# Patient Record
Sex: Male | Born: 1945 | Race: White | Hispanic: No | Marital: Married | Smoking: Never smoker
Health system: Southern US, Community
[De-identification: ages and names within clinical notes are randomized; demographics above are authoritative.]

## PROBLEM LIST (undated history)

## (undated) DIAGNOSIS — C61 Malignant neoplasm of prostate: Secondary | ICD-10-CM

## (undated) DIAGNOSIS — I10 Essential (primary) hypertension: Secondary | ICD-10-CM

## (undated) DIAGNOSIS — C7951 Secondary malignant neoplasm of bone: Principal | ICD-10-CM

## (undated) DIAGNOSIS — E119 Type 2 diabetes mellitus without complications: Secondary | ICD-10-CM

## (undated) DIAGNOSIS — G4730 Sleep apnea, unspecified: Secondary | ICD-10-CM

## (undated) DIAGNOSIS — E079 Disorder of thyroid, unspecified: Secondary | ICD-10-CM

## (undated) HISTORY — PX: CATARACT EXTRACTION: SUR2

## (undated) HISTORY — DX: Malignant neoplasm of prostate: C61

## (undated) HISTORY — DX: Essential (primary) hypertension: I10

## (undated) HISTORY — PX: REPLACEMENT TOTAL KNEE: SUR1224

## (undated) HISTORY — DX: Secondary malignant neoplasm of bone: C79.51

## (undated) HISTORY — DX: Type 2 diabetes mellitus without complications: E11.9

## (undated) HISTORY — DX: Sleep apnea, unspecified: G47.30

## (undated) HISTORY — DX: Disorder of thyroid, unspecified: E07.9

---

## 2010-09-27 ENCOUNTER — Encounter (HOSPITAL_COMMUNITY): Payer: BC Managed Care – PPO

## 2010-09-27 ENCOUNTER — Other Ambulatory Visit: Payer: Self-pay | Admitting: Specialist

## 2010-09-27 ENCOUNTER — Other Ambulatory Visit (HOSPITAL_COMMUNITY): Payer: Self-pay | Admitting: Specialist

## 2010-09-27 ENCOUNTER — Ambulatory Visit (HOSPITAL_COMMUNITY)
Admission: RE | Admit: 2010-09-27 | Discharge: 2010-09-27 | Disposition: A | Discharge status: Home / Self Care | Payer: BC Managed Care – PPO | Source: Ambulatory Visit | Attending: Specialist | Admitting: Specialist

## 2010-09-27 DIAGNOSIS — Z01811 Encounter for preprocedural respiratory examination: Secondary | ICD-10-CM

## 2010-09-27 DIAGNOSIS — Z01818 Encounter for other preprocedural examination: Secondary | ICD-10-CM | POA: Insufficient documentation

## 2010-09-27 DIAGNOSIS — I517 Cardiomegaly: Secondary | ICD-10-CM | POA: Insufficient documentation

## 2010-09-27 DIAGNOSIS — Z01810 Encounter for preprocedural cardiovascular examination: Secondary | ICD-10-CM | POA: Insufficient documentation

## 2010-09-27 DIAGNOSIS — Z01812 Encounter for preprocedural laboratory examination: Secondary | ICD-10-CM | POA: Insufficient documentation

## 2010-09-27 DIAGNOSIS — I498 Other specified cardiac arrhythmias: Secondary | ICD-10-CM | POA: Insufficient documentation

## 2010-09-27 DIAGNOSIS — I10 Essential (primary) hypertension: Secondary | ICD-10-CM | POA: Insufficient documentation

## 2010-09-27 LAB — COMPREHENSIVE METABOLIC PANEL
ALT: 19 U/L (ref 0–53)
Albumin: 4 g/dL (ref 3.5–5.2)
Alkaline Phosphatase: 89 U/L (ref 39–117)
BUN: 17 mg/dL (ref 6–23)
Potassium: 3.5 mEq/L (ref 3.5–5.1)
Sodium: 138 mEq/L (ref 135–145)
Total Protein: 7.2 g/dL (ref 6.0–8.3)

## 2010-09-27 LAB — DIFFERENTIAL
Basophils Relative: 1 % (ref 0–1)
Lymphocytes Relative: 29 % (ref 12–46)
Monocytes Absolute: 0.4 10*3/uL (ref 0.1–1.0)
Monocytes Relative: 6 % (ref 3–12)
Neutro Abs: 4 10*3/uL (ref 1.7–7.7)

## 2010-09-27 LAB — URINALYSIS, ROUTINE W REFLEX MICROSCOPIC
Bilirubin Urine: NEGATIVE
Hgb urine dipstick: NEGATIVE
Specific Gravity, Urine: 1.016 (ref 1.005–1.030)
pH: 7.5 (ref 5.0–8.0)

## 2010-09-27 LAB — CBC
HCT: 44.5 % (ref 39.0–52.0)
Hemoglobin: 14.8 g/dL (ref 13.0–17.0)
MCHC: 33.3 g/dL (ref 30.0–36.0)

## 2010-09-27 LAB — SURGICAL PCR SCREEN: MRSA, PCR: NEGATIVE

## 2010-09-27 LAB — PROTIME-INR: INR: 1.04 (ref 0.00–1.49)

## 2010-10-10 ENCOUNTER — Inpatient Hospital Stay (HOSPITAL_COMMUNITY)
Admission: RE | Admit: 2010-10-10 | Discharge: 2010-10-13 | DRG: 209 | Disposition: A | Discharge status: Home Health | Payer: BC Managed Care – PPO | Source: Ambulatory Visit | Attending: Specialist | Admitting: Specialist

## 2010-10-10 ENCOUNTER — Inpatient Hospital Stay (HOSPITAL_COMMUNITY): Payer: BC Managed Care – PPO

## 2010-10-10 DIAGNOSIS — I10 Essential (primary) hypertension: Secondary | ICD-10-CM | POA: Diagnosis present

## 2010-10-10 DIAGNOSIS — G4733 Obstructive sleep apnea (adult) (pediatric): Secondary | ICD-10-CM | POA: Diagnosis present

## 2010-10-10 DIAGNOSIS — Z01812 Encounter for preprocedural laboratory examination: Secondary | ICD-10-CM

## 2010-10-10 DIAGNOSIS — M1710 Unilateral primary osteoarthritis, unspecified knee: Principal | ICD-10-CM | POA: Diagnosis present

## 2010-10-10 LAB — TYPE AND SCREEN
ABO/RH(D): A POS
Antibody Screen: NEGATIVE

## 2010-10-11 LAB — BASIC METABOLIC PANEL
Calcium: 9 mg/dL (ref 8.4–10.5)
GFR calc non Af Amer: >60 mL/min (ref >60)
Glucose, Bld: 125 mg/dL — ABNORMAL HIGH (ref 70–99)
Sodium: 134 mEq/L — ABNORMAL LOW (ref 135–145)

## 2010-10-11 LAB — CBC
Hemoglobin: 14.5 g/dL (ref 13.0–17.0)
MCH: 31.1 pg (ref 26.0–34.0)
MCHC: 34.9 g/dL (ref 30.0–36.0)
RDW: 13 % (ref 11.5–15.5)

## 2010-10-12 LAB — CBC
MCH: 30.8 pg (ref 26.0–34.0)
MCHC: 34.2 g/dL (ref 30.0–36.0)
Platelets: 168 10*3/uL (ref 150–400)
RBC: 4.39 MIL/uL (ref 4.22–5.81)

## 2010-10-12 LAB — BASIC METABOLIC PANEL
Calcium: 9.3 mg/dL (ref 8.4–10.5)
GFR calc non Af Amer: >60 mL/min (ref >60)
Sodium: 135 mEq/L (ref 135–145)

## 2010-10-12 LAB — GLUCOSE, CAPILLARY
Glucose-Capillary: 133 mg/dL — ABNORMAL HIGH (ref 70–99)
Glucose-Capillary: 159 mg/dL — ABNORMAL HIGH (ref 70–99)

## 2010-10-13 LAB — BASIC METABOLIC PANEL
Calcium: 9.7 mg/dL (ref 8.4–10.5)
GFR calc non Af Amer: >60 mL/min (ref >60)
Sodium: 137 mEq/L (ref 135–145)

## 2010-10-13 LAB — GLUCOSE, CAPILLARY: Glucose-Capillary: 130 mg/dL — ABNORMAL HIGH (ref 70–99)

## 2010-10-13 LAB — CBC
MCH: 31.5 pg (ref 26.0–34.0)
MCHC: 35.4 g/dL (ref 30.0–36.0)
Platelets: 177 10*3/uL (ref 150–400)
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
## 2010-10-15 NOTE — Op Note (Signed)
NAME:  Micheal Davis, Micheal Davis NO.:  [id number]  MEDICAL RECORD NO.:  [id number]  LOCATION:  1614                         FACILITY:  [facility]  PHYSICIAN:  Jene Every, M.D.    DATE OF BIRTH:  January 13, 1946  DATE OF PROCEDURE: DATE OF DISCHARGE:                              OPERATIVE REPORT   PREOPERATIVE DIAGNOSIS:  Degenerative joint disease of the left knee.  POSTOPERATIVE DIAGNOSIS:  Degenerative joint disease of the left knee.  PROCEDURE PERFORMED:  Left total knee arthroplasty.  ANESTHESIA:  General.  ASSISTANT:  Roma Schanz, PA  BRIEF HISTORY AND INDICATION:  This is a 65 year old with end-stage osteoarthritis of the knee, indicated for replacement of degenerated joint.  Risks and benefits were discussed, including bleeding, infection, damage to neurovascular structures, no change in symptoms, worsening symptoms, need for repeat debridement, DVT, PE, anesthetic complications, etc.  COMPONENTS UTILIZED:  DePuy rotating platform, 4 femur, 5 tibia, 10 mm insert, 38 patella.  TECHNIQUE:  With the patient in supine position, after induction of adequate general anesthesia, 2 g Kefzol, left lower extremity was prepped and draped and exsanguinated in usual sterile fashion.  Thigh tourniquet inflated to 300 mmHg.  Midline incision was made over the patella.  Median parapatellar arthrotomy was performed.  Full-thickness flaps were developed, patella everted, knee flexed, tricompartmental osteoarthrosis was noted.  Copious portions of clear synovial fluid were evacuated.  Osteophytes removed with rongeur.  Medial lateral menisci was removed.  We elevated soft tissues medially preserving the MCL.  I removed the remnant of the ACL.  Cauterized the geniculate, step drill utilized, anterior of the femoral canal was irrigated.  We chose a 12 mm dorsiflexion contracture, 5 degrees left, inserted at 10, and made a distal femoral cut without difficulty.  Soft tissues  protected throughout the case.  Attention turned towards the tibia, subluxed, McHale was utilized.  External alignment guide utilized bisecting the ankle parallel to the tibial shaft, 10 mm off the high side which was too off the low side which was medial.  This was then pinned, oscillating saw performed the tibial cut.  Soft tissues protected posteriorly at all times.  Next, we trialed a flexion and extension gaps and they were equivalent.  Attention was turned towards the bleeding, the tibia was subluxed.  McHale retractors placed, a 5 was better coverage than the 4 on the medial side of the tibial tubercle.  That was optimized the coverage, 10 central drill and punch guide utilized, this was then preserved.  Attention was turned towards the femur, completed the box cut with a box jig bisecting the canal.  __________ which was then thinned, box cut performed.  A trial femur was then impacted, the 10 mm insert full extension, full flexion, good stability with varus- valgus stressing at 0 to 30 degrees, patella was then prepared, everted, measured, 27 to 10 off the patella measured 38, left at 17 use of patellar clamp.  Oscillating saw performed with this, then we medialized with 3 peg holes, drilled them, placed a trial patella, and trialed the patella with good patellofemoral tracking, good stability.  Next, all of instrumentation was removed.  We checked posteriorly, removed any osteophytes.  A loose body was achieved as well, cauterized the geniculate.  Popliteus was intact, as was the capsule.  Copiously irrigated with pulsatile lavage.  Knee was flexed, patella everted using McHale.  Dried the surface of the tibia, mixed cement at the back table in appropriate fashion, injected under pressure in the tibial canal digitally pressurized the proximal tibia, and then impacted the 5 tibia. Redundant cement was removed.  We then dried and then cemented the femur, impacted the femoral  component.  Redundant cement removed.  We inserted 10 mm insert to reduce the knee and held this in extension with an axial load applied throughout the curing of the cement.  Redundant cement removed.  Cemented the patella as well with using a clamp of the patella.  Redundant cement removed.  After full curing of the cement, it was trialed, flexion extension, full extension, full flexion, good stability, varus-valgus stressing at 0 and 30 degrees.  Negative anterior drawer at the patellofemoral tracking with the 10 trial. Inspected the joint, removed all redundant cement, copiously irrigated. Placed 10 mm insert, reduced it again, good stability, full range of motion 0-140 with varus-valgus stressing at 0 and 30 degrees. Patellofemoral tracking was satisfactory.  Next, injected Marcaine with epinephrine into periosteum.  We placed a Hemovac, brought out through the lateral stab wound of the skin.  Repaired the patellar arthrotomy of the knee with slight flexion 30 degrees with #1 Vicryl interrupted figure-of-8 sutures, subcu with 0 and 2-0 Vicryl __________ , skin was reapproximated with staples.  Wound was dressed sterilely, tourniquet was deflated with adequate vascularization of lower extremities appreciated.  The patient tolerated the procedure well.  No complications such as increasing stridor.  Tourniquet time was around hour and 45 minutes.  We used a bone wax for cancellous surfaces as well.     Jene Every, M.D.     Cordelia Pen  D:  10/10/2010  T:  10/10/2010  Job:  [id number]  Electronically Signed by Jene Every M.D. on 10/15/2010 01:24:34 PM

## 2010-11-01 NOTE — Discharge Summary (Signed)
NAME:  Micheal Davis, Micheal Davis NO.:  [id number]  MEDICAL RECORD NO.:  [id number]  LOCATION:  1614                         FACILITY:  [facility]  PHYSICIAN:  Jene Every, M.D.    DATE OF BIRTH:  01-18-1946  DATE OF ADMISSION:  10/10/2010 DATE OF DISCHARGE:  10/13/2010                              DISCHARGE SUMMARY   ADMISSION DIAGNOSES: 1. Degenerative joint disease, left knee. 2. Hypertension. 3. Sleep apnea. 4. History of kidney stones. 5. History of questionable benign prostatic hypertrophy.  DISCHARGE DIAGNOSES: 1. Degenerative joint disease, left knee. 2. Hypertension. 3. Sleep apnea. 4. History of kidney stones. 5. History of questionable benign prostatic hypertrophy. 6. Status post left total knee arthroplasty.  HISTORY:  Micheal Davis is a pleasant 65 year old gentleman with a known history of end-stage osteoarthritis of knees who has been treated conservatively with injections, viscosupplementation, cortisone, as well as antiinflammatories.  He has noted significant progression of his symptoms with difficulty weightbearing.  X-rays do show end-stage changes of the medial compartment.  Exam does show loss of range of motion as well as some instability on varus stressing.  At this point, it was felt the patient would benefit from a total knee arthroplasty. Risks and benefits of the surgery were discussed with the patient.  He does elect to proceed.  PROCEDURE:  The patient was taken to the OR and underwent left total knee arthroplasty.  SURGEON:  Jene Every, M.D.  ASSISTANT:  Roma Schanz, P.A-C.  ANESTHESIA:  General.  COMPLICATIONS:  None.  CONSULTS:  PT, OT, and Care Management.  LABORATORY DATA ON ADMISSION:  White cell count 10.1, hemoglobin 14.5, and hematocrit 41.6.  This was monitored throughout the hospital stay, slightly elevated WBC at 10.7, hemoglobin remained stable at 14.1, and hematocrit at 39.8.  Routine chemistries showed  sodium 134, potassium 4.4, normal BUN and creatinine, slightly elevated glucose at 125. Sodium remained stable throughout the hospital course.  Slight decrease in potassium 3.3 at the time of discharge.  BUN 13, creatinine 0.84, slightly elevated glucose at 128.  GFR is greater than 60, blood type is A+.  No preoperative chest x-ray or scans in the chart.  HOSPITAL COURSE:  The patient was admitted, taken to the OR, and underwent the above-stated procedure without difficulty.  He was then transferred to the PACU and admitted to the orthopedic floor  for continued postoperative care.  One Hemovac drain was placed intraoperatively.  He was placed on PCA for analgesic relief.  On postoperative day #1, the patient was doing fairly well.  Vital signs were stable.  He was afebrile.  Hemovac drain was discontinued.  His glucose was slightly elevated.  Dr. Shelle Iron recommended a nutrition consult, CPM was started as well as Xarelto for DVT prophylaxis.  We continued with elevation as well as TED hose and PAS.  Postoperative day #2, the patient continued do fairly well, noted pain in the knee, as expected, was controlled on p.o. analgesics.  He was voiding, passing flatus without difficulty.  Vital signs were stable and he was afebrile. Dressing was changed.  There was some mild edema into the lower extremity, otherwise, motor and neurovascular function are intact. Calves  soft and nontender.  PT/OT was continued.  Discharge planning was initiated.  Postop day #3,patient was doing well at this time, the  patient was stable to be discharged home.  DISPOSITION:  The patient discharged home with all home health needs met.  ACTIVITY:  He is to elevate his leg  at least 6 times a day for 20 minutes at a time.  Ambulate as tolerated.  Use knee immobilizer until he can straight leg raise x10.  MEDICATIONS:  As per med rec sheet.  He will need  to be on Xarelto for a total of 21 days.  DIET:  As  tolerated.  CONDITION ON DISCHARGE:  Stable.  FINAL DIAGNOSIS:  Doing well status post left total knee arthroplasty.     Roma Schanz, P.A.   ______________________________ Jene Every, M.D.    CS/MEDQ  D:  10/30/2010  T:  10/31/2010  Job:  [id number]  Electronically Signed by Roma Schanz P.A. on 11/01/2010 12:13:32 PM Electronically Signed by Jene Every M.D. on 11/01/2010 02:42:01 PM

## 2011-03-09 DIAGNOSIS — H66009 Acute suppurative otitis media without spontaneous rupture of ear drum, unspecified ear: Secondary | ICD-10-CM | POA: Diagnosis not present

## 2011-03-10 DIAGNOSIS — M1710 Unilateral primary osteoarthritis, unspecified knee: Secondary | ICD-10-CM | POA: Diagnosis not present

## 2011-03-16 DIAGNOSIS — H66009 Acute suppurative otitis media without spontaneous rupture of ear drum, unspecified ear: Secondary | ICD-10-CM | POA: Diagnosis not present

## 2011-03-16 DIAGNOSIS — R42 Dizziness and giddiness: Secondary | ICD-10-CM | POA: Diagnosis not present

## 2011-03-16 DIAGNOSIS — H6500 Acute serous otitis media, unspecified ear: Secondary | ICD-10-CM | POA: Diagnosis not present

## 2011-06-06 DIAGNOSIS — M20009 Unspecified deformity of unspecified finger(s): Secondary | ICD-10-CM | POA: Diagnosis not present

## 2011-06-06 DIAGNOSIS — M1990 Unspecified osteoarthritis, unspecified site: Secondary | ICD-10-CM | POA: Diagnosis not present

## 2011-08-04 DIAGNOSIS — E7800 Pure hypercholesterolemia, unspecified: Secondary | ICD-10-CM | POA: Diagnosis not present

## 2011-08-11 DIAGNOSIS — G4733 Obstructive sleep apnea (adult) (pediatric): Secondary | ICD-10-CM | POA: Diagnosis not present

## 2011-08-11 DIAGNOSIS — N400 Enlarged prostate without lower urinary tract symptoms: Secondary | ICD-10-CM | POA: Diagnosis not present

## 2011-08-11 DIAGNOSIS — M1990 Unspecified osteoarthritis, unspecified site: Secondary | ICD-10-CM | POA: Diagnosis not present

## 2011-08-11 DIAGNOSIS — R972 Elevated prostate specific antigen [PSA]: Secondary | ICD-10-CM | POA: Diagnosis not present

## 2011-08-11 DIAGNOSIS — R7301 Impaired fasting glucose: Secondary | ICD-10-CM | POA: Diagnosis not present

## 2011-08-11 DIAGNOSIS — E039 Hypothyroidism, unspecified: Secondary | ICD-10-CM | POA: Diagnosis not present

## 2011-08-11 DIAGNOSIS — E782 Mixed hyperlipidemia: Secondary | ICD-10-CM | POA: Diagnosis not present

## 2011-08-11 DIAGNOSIS — I10 Essential (primary) hypertension: Secondary | ICD-10-CM | POA: Diagnosis not present

## 2011-09-18 DIAGNOSIS — M1710 Unilateral primary osteoarthritis, unspecified knee: Secondary | ICD-10-CM | POA: Diagnosis not present

## 2011-10-12 DIAGNOSIS — R070 Pain in throat: Secondary | ICD-10-CM | POA: Diagnosis not present

## 2011-10-12 DIAGNOSIS — J019 Acute sinusitis, unspecified: Secondary | ICD-10-CM | POA: Diagnosis not present

## 2011-10-12 DIAGNOSIS — L02818 Cutaneous abscess of other sites: Secondary | ICD-10-CM | POA: Diagnosis not present

## 2012-01-21 DIAGNOSIS — E782 Mixed hyperlipidemia: Secondary | ICD-10-CM | POA: Diagnosis not present

## 2012-01-21 DIAGNOSIS — G252 Other specified forms of tremor: Secondary | ICD-10-CM | POA: Diagnosis not present

## 2012-01-21 DIAGNOSIS — R7301 Impaired fasting glucose: Secondary | ICD-10-CM | POA: Diagnosis not present

## 2012-01-21 DIAGNOSIS — H8110 Benign paroxysmal vertigo, unspecified ear: Secondary | ICD-10-CM | POA: Diagnosis not present

## 2012-01-21 DIAGNOSIS — N400 Enlarged prostate without lower urinary tract symptoms: Secondary | ICD-10-CM | POA: Diagnosis not present

## 2012-01-21 DIAGNOSIS — G250 Essential tremor: Secondary | ICD-10-CM | POA: Diagnosis not present

## 2012-01-21 DIAGNOSIS — E039 Hypothyroidism, unspecified: Secondary | ICD-10-CM | POA: Diagnosis not present

## 2012-01-21 DIAGNOSIS — C44319 Basal cell carcinoma of skin of other parts of face: Secondary | ICD-10-CM | POA: Diagnosis not present

## 2012-01-21 DIAGNOSIS — I10 Essential (primary) hypertension: Secondary | ICD-10-CM | POA: Diagnosis not present

## 2012-02-02 DIAGNOSIS — E782 Mixed hyperlipidemia: Secondary | ICD-10-CM | POA: Diagnosis not present

## 2012-02-09 DIAGNOSIS — E782 Mixed hyperlipidemia: Secondary | ICD-10-CM | POA: Diagnosis not present

## 2012-02-09 DIAGNOSIS — Z0000 Encounter for general adult medical examination without abnormal findings: Secondary | ICD-10-CM | POA: Diagnosis not present

## 2012-02-09 DIAGNOSIS — I10 Essential (primary) hypertension: Secondary | ICD-10-CM | POA: Diagnosis not present

## 2012-02-09 DIAGNOSIS — N400 Enlarged prostate without lower urinary tract symptoms: Secondary | ICD-10-CM | POA: Diagnosis not present

## 2012-02-09 DIAGNOSIS — M1990 Unspecified osteoarthritis, unspecified site: Secondary | ICD-10-CM | POA: Diagnosis not present

## 2012-02-09 DIAGNOSIS — E039 Hypothyroidism, unspecified: Secondary | ICD-10-CM | POA: Diagnosis not present

## 2012-02-09 DIAGNOSIS — Z23 Encounter for immunization: Secondary | ICD-10-CM | POA: Diagnosis not present

## 2012-02-09 DIAGNOSIS — G4733 Obstructive sleep apnea (adult) (pediatric): Secondary | ICD-10-CM | POA: Diagnosis not present

## 2012-03-01 DIAGNOSIS — L570 Actinic keratosis: Secondary | ICD-10-CM | POA: Diagnosis not present

## 2012-03-01 DIAGNOSIS — D485 Neoplasm of uncertain behavior of skin: Secondary | ICD-10-CM | POA: Diagnosis not present

## 2012-03-01 DIAGNOSIS — L820 Inflamed seborrheic keratosis: Secondary | ICD-10-CM | POA: Diagnosis not present

## 2012-03-01 DIAGNOSIS — L821 Other seborrheic keratosis: Secondary | ICD-10-CM | POA: Diagnosis not present

## 2012-06-23 DIAGNOSIS — J309 Allergic rhinitis, unspecified: Secondary | ICD-10-CM | POA: Diagnosis not present

## 2012-06-23 DIAGNOSIS — I10 Essential (primary) hypertension: Secondary | ICD-10-CM | POA: Diagnosis not present

## 2012-06-23 DIAGNOSIS — R7301 Impaired fasting glucose: Secondary | ICD-10-CM | POA: Diagnosis not present

## 2012-06-23 DIAGNOSIS — L2089 Other atopic dermatitis: Secondary | ICD-10-CM | POA: Diagnosis not present

## 2012-07-31 DIAGNOSIS — J029 Acute pharyngitis, unspecified: Secondary | ICD-10-CM | POA: Diagnosis not present

## 2012-08-23 DIAGNOSIS — E782 Mixed hyperlipidemia: Secondary | ICD-10-CM | POA: Diagnosis not present

## 2012-08-23 DIAGNOSIS — R7301 Impaired fasting glucose: Secondary | ICD-10-CM | POA: Diagnosis not present

## 2012-08-23 DIAGNOSIS — R39198 Other difficulties with micturition: Secondary | ICD-10-CM | POA: Diagnosis not present

## 2012-08-23 DIAGNOSIS — R3915 Urgency of urination: Secondary | ICD-10-CM | POA: Diagnosis not present

## 2012-08-23 DIAGNOSIS — E039 Hypothyroidism, unspecified: Secondary | ICD-10-CM | POA: Diagnosis not present

## 2012-08-23 DIAGNOSIS — R350 Frequency of micturition: Secondary | ICD-10-CM | POA: Diagnosis not present

## 2012-08-23 DIAGNOSIS — J309 Allergic rhinitis, unspecified: Secondary | ICD-10-CM | POA: Diagnosis not present

## 2012-08-23 DIAGNOSIS — N400 Enlarged prostate without lower urinary tract symptoms: Secondary | ICD-10-CM | POA: Diagnosis not present

## 2012-08-23 DIAGNOSIS — I10 Essential (primary) hypertension: Secondary | ICD-10-CM | POA: Diagnosis not present

## 2012-08-23 DIAGNOSIS — R972 Elevated prostate specific antigen [PSA]: Secondary | ICD-10-CM | POA: Diagnosis not present

## 2012-08-23 DIAGNOSIS — G4733 Obstructive sleep apnea (adult) (pediatric): Secondary | ICD-10-CM | POA: Diagnosis not present

## 2012-08-26 DIAGNOSIS — R1084 Generalized abdominal pain: Secondary | ICD-10-CM | POA: Diagnosis not present

## 2012-08-26 DIAGNOSIS — K5900 Constipation, unspecified: Secondary | ICD-10-CM | POA: Diagnosis not present

## 2012-08-26 DIAGNOSIS — M545 Low back pain: Secondary | ICD-10-CM | POA: Diagnosis not present

## 2012-08-30 DIAGNOSIS — M1710 Unilateral primary osteoarthritis, unspecified knee: Secondary | ICD-10-CM | POA: Diagnosis not present

## 2012-09-21 DIAGNOSIS — M545 Low back pain: Secondary | ICD-10-CM | POA: Diagnosis not present

## 2012-09-21 DIAGNOSIS — R1084 Generalized abdominal pain: Secondary | ICD-10-CM | POA: Diagnosis not present

## 2012-09-21 DIAGNOSIS — R748 Abnormal levels of other serum enzymes: Secondary | ICD-10-CM | POA: Diagnosis not present

## 2012-09-21 DIAGNOSIS — R634 Abnormal weight loss: Secondary | ICD-10-CM | POA: Diagnosis not present

## 2012-09-23 DIAGNOSIS — K7689 Other specified diseases of liver: Secondary | ICD-10-CM | POA: Diagnosis not present

## 2012-09-23 DIAGNOSIS — R109 Unspecified abdominal pain: Secondary | ICD-10-CM | POA: Diagnosis not present

## 2012-09-23 DIAGNOSIS — R1084 Generalized abdominal pain: Secondary | ICD-10-CM | POA: Diagnosis not present

## 2012-09-23 DIAGNOSIS — K8020 Calculus of gallbladder without cholecystitis without obstruction: Secondary | ICD-10-CM | POA: Diagnosis not present

## 2012-09-30 DIAGNOSIS — R634 Abnormal weight loss: Secondary | ICD-10-CM | POA: Diagnosis not present

## 2012-09-30 DIAGNOSIS — R238 Other skin changes: Secondary | ICD-10-CM | POA: Diagnosis not present

## 2012-09-30 DIAGNOSIS — R1084 Generalized abdominal pain: Secondary | ICD-10-CM | POA: Diagnosis not present

## 2012-09-30 DIAGNOSIS — M545 Low back pain: Secondary | ICD-10-CM | POA: Diagnosis not present

## 2012-09-30 DIAGNOSIS — R350 Frequency of micturition: Secondary | ICD-10-CM | POA: Diagnosis not present

## 2012-09-30 DIAGNOSIS — R3915 Urgency of urination: Secondary | ICD-10-CM | POA: Diagnosis not present

## 2012-09-30 DIAGNOSIS — R748 Abnormal levels of other serum enzymes: Secondary | ICD-10-CM | POA: Diagnosis not present

## 2012-09-30 DIAGNOSIS — C61 Malignant neoplasm of prostate: Secondary | ICD-10-CM | POA: Diagnosis not present

## 2012-09-30 DIAGNOSIS — R972 Elevated prostate specific antigen [PSA]: Secondary | ICD-10-CM | POA: Diagnosis not present

## 2012-09-30 DIAGNOSIS — IMO0001 Reserved for inherently not codable concepts without codable children: Secondary | ICD-10-CM | POA: Diagnosis not present

## 2012-09-30 DIAGNOSIS — R39198 Other difficulties with micturition: Secondary | ICD-10-CM | POA: Diagnosis not present

## 2012-10-04 DIAGNOSIS — K7689 Other specified diseases of liver: Secondary | ICD-10-CM | POA: Diagnosis not present

## 2012-10-04 DIAGNOSIS — K8020 Calculus of gallbladder without cholecystitis without obstruction: Secondary | ICD-10-CM | POA: Diagnosis not present

## 2012-10-04 DIAGNOSIS — R972 Elevated prostate specific antigen [PSA]: Secondary | ICD-10-CM | POA: Diagnosis not present

## 2012-10-04 DIAGNOSIS — M899 Disorder of bone, unspecified: Secondary | ICD-10-CM | POA: Diagnosis not present

## 2012-10-04 DIAGNOSIS — M949 Disorder of cartilage, unspecified: Secondary | ICD-10-CM | POA: Diagnosis not present

## 2012-10-06 DIAGNOSIS — R634 Abnormal weight loss: Secondary | ICD-10-CM | POA: Diagnosis not present

## 2012-10-06 DIAGNOSIS — R748 Abnormal levels of other serum enzymes: Secondary | ICD-10-CM | POA: Diagnosis not present

## 2012-10-06 DIAGNOSIS — R1084 Generalized abdominal pain: Secondary | ICD-10-CM | POA: Diagnosis not present

## 2012-10-06 DIAGNOSIS — M545 Low back pain: Secondary | ICD-10-CM | POA: Diagnosis not present

## 2012-10-08 DIAGNOSIS — C61 Malignant neoplasm of prostate: Secondary | ICD-10-CM | POA: Diagnosis not present

## 2012-10-15 DIAGNOSIS — R948 Abnormal results of function studies of other organs and systems: Secondary | ICD-10-CM | POA: Diagnosis not present

## 2012-10-15 DIAGNOSIS — C61 Malignant neoplasm of prostate: Secondary | ICD-10-CM | POA: Diagnosis not present

## 2012-10-15 DIAGNOSIS — C7952 Secondary malignant neoplasm of bone marrow: Secondary | ICD-10-CM | POA: Diagnosis not present

## 2012-10-15 DIAGNOSIS — C7951 Secondary malignant neoplasm of bone: Secondary | ICD-10-CM | POA: Diagnosis not present

## 2012-10-18 DIAGNOSIS — C61 Malignant neoplasm of prostate: Secondary | ICD-10-CM | POA: Diagnosis not present

## 2012-10-18 DIAGNOSIS — C7951 Secondary malignant neoplasm of bone: Secondary | ICD-10-CM | POA: Diagnosis not present

## 2012-10-20 DIAGNOSIS — C61 Malignant neoplasm of prostate: Secondary | ICD-10-CM | POA: Diagnosis not present

## 2012-10-20 DIAGNOSIS — C7951 Secondary malignant neoplasm of bone: Secondary | ICD-10-CM | POA: Diagnosis not present

## 2012-11-02 DIAGNOSIS — G4730 Sleep apnea, unspecified: Secondary | ICD-10-CM | POA: Diagnosis not present

## 2012-11-02 DIAGNOSIS — E785 Hyperlipidemia, unspecified: Secondary | ICD-10-CM | POA: Diagnosis not present

## 2012-11-02 DIAGNOSIS — Z96659 Presence of unspecified artificial knee joint: Secondary | ICD-10-CM | POA: Diagnosis not present

## 2012-11-02 DIAGNOSIS — Z7982 Long term (current) use of aspirin: Secondary | ICD-10-CM | POA: Diagnosis not present

## 2012-11-02 DIAGNOSIS — I10 Essential (primary) hypertension: Secondary | ICD-10-CM | POA: Diagnosis not present

## 2012-11-02 DIAGNOSIS — R932 Abnormal findings on diagnostic imaging of liver and biliary tract: Secondary | ICD-10-CM | POA: Diagnosis not present

## 2012-11-02 DIAGNOSIS — Z87442 Personal history of urinary calculi: Secondary | ICD-10-CM | POA: Diagnosis not present

## 2012-11-02 DIAGNOSIS — C7951 Secondary malignant neoplasm of bone: Secondary | ICD-10-CM | POA: Diagnosis not present

## 2012-11-02 DIAGNOSIS — C61 Malignant neoplasm of prostate: Secondary | ICD-10-CM | POA: Diagnosis not present

## 2012-11-02 DIAGNOSIS — Z79899 Other long term (current) drug therapy: Secondary | ICD-10-CM | POA: Diagnosis not present

## 2012-12-03 DIAGNOSIS — C7951 Secondary malignant neoplasm of bone: Secondary | ICD-10-CM

## 2012-12-03 DIAGNOSIS — F411 Generalized anxiety disorder: Secondary | ICD-10-CM

## 2012-12-03 DIAGNOSIS — D649 Anemia, unspecified: Secondary | ICD-10-CM | POA: Diagnosis not present

## 2012-12-03 DIAGNOSIS — C7952 Secondary malignant neoplasm of bone marrow: Secondary | ICD-10-CM

## 2012-12-03 DIAGNOSIS — C61 Malignant neoplasm of prostate: Secondary | ICD-10-CM

## 2012-12-14 DIAGNOSIS — C7952 Secondary malignant neoplasm of bone marrow: Secondary | ICD-10-CM

## 2012-12-14 DIAGNOSIS — D649 Anemia, unspecified: Secondary | ICD-10-CM | POA: Diagnosis not present

## 2012-12-14 DIAGNOSIS — F411 Generalized anxiety disorder: Secondary | ICD-10-CM | POA: Diagnosis not present

## 2012-12-14 DIAGNOSIS — C61 Malignant neoplasm of prostate: Secondary | ICD-10-CM

## 2012-12-14 DIAGNOSIS — C7951 Secondary malignant neoplasm of bone: Secondary | ICD-10-CM

## 2012-12-14 DIAGNOSIS — Z23 Encounter for immunization: Secondary | ICD-10-CM

## 2012-12-17 DIAGNOSIS — R05 Cough: Secondary | ICD-10-CM | POA: Diagnosis not present

## 2012-12-17 DIAGNOSIS — J019 Acute sinusitis, unspecified: Secondary | ICD-10-CM | POA: Diagnosis not present

## 2013-01-18 DIAGNOSIS — D649 Anemia, unspecified: Secondary | ICD-10-CM | POA: Diagnosis not present

## 2013-01-18 DIAGNOSIS — C7952 Secondary malignant neoplasm of bone marrow: Secondary | ICD-10-CM

## 2013-01-18 DIAGNOSIS — C61 Malignant neoplasm of prostate: Secondary | ICD-10-CM

## 2013-01-18 DIAGNOSIS — M19049 Primary osteoarthritis, unspecified hand: Secondary | ICD-10-CM | POA: Diagnosis not present

## 2013-01-18 DIAGNOSIS — C7951 Secondary malignant neoplasm of bone: Secondary | ICD-10-CM

## 2013-01-24 DIAGNOSIS — C7951 Secondary malignant neoplasm of bone: Secondary | ICD-10-CM

## 2013-01-24 DIAGNOSIS — C7952 Secondary malignant neoplasm of bone marrow: Secondary | ICD-10-CM

## 2013-01-24 DIAGNOSIS — Z5111 Encounter for antineoplastic chemotherapy: Secondary | ICD-10-CM

## 2013-01-24 DIAGNOSIS — C61 Malignant neoplasm of prostate: Secondary | ICD-10-CM

## 2013-02-14 DIAGNOSIS — E039 Hypothyroidism, unspecified: Secondary | ICD-10-CM | POA: Diagnosis not present

## 2013-02-14 DIAGNOSIS — I10 Essential (primary) hypertension: Secondary | ICD-10-CM | POA: Diagnosis not present

## 2013-02-14 DIAGNOSIS — E782 Mixed hyperlipidemia: Secondary | ICD-10-CM | POA: Diagnosis not present

## 2013-02-14 DIAGNOSIS — R7301 Impaired fasting glucose: Secondary | ICD-10-CM | POA: Diagnosis not present

## 2013-02-21 DIAGNOSIS — E782 Mixed hyperlipidemia: Secondary | ICD-10-CM | POA: Diagnosis not present

## 2013-02-21 DIAGNOSIS — R748 Abnormal levels of other serum enzymes: Secondary | ICD-10-CM | POA: Diagnosis not present

## 2013-02-21 DIAGNOSIS — E039 Hypothyroidism, unspecified: Secondary | ICD-10-CM | POA: Diagnosis not present

## 2013-02-21 DIAGNOSIS — G4733 Obstructive sleep apnea (adult) (pediatric): Secondary | ICD-10-CM | POA: Diagnosis not present

## 2013-02-21 DIAGNOSIS — C61 Malignant neoplasm of prostate: Secondary | ICD-10-CM | POA: Diagnosis not present

## 2013-02-21 DIAGNOSIS — I10 Essential (primary) hypertension: Secondary | ICD-10-CM | POA: Diagnosis not present

## 2013-02-21 DIAGNOSIS — M1990 Unspecified osteoarthritis, unspecified site: Secondary | ICD-10-CM | POA: Diagnosis not present

## 2013-02-21 DIAGNOSIS — R7301 Impaired fasting glucose: Secondary | ICD-10-CM | POA: Diagnosis not present

## 2013-02-21 DIAGNOSIS — D630 Anemia in neoplastic disease: Secondary | ICD-10-CM | POA: Diagnosis not present

## 2013-03-05 DIAGNOSIS — J111 Influenza due to unidentified influenza virus with other respiratory manifestations: Secondary | ICD-10-CM | POA: Diagnosis not present

## 2013-03-21 DIAGNOSIS — C801 Malignant (primary) neoplasm, unspecified: Secondary | ICD-10-CM | POA: Diagnosis not present

## 2013-03-21 DIAGNOSIS — C61 Malignant neoplasm of prostate: Secondary | ICD-10-CM | POA: Diagnosis not present

## 2013-03-21 DIAGNOSIS — D630 Anemia in neoplastic disease: Secondary | ICD-10-CM | POA: Diagnosis not present

## 2013-04-18 DIAGNOSIS — C800 Disseminated malignant neoplasm, unspecified: Secondary | ICD-10-CM | POA: Diagnosis not present

## 2013-04-18 DIAGNOSIS — D630 Anemia in neoplastic disease: Secondary | ICD-10-CM | POA: Diagnosis not present

## 2013-04-18 DIAGNOSIS — C61 Malignant neoplasm of prostate: Secondary | ICD-10-CM | POA: Diagnosis not present

## 2013-04-18 DIAGNOSIS — C801 Malignant (primary) neoplasm, unspecified: Secondary | ICD-10-CM | POA: Diagnosis not present

## 2013-05-16 DIAGNOSIS — C7951 Secondary malignant neoplasm of bone: Secondary | ICD-10-CM | POA: Diagnosis not present

## 2013-05-16 DIAGNOSIS — E876 Hypokalemia: Secondary | ICD-10-CM | POA: Diagnosis not present

## 2013-05-16 DIAGNOSIS — C801 Malignant (primary) neoplasm, unspecified: Secondary | ICD-10-CM | POA: Diagnosis not present

## 2013-05-16 DIAGNOSIS — C61 Malignant neoplasm of prostate: Secondary | ICD-10-CM | POA: Diagnosis not present

## 2013-05-16 DIAGNOSIS — D630 Anemia in neoplastic disease: Secondary | ICD-10-CM | POA: Diagnosis not present

## 2013-05-23 DIAGNOSIS — I10 Essential (primary) hypertension: Secondary | ICD-10-CM | POA: Diagnosis not present

## 2013-05-23 DIAGNOSIS — R7301 Impaired fasting glucose: Secondary | ICD-10-CM | POA: Diagnosis not present

## 2013-05-23 DIAGNOSIS — E782 Mixed hyperlipidemia: Secondary | ICD-10-CM | POA: Diagnosis not present

## 2013-05-30 DIAGNOSIS — C61 Malignant neoplasm of prostate: Secondary | ICD-10-CM | POA: Diagnosis not present

## 2013-05-30 DIAGNOSIS — G4733 Obstructive sleep apnea (adult) (pediatric): Secondary | ICD-10-CM | POA: Diagnosis not present

## 2013-05-30 DIAGNOSIS — I10 Essential (primary) hypertension: Secondary | ICD-10-CM | POA: Diagnosis not present

## 2013-05-30 DIAGNOSIS — E782 Mixed hyperlipidemia: Secondary | ICD-10-CM | POA: Diagnosis not present

## 2013-05-30 DIAGNOSIS — M1990 Unspecified osteoarthritis, unspecified site: Secondary | ICD-10-CM | POA: Diagnosis not present

## 2013-05-30 DIAGNOSIS — R7301 Impaired fasting glucose: Secondary | ICD-10-CM | POA: Diagnosis not present

## 2013-05-30 DIAGNOSIS — E039 Hypothyroidism, unspecified: Secondary | ICD-10-CM | POA: Diagnosis not present

## 2013-05-30 DIAGNOSIS — R748 Abnormal levels of other serum enzymes: Secondary | ICD-10-CM | POA: Diagnosis not present

## 2013-06-20 DIAGNOSIS — C7951 Secondary malignant neoplasm of bone: Secondary | ICD-10-CM | POA: Diagnosis not present

## 2013-06-20 DIAGNOSIS — C61 Malignant neoplasm of prostate: Secondary | ICD-10-CM | POA: Diagnosis not present

## 2013-06-20 DIAGNOSIS — D630 Anemia in neoplastic disease: Secondary | ICD-10-CM | POA: Diagnosis not present

## 2013-06-20 DIAGNOSIS — C7952 Secondary malignant neoplasm of bone marrow: Secondary | ICD-10-CM | POA: Diagnosis not present

## 2013-07-18 DIAGNOSIS — D630 Anemia in neoplastic disease: Secondary | ICD-10-CM | POA: Diagnosis not present

## 2013-07-18 DIAGNOSIS — C7951 Secondary malignant neoplasm of bone: Secondary | ICD-10-CM | POA: Diagnosis not present

## 2013-07-18 DIAGNOSIS — C801 Malignant (primary) neoplasm, unspecified: Secondary | ICD-10-CM | POA: Diagnosis not present

## 2013-07-18 DIAGNOSIS — C7952 Secondary malignant neoplasm of bone marrow: Secondary | ICD-10-CM | POA: Diagnosis not present

## 2013-07-18 DIAGNOSIS — C61 Malignant neoplasm of prostate: Secondary | ICD-10-CM | POA: Diagnosis not present

## 2013-08-03 DIAGNOSIS — K047 Periapical abscess without sinus: Secondary | ICD-10-CM | POA: Diagnosis not present

## 2013-08-25 DIAGNOSIS — D630 Anemia in neoplastic disease: Secondary | ICD-10-CM | POA: Diagnosis not present

## 2013-08-25 DIAGNOSIS — C7951 Secondary malignant neoplasm of bone: Secondary | ICD-10-CM | POA: Diagnosis not present

## 2013-08-25 DIAGNOSIS — C61 Malignant neoplasm of prostate: Secondary | ICD-10-CM | POA: Diagnosis not present

## 2013-08-25 DIAGNOSIS — C801 Malignant (primary) neoplasm, unspecified: Secondary | ICD-10-CM | POA: Diagnosis not present

## 2013-09-19 DIAGNOSIS — M6740 Ganglion, unspecified site: Secondary | ICD-10-CM | POA: Diagnosis not present

## 2013-09-19 DIAGNOSIS — C7951 Secondary malignant neoplasm of bone: Secondary | ICD-10-CM | POA: Diagnosis not present

## 2013-09-19 DIAGNOSIS — C801 Malignant (primary) neoplasm, unspecified: Secondary | ICD-10-CM | POA: Diagnosis not present

## 2013-09-19 DIAGNOSIS — M545 Low back pain, unspecified: Secondary | ICD-10-CM | POA: Diagnosis not present

## 2013-09-19 DIAGNOSIS — C61 Malignant neoplasm of prostate: Secondary | ICD-10-CM | POA: Diagnosis not present

## 2013-10-17 DIAGNOSIS — D630 Anemia in neoplastic disease: Secondary | ICD-10-CM | POA: Diagnosis not present

## 2013-10-17 DIAGNOSIS — C801 Malignant (primary) neoplasm, unspecified: Secondary | ICD-10-CM | POA: Diagnosis not present

## 2013-10-17 DIAGNOSIS — C61 Malignant neoplasm of prostate: Secondary | ICD-10-CM | POA: Diagnosis not present

## 2013-10-17 DIAGNOSIS — C7951 Secondary malignant neoplasm of bone: Secondary | ICD-10-CM | POA: Diagnosis not present

## 2013-10-17 DIAGNOSIS — C7952 Secondary malignant neoplasm of bone marrow: Secondary | ICD-10-CM | POA: Diagnosis not present

## 2013-11-14 DIAGNOSIS — C7952 Secondary malignant neoplasm of bone marrow: Secondary | ICD-10-CM | POA: Diagnosis not present

## 2013-11-14 DIAGNOSIS — C801 Malignant (primary) neoplasm, unspecified: Secondary | ICD-10-CM | POA: Diagnosis not present

## 2013-11-14 DIAGNOSIS — C7951 Secondary malignant neoplasm of bone: Secondary | ICD-10-CM | POA: Diagnosis not present

## 2013-11-14 DIAGNOSIS — D630 Anemia in neoplastic disease: Secondary | ICD-10-CM | POA: Diagnosis not present

## 2013-11-14 DIAGNOSIS — C61 Malignant neoplasm of prostate: Secondary | ICD-10-CM | POA: Diagnosis not present

## 2013-11-15 DIAGNOSIS — E782 Mixed hyperlipidemia: Secondary | ICD-10-CM | POA: Diagnosis not present

## 2013-11-15 DIAGNOSIS — E039 Hypothyroidism, unspecified: Secondary | ICD-10-CM | POA: Diagnosis not present

## 2013-11-15 DIAGNOSIS — I10 Essential (primary) hypertension: Secondary | ICD-10-CM | POA: Diagnosis not present

## 2013-11-15 DIAGNOSIS — C61 Malignant neoplasm of prostate: Secondary | ICD-10-CM | POA: Diagnosis not present

## 2013-11-26 DIAGNOSIS — J019 Acute sinusitis, unspecified: Secondary | ICD-10-CM | POA: Diagnosis not present

## 2013-12-05 DIAGNOSIS — C61 Malignant neoplasm of prostate: Secondary | ICD-10-CM | POA: Diagnosis not present

## 2013-12-05 DIAGNOSIS — E782 Mixed hyperlipidemia: Secondary | ICD-10-CM | POA: Diagnosis not present

## 2013-12-05 DIAGNOSIS — Z6837 Body mass index (BMI) 37.0-37.9, adult: Secondary | ICD-10-CM | POA: Diagnosis not present

## 2013-12-05 DIAGNOSIS — Z23 Encounter for immunization: Secondary | ICD-10-CM | POA: Diagnosis not present

## 2013-12-05 DIAGNOSIS — C44310 Basal cell carcinoma of skin of unspecified parts of face: Secondary | ICD-10-CM | POA: Diagnosis not present

## 2013-12-05 DIAGNOSIS — E039 Hypothyroidism, unspecified: Secondary | ICD-10-CM | POA: Diagnosis not present

## 2013-12-05 DIAGNOSIS — G4733 Obstructive sleep apnea (adult) (pediatric): Secondary | ICD-10-CM | POA: Diagnosis not present

## 2013-12-05 DIAGNOSIS — I10 Essential (primary) hypertension: Secondary | ICD-10-CM | POA: Diagnosis not present

## 2013-12-19 DIAGNOSIS — C7951 Secondary malignant neoplasm of bone: Secondary | ICD-10-CM | POA: Diagnosis not present

## 2013-12-19 DIAGNOSIS — C61 Malignant neoplasm of prostate: Secondary | ICD-10-CM | POA: Diagnosis not present

## 2013-12-19 DIAGNOSIS — D630 Anemia in neoplastic disease: Secondary | ICD-10-CM | POA: Diagnosis not present

## 2013-12-29 DIAGNOSIS — H2513 Age-related nuclear cataract, bilateral: Secondary | ICD-10-CM | POA: Diagnosis not present

## 2013-12-29 DIAGNOSIS — H40033 Anatomical narrow angle, bilateral: Secondary | ICD-10-CM | POA: Diagnosis not present

## 2014-01-16 DIAGNOSIS — C61 Malignant neoplasm of prostate: Secondary | ICD-10-CM | POA: Diagnosis not present

## 2014-01-16 DIAGNOSIS — M1711 Unilateral primary osteoarthritis, right knee: Secondary | ICD-10-CM | POA: Insufficient documentation

## 2014-01-16 DIAGNOSIS — C7951 Secondary malignant neoplasm of bone: Secondary | ICD-10-CM | POA: Diagnosis not present

## 2014-01-16 DIAGNOSIS — D630 Anemia in neoplastic disease: Secondary | ICD-10-CM | POA: Diagnosis not present

## 2014-01-18 DIAGNOSIS — M1991 Primary osteoarthritis, unspecified site: Secondary | ICD-10-CM | POA: Diagnosis not present

## 2014-01-18 DIAGNOSIS — M25461 Effusion, right knee: Secondary | ICD-10-CM | POA: Diagnosis not present

## 2014-02-13 DIAGNOSIS — C61 Malignant neoplasm of prostate: Secondary | ICD-10-CM | POA: Diagnosis not present

## 2014-02-13 DIAGNOSIS — C7951 Secondary malignant neoplasm of bone: Secondary | ICD-10-CM | POA: Diagnosis not present

## 2014-03-13 DIAGNOSIS — C61 Malignant neoplasm of prostate: Secondary | ICD-10-CM | POA: Diagnosis not present

## 2014-03-13 DIAGNOSIS — C7951 Secondary malignant neoplasm of bone: Secondary | ICD-10-CM | POA: Diagnosis not present

## 2014-03-13 DIAGNOSIS — K088 Other specified disorders of teeth and supporting structures: Secondary | ICD-10-CM | POA: Diagnosis not present

## 2014-03-13 DIAGNOSIS — M179 Osteoarthritis of knee, unspecified: Secondary | ICD-10-CM | POA: Diagnosis not present

## 2014-03-27 DIAGNOSIS — D225 Melanocytic nevi of trunk: Secondary | ICD-10-CM | POA: Diagnosis not present

## 2014-03-27 DIAGNOSIS — L820 Inflamed seborrheic keratosis: Secondary | ICD-10-CM | POA: Diagnosis not present

## 2014-03-27 DIAGNOSIS — L570 Actinic keratosis: Secondary | ICD-10-CM | POA: Diagnosis not present

## 2014-03-27 DIAGNOSIS — L821 Other seborrheic keratosis: Secondary | ICD-10-CM | POA: Diagnosis not present

## 2014-03-27 DIAGNOSIS — D485 Neoplasm of uncertain behavior of skin: Secondary | ICD-10-CM | POA: Diagnosis not present

## 2014-04-02 DIAGNOSIS — R6884 Jaw pain: Secondary | ICD-10-CM | POA: Diagnosis not present

## 2014-04-02 DIAGNOSIS — L03818 Cellulitis of other sites: Secondary | ICD-10-CM | POA: Diagnosis not present

## 2014-04-10 DIAGNOSIS — C61 Malignant neoplasm of prostate: Secondary | ICD-10-CM | POA: Diagnosis not present

## 2014-05-08 DIAGNOSIS — C61 Malignant neoplasm of prostate: Secondary | ICD-10-CM | POA: Diagnosis not present

## 2014-06-05 DIAGNOSIS — R7301 Impaired fasting glucose: Secondary | ICD-10-CM | POA: Diagnosis not present

## 2014-06-05 DIAGNOSIS — I10 Essential (primary) hypertension: Secondary | ICD-10-CM | POA: Diagnosis not present

## 2014-06-05 DIAGNOSIS — E039 Hypothyroidism, unspecified: Secondary | ICD-10-CM | POA: Diagnosis not present

## 2014-06-05 DIAGNOSIS — E782 Mixed hyperlipidemia: Secondary | ICD-10-CM | POA: Diagnosis not present

## 2014-06-12 DIAGNOSIS — M1711 Unilateral primary osteoarthritis, right knee: Secondary | ICD-10-CM | POA: Diagnosis not present

## 2014-06-12 DIAGNOSIS — K088 Other specified disorders of teeth and supporting structures: Secondary | ICD-10-CM | POA: Diagnosis not present

## 2014-06-12 DIAGNOSIS — C61 Malignant neoplasm of prostate: Secondary | ICD-10-CM | POA: Diagnosis not present

## 2014-07-10 DIAGNOSIS — C61 Malignant neoplasm of prostate: Secondary | ICD-10-CM | POA: Diagnosis not present

## 2014-08-10 DIAGNOSIS — C61 Malignant neoplasm of prostate: Secondary | ICD-10-CM | POA: Diagnosis not present

## 2014-08-11 DIAGNOSIS — C61 Malignant neoplasm of prostate: Secondary | ICD-10-CM | POA: Diagnosis not present

## 2014-09-11 DIAGNOSIS — C61 Malignant neoplasm of prostate: Secondary | ICD-10-CM | POA: Diagnosis not present

## 2014-09-22 DIAGNOSIS — M8580 Other specified disorders of bone density and structure, unspecified site: Secondary | ICD-10-CM | POA: Insufficient documentation

## 2014-09-25 DIAGNOSIS — M8580 Other specified disorders of bone density and structure, unspecified site: Secondary | ICD-10-CM | POA: Diagnosis not present

## 2014-09-25 DIAGNOSIS — Z8546 Personal history of malignant neoplasm of prostate: Secondary | ICD-10-CM | POA: Diagnosis not present

## 2014-09-25 DIAGNOSIS — M1711 Unilateral primary osteoarthritis, right knee: Secondary | ICD-10-CM | POA: Diagnosis not present

## 2014-09-25 DIAGNOSIS — T386X6A Underdosing of antigonadotrophins, antiestrogens, antiandrogens, not elsewhere classified, initial encounter: Secondary | ICD-10-CM | POA: Diagnosis not present

## 2014-09-25 DIAGNOSIS — C7951 Secondary malignant neoplasm of bone: Secondary | ICD-10-CM | POA: Diagnosis not present

## 2014-09-26 DIAGNOSIS — C7951 Secondary malignant neoplasm of bone: Secondary | ICD-10-CM | POA: Diagnosis not present

## 2014-09-26 DIAGNOSIS — C61 Malignant neoplasm of prostate: Secondary | ICD-10-CM | POA: Diagnosis not present

## 2014-09-26 DIAGNOSIS — K088 Other specified disorders of teeth and supporting structures: Secondary | ICD-10-CM | POA: Diagnosis not present

## 2014-09-26 DIAGNOSIS — R937 Abnormal findings on diagnostic imaging of other parts of musculoskeletal system: Secondary | ICD-10-CM | POA: Diagnosis not present

## 2014-09-26 DIAGNOSIS — M8580 Other specified disorders of bone density and structure, unspecified site: Secondary | ICD-10-CM | POA: Diagnosis not present

## 2014-10-09 DIAGNOSIS — C61 Malignant neoplasm of prostate: Secondary | ICD-10-CM | POA: Diagnosis not present

## 2014-10-20 DIAGNOSIS — C61 Malignant neoplasm of prostate: Secondary | ICD-10-CM | POA: Diagnosis not present

## 2014-10-23 DIAGNOSIS — C61 Malignant neoplasm of prostate: Secondary | ICD-10-CM | POA: Diagnosis not present

## 2014-10-23 DIAGNOSIS — K089 Disorder of teeth and supporting structures, unspecified: Secondary | ICD-10-CM | POA: Diagnosis not present

## 2014-11-07 DIAGNOSIS — C61 Malignant neoplasm of prostate: Secondary | ICD-10-CM | POA: Diagnosis not present

## 2014-11-27 DIAGNOSIS — C61 Malignant neoplasm of prostate: Secondary | ICD-10-CM | POA: Diagnosis not present

## 2014-11-27 DIAGNOSIS — I10 Essential (primary) hypertension: Secondary | ICD-10-CM | POA: Diagnosis not present

## 2014-11-27 DIAGNOSIS — M87180 Osteonecrosis due to drugs, jaw: Secondary | ICD-10-CM | POA: Diagnosis not present

## 2014-11-27 DIAGNOSIS — Z79899 Other long term (current) drug therapy: Secondary | ICD-10-CM | POA: Diagnosis not present

## 2014-12-04 DIAGNOSIS — Z79899 Other long term (current) drug therapy: Secondary | ICD-10-CM | POA: Diagnosis not present

## 2014-12-04 DIAGNOSIS — I10 Essential (primary) hypertension: Secondary | ICD-10-CM | POA: Diagnosis not present

## 2014-12-04 DIAGNOSIS — C61 Malignant neoplasm of prostate: Secondary | ICD-10-CM | POA: Diagnosis not present

## 2014-12-04 DIAGNOSIS — M87180 Osteonecrosis due to drugs, jaw: Secondary | ICD-10-CM | POA: Diagnosis not present

## 2014-12-18 DIAGNOSIS — M8580 Other specified disorders of bone density and structure, unspecified site: Secondary | ICD-10-CM | POA: Diagnosis not present

## 2014-12-18 DIAGNOSIS — M87180 Osteonecrosis due to drugs, jaw: Secondary | ICD-10-CM | POA: Insufficient documentation

## 2014-12-18 DIAGNOSIS — C61 Malignant neoplasm of prostate: Secondary | ICD-10-CM | POA: Diagnosis not present

## 2014-12-18 DIAGNOSIS — K0889 Other specified disorders of teeth and supporting structures: Secondary | ICD-10-CM | POA: Diagnosis not present

## 2015-01-01 DIAGNOSIS — C61 Malignant neoplasm of prostate: Secondary | ICD-10-CM | POA: Diagnosis not present

## 2015-01-01 DIAGNOSIS — M8580 Other specified disorders of bone density and structure, unspecified site: Secondary | ICD-10-CM | POA: Diagnosis not present

## 2015-01-30 DIAGNOSIS — C61 Malignant neoplasm of prostate: Secondary | ICD-10-CM | POA: Diagnosis not present

## 2015-01-30 DIAGNOSIS — R948 Abnormal results of function studies of other organs and systems: Secondary | ICD-10-CM | POA: Diagnosis not present

## 2015-02-27 DIAGNOSIS — C61 Malignant neoplasm of prostate: Secondary | ICD-10-CM | POA: Diagnosis not present

## 2015-03-07 DIAGNOSIS — N3941 Urge incontinence: Secondary | ICD-10-CM | POA: Diagnosis not present

## 2015-03-07 DIAGNOSIS — C61 Malignant neoplasm of prostate: Secondary | ICD-10-CM | POA: Diagnosis not present

## 2015-03-07 DIAGNOSIS — M87180 Osteonecrosis due to drugs, jaw: Secondary | ICD-10-CM | POA: Diagnosis not present

## 2015-03-07 DIAGNOSIS — K089 Disorder of teeth and supporting structures, unspecified: Secondary | ICD-10-CM | POA: Diagnosis not present

## 2015-03-16 DIAGNOSIS — E039 Hypothyroidism, unspecified: Secondary | ICD-10-CM | POA: Diagnosis not present

## 2015-03-16 DIAGNOSIS — Z6837 Body mass index (BMI) 37.0-37.9, adult: Secondary | ICD-10-CM | POA: Diagnosis not present

## 2015-03-16 DIAGNOSIS — K060 Gingival recession: Secondary | ICD-10-CM | POA: Diagnosis not present

## 2015-03-26 DIAGNOSIS — C61 Malignant neoplasm of prostate: Secondary | ICD-10-CM | POA: Diagnosis not present

## 2015-03-28 DIAGNOSIS — Z79891 Long term (current) use of opiate analgesic: Secondary | ICD-10-CM | POA: Diagnosis not present

## 2015-03-28 DIAGNOSIS — Z79899 Other long term (current) drug therapy: Secondary | ICD-10-CM | POA: Diagnosis not present

## 2015-03-28 DIAGNOSIS — T50905A Adverse effect of unspecified drugs, medicaments and biological substances, initial encounter: Secondary | ICD-10-CM | POA: Diagnosis not present

## 2015-03-28 DIAGNOSIS — I10 Essential (primary) hypertension: Secondary | ICD-10-CM | POA: Diagnosis not present

## 2015-03-28 DIAGNOSIS — M87180 Osteonecrosis due to drugs, jaw: Secondary | ICD-10-CM | POA: Diagnosis not present

## 2015-03-28 DIAGNOSIS — Z7982 Long term (current) use of aspirin: Secondary | ICD-10-CM | POA: Diagnosis not present

## 2015-04-16 DIAGNOSIS — E119 Type 2 diabetes mellitus without complications: Secondary | ICD-10-CM | POA: Diagnosis not present

## 2015-04-16 DIAGNOSIS — Z6823 Body mass index (BMI) 23.0-23.9, adult: Secondary | ICD-10-CM | POA: Diagnosis not present

## 2015-04-16 DIAGNOSIS — I10 Essential (primary) hypertension: Secondary | ICD-10-CM | POA: Diagnosis not present

## 2015-04-16 DIAGNOSIS — J019 Acute sinusitis, unspecified: Secondary | ICD-10-CM | POA: Diagnosis not present

## 2015-04-16 DIAGNOSIS — E039 Hypothyroidism, unspecified: Secondary | ICD-10-CM | POA: Diagnosis not present

## 2015-04-16 DIAGNOSIS — C61 Malignant neoplasm of prostate: Secondary | ICD-10-CM | POA: Diagnosis not present

## 2015-04-23 DIAGNOSIS — C61 Malignant neoplasm of prostate: Secondary | ICD-10-CM | POA: Diagnosis not present

## 2015-05-07 DIAGNOSIS — M87180 Osteonecrosis due to drugs, jaw: Secondary | ICD-10-CM | POA: Diagnosis not present

## 2015-05-07 DIAGNOSIS — C61 Malignant neoplasm of prostate: Secondary | ICD-10-CM | POA: Diagnosis not present

## 2015-05-07 DIAGNOSIS — M8580 Other specified disorders of bone density and structure, unspecified site: Secondary | ICD-10-CM | POA: Diagnosis not present

## 2015-05-21 DIAGNOSIS — Z5112 Encounter for antineoplastic immunotherapy: Secondary | ICD-10-CM | POA: Diagnosis not present

## 2015-05-21 DIAGNOSIS — C61 Malignant neoplasm of prostate: Secondary | ICD-10-CM | POA: Diagnosis not present

## 2015-05-28 DIAGNOSIS — E119 Type 2 diabetes mellitus without complications: Secondary | ICD-10-CM | POA: Diagnosis not present

## 2015-05-28 DIAGNOSIS — H2513 Age-related nuclear cataract, bilateral: Secondary | ICD-10-CM | POA: Diagnosis not present

## 2015-06-18 DIAGNOSIS — C61 Malignant neoplasm of prostate: Secondary | ICD-10-CM | POA: Diagnosis not present

## 2015-06-28 DIAGNOSIS — M1711 Unilateral primary osteoarthritis, right knee: Secondary | ICD-10-CM | POA: Diagnosis not present

## 2015-06-28 DIAGNOSIS — E119 Type 2 diabetes mellitus without complications: Secondary | ICD-10-CM | POA: Diagnosis not present

## 2015-06-28 DIAGNOSIS — M1712 Unilateral primary osteoarthritis, left knee: Secondary | ICD-10-CM | POA: Diagnosis not present

## 2015-06-28 DIAGNOSIS — Z96652 Presence of left artificial knee joint: Secondary | ICD-10-CM | POA: Diagnosis not present

## 2015-06-28 DIAGNOSIS — C61 Malignant neoplasm of prostate: Secondary | ICD-10-CM | POA: Diagnosis not present

## 2015-06-28 DIAGNOSIS — I10 Essential (primary) hypertension: Secondary | ICD-10-CM | POA: Diagnosis not present

## 2015-07-02 DIAGNOSIS — C61 Malignant neoplasm of prostate: Secondary | ICD-10-CM | POA: Diagnosis not present

## 2015-07-02 DIAGNOSIS — M87180 Osteonecrosis due to drugs, jaw: Secondary | ICD-10-CM | POA: Diagnosis not present

## 2015-07-06 DIAGNOSIS — C799 Secondary malignant neoplasm of unspecified site: Secondary | ICD-10-CM | POA: Diagnosis not present

## 2015-07-06 DIAGNOSIS — C61 Malignant neoplasm of prostate: Secondary | ICD-10-CM | POA: Diagnosis not present

## 2015-07-10 DIAGNOSIS — C61 Malignant neoplasm of prostate: Secondary | ICD-10-CM | POA: Diagnosis not present

## 2015-07-11 DIAGNOSIS — C61 Malignant neoplasm of prostate: Secondary | ICD-10-CM | POA: Diagnosis not present

## 2015-07-11 DIAGNOSIS — Z510 Encounter for antineoplastic radiation therapy: Secondary | ICD-10-CM | POA: Diagnosis not present

## 2015-07-11 DIAGNOSIS — C7951 Secondary malignant neoplasm of bone: Secondary | ICD-10-CM | POA: Diagnosis not present

## 2015-07-16 DIAGNOSIS — Z510 Encounter for antineoplastic radiation therapy: Secondary | ICD-10-CM | POA: Diagnosis not present

## 2015-07-16 DIAGNOSIS — C61 Malignant neoplasm of prostate: Secondary | ICD-10-CM | POA: Diagnosis not present

## 2015-07-16 DIAGNOSIS — C7951 Secondary malignant neoplasm of bone: Secondary | ICD-10-CM | POA: Diagnosis not present

## 2015-07-17 DIAGNOSIS — C7951 Secondary malignant neoplasm of bone: Secondary | ICD-10-CM | POA: Diagnosis not present

## 2015-07-17 DIAGNOSIS — Z510 Encounter for antineoplastic radiation therapy: Secondary | ICD-10-CM | POA: Diagnosis not present

## 2015-07-17 DIAGNOSIS — C61 Malignant neoplasm of prostate: Secondary | ICD-10-CM | POA: Diagnosis not present

## 2015-07-18 DIAGNOSIS — C7951 Secondary malignant neoplasm of bone: Secondary | ICD-10-CM | POA: Diagnosis not present

## 2015-07-18 DIAGNOSIS — Z510 Encounter for antineoplastic radiation therapy: Secondary | ICD-10-CM | POA: Diagnosis not present

## 2015-07-18 DIAGNOSIS — C61 Malignant neoplasm of prostate: Secondary | ICD-10-CM | POA: Diagnosis not present

## 2015-07-19 DIAGNOSIS — Z510 Encounter for antineoplastic radiation therapy: Secondary | ICD-10-CM | POA: Diagnosis not present

## 2015-07-19 DIAGNOSIS — C7951 Secondary malignant neoplasm of bone: Secondary | ICD-10-CM | POA: Diagnosis not present

## 2015-07-19 DIAGNOSIS — H538 Other visual disturbances: Secondary | ICD-10-CM | POA: Diagnosis not present

## 2015-07-19 DIAGNOSIS — H2511 Age-related nuclear cataract, right eye: Secondary | ICD-10-CM | POA: Diagnosis not present

## 2015-07-19 DIAGNOSIS — C61 Malignant neoplasm of prostate: Secondary | ICD-10-CM | POA: Diagnosis not present

## 2015-07-20 DIAGNOSIS — Z510 Encounter for antineoplastic radiation therapy: Secondary | ICD-10-CM | POA: Diagnosis not present

## 2015-07-20 DIAGNOSIS — C61 Malignant neoplasm of prostate: Secondary | ICD-10-CM | POA: Diagnosis not present

## 2015-07-20 DIAGNOSIS — C7951 Secondary malignant neoplasm of bone: Secondary | ICD-10-CM | POA: Diagnosis not present

## 2015-07-23 DIAGNOSIS — Z79899 Other long term (current) drug therapy: Secondary | ICD-10-CM | POA: Diagnosis not present

## 2015-07-23 DIAGNOSIS — M1990 Unspecified osteoarthritis, unspecified site: Secondary | ICD-10-CM | POA: Diagnosis not present

## 2015-07-23 DIAGNOSIS — H269 Unspecified cataract: Secondary | ICD-10-CM | POA: Diagnosis not present

## 2015-07-23 DIAGNOSIS — H52209 Unspecified astigmatism, unspecified eye: Secondary | ICD-10-CM | POA: Diagnosis not present

## 2015-07-23 DIAGNOSIS — H5210 Myopia, unspecified eye: Secondary | ICD-10-CM | POA: Diagnosis not present

## 2015-07-23 DIAGNOSIS — I10 Essential (primary) hypertension: Secondary | ICD-10-CM | POA: Diagnosis not present

## 2015-07-23 DIAGNOSIS — Z7984 Long term (current) use of oral hypoglycemic drugs: Secondary | ICD-10-CM | POA: Diagnosis not present

## 2015-07-23 DIAGNOSIS — H538 Other visual disturbances: Secondary | ICD-10-CM | POA: Diagnosis not present

## 2015-07-23 DIAGNOSIS — Z7982 Long term (current) use of aspirin: Secondary | ICD-10-CM | POA: Diagnosis not present

## 2015-07-23 DIAGNOSIS — H524 Presbyopia: Secondary | ICD-10-CM | POA: Diagnosis not present

## 2015-07-23 DIAGNOSIS — Z8546 Personal history of malignant neoplasm of prostate: Secondary | ICD-10-CM | POA: Diagnosis not present

## 2015-07-23 DIAGNOSIS — H2511 Age-related nuclear cataract, right eye: Secondary | ICD-10-CM | POA: Diagnosis not present

## 2015-07-23 DIAGNOSIS — E039 Hypothyroidism, unspecified: Secondary | ICD-10-CM | POA: Diagnosis not present

## 2015-07-23 DIAGNOSIS — G4730 Sleep apnea, unspecified: Secondary | ICD-10-CM | POA: Diagnosis not present

## 2015-07-23 DIAGNOSIS — E119 Type 2 diabetes mellitus without complications: Secondary | ICD-10-CM | POA: Diagnosis not present

## 2015-07-23 DIAGNOSIS — M069 Rheumatoid arthritis, unspecified: Secondary | ICD-10-CM | POA: Diagnosis not present

## 2015-07-23 DIAGNOSIS — E1136 Type 2 diabetes mellitus with diabetic cataract: Secondary | ICD-10-CM | POA: Diagnosis not present

## 2015-07-23 DIAGNOSIS — H2513 Age-related nuclear cataract, bilateral: Secondary | ICD-10-CM | POA: Diagnosis not present

## 2015-07-23 DIAGNOSIS — E7800 Pure hypercholesterolemia, unspecified: Secondary | ICD-10-CM | POA: Diagnosis not present

## 2015-07-24 DIAGNOSIS — C7951 Secondary malignant neoplasm of bone: Secondary | ICD-10-CM | POA: Diagnosis not present

## 2015-07-24 DIAGNOSIS — Z510 Encounter for antineoplastic radiation therapy: Secondary | ICD-10-CM | POA: Diagnosis not present

## 2015-07-24 DIAGNOSIS — C61 Malignant neoplasm of prostate: Secondary | ICD-10-CM | POA: Diagnosis not present

## 2015-07-25 DIAGNOSIS — Z510 Encounter for antineoplastic radiation therapy: Secondary | ICD-10-CM | POA: Diagnosis not present

## 2015-07-25 DIAGNOSIS — C61 Malignant neoplasm of prostate: Secondary | ICD-10-CM | POA: Diagnosis not present

## 2015-07-25 DIAGNOSIS — C7951 Secondary malignant neoplasm of bone: Secondary | ICD-10-CM | POA: Diagnosis not present

## 2015-07-26 DIAGNOSIS — C61 Malignant neoplasm of prostate: Secondary | ICD-10-CM | POA: Diagnosis not present

## 2015-07-26 DIAGNOSIS — Z510 Encounter for antineoplastic radiation therapy: Secondary | ICD-10-CM | POA: Diagnosis not present

## 2015-07-26 DIAGNOSIS — C7951 Secondary malignant neoplasm of bone: Secondary | ICD-10-CM | POA: Diagnosis not present

## 2015-07-27 DIAGNOSIS — Z510 Encounter for antineoplastic radiation therapy: Secondary | ICD-10-CM | POA: Diagnosis not present

## 2015-07-27 DIAGNOSIS — C7951 Secondary malignant neoplasm of bone: Secondary | ICD-10-CM | POA: Diagnosis not present

## 2015-07-27 DIAGNOSIS — C61 Malignant neoplasm of prostate: Secondary | ICD-10-CM | POA: Diagnosis not present

## 2015-07-31 DIAGNOSIS — Z510 Encounter for antineoplastic radiation therapy: Secondary | ICD-10-CM | POA: Diagnosis not present

## 2015-07-31 DIAGNOSIS — C7951 Secondary malignant neoplasm of bone: Secondary | ICD-10-CM | POA: Diagnosis not present

## 2015-07-31 DIAGNOSIS — C61 Malignant neoplasm of prostate: Secondary | ICD-10-CM | POA: Diagnosis not present

## 2015-08-13 DIAGNOSIS — C61 Malignant neoplasm of prostate: Secondary | ICD-10-CM | POA: Diagnosis not present

## 2015-08-13 DIAGNOSIS — Z5111 Encounter for antineoplastic chemotherapy: Secondary | ICD-10-CM | POA: Diagnosis not present

## 2015-08-21 DIAGNOSIS — R0602 Shortness of breath: Secondary | ICD-10-CM | POA: Diagnosis not present

## 2015-08-21 DIAGNOSIS — R110 Nausea: Secondary | ICD-10-CM | POA: Insufficient documentation

## 2015-08-21 DIAGNOSIS — C61 Malignant neoplasm of prostate: Secondary | ICD-10-CM | POA: Diagnosis not present

## 2015-08-21 DIAGNOSIS — R61 Generalized hyperhidrosis: Secondary | ICD-10-CM | POA: Diagnosis not present

## 2015-08-24 DIAGNOSIS — R110 Nausea: Secondary | ICD-10-CM | POA: Diagnosis not present

## 2015-08-24 DIAGNOSIS — R61 Generalized hyperhidrosis: Secondary | ICD-10-CM | POA: Diagnosis not present

## 2015-08-24 DIAGNOSIS — C61 Malignant neoplasm of prostate: Secondary | ICD-10-CM | POA: Diagnosis not present

## 2015-08-24 DIAGNOSIS — C7951 Secondary malignant neoplasm of bone: Secondary | ICD-10-CM | POA: Diagnosis not present

## 2015-08-24 DIAGNOSIS — R0602 Shortness of breath: Secondary | ICD-10-CM | POA: Diagnosis not present

## 2015-08-27 DIAGNOSIS — M87180 Osteonecrosis due to drugs, jaw: Secondary | ICD-10-CM | POA: Diagnosis not present

## 2015-08-27 DIAGNOSIS — C61 Malignant neoplasm of prostate: Secondary | ICD-10-CM | POA: Diagnosis not present

## 2015-08-29 DIAGNOSIS — C61 Malignant neoplasm of prostate: Secondary | ICD-10-CM | POA: Diagnosis not present

## 2015-08-29 DIAGNOSIS — C7951 Secondary malignant neoplasm of bone: Secondary | ICD-10-CM | POA: Diagnosis not present

## 2015-08-29 DIAGNOSIS — Z923 Personal history of irradiation: Secondary | ICD-10-CM | POA: Diagnosis not present

## 2015-09-05 ENCOUNTER — Other Ambulatory Visit (HOSPITAL_COMMUNITY): Payer: Self-pay | Admitting: Oncology

## 2015-09-05 ENCOUNTER — Encounter (HOSPITAL_COMMUNITY): Payer: Self-pay | Admitting: Oncology

## 2015-09-05 DIAGNOSIS — C7951 Secondary malignant neoplasm of bone: Principal | ICD-10-CM

## 2015-09-05 DIAGNOSIS — C61 Malignant neoplasm of prostate: Secondary | ICD-10-CM

## 2015-09-05 HISTORY — DX: Secondary malignant neoplasm of bone: C79.51

## 2015-09-05 HISTORY — DX: Malignant neoplasm of prostate: C61

## 2015-09-10 ENCOUNTER — Encounter (HOSPITAL_COMMUNITY): Payer: Medicare Other | Attending: Hematology & Oncology

## 2015-09-10 ENCOUNTER — Encounter (HOSPITAL_COMMUNITY): Payer: Medicare Other

## 2015-09-10 VITALS — BP 151/85 | HR 59 | Temp 98.5°F | Resp 18

## 2015-09-10 DIAGNOSIS — C7951 Secondary malignant neoplasm of bone: Secondary | ICD-10-CM | POA: Diagnosis not present

## 2015-09-10 DIAGNOSIS — C61 Malignant neoplasm of prostate: Secondary | ICD-10-CM | POA: Diagnosis not present

## 2015-09-10 DIAGNOSIS — Z5111 Encounter for antineoplastic chemotherapy: Secondary | ICD-10-CM | POA: Diagnosis present

## 2015-09-10 LAB — CBC WITH DIFFERENTIAL/PLATELET
BASOS ABS: 0 10*3/uL (ref 0.0–0.1)
BASOS PCT: 0 %
Eosinophils Absolute: 0.2 10*3/uL (ref 0.0–0.7)
Eosinophils Relative: 3 %
HEMATOCRIT: 34.5 % — AB (ref 39.0–52.0)
HEMOGLOBIN: 12 g/dL — AB (ref 13.0–17.0)
Lymphocytes Relative: 16 %
Lymphs Abs: 1 10*3/uL (ref 0.7–4.0)
MCH: 31 pg (ref 26.0–34.0)
MCHC: 34.8 g/dL (ref 30.0–36.0)
MCV: 89.1 fL (ref 78.0–100.0)
Monocytes Absolute: 0.4 10*3/uL (ref 0.1–1.0)
Monocytes Relative: 7 %
NEUTROS ABS: 4.6 10*3/uL (ref 1.7–7.7)
NEUTROS PCT: 74 %
Platelets: 157 10*3/uL (ref 150–400)
RBC: 3.87 MIL/uL — ABNORMAL LOW (ref 4.22–5.81)
RDW: 13 % (ref 11.5–15.5)
WBC: 6.2 10*3/uL (ref 4.0–10.5)

## 2015-09-10 LAB — COMPREHENSIVE METABOLIC PANEL
ALBUMIN: 4.1 g/dL (ref 3.5–5.0)
ALK PHOS: 146 U/L — AB (ref 38–126)
ALT: 22 U/L (ref 17–63)
AST: 22 U/L (ref 15–41)
Anion gap: 8 (ref 5–15)
BILIRUBIN TOTAL: 0.7 mg/dL (ref 0.3–1.2)
BUN: 26 mg/dL — AB (ref 6–20)
CHLORIDE: 103 mmol/L (ref 101–111)
CO2: 26 mmol/L (ref 22–32)
Calcium: 9.4 mg/dL (ref 8.9–10.3)
Creatinine, Ser: 1.07 mg/dL (ref 0.61–1.24)
GFR calc Af Amer: >60 mL/min (ref >60)
GFR calc non Af Amer: >60 mL/min (ref >60)
Glucose, Bld: 174 mg/dL — ABNORMAL HIGH (ref 65–99)
Potassium: 3.8 mmol/L (ref 3.5–5.1)
Sodium: 137 mmol/L (ref 135–145)
TOTAL PROTEIN: 6.5 g/dL (ref 6.5–8.1)

## 2015-09-10 LAB — PSA: PSA: 22.91 ng/mL — AB (ref 0.00–4.00)

## 2015-09-10 MED ORDER — LEUPROLIDE ACETATE 7.5 MG IM KIT
7.5000 mg | PACK | INTRAMUSCULAR | Status: DC
  Administered 2015-09-10: 7.5 mg via INTRAMUSCULAR
  Filled 2015-09-10: qty 7.5

## 2015-09-10 NOTE — Progress Notes (Signed)
Micheal Davis presents today for injection per MD orders. LupronDepot 7.5 mg administered IM in left upper outer Gluteal. Administration without incident. Patient tolerated well.

## 2015-09-10 NOTE — Patient Instructions (Signed)
[place] at [facility] Discharge Instructions  RECOMMENDATIONS MADE BY THE CONSULTANT AND ANY TEST RESULTS WILL BE SENT TO YOUR REFERRING PHYSICIAN.  LupronDepot 7.5 mg injection given today as ordered. Return as scheduled.  Thank you for choosing [place] at [facility] to provide your oncology and hematology care.  To afford each patient quality time with our provider, please arrive at least 15 minutes before your scheduled appointment time.   Beginning January 23rd 2017 lab work for the [facility] will be done in the  Main lab at [facility] on 1st floor. If you have a lab appointment with the [facility] please come in thru the  Main Entrance and check in at the main information desk  You need to re-schedule your appointment should you arrive 10 or more minutes late.  We strive to give you quality time with our providers, and arriving late affects you and other patients whose appointments are after yours.  Also, if you no show three or more times for appointments you may be dismissed from the clinic at the providers discretion.     Again, thank you for choosing [facility].  Our hope is that these requests will decrease the amount of time that you wait before being seen by our physicians.       _____________________________________________________________  Should you have questions after your visit to [facility], please contact our office at [phone] between the hours of 8:30 a.m. and 4:30 p.m.  Voicemails left after 4:30 p.m. will not be returned until the following business day.  For prescription refill requests, have your pharmacy contact our office.         Resources For Cancer Patients and their Caregivers ? American Cancer Society: Can assist with transportation, wigs, general needs, runs Look Good Feel Better.        249-888-4774 ? Cancer Care: Provides financial assistance, online  support groups, medication/co-pay assistance.  1-800-813-HOPE 610-080-5107) ? [facility] Assists [place] cancer patients and their families through emotional , educational and financial support.  310-275-1953 ? [place] DSS Where to apply for food stamps, Medicaid and utility assistance. 815 125 8592 ? RCATS: Transportation to medical appointments. 201-481-9113 ? Social Security Administration: May apply for disability if have a Stage IV cancer. 506-765-6435 731-595-8354 ? [employer], Disability and Transit Services: Assists with nutrition, care and transit needs. [place] Support Programs: @10RELATIVEDAYS @ > Cancer Support Group  2nd Tuesday of the month 1pm-2pm, Journey Room  > Creative Journey  3rd Tuesday of the month 1130am-1pm, Journey Room  > Look Good Feel Better  1st Wednesday of the month 10am-12 noon, Journey Room (Call [place] to register [phone])

## 2015-09-17 ENCOUNTER — Encounter (HOSPITAL_BASED_OUTPATIENT_CLINIC_OR_DEPARTMENT_OTHER): Payer: Medicare Other | Admitting: Oncology

## 2015-09-17 ENCOUNTER — Encounter (HOSPITAL_COMMUNITY): Payer: Self-pay | Admitting: Oncology

## 2015-09-17 VITALS — BP 152/85 | HR 61 | Temp 97.9°F | Resp 16 | Ht 69.0 in | Wt 260.7 lb

## 2015-09-17 DIAGNOSIS — C61 Malignant neoplasm of prostate: Secondary | ICD-10-CM

## 2015-09-17 DIAGNOSIS — C7951 Secondary malignant neoplasm of bone: Secondary | ICD-10-CM

## 2015-09-17 MED ORDER — OXYCODONE-ACETAMINOPHEN 10-325 MG PO TABS: 1.0000 | ORAL_TABLET | Freq: Four times a day (QID) | ORAL | Status: DC | PRN

## 2015-09-17 NOTE — Patient Instructions (Signed)
[place] at [facility] Discharge Instructions  RECOMMENDATIONS MADE BY THE CONSULTANT AND ANY TEST RESULTS WILL BE SENT TO YOUR REFERRING PHYSICIAN.  You received Depo Lupron injection today Depo Lupron injection monthly Return in 2 months for follow up with Dr. Whitney Muse  Please sign a release form so we can view x-rays and CT scans from [place] for Oxycodone written Please contact Center with related concerns.   Thank you for choosing [place] at [facility] to provide your oncology and hematology care.  To afford each patient quality time with our provider, please arrive at least 15 minutes before your scheduled appointment time.   Beginning January 23rd 2017 lab work for the [facility] will be done in the  Main lab at [facility] on 1st floor. If you have a lab appointment with the [facility] please come in thru the  Main Entrance and check in at the main information desk  You need to re-schedule your appointment should you arrive 10 or more minutes late.  We strive to give you quality time with our providers, and arriving late affects you and other patients whose appointments are after yours.  Also, if you no show three or more times for appointments you may be dismissed from the clinic at the providers discretion.     Again, thank you for choosing [facility].  Our hope is that these requests will decrease the amount of time that you wait before being seen by our physicians.       _____________________________________________________________  Should you have questions after your visit to [facility], please contact our office at [phone] between the hours of 8:30 a.m. and 4:30 p.m.  Voicemails left after 4:30 p.m. will not be returned until the following business day.  For prescription refill requests, have your pharmacy contact our office.         Resources For Cancer Patients and  their Caregivers ? American Cancer Society: Can assist with transportation, wigs, general needs, runs Look Good Feel Better.        781-755-6600 ? Cancer Care: Provides financial assistance, online support groups, medication/co-pay assistance.  1-800-813-HOPE 617-206-2353) ? [facility] Assists [place] cancer patients and their families through emotional , educational and financial support.  763-418-8818 ? [place] DSS Where to apply for food stamps, Medicaid and utility assistance. (782)438-7095 ? RCATS: Transportation to medical appointments. 240-110-2105 ? Social Security Administration: May apply for disability if have a Stage IV cancer. 941-786-0179 724 708 7348 ? [employer], Disability and Transit Services: Assists with nutrition, care and transit needs. [place] Support Programs: @10RELATIVEDAYS @ > Cancer Support Group  2nd Tuesday of the month 1pm-2pm, Journey Room  > Creative Journey  3rd Tuesday of the month 1130am-1pm, Journey Room  > Look Good Feel Better  1st Wednesday of the month 10am-12 noon, Journey Room (Call [place] to register [phone])
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
## 2015-09-17 NOTE — Assessment & Plan Note (Signed)
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  S/P the following treatments with continued Depo-Lupron monthly throughout: Casodex, Zytiga + Prednisone, and Xtandi.  Additionally, he recently complete palliative XRT to left femur.  Oncology history is developed.  Chart is reviewed in detail.  Labs on 7/10: CBC diff, CMET, PSA.  I personally reviewed and went over laboratory results with the patient.  The results are noted within this dictation.  I personally reviewed and went over radiographic studies with the patient.  The results are noted within this dictation.  Most recent bone scans are noted.  Patient reports more recent CT imaging.  I do not have access to these records.  Depo-Lupron supportive therapy plan is built.  Depo-Lupron today per treatment schedule.  Past upon imaging studies, the patient is educated on a number of treatment options available to him.  Based upon past imaging and patient relaying information from msot recent CT images that we will ascertain records for, he may be an excellent candidate for Xofigo for bone pain control and in addition to improvement in overall survival.  He is also educate that Taxotere and Carbaxetaxel are additional treatment options.  He is open to all options.  We will ask him to sign a release of information to ascertain imaging records and XRT records.  I will discuss Xofigo treatment option with Dr. Tammi Klippel, radiation oncology.  He will return in 4 weeks for Depo-Lupron.  He will return in 2 months for follow-up

## 2015-10-08 ENCOUNTER — Encounter (HOSPITAL_COMMUNITY): Payer: Medicare Other | Attending: Hematology & Oncology

## 2015-10-08 ENCOUNTER — Encounter (HOSPITAL_COMMUNITY): Payer: Medicare Other

## 2015-10-08 VITALS — BP 153/81 | HR 61 | Temp 97.8°F | Resp 18 | Wt 264.8 lb

## 2015-10-08 DIAGNOSIS — C7951 Secondary malignant neoplasm of bone: Secondary | ICD-10-CM | POA: Insufficient documentation

## 2015-10-08 DIAGNOSIS — C61 Malignant neoplasm of prostate: Secondary | ICD-10-CM

## 2015-10-08 DIAGNOSIS — Z5111 Encounter for antineoplastic chemotherapy: Secondary | ICD-10-CM

## 2015-10-08 LAB — CBC WITH DIFFERENTIAL/PLATELET
BASOS PCT: 0 %
Basophils Absolute: 0 10*3/uL (ref 0.0–0.1)
EOS ABS: 0.1 10*3/uL (ref 0.0–0.7)
Eosinophils Relative: 2 %
HEMATOCRIT: 33.3 % — AB (ref 39.0–52.0)
HEMOGLOBIN: 11.8 g/dL — AB (ref 13.0–17.0)
LYMPHS PCT: 17 %
Lymphs Abs: 1 10*3/uL (ref 0.7–4.0)
MCH: 32 pg (ref 26.0–34.0)
MCHC: 35.4 g/dL (ref 30.0–36.0)
MCV: 90.2 fL (ref 78.0–100.0)
Monocytes Absolute: 0.4 10*3/uL (ref 0.1–1.0)
Monocytes Relative: 8 %
NEUTROS PCT: 73 %
Neutro Abs: 4.2 10*3/uL (ref 1.7–7.7)
Platelets: 182 10*3/uL (ref 150–400)
RBC: 3.69 MIL/uL — AB (ref 4.22–5.81)
RDW: 13.7 % (ref 11.5–15.5)
WBC: 5.8 10*3/uL (ref 4.0–10.5)

## 2015-10-08 LAB — COMPREHENSIVE METABOLIC PANEL
ALBUMIN: 4 g/dL (ref 3.5–5.0)
ALK PHOS: 138 U/L — AB (ref 38–126)
ALT: 27 U/L (ref 17–63)
ANION GAP: 7 (ref 5–15)
AST: 25 U/L (ref 15–41)
BUN: 23 mg/dL — AB (ref 6–20)
CALCIUM: 9.2 mg/dL (ref 8.9–10.3)
CO2: 27 mmol/L (ref 22–32)
CREATININE: 1.07 mg/dL (ref 0.61–1.24)
Chloride: 103 mmol/L (ref 101–111)
GFR calc Af Amer: >60 mL/min (ref >60)
GFR calc non Af Amer: >60 mL/min (ref >60)
GLUCOSE: 119 mg/dL — AB (ref 65–99)
Potassium: 4 mmol/L (ref 3.5–5.1)
SODIUM: 137 mmol/L (ref 135–145)
Total Bilirubin: 0.6 mg/dL (ref 0.3–1.2)
Total Protein: 6.5 g/dL (ref 6.5–8.1)

## 2015-10-08 LAB — PSA: PSA: 20.48 ng/mL — ABNORMAL HIGH (ref 0.00–4.00)

## 2015-10-08 MED ORDER — LEUPROLIDE ACETATE 7.5 MG IM KIT
7.5000 mg | PACK | INTRAMUSCULAR | Status: DC
  Administered 2015-10-08: 7.5 mg via INTRAMUSCULAR
  Filled 2015-10-08: qty 7.5

## 2015-10-08 NOTE — Progress Notes (Signed)
Micheal Davis presents today for injection per MD orders. lupron 7.5mg   administered IM in right buttocks Administration without incident. Patient tolerated well.

## 2015-10-08 NOTE — Patient Instructions (Signed)
[place] at [facility] Discharge Instructions  RECOMMENDATIONS MADE BY THE CONSULTANT AND ANY TEST RESULTS WILL BE SENT TO YOUR REFERRING PHYSICIAN.  Lupron given today per orders Follow up as ordered   Thank you for choosing [place] at [facility] to provide your oncology and hematology care.  To afford each patient quality time with our provider, please arrive at least 15 minutes before your scheduled appointment time.   Beginning January 23rd 2017 lab work for the [facility] will be done in the  Main lab at [facility] on 1st floor. If you have a lab appointment with the [facility] please come in thru the  Main Entrance and check in at the main information desk  You need to re-schedule your appointment should you arrive 10 or more minutes late.  We strive to give you quality time with our providers, and arriving late affects you and other patients whose appointments are after yours.  Also, if you no show three or more times for appointments you may be dismissed from the clinic at the providers discretion.     Again, thank you for choosing [facility].  Our hope is that these requests will decrease the amount of time that you wait before being seen by our physicians.       _____________________________________________________________  Should you have questions after your visit to [facility], please contact our office at [phone] between the hours of 8:30 a.m. and 4:30 p.m.  Voicemails left after 4:30 p.m. will not be returned until the following business day.  For prescription refill requests, have your pharmacy contact our office.         Resources For Cancer Patients and their Caregivers ? American Cancer Society: Can assist with transportation, wigs, general needs, runs Look Good Feel Better.        (431) 450-6954 ? Cancer Care: Provides financial assistance, online support groups,  medication/co-pay assistance.  1-800-813-HOPE 5012145493) ? [place] Assists [place] cancer patients and their families through emotional , educational and financial support.  867-377-5897 ? [place] DSS Where to apply for food stamps, Medicaid and utility assistance. 385-570-8881 ? RCATS: Transportation to medical appointments. 562-704-2615 ? Social Security Administration: May apply for disability if have a Stage IV cancer. 323-818-2909 7064182172 ? [employer], Disability and Transit Services: Assists with nutrition, care and transit needs. [place] Support Programs: @10RELATIVEDAYS @ > Cancer Support Group  2nd Tuesday of the month 1pm-2pm, Journey Room  > Creative Journey  3rd Tuesday of the month 1130am-1pm, Journey Room  > Look Good Feel Better  1st Wednesday of the month 10am-12 noon, Journey Room (Call [place] to register [phone])

## 2015-10-14 ENCOUNTER — Encounter (HOSPITAL_COMMUNITY): Payer: Self-pay | Admitting: Oncology

## 2015-10-22 DIAGNOSIS — M87180 Osteonecrosis due to drugs, jaw: Secondary | ICD-10-CM | POA: Diagnosis not present

## 2015-10-22 DIAGNOSIS — C61 Malignant neoplasm of prostate: Secondary | ICD-10-CM | POA: Diagnosis not present

## 2015-10-26 ENCOUNTER — Other Ambulatory Visit: Payer: Self-pay

## 2015-11-06 ENCOUNTER — Encounter (HOSPITAL_COMMUNITY): Payer: Medicare Other | Attending: Hematology & Oncology

## 2015-11-06 ENCOUNTER — Encounter (HOSPITAL_COMMUNITY): Payer: Self-pay

## 2015-11-06 VITALS — BP 152/68 | HR 63 | Temp 98.3°F | Resp 20

## 2015-11-06 DIAGNOSIS — C61 Malignant neoplasm of prostate: Secondary | ICD-10-CM | POA: Insufficient documentation

## 2015-11-06 DIAGNOSIS — C7951 Secondary malignant neoplasm of bone: Secondary | ICD-10-CM | POA: Insufficient documentation

## 2015-11-06 DIAGNOSIS — Z5111 Encounter for antineoplastic chemotherapy: Secondary | ICD-10-CM | POA: Diagnosis present

## 2015-11-06 LAB — COMPREHENSIVE METABOLIC PANEL
ALBUMIN: 4.2 g/dL (ref 3.5–5.0)
ALK PHOS: 172 U/L — AB (ref 38–126)
ALT: 23 U/L (ref 17–63)
ANION GAP: 10 (ref 5–15)
AST: 23 U/L (ref 15–41)
BILIRUBIN TOTAL: 0.6 mg/dL (ref 0.3–1.2)
BUN: 22 mg/dL — AB (ref 6–20)
CALCIUM: 9.8 mg/dL (ref 8.9–10.3)
CO2: 27 mmol/L (ref 22–32)
Chloride: 101 mmol/L (ref 101–111)
Creatinine, Ser: 1 mg/dL (ref 0.61–1.24)
GFR calc Af Amer: >60 mL/min (ref >60)
GFR calc non Af Amer: >60 mL/min (ref >60)
GLUCOSE: 167 mg/dL — AB (ref 65–99)
POTASSIUM: 3.8 mmol/L (ref 3.5–5.1)
SODIUM: 138 mmol/L (ref 135–145)
Total Protein: 6.5 g/dL (ref 6.5–8.1)

## 2015-11-06 LAB — CBC WITH DIFFERENTIAL/PLATELET
BASOS ABS: 0 10*3/uL (ref 0.0–0.1)
BASOS PCT: 1 %
EOS ABS: 0.1 10*3/uL (ref 0.0–0.7)
Eosinophils Relative: 2 %
HEMATOCRIT: 34.3 % — AB (ref 39.0–52.0)
HEMOGLOBIN: 11.8 g/dL — AB (ref 13.0–17.0)
Lymphocytes Relative: 17 %
Lymphs Abs: 1.1 10*3/uL (ref 0.7–4.0)
MCH: 31.6 pg (ref 26.0–34.0)
MCHC: 34.4 g/dL (ref 30.0–36.0)
MCV: 91.7 fL (ref 78.0–100.0)
MONOS PCT: 4 %
Monocytes Absolute: 0.3 10*3/uL (ref 0.1–1.0)
NEUTROS ABS: 4.8 10*3/uL (ref 1.7–7.7)
NEUTROS PCT: 76 %
Platelets: 190 10*3/uL (ref 150–400)
RBC: 3.74 MIL/uL — ABNORMAL LOW (ref 4.22–5.81)
RDW: 13.4 % (ref 11.5–15.5)
WBC: 6.3 10*3/uL (ref 4.0–10.5)

## 2015-11-06 LAB — PSA: PSA: 30.5 ng/mL — ABNORMAL HIGH (ref 0.00–4.00)

## 2015-11-06 MED ORDER — LEUPROLIDE ACETATE 7.5 MG IM KIT
7.5000 mg | PACK | INTRAMUSCULAR | Status: DC
  Administered 2015-11-06: 7.5 mg via INTRAMUSCULAR
  Filled 2015-11-06: qty 7.5

## 2015-11-06 MED ORDER — OXYCODONE-ACETAMINOPHEN 10-325 MG PO TABS: 1.0000 | ORAL_TABLET | Freq: Four times a day (QID) | ORAL | 0 refills | Status: DC | PRN

## 2015-11-06 MED ORDER — POTASSIUM CHLORIDE CRYS ER 20 MEQ PO TBCR: 20.0000 meq | EXTENDED_RELEASE_TABLET | Freq: Every day | ORAL | 1 refills | Status: DC

## 2015-11-06 NOTE — Progress Notes (Signed)
Pt given Lupron injection in left upper outer buttock. Pt tolerated well. Pt stable and discharged home ambulatory.

## 2015-11-06 NOTE — Patient Instructions (Signed)
[place] at [facility] Discharge Instructions  RECOMMENDATIONS MADE BY THE CONSULTANT AND ANY TEST RESULTS WILL BE SENT TO YOUR REFERRING PHYSICIAN.  You were given a lupron injection todady. Return as scheduled.   Thank you for choosing [place] at [facility] to provide your oncology and hematology care.  To afford each patient quality time with our provider, please arrive at least 15 minutes before your scheduled appointment time.   Beginning January 23rd 2017 lab work for the [facility] will be done in the  Main lab at [facility] on 1st floor. If you have a lab appointment with the [facility] please come in thru the  Main Entrance and check in at the main information desk  You need to re-schedule your appointment should you arrive 10 or more minutes late.  We strive to give you quality time with our providers, and arriving late affects you and other patients whose appointments are after yours.  Also, if you no show three or more times for appointments you may be dismissed from the clinic at the providers discretion.     Again, thank you for choosing [facility].  Our hope is that these requests will decrease the amount of time that you wait before being seen by our physicians.       _____________________________________________________________  Should you have questions after your visit to [facility], please contact our office at [phone] between the hours of 8:30 a.m. and 4:30 p.m.  Voicemails left after 4:30 p.m. will not be returned until the following business day.  For prescription refill requests, have your pharmacy contact our office.         Resources For Cancer Patients and their Caregivers ? American Cancer Society: Can assist with transportation, wigs, general needs, runs Look Good Feel Better.        815 775 0459 ? Cancer Care: Provides financial assistance, online support  groups, medication/co-pay assistance.  1-800-813-HOPE (770)090-9593) ? [place] Assists [place] cancer patients and their families through emotional , educational and financial support.  (610)404-2205 ? [place] DSS Where to apply for food stamps, Medicaid and utility assistance. (787) 364-1318 ? RCATS: Transportation to medical appointments. (272)360-0411 ? Social Security Administration: May apply for disability if have a Stage IV cancer. 725-372-2896 (709) 187-5871 ? [employer], Disability and Transit Services: Assists with nutrition, care and transit needs. [place] Support Programs: @10RELATIVEDAYS @ > Cancer Support Group  2nd Tuesday of the month 1pm-2pm, Journey Room  > Creative Journey  3rd Tuesday of the month 1130am-1pm, Journey Room  > Look Good Feel Better  1st Wednesday of the month 10am-12 noon, Journey Room (Call [place] to register [phone])
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
## 2015-11-19 ENCOUNTER — Encounter (HOSPITAL_BASED_OUTPATIENT_CLINIC_OR_DEPARTMENT_OTHER): Payer: Medicare Other | Admitting: Hematology & Oncology

## 2015-11-19 ENCOUNTER — Encounter (HOSPITAL_COMMUNITY): Payer: Self-pay | Admitting: Hematology & Oncology

## 2015-11-19 ENCOUNTER — Encounter (HOSPITAL_COMMUNITY): Payer: Self-pay | Admitting: Emergency Medicine

## 2015-11-19 VITALS — BP 165/69 | HR 55 | Temp 98.9°F | Wt 265.9 lb

## 2015-11-19 DIAGNOSIS — Z23 Encounter for immunization: Secondary | ICD-10-CM | POA: Diagnosis present

## 2015-11-19 DIAGNOSIS — R9721 Rising PSA following treatment for malignant neoplasm of prostate: Secondary | ICD-10-CM

## 2015-11-19 DIAGNOSIS — C61 Malignant neoplasm of prostate: Secondary | ICD-10-CM

## 2015-11-19 DIAGNOSIS — C7951 Secondary malignant neoplasm of bone: Secondary | ICD-10-CM

## 2015-11-19 DIAGNOSIS — G893 Neoplasm related pain (acute) (chronic): Secondary | ICD-10-CM | POA: Diagnosis not present

## 2015-11-19 DIAGNOSIS — Z0000 Encounter for general adult medical examination without abnormal findings: Secondary | ICD-10-CM

## 2015-11-19 MED ORDER — ONDANSETRON HCL 8 MG PO TABS: 8.0000 mg | ORAL_TABLET | Freq: Two times a day (BID) | ORAL | 1 refills | Status: DC | PRN

## 2015-11-19 MED ORDER — INFLUENZA VAC SPLIT QUAD 0.5 ML IM SUSY
0.5000 mL | PREFILLED_SYRINGE | Freq: Once | INTRAMUSCULAR | Status: AC
  Administered 2015-11-19: 0.5 mL via INTRAMUSCULAR
  Filled 2015-11-19: qty 0.5

## 2015-11-19 MED ORDER — DEXAMETHASONE 4 MG PO TABS: 8.0000 mg | ORAL_TABLET | Freq: Two times a day (BID) | ORAL | 1 refills | Status: DC

## 2015-11-19 MED ORDER — PROCHLORPERAZINE MALEATE 10 MG PO TABS: 10.0000 mg | ORAL_TABLET | Freq: Four times a day (QID) | ORAL | 1 refills | Status: DC | PRN

## 2015-11-19 NOTE — Progress Notes (Unsigned)
Pulled together teaching, pre-meds sent over, labs entered, and chemo/doctor/PICC line appt made.

## 2015-11-19 NOTE — Progress Notes (Signed)
Pt given flu vaccine in right deltoid without any complications. Pt verbalized understanding of vaccination handout.

## 2015-11-19 NOTE — Patient Instructions (Addendum)
[facility]   CHEMOTHERAPY INSTRUCTIONS  Premeds: Dexamethasone - steroid - given to reduce the risk of you having an allergic type reaction to the chemotherapy. Dex can cause you to feel energized, nervous/anxious/jittery, make you have trouble sleeping, and/or make you feel hot/flushed in the face/neck and/or look pink/red in the face/neck. These side effects will pass as the Dex wears off. (takes 20 minutes to infuse)  Taxotere - bone marrow suppression (lowers white blood cells (fight infection), lowers red blood cells (make up your blood), lowers platelets (help blood to clot). This chemo can cause fluid retention. You will be responsible for taking a steroid called Dexamethasone at home the day before, day of and day after Taxotere. This steroid will keep you from having fluid retention. Take it whether you think you need it or not. Can cause hair loss, skin/nail changes (darkening of the nail beds, pain where the nail bed meets the skin, loosening of the nail beds, dry skin, palms of hands and soles of feet may darken or get sensitive, nausea/vomiting, paresthesia (numbness or tingling) in extremities - we need to know if this develops, mucositis (inflammation of any mucosal membrane - the mouth, throat), mouth sores, neurotoxicity (loss of memory, headaches, trouble sleeping, etc.), can also cause excessive tear production. Please let us know if any side effect develops. (takes 1 hour to infuse)  Neulasta - this medication is not chemo but being given because you have had chemo. It is usually given 24-27 hours after the completion of chemotherapy. This medication works by boosting your bone marrow's supply of white blood cells. White blood cells are what protect our bodies against infection. The medication is given in the form of a subcutaneous injection. It is given in the fatty tissue of your abdomen. It is a short needle. The major side effect of this medication is bone or muscle  pain. The drug of choice to relieve or lessen the pain is Aleve or Ibuprofen. If a physician has ever told you not to take Aleve or Ibuprofen - then don't take it. You should then take Tylenol/acetaminophen. Take either medication as the bottle directs you to.  The level of pain you experience as a result of this injection can range from none, to mild or moderate, or severe. Please let us know if you develop moderate or severe bone pain.      EDUCATIONAL MATERIALS GIVEN AND REVIEWED:  Information on taxotere and neulasta given, nutrition book, and chemotherapy and you book given   SELF CARE ACTIVITIES WHILE ON CHEMOTHERAPY:  Increase your fluid intake 48 hours prior to treatment and drink at least 2 quarts (64 oz of water/decaff beverages) per day after treatment. No alcohol intake. No aspirin or other medications unless approved by your oncologist. Eat foods that are light and easy to digest. Eat foods at cold or room temperature (as long as you aren't on the drug Oxaliplatin). No fried, fatty, or spicy foods immediately before or after treatment. Have teeth cleaned professionally before starting treatment. Keep dentures and partial plates clean. Use soft toothbrush and do not use mouthwashes that contain alcohol. Biotene is a good mouthwash that is available at most pharmacies or may be ordered by calling [phone]. Use warm salt water gargles (1 teaspoon salt per 1 quart warm water) before and after meals and at bedtime. Or you may rinse with 2 tablespoons of three-percent hydrogen peroxide mixed in eight ounces of water. Always use sunscreen that has not expired  and with SPF (Sun Protection Factor) of 50 or higher. Wear hats to protect your head from the sun. Remember to use sunscreen on your hands, ears, face, & feet. Use your nausea medication as directed to prevent nausea. Use your stool softener or laxative as directed to prevent constipation. Use your anti-diarrheal medication as directed to  stop diarrhea.   Please wash your hands for at least 30 seconds using warm soapy water. Handwashing is the #1 way to prevent the spread of germs. Stay away from sick people or people who are getting over a cold. If you develop respiratory systems such as green/yellow mucus production or productive cough or persistent cough let us know and we will see if you need an antibiotic. It is a good idea to keep a pair of gloves on when going into grocery stores/[employer] to decrease your risk of coming into contact with germs on the carts, etc. Carry alcohol hand gel with you at all times and use it frequently if out in public. All foods need to be cooked thoroughly. No raw foods. No medium or undercooked meats, eggs. If your food is cooked medium well, it does not need to be hot pink or saturated with bloody liquid at all. Vegetables and fruits need to be washed/rinsed under the faucet with a dish detergent before being consumed. You can eat raw fruits and vegetables unless we tell you otherwise but it would be best if you cooked them or bought frozen. Do not eat off of salad bars or hot bars unless you really trust the cleanliness of the restaurant. If you need dental work, please let Dr. Whitney Muse know before you go for your appointment so that we can coordinate the best possible time for you in regards to your chemo regimen. You need to also let your dentist know that you are actively taking chemo. We may need to do labs prior to your dental appointment. We also want your bowels moving at least every other day. If this is not happening, we need to know so that we can get you on a bowel regimen to help you go. If you are going to have sex, a condom must be used to protect the person that isn't taking chemotherapy. Chemo can decrease your libido (sex drive).    MEDICATIONS:  Dexamethasone 4mg  tablet.  Take 2 tablets (8 mg total) by mouth 2 (two) times daily. Start the day before Taxotere. Then daily after chemo for 2  days. - Oral                                                                                                                                                              Zofran/Ondansetron 8mg  tablet. Take 1 tablet every 8 hours as needed for nausea/vomiting. (#1 nausea  med to take, this can constipate)  Compazine/Prochlorperazine 10mg  tablet. Take 1 tablet every 6 hours as needed for nausea/vomiting. (#2 nausea med to take, this can make you sleepy)   Over-the-Counter Meds:  Miralax 1 capful in 8 oz of fluid daily. May increase to two times a day if needed. This is a stool softener. If this doesn't work proceed you can add:  Senokot S  - start with 1 tablet two times a day and increase to 4 tablets two times a day if needed. (total of 8 tablets in a 24 hour period). This is a stimulant laxative.   Call us if this does not help your bowels move.   Imodium 2mg  capsule. Take 2 capsules after the 1st loose stool and then 1 capsule every 2 hours until you go a total of 12 hours without having a loose stool. Call the [facility] if loose stools continue. If diarrhea occurs @ bedtime, take 2 capsules @ bedtime. Then take 2 capsules every 4 hours until morning. Call [facility].      SYMPTOMS TO REPORT AS SOON AS POSSIBLE AFTER TREATMENT:  FEVER GREATER THAN 100.5 F  CHILLS WITH OR WITHOUT FEVER  NAUSEA AND VOMITING THAT IS NOT CONTROLLED WITH YOUR NAUSEA MEDICATION  UNUSUAL SHORTNESS OF BREATH  UNUSUAL BRUISING OR BLEEDING  TENDERNESS IN MOUTH AND THROAT WITH OR WITHOUT PRESENCE OF ULCERS  URINARY PROBLEMS  BOWEL PROBLEMS  UNUSUAL RASH    Wear comfortable clothing and clothing appropriate for easy access to any Portacath or PICC line. Let us know if there is anything that we can do to make your therapy better!      I have been informed and understand all of the instructions given to me and have received a copy. I have been instructed to call the clinic ([phone])  or my family  physician as soon as possible for continued medical care, if indicated. I do not have any more questions at this time but understand that I may call the [facility] at ([phone]) during office hours should I have questions or need assistance in obtaining follow-up care.       Docetaxel injection What is this medicine? DOCETAXEL (doe se TAX el) is a chemotherapy drug. It targets fast dividing cells, like cancer cells, and causes these cells to die. This medicine is used to treat many types of cancers like breast cancer, certain stomach cancers, head and neck cancer, lung cancer, and prostate cancer. This medicine may be used for other purposes; ask your health care provider or pharmacist if you have questions. What should I tell my health care provider before I take this medicine? They need to know if you have any of these conditions: -infection (especially a virus infection such as chickenpox, cold sores, or herpes) -liver disease -low blood counts, like low white cell, platelet, or red cell counts -an unusual or allergic reaction to docetaxel, polysorbate 80, other chemotherapy agents, other medicines, foods, dyes, or preservatives -pregnant or trying to get pregnant -breast-feeding How should I use this medicine? This drug is given as an infusion into a vein. It is administered in a hospital or clinic by a specially trained health care professional. Talk to your pediatrician regarding the use of this medicine in children. Special care may be needed. Overdosage: If you think you have taken too much of this medicine contact a poison control center or emergency room at once. NOTE: This medicine is only for you. Do not share this  medicine with others. What if I miss a dose? It is important not to miss your dose. Call your doctor or health care professional if you are unable to keep an appointment. What may interact with this medicine? -cyclosporine -erythromycin -ketoconazole -medicines to  increase blood counts like filgrastim, pegfilgrastim, sargramostim -vaccines Talk to your doctor or health care professional before taking any of these medicines: -acetaminophen -aspirin -ibuprofen -ketoprofen -naproxen This list may not describe all possible interactions. Give your health care provider a list of all the medicines, herbs, non-prescription drugs, or dietary supplements you use. Also tell them if you smoke, drink alcohol, or use illegal drugs. Some items may interact with your medicine. What should I watch for while using this medicine? Your condition will be monitored carefully while you are receiving this medicine. You will need important blood work done while you are taking this medicine. This drug may make you feel generally unwell. This is not uncommon, as chemotherapy can affect healthy cells as well as cancer cells. Report any side effects. Continue your course of treatment even though you feel ill unless your doctor tells you to stop. In some cases, you may be given additional medicines to help with side effects. Follow all directions for their use. Call your doctor or health care professional for advice if you get a fever, chills or sore throat, or other symptoms of a cold or flu. Do not treat yourself. This drug decreases your body's ability to fight infections. Try to avoid being around people who are sick. This medicine may increase your risk to bruise or bleed. Call your doctor or health care professional if you notice any unusual bleeding. This medicine may contain alcohol in the product. You may get drowsy or dizzy. Do not drive, use machinery, or do anything that needs mental alertness until you know how this medicine affects you. Do not stand or sit up quickly, especially if you are an older patient. This reduces the risk of dizzy or fainting spells. Avoid alcoholic drinks. Do not become pregnant while taking this medicine. Women should inform their doctor if they wish  to become pregnant or think they might be pregnant. There is a potential for serious side effects to an unborn child. Talk to your health care professional or pharmacist for more information. Do not breast-feed an infant while taking this medicine. What side effects may I notice from receiving this medicine? Side effects that you should report to your doctor or health care professional as soon as possible: -allergic reactions like skin rash, itching or hives, swelling of the face, lips, or tongue -low blood counts - This drug may decrease the number of white blood cells, red blood cells and platelets. You may be at increased risk for infections and bleeding. -signs of infection - fever or chills, cough, sore throat, pain or difficulty passing urine -signs of decreased platelets or bleeding - bruising, pinpoint red spots on the skin, black, tarry stools, nosebleeds -signs of decreased red blood cells - unusually weak or tired, fainting spells, lightheadedness -breathing problems -fast or irregular heartbeat -low blood pressure -mouth sores -nausea and vomiting -pain, swelling, redness or irritation at the injection site -pain, tingling, numbness in the hands or feet -swelling of the ankle, feet, hands -weight gain Side effects that usually do not require medical attention (report to your prescriber or health care professional if they continue or are bothersome): -bone pain -complete hair loss including hair on your head, underarms, pubic hair, eyebrows, and eyelashes -  diarrhea -excessive tearing -changes in the color of fingernails -loosening of the fingernails -nausea -muscle pain -red flush to skin -sweating -weak or tired This list may not describe all possible side effects. Call your doctor for medical advice about side effects. You may report side effects to FDA at 1-800-FDA-1088. Where should I keep my medicine? This drug is given in a hospital or clinic and will not be stored at  home. NOTE: This sheet is a summary. It may not cover all possible information. If you have questions about this medicine, talk to your doctor, pharmacist, or health care provider.    2016, Elsevier/Gold Standard. (2014-03-06 16:04:57)      Pegfilgrastim injection What is this medicine? PEGFILGRASTIM (PEG fil gra stim) is a long-acting granulocyte colony-stimulating factor that stimulates the growth of neutrophils, a type of white blood cell important in the body's fight against infection. It is used to reduce the incidence of fever and infection in patients with certain types of cancer who are receiving chemotherapy that affects the bone marrow, and to increase survival after being exposed to high doses of radiation. This medicine may be used for other purposes; ask your health care provider or pharmacist if you have questions. What should I tell my health care provider before I take this medicine? They need to know if you have any of these conditions: -kidney disease -latex allergy -ongoing radiation therapy -sickle cell disease -skin reactions to acrylic adhesives (On-Body Injector only) -an unusual or allergic reaction to pegfilgrastim, filgrastim, other medicines, foods, dyes, or preservatives -pregnant or trying to get pregnant -breast-feeding How should I use this medicine? This medicine is for injection under the skin. If you get this medicine at home, you will be taught how to prepare and give the pre-filled syringe or how to use the On-body Injector. Refer to the patient Instructions for Use for detailed instructions. Use exactly as directed. Take your medicine at regular intervals. Do not take your medicine more often than directed. It is important that you put your used needles and syringes in a special sharps container. Do not put them in a trash can. If you do not have a sharps container, call your pharmacist or healthcare provider to get one. Talk to your pediatrician  regarding the use of this medicine in children. While this drug may be prescribed for selected conditions, precautions do apply. Overdosage: If you think you have taken too much of this medicine contact a poison control center or emergency room at once. NOTE: This medicine is only for you. Do not share this medicine with others. What if I miss a dose? It is important not to miss your dose. Call your doctor or health care professional if you miss your dose. If you miss a dose due to an On-body Injector failure or leakage, a new dose should be administered as soon as possible using a single prefilled syringe for manual use. What may interact with this medicine? Interactions have not been studied. Give your health care provider a list of all the medicines, herbs, non-prescription drugs, or dietary supplements you use. Also tell them if you smoke, drink alcohol, or use illegal drugs. Some items may interact with your medicine. This list may not describe all possible interactions. Give your health care provider a list of all the medicines, herbs, non-prescription drugs, or dietary supplements you use. Also tell them if you smoke, drink alcohol, or use illegal drugs. Some items may interact with your medicine. What should I watch  for while using this medicine? You may need blood work done while you are taking this medicine. If you are going to need a MRI, CT scan, or other procedure, tell your doctor that you are using this medicine (On-Body Injector only). What side effects may I notice from receiving this medicine? Side effects that you should report to your doctor or health care professional as soon as possible: -allergic reactions like skin rash, itching or hives, swelling of the face, lips, or tongue -dizziness -fever -pain, redness, or irritation at site where injected -pinpoint red spots on the skin -red or dark-brown urine -shortness of breath or breathing problems -stomach or side pain, or  pain at the shoulder -swelling -tiredness -trouble passing urine or change in the amount of urine Side effects that usually do not require medical attention (report to your doctor or health care professional if they continue or are bothersome): -bone pain -muscle pain This list may not describe all possible side effects. Call your doctor for medical advice about side effects. You may report side effects to FDA at 1-800-FDA-1088. Where should I keep my medicine? Keep out of the reach of children. Store pre-filled syringes in a refrigerator between 2 and 8 degrees C (36 and 46 degrees F). Do not freeze. Keep in carton to protect from light. Throw away this medicine if it is left out of the refrigerator for more than 48 hours. Throw away any unused medicine after the expiration date. NOTE: This sheet is a summary. It may not cover all possible information. If you have questions about this medicine, talk to your doctor, pharmacist, or health care provider.    2016, Elsevier/Gold Standard. (2014-03-09 14:30:14)

## 2015-11-19 NOTE — Progress Notes (Signed)
Met with pt face to face.  Introduced myself and explain a little bit about my role as the patient navigator.  Pt given a card with all my information on it.  Told pt to call if they had any questions or concerns.  Pt verbalized understanding.

## 2015-11-19 NOTE — Patient Instructions (Signed)
[facility] at [facility] Discharge Instructions  RECOMMENDATIONS MADE BY THE CONSULTANT AND ANY TEST RESULTS WILL BE SENT TO YOUR REFERRING PHYSICIAN.  You saw Dr. Whitney Muse today. Follow up after Cycle 1 Day 1 with lab work. Start taxotere.  Thank you for choosing [place] at [facility] to provide your oncology and hematology care.  To afford each patient quality time with our provider, please arrive at least 15 minutes before your scheduled appointment time.   Beginning January 23rd 2017 lab work for the [facility] will be done in the  Main lab at [facility] on 1st floor. If you have a lab appointment with the [facility] please come in thru the  Main Entrance and check in at the main information desk  You need to re-schedule your appointment should you arrive 10 or more minutes late.  We strive to give you quality time with our providers, and arriving late affects you and other patients whose appointments are after yours.  Also, if you no show three or more times for appointments you may be dismissed from the clinic at the providers discretion.     Again, thank you for choosing [facility].  Our hope is that these requests will decrease the amount of time that you wait before being seen by our physicians.       _____________________________________________________________  Should you have questions after your visit to [facility], please contact our office at [phone] between the hours of 8:30 a.m. and 4:30 p.m.  Voicemails left after 4:30 p.m. will not be returned until the following business day.  For prescription refill requests, have your pharmacy contact our office.         Resources For Cancer Patients and their Caregivers ? American Cancer Society: Can assist with transportation, wigs, general needs, runs Look Good Feel Better.        (301)556-2204 ? Cancer Care: Provides financial  assistance, online support groups, medication/co-pay assistance.  1-800-813-HOPE (769)429-1150) ? [facility] Assists [place] cancer patients and their families through emotional , educational and financial support.  551-739-6420 ? [place] DSS Where to apply for food stamps, Medicaid and utility assistance. 615-750-3239 ? [facility]: Transportation to medical appointments. 380-769-6238 ? Social Security Administration: May apply for disability if have a Stage IV cancer. (364)056-1503 [phone]) 557-1535 ? [employer], Disability and Transit Services: Assists with nutrition, care and transit needs. [facility] Support Programs: @10RELATIVEDAYS @ > Cancer Support Group  2nd Tuesday of the month 1pm-2pm, Journey Room  > Creative Journey  3rd Tuesday of the month 1130am-1pm, Journey Room  > Look Good Feel Better  1st Wednesday of the month 10am-12 noon, Journey Room (Call [facility] to register [phone])

## 2015-11-19 NOTE — Progress Notes (Signed)
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 Yes PSA: X Gleason Primary: X Gleason Secondary: X Gleason Score: X AJCC M Stage: X AJCC N Stage: X Would you be surprised if this patient died  in the next year? I would be surprised if this patient died in the next year Line of therapy: Third Line and Beyond  Intent of Therapy: Non-Curative / Palliative Intent, Discussed with Patient

## 2015-11-20 ENCOUNTER — Other Ambulatory Visit (HOSPITAL_COMMUNITY): Payer: Self-pay | Admitting: Oncology

## 2015-11-21 ENCOUNTER — Encounter (HOSPITAL_COMMUNITY): Payer: Medicare Other

## 2015-11-21 ENCOUNTER — Ambulatory Visit (HOSPITAL_COMMUNITY)
Admission: RE | Admit: 2015-11-21 | Discharge: 2015-11-21 | Disposition: A | Discharge status: Home / Self Care | Payer: Medicare Other | Source: Ambulatory Visit | Attending: Hematology & Oncology | Admitting: Hematology & Oncology

## 2015-11-21 DIAGNOSIS — C7951 Secondary malignant neoplasm of bone: Secondary | ICD-10-CM | POA: Insufficient documentation

## 2015-11-21 DIAGNOSIS — C61 Malignant neoplasm of prostate: Secondary | ICD-10-CM | POA: Diagnosis not present

## 2015-11-21 NOTE — Progress Notes (Signed)
Peripherally Inserted Central Catheter/Midline Placement  The IV Nurse has discussed with the patient and/or persons authorized to consent for the patient, the purpose of this procedure and the potential benefits and risks involved with this procedure.  The benefits include less needle sticks, lab draws from the catheter, and the patient may be discharged home with the catheter. Risks include, but not limited to, infection, bleeding, blood clot (thrombus formation), and puncture of an artery; nerve damage and irregular heartbeat and possibility to perform a PICC exchange if needed/ordered by physician.  Alternatives to this procedure were also discussed.  Bard Power PICC patient education guide, fact sheet on infection prevention and patient information card has been provided to patient /or left at bedside.    PICC/Midline Placement Documentation  PICC Single Lumen 11/21/15 Right Brachial 42 cm 0 cm (Active)  Indication for Insertion or Continuance of Line Prolonged intravenous therapies 11/21/2015  2:23 PM  Exposed Catheter (cm) 0 cm 11/21/2015  2:23 PM  Site Assessment Clean;Dry;Intact 11/21/2015  2:23 PM  Line Status Flushed;Saline locked;Blood return noted 11/21/2015  2:23 PM  Dressing Type Transparent;Securing device 11/21/2015  2:23 PM  Dressing Status Clean;Dry;Intact;Antimicrobial disc in place 11/21/2015  2:23 PM  Line Care Connections checked and tightened 11/21/2015  2:23 PM  Dressing Intervention New dressing 11/21/2015  2:23 PM  Dressing Change Due 11/28/15 11/21/2015  2:23 PM       Hillery Jacks 11/21/2015, 2:24 PM

## 2015-11-21 NOTE — Discharge Instructions (Signed)
PICC Insertion, Care After °Refer to this sheet in the next few weeks. These instructions provide you with information on caring for yourself after your procedure. Your health care provider may also give you more specific instructions. Your treatment has been planned according to current medical practices, but problems sometimes occur. Call your health care provider if you have any problems or questions after your procedure. °WHAT TO EXPECT AFTER THE PROCEDURE °After your procedure, it is typical to have the following: °· Mild discomfort at the insertion site. This should not last more than a day. °HOME CARE INSTRUCTIONS °· Rest at home for the remainder of the day after the procedure. °· You may bend your arm and move it freely. If your PICC is near or at the bend of your elbow, avoid activity with repeated motion at the elbow. °· Avoid lifting heavy objects as instructed by your health care provider. °· Avoid using a crutch with the arm on the same side as your PICC. You may need to use a walker. °Bandage Care °· Keep your PICC bandage (dressing) clean and dry to prevent infection. °· Ask your health care provider when you may shower. To keep the dressing dry, cover the PICC with plastic wrap and tape before showering. If the dressing does become wet, replace it right after the shower. °· Do not soak in the bath, swim, or use hot tubs when you have a PICC. °· Change the PICC dressing as instructed by your health care provider. °· Change your PICC dressing if it becomes loose or wet. °General PICC Care °· Check the PICC insertion site daily for leakage, redness, swelling, or pain. °· Flush the PICC as directed by your health care provider. Let your health care provider know right away if the PICC is difficult to flush or does not flush. Do not use force to flush the PICC. °· Do not use a syringe that is less than 10 mL to flush the PICC. °· Never pull or tug on the PICC. °· Avoid blood pressure checks on the arm  with the PICC. °· Keep your PICC identification card with you at all times. °· Do not take the PICC out yourself. Only a trained health care professional should remove the PICC. °SEEK MEDICAL CARE IF: °· You have pain in your arm, ear, face, or teeth. °· You have fever or chills. °· You have drainage from the PICC insertion site. °· You have redness or palpate a "cord" around the PICC insertion site. °· You cannot flush the catheter. °SEEK IMMEDIATE MEDICAL CARE IF: °· You have swelling in the arm in which the PICC is inserted. °  °This information is not intended to replace advice given to you by your health care provider. Make sure you discuss any questions you have with your health care provider. °  °Document Released: 12/08/2012 Document Revised: 02/22/2013 Document Reviewed: 12/08/2012 °Elsevier Interactive Patient Education ©2016 Elsevier Inc. ° °

## 2015-11-22 ENCOUNTER — Telehealth (HOSPITAL_COMMUNITY): Payer: Self-pay | Admitting: *Deleted

## 2015-11-22 ENCOUNTER — Telehealth (HOSPITAL_COMMUNITY): Payer: Self-pay | Admitting: Emergency Medicine

## 2015-11-22 NOTE — Telephone Encounter (Signed)
Pt wanted to know if he is suppose to have a blue cap on the end of his PICC line.  I told him that he did not have to.  He thought that he had lost it.  He needed clarification on his steroid dose.

## 2015-11-23 ENCOUNTER — Encounter (HOSPITAL_BASED_OUTPATIENT_CLINIC_OR_DEPARTMENT_OTHER): Payer: Medicare Other

## 2015-11-23 VITALS — BP 175/80 | HR 64 | Temp 98.2°F | Resp 18 | Wt 267.2 lb

## 2015-11-23 DIAGNOSIS — C7951 Secondary malignant neoplasm of bone: Secondary | ICD-10-CM

## 2015-11-23 DIAGNOSIS — Z5111 Encounter for antineoplastic chemotherapy: Secondary | ICD-10-CM | POA: Diagnosis not present

## 2015-11-23 DIAGNOSIS — C61 Malignant neoplasm of prostate: Secondary | ICD-10-CM | POA: Diagnosis not present

## 2015-11-23 LAB — CBC WITH DIFFERENTIAL/PLATELET
Basophils Absolute: 0 10*3/uL (ref 0.0–0.1)
Basophils Relative: 0 %
EOS ABS: 0 10*3/uL (ref 0.0–0.7)
EOS PCT: 0 %
HCT: 34.2 % — ABNORMAL LOW (ref 39.0–52.0)
Hemoglobin: 11.6 g/dL — ABNORMAL LOW (ref 13.0–17.0)
LYMPHS ABS: 0.7 10*3/uL (ref 0.7–4.0)
LYMPHS PCT: 8 %
MCH: 30.6 pg (ref 26.0–34.0)
MCHC: 33.9 g/dL (ref 30.0–36.0)
MCV: 90.2 fL (ref 78.0–100.0)
MONO ABS: 0.2 10*3/uL (ref 0.1–1.0)
Monocytes Relative: 3 %
Neutro Abs: 7.6 10*3/uL (ref 1.7–7.7)
Neutrophils Relative %: 89 %
PLATELETS: 212 10*3/uL (ref 150–400)
RBC: 3.79 MIL/uL — AB (ref 4.22–5.81)
RDW: 13.2 % (ref 11.5–15.5)
WBC: 8.5 10*3/uL (ref 4.0–10.5)

## 2015-11-23 LAB — COMPREHENSIVE METABOLIC PANEL
ALT: 26 U/L (ref 17–63)
ANION GAP: 14 (ref 5–15)
AST: 32 U/L (ref 15–41)
Albumin: 4.3 g/dL (ref 3.5–5.0)
Alkaline Phosphatase: 203 U/L — ABNORMAL HIGH (ref 38–126)
BUN: 30 mg/dL — ABNORMAL HIGH (ref 6–20)
CHLORIDE: 101 mmol/L (ref 101–111)
CO2: 22 mmol/L (ref 22–32)
CREATININE: 1.19 mg/dL (ref 0.61–1.24)
Calcium: 10.1 mg/dL (ref 8.9–10.3)
GFR, EST NON AFRICAN AMERICAN: 60 mL/min — AB (ref >60)
Glucose, Bld: 222 mg/dL — ABNORMAL HIGH (ref 65–99)
POTASSIUM: 3.7 mmol/L (ref 3.5–5.1)
SODIUM: 137 mmol/L (ref 135–145)
Total Bilirubin: 0.9 mg/dL (ref 0.3–1.2)
Total Protein: 6.8 g/dL (ref 6.5–8.1)

## 2015-11-23 LAB — PSA: PSA: 49.48 ng/mL — AB (ref 0.00–4.00)

## 2015-11-23 MED ORDER — PEGFILGRASTIM 6 MG/0.6ML ~~LOC~~ PSKT
6.0000 mg | PREFILLED_SYRINGE | Freq: Once | SUBCUTANEOUS | Status: AC
  Administered 2015-11-23: 6 mg via SUBCUTANEOUS
  Filled 2015-11-23: qty 0.6

## 2015-11-23 MED ORDER — SODIUM CHLORIDE 0.9 % IV SOLN
10.0000 mg | Freq: Once | INTRAVENOUS | Status: AC
  Administered 2015-11-23: 10 mg via INTRAVENOUS
  Filled 2015-11-23: qty 1

## 2015-11-23 MED ORDER — HEPARIN SOD (PORK) LOCK FLUSH 100 UNIT/ML IV SOLN
500.0000 [IU] | Freq: Once | INTRAVENOUS | Status: AC | PRN
  Administered 2015-11-23: 500 [IU]
  Filled 2015-11-23: qty 5

## 2015-11-23 MED ORDER — SODIUM CHLORIDE 0.9% FLUSH
10.0000 mL | INTRAVENOUS | Status: DC | PRN
  Administered 2015-11-23: 10 mL
  Filled 2015-11-23: qty 10

## 2015-11-23 MED ORDER — SODIUM CHLORIDE 0.9 % IV SOLN
Freq: Once | INTRAVENOUS | Status: AC
  Administered 2015-11-23: 10:00:00 via INTRAVENOUS

## 2015-11-23 MED ORDER — DOCETAXEL CHEMO INJECTION 160 MG/16ML
75.0000 mg/m2 | Freq: Once | INTRAVENOUS | Status: AC
  Administered 2015-11-23: 180 mg via INTRAVENOUS
  Filled 2015-11-23: qty 18

## 2015-11-23 NOTE — Progress Notes (Signed)
Micheal Davis tolerated first time chemo tx well without complaints or incident today Lab results shown to Dr. Whitney Muse prior to administering chemo.Marland KitchenNeulasta on-pro applied to right arm and pt given instructions on when to remove tomorrow. Pt and wife both verbalized understanding.PICC line WNL and flushed per protocol. Pt discharged self ambulatory in satisfactory condition with wife

## 2015-11-23 NOTE — Patient Instructions (Signed)
[facility] Discharge Instructions for Patients Receiving Chemotherapy   Beginning January 23rd 2017 lab work for the [facility] will be done in the  Main lab at [facility] on 1st floor. If you have a lab appointment with the [facility] please come in thru the  Main Entrance and check in at the main information desk   Today you received the following chemotherapy agents Taxotere and Neulasta on-pro. Remove in 28 hours from time it was applied at clinic. Follow-up as scheduled. Call clinic for any questions or concerns  To help prevent nausea and vomiting after your treatment, we encourage you to take your nausea medication   If you develop nausea and vomiting, or diarrhea that is not controlled by your medication, call the clinic.  The clinic phone number is [phone]. Office hours are Monday-Friday 8:30am-5:00pm.  BELOW ARE SYMPTOMS THAT SHOULD BE REPORTED IMMEDIATELY:  *FEVER GREATER THAN 101.0 F  *CHILLS WITH OR WITHOUT FEVER  NAUSEA AND VOMITING THAT IS NOT CONTROLLED WITH YOUR NAUSEA MEDICATION  *UNUSUAL SHORTNESS OF BREATH  *UNUSUAL BRUISING OR BLEEDING  TENDERNESS IN MOUTH AND THROAT WITH OR WITHOUT PRESENCE OF ULCERS  *URINARY PROBLEMS  *BOWEL PROBLEMS  UNUSUAL RASH Items with * indicate a potential emergency and should be followed up as soon as possible. If you have an emergency after office hours please contact your primary care physician or go to the nearest emergency department.  Please call the clinic during office hours if you have any questions or concerns.   You may also contact the Patient Navigator at [phone] should you have any questions or need assistance in obtaining follow up care.      Resources For Cancer Patients and their Caregivers ? American Cancer Society: Can assist with transportation, wigs, general needs, runs Look Good Feel Better.        (450)448-9671 ? Cancer Care: Provides financial assistance,  online support groups, medication/co-pay assistance.  1-800-813-HOPE 934 449 6115) ? [place] Assists [place] cancer patients and their families through emotional , educational and financial support.  (873) 170-5713 ? [place] DSS Where to apply for food stamps, Medicaid and utility assistance. 248-317-1438 ? RCATS: Transportation to medical appointments. 979 411 4801 ? Social Security Administration: May apply for disability if have a Stage IV cancer. [phone] 310-085-7231 ? [employer], Disability and Transit Services: Assists with nutrition, care and transit needs. 561-251-4287

## 2015-11-26 ENCOUNTER — Telehealth (HOSPITAL_COMMUNITY): Payer: Self-pay | Admitting: *Deleted

## 2015-11-26 NOTE — Telephone Encounter (Signed)
Spoke with patient. Denies any complaints post chemo, only reports feeling "tired'.

## 2015-11-27 ENCOUNTER — Encounter (HOSPITAL_COMMUNITY): Payer: Self-pay

## 2015-11-27 ENCOUNTER — Encounter (HOSPITAL_BASED_OUTPATIENT_CLINIC_OR_DEPARTMENT_OTHER): Payer: Medicare Other

## 2015-11-27 DIAGNOSIS — C61 Malignant neoplasm of prostate: Secondary | ICD-10-CM

## 2015-11-27 DIAGNOSIS — Z452 Encounter for adjustment and management of vascular access device: Secondary | ICD-10-CM | POA: Diagnosis not present

## 2015-11-27 DIAGNOSIS — C7951 Secondary malignant neoplasm of bone: Secondary | ICD-10-CM | POA: Diagnosis not present

## 2015-11-27 MED ORDER — HEPARIN SOD (PORK) LOCK FLUSH 100 UNIT/ML IV SOLN
250.0000 [IU] | Freq: Once | INTRAVENOUS | Status: AC
  Administered 2015-11-27: 250 [IU] via INTRAVENOUS

## 2015-11-27 MED ORDER — SODIUM CHLORIDE 0.9% FLUSH
10.0000 mL | INTRAVENOUS | Status: DC | PRN
  Administered 2015-11-27: 10 mL via INTRAVENOUS
  Filled 2015-11-27: qty 10

## 2015-11-27 NOTE — Progress Notes (Signed)
Micheal Davis presented for PICC flush and dressing change.  See IV assessment in docflowsheets for PICC details.  PICC located right arm.  Good blood return present. PICC flushed with 90ml NS and 250 U Heparin, see MAR for further details.  Micheal Davis tolerated procedure well and without incident.

## 2015-11-27 NOTE — Patient Instructions (Signed)
[place] at [facility] Discharge Instructions  RECOMMENDATIONS MADE BY THE CONSULTANT AND ANY TEST RESULTS WILL BE SENT TO YOUR REFERRING PHYSICIAN.  PICC flush and dressing change today. Return as scheduled for office visit this Friday. PICC flush this Friday. Call the clinic should you have any questions or concerns prior to your next appointment.   Thank you for choosing [place] at [facility] to provide your oncology and hematology care.  To afford each patient quality time with our provider, please arrive at least 15 minutes before your scheduled appointment time.   Beginning January 23rd 2017 lab work for the [facility] will be done in the  Main lab at [facility] on 1st floor. If you have a lab appointment with the [facility] please come in thru the  Main Entrance and check in at the main information desk  You need to re-schedule your appointment should you arrive 10 or more minutes late.  We strive to give you quality time with our providers, and arriving late affects you and other patients whose appointments are after yours.  Also, if you no show three or more times for appointments you may be dismissed from the clinic at the providers discretion.     Again, thank you for choosing [facility].  Our hope is that these requests will decrease the amount of time that you wait before being seen by our physicians.       _____________________________________________________________  Should you have questions after your visit to [facility], please contact our office at [phone] between the hours of 8:30 a.m. and 4:30 p.m.  Voicemails left after 4:30 p.m. will not be returned until the following business day.  For prescription refill requests, have your pharmacy contact our office.         Resources For Cancer Patients and their Caregivers ? American Cancer Society: Can assist with transportation,  wigs, general needs, runs Look Good Feel Better.        406-023-5482 ? Cancer Care: Provides financial assistance, online support groups, medication/co-pay assistance.  1-800-813-HOPE (434) 105-4738) ? [place] Assists [place] cancer patients and their families through emotional , educational and financial support.  970 019 3335 ? [place] DSS Where to apply for food stamps, Medicaid and utility assistance. 870-472-2261 ? RCATS: Transportation to medical appointments. 351-140-0002 ? Social Security Administration: May apply for disability if have a Stage IV cancer. (636) 678-1100 (817)641-6899 ? [employer], Disability and Transit Services: Assists with nutrition, care and transit needs. [place] Support Programs: @10RELATIVEDAYS @ > Cancer Support Group  2nd Tuesday of the month 1pm-2pm, Journey Room  > Creative Journey  3rd Tuesday of the month 1130am-1pm, Journey Room  > Look Good Feel Better  1st Wednesday of the month 10am-12 noon, Journey Room (Call [place] to register [phone])

## 2015-11-30 ENCOUNTER — Encounter (HOSPITAL_COMMUNITY): Payer: Self-pay | Admitting: Hematology & Oncology

## 2015-11-30 ENCOUNTER — Encounter (HOSPITAL_BASED_OUTPATIENT_CLINIC_OR_DEPARTMENT_OTHER): Payer: Medicare Other

## 2015-11-30 ENCOUNTER — Encounter (HOSPITAL_BASED_OUTPATIENT_CLINIC_OR_DEPARTMENT_OTHER): Payer: Medicare Other | Admitting: Hematology & Oncology

## 2015-11-30 VITALS — BP 156/87 | HR 72 | Temp 97.7°F | Resp 16 | Wt 257.3 lb

## 2015-11-30 DIAGNOSIS — C7951 Secondary malignant neoplasm of bone: Secondary | ICD-10-CM | POA: Diagnosis not present

## 2015-11-30 DIAGNOSIS — T451X5A Adverse effect of antineoplastic and immunosuppressive drugs, initial encounter: Secondary | ICD-10-CM

## 2015-11-30 DIAGNOSIS — G893 Neoplasm related pain (acute) (chronic): Secondary | ICD-10-CM | POA: Diagnosis not present

## 2015-11-30 DIAGNOSIS — C61 Malignant neoplasm of prostate: Secondary | ICD-10-CM

## 2015-11-30 DIAGNOSIS — K1379 Other lesions of oral mucosa: Secondary | ICD-10-CM

## 2015-11-30 DIAGNOSIS — R197 Diarrhea, unspecified: Secondary | ICD-10-CM

## 2015-11-30 DIAGNOSIS — K521 Toxic gastroenteritis and colitis: Secondary | ICD-10-CM

## 2015-11-30 LAB — CBC WITH DIFFERENTIAL/PLATELET
BASOS ABS: 0 10*3/uL (ref 0.0–0.1)
Basophils Relative: 0 %
EOS PCT: 1 %
Eosinophils Absolute: 0.2 10*3/uL (ref 0.0–0.7)
HEMATOCRIT: 34.7 % — AB (ref 39.0–52.0)
HEMOGLOBIN: 11.9 g/dL — AB (ref 13.0–17.0)
LYMPHS ABS: 1 10*3/uL (ref 0.7–4.0)
LYMPHS PCT: 6 %
MCH: 31.4 pg (ref 26.0–34.0)
MCHC: 34.3 g/dL (ref 30.0–36.0)
MCV: 91.6 fL (ref 78.0–100.0)
MONOS PCT: 7 %
Monocytes Absolute: 1.2 10*3/uL — ABNORMAL HIGH (ref 0.1–1.0)
NEUTROS ABS: 15 10*3/uL — AB (ref 1.7–7.7)
Neutrophils Relative %: 86 %
Platelets: 153 10*3/uL (ref 150–400)
RBC: 3.79 MIL/uL — AB (ref 4.22–5.81)
RDW: 13.1 % (ref 11.5–15.5)
WBC MORPHOLOGY: INCREASED
WBC: 17.4 10*3/uL — ABNORMAL HIGH (ref 4.0–10.5)

## 2015-11-30 LAB — COMPREHENSIVE METABOLIC PANEL
ALK PHOS: 163 U/L — AB (ref 38–126)
ALT: 25 U/L (ref 17–63)
AST: 25 U/L (ref 15–41)
Albumin: 3.8 g/dL (ref 3.5–5.0)
Anion gap: 10 (ref 5–15)
BILIRUBIN TOTAL: 0.7 mg/dL (ref 0.3–1.2)
BUN: 22 mg/dL — AB (ref 6–20)
CALCIUM: 9.5 mg/dL (ref 8.9–10.3)
CO2: 25 mmol/L (ref 22–32)
CREATININE: 1.03 mg/dL (ref 0.61–1.24)
Chloride: 102 mmol/L (ref 101–111)
Glucose, Bld: 189 mg/dL — ABNORMAL HIGH (ref 65–99)
Potassium: 3.6 mmol/L (ref 3.5–5.1)
Sodium: 137 mmol/L (ref 135–145)
TOTAL PROTEIN: 6.4 g/dL — AB (ref 6.5–8.1)

## 2015-11-30 MED ORDER — MAGIC MOUTHWASH W/LIDOCAINE: ORAL | 1 refills | Status: DC

## 2015-11-30 MED ORDER — HEPARIN SOD (PORK) LOCK FLUSH 100 UNIT/ML IV SOLN
300.0000 [IU] | Freq: Once | INTRAVENOUS | Status: AC
  Administered 2015-11-30: 300 [IU] via INTRAVENOUS

## 2015-11-30 MED ORDER — HEPARIN SOD (PORK) LOCK FLUSH 100 UNIT/ML IV SOLN
INTRAVENOUS | Status: AC
  Filled 2015-11-30: qty 5

## 2015-11-30 MED ORDER — LOPERAMIDE HCL 2 MG PO CAPS
4.0000 mg | ORAL_CAPSULE | ORAL | Status: DC | PRN
  Administered 2015-11-30: 4 mg via ORAL
  Filled 2015-11-30: qty 2

## 2015-11-30 MED ORDER — SODIUM CHLORIDE 0.9 % IV SOLN
INTRAVENOUS | Status: DC
  Administered 2015-11-30: 10:00:00 via INTRAVENOUS

## 2015-11-30 NOTE — Patient Instructions (Signed)
[place] at [facility] Discharge Instructions  RECOMMENDATIONS MADE BY THE CONSULTANT AND ANY TEST RESULTS WILL BE SENT TO YOUR REFERRING PHYSICIAN.  IV fluids and labs today.    Thank you for choosing [place] at [facility] to provide your oncology and hematology care.  To afford each patient quality time with our provider, please arrive at least 15 minutes before your scheduled appointment time.   Beginning January 23rd 2017 lab work for the [facility] will be done in the  Main lab at [facility] on 1st floor. If you have a lab appointment with the [facility] please come in thru the  Main Entrance and check in at the main information desk  You need to re-schedule your appointment should you arrive 10 or more minutes late.  We strive to give you quality time with our providers, and arriving late affects you and other patients whose appointments are after yours.  Also, if you no show three or more times for appointments you may be dismissed from the clinic at the providers discretion.     Again, thank you for choosing [facility].  Our hope is that these requests will decrease the amount of time that you wait before being seen by our physicians.       _____________________________________________________________  Should you have questions after your visit to [facility], please contact our office at [phone] between the hours of 8:30 a.m. and 4:30 p.m.  Voicemails left after 4:30 p.m. will not be returned until the following business day.  For prescription refill requests, have your pharmacy contact our office.         Resources For Cancer Patients and their Caregivers ? American Cancer Society: Can assist with transportation, wigs, general needs, runs Look Good Feel Better.        706-430-6321 ? Cancer Care: Provides financial assistance, online support groups, medication/co-pay assistance.   1-800-813-HOPE 2017252808) ? [place] Assists [place] cancer patients and their families through emotional , educational and financial support.  201-824-4105 ? [place] DSS Where to apply for food stamps, Medicaid and utility assistance. (470)443-5295 ? RCATS: Transportation to medical appointments. 234 095 6860 ? Social Security Administration: May apply for disability if have a Stage IV cancer. 787-835-7426 618 811 4172 ? [employer], Disability and Transit Services: Assists with nutrition, care and transit needs. [place] Support Programs: @10RELATIVEDAYS @ > Cancer Support Group  2nd Tuesday of the month 1pm-2pm, Journey Room  > Creative Journey  3rd Tuesday of the month 1130am-1pm, Journey Room  > Look Good Feel Better  1st Wednesday of the month 10am-12 noon, Journey Room (Call [place] to register [phone])
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
## 2015-11-30 NOTE — Patient Instructions (Addendum)
[place] at [facility] Discharge Instructions  RECOMMENDATIONS MADE BY THE CONSULTANT AND ANY TEST RESULTS WILL BE SENT TO YOUR REFERRING PHYSICIAN.  You saw Dr. Whitney Muse today. Follow up prior to next chemo cycle with labs. Call if throat pain or diarrhea worsens.  Thank you for choosing [place] at [facility] to provide your oncology and hematology care.  To afford each patient quality time with our provider, please arrive at least 15 minutes before your scheduled appointment time.   Beginning January 23rd 2017 lab work for the [facility] will be done in the  Main lab at [facility] on 1st floor. If you have a lab appointment with the [facility] please come in thru the  Main Entrance and check in at the main information desk  You need to re-schedule your appointment should you arrive 10 or more minutes late.  We strive to give you quality time with our providers, and arriving late affects you and other patients whose appointments are after yours.  Also, if you no show three or more times for appointments you may be dismissed from the clinic at the providers discretion.     Again, thank you for choosing [facility].  Our hope is that these requests will decrease the amount of time that you wait before being seen by our physicians.       _____________________________________________________________  Should you have questions after your visit to [facility], please contact our office at [phone] between the hours of 8:30 a.m. and 4:30 p.m.  Voicemails left after 4:30 p.m. will not be returned until the following business day.  For prescription refill requests, have your pharmacy contact our office.         Resources For Cancer Patients and their Caregivers ? American Cancer Society: Can assist with transportation, wigs, general needs, runs Look Good Feel Better.        224 338 9939 ? Cancer  Care: Provides financial assistance, online support groups, medication/co-pay assistance.  1-800-813-HOPE 408-770-8731) ? [place] Assists [place] cancer patients and their families through emotional , educational and financial support.  440-549-1957 ? [place] DSS Where to apply for food stamps, Medicaid and utility assistance. (210)449-4182 ? RCATS: Transportation to medical appointments. (564)349-2149 ? Social Security Administration: May apply for disability if have a Stage IV cancer. (940) 358-5311 (854)723-1235 ? [employer], Disability and Transit Services: Assists with nutrition, care and transit needs. [place] Support Programs: @10RELATIVEDAYS @ > Cancer Support Group  2nd Tuesday of the month 1pm-2pm, Journey Room  > Creative Journey  3rd Tuesday of the month 1130am-1pm, Journey Room  > Look Good Feel Better  1st Wednesday of the month 10am-12 noon, Journey Room (Call [place] to register [phone])   Diarrhea Diarrhea is frequent loose and watery bowel movements. It can cause you to feel weak and dehydrated. Dehydration can cause you to become tired and thirsty, have a dry mouth, and have decreased urination that often is dark yellow. Diarrhea is a sign of another problem, most often an infection that will not last long. In most cases, diarrhea typically lasts 2-3 days. However, it can last longer if it is a sign of something more serious. It is important to treat your diarrhea as directed by your caregiver to lessen or prevent future episodes of diarrhea. CAUSES  Some common causes include:  Gastrointestinal infections caused by viruses, bacteria,  or parasites.  Food poisoning or food allergies.  Certain medicines, such as antibiotics, chemotherapy, and laxatives.  Artificial sweeteners and fructose.  Digestive disorders. HOME CARE INSTRUCTIONS  Ensure adequate fluid intake (hydration): Have 1  cup (8 oz) of fluid for each diarrhea episode. Avoid fluids that contain simple sugars or sports drinks, fruit juices, whole milk products, and sodas. Your urine should be clear or pale yellow if you are drinking enough fluids. Hydrate with an oral rehydration solution that you can purchase at pharmacies, retail stores, and online. You can prepare an oral rehydration solution at home by mixing the following ingredients together:   - tsp table salt.   tsp baking soda.   tsp salt substitute containing potassium chloride.  1  tablespoons sugar.  1 L (34 oz) of water.  Certain foods and beverages may increase the speed at which food moves through the gastrointestinal (GI) tract. These foods and beverages should be avoided and include:  Caffeinated and alcoholic beverages.  High-fiber foods, such as raw fruits and vegetables, nuts, seeds, and whole grain breads and cereals.  Foods and beverages sweetened with sugar alcohols, such as xylitol, sorbitol, and mannitol.  Some foods may be well tolerated and may help thicken stool including:  Starchy foods, such as rice, toast, pasta, low-sugar cereal, oatmeal, grits, baked potatoes, crackers, and bagels.  Bananas.  Applesauce.  Add probiotic-rich foods to help increase healthy bacteria in the GI tract, such as yogurt and fermented milk products.  Wash your hands well after each diarrhea episode.  Only take over-the-counter or prescription medicines as directed by your caregiver.  Take a warm bath to relieve any burning or pain from frequent diarrhea episodes. SEEK IMMEDIATE MEDICAL CARE IF:   You are unable to keep fluids down.  You have persistent vomiting.  You have blood in your stool, or your stools are black and tarry.  You do not urinate in 6-8 hours, or there is only a small amount of very dark urine.  You have abdominal pain that increases or localizes.  You have weakness, dizziness, confusion, or  light-headedness.  You have a severe headache.  Your diarrhea gets worse or does not get better.  You have a fever or persistent symptoms for more than 2-3 days.  You have a fever and your symptoms suddenly get worse. MAKE SURE YOU:   Understand these instructions.  Will watch your condition.  Will get help right away if you are not doing well or get worse.   This information is not intended to replace advice given to you by your health care provider. Make sure you discuss any questions you have with your health care provider.   Document Released: 02/07/2002 Document Revised: 03/10/2014 Document Reviewed: 10/26/2011 Elsevier Interactive Patient Education [employer].

## 2015-11-30 NOTE — Progress Notes (Signed)
Patient tolerated IVF well.  VSS.  Patient educated twice on Imodium usage and states that he knows he has some at home as well as the sheet on how and when to use it.  He states that he will use it at the first sign of diarrhea going forward and has had no bowel movements since his dose of Imodium here at the clinic.  Magic Mouthwash called in to [place] and the patient aware.  They were instructed on how to use it and verbalize understanding.  Patient ambulatory and stable at discharge from clinic.

## 2015-12-01 ENCOUNTER — Encounter (HOSPITAL_COMMUNITY): Payer: Self-pay | Admitting: Hematology & Oncology

## 2015-12-04 ENCOUNTER — Encounter (HOSPITAL_COMMUNITY): Payer: Self-pay

## 2015-12-04 ENCOUNTER — Encounter (HOSPITAL_COMMUNITY): Payer: Medicare Other | Attending: Hematology & Oncology

## 2015-12-04 VITALS — BP 162/83 | HR 66 | Temp 98.7°F | Resp 18

## 2015-12-04 DIAGNOSIS — C61 Malignant neoplasm of prostate: Secondary | ICD-10-CM

## 2015-12-04 DIAGNOSIS — C7951 Secondary malignant neoplasm of bone: Secondary | ICD-10-CM | POA: Diagnosis not present

## 2015-12-04 DIAGNOSIS — Z452 Encounter for adjustment and management of vascular access device: Secondary | ICD-10-CM

## 2015-12-04 MED ORDER — SODIUM CHLORIDE 0.9% FLUSH
10.0000 mL | INTRAVENOUS | Status: DC | PRN
  Administered 2015-12-04: 10 mL via INTRAVENOUS
  Filled 2015-12-04: qty 10

## 2015-12-04 MED ORDER — HEPARIN SOD (PORK) LOCK FLUSH 100 UNIT/ML IV SOLN
500.0000 [IU] | Freq: Once | INTRAVENOUS | Status: AC
  Administered 2015-12-04: 500 [IU] via INTRAVENOUS
  Filled 2015-12-04: qty 5

## 2015-12-04 NOTE — Patient Instructions (Signed)
[place] at [facility] Discharge Instructions  RECOMMENDATIONS MADE BY THE CONSULTANT AND ANY TEST RESULTS WILL BE SENT TO YOUR REFERRING PHYSICIAN. PICC line flushed and dressing changed today. Follow up as scheduled  Thank you for choosing [place] at [facility] to provide your oncology and hematology care.  To afford each patient quality time with our provider, please arrive at least 15 minutes before your scheduled appointment time.   Beginning January 23rd 2017 lab work for the [facility] will be done in the  Main lab at [facility] on 1st floor. If you have a lab appointment with the [facility] please come in thru the  Main Entrance and check in at the main information desk  You need to re-schedule your appointment should you arrive 10 or more minutes late.  We strive to give you quality time with our providers, and arriving late affects you and other patients whose appointments are after yours.  Also, if you no show three or more times for appointments you may be dismissed from the clinic at the providers discretion.     Again, thank you for choosing [facility].  Our hope is that these requests will decrease the amount of time that you wait before being seen by our physicians.       _____________________________________________________________  Should you have questions after your visit to [facility], please contact our office at [phone] between the hours of 8:30 a.m. and 4:30 p.m.  Voicemails left after 4:30 p.m. will not be returned until the following business day.  For prescription refill requests, have your pharmacy contact our office.         Resources For Cancer Patients and their Caregivers ? American Cancer Society: Can assist with transportation, wigs, general needs, runs Look Good Feel Better.        ([phone] ? Cancer Care: Provides financial assistance, online support  groups, medication/co-pay assistance.  1-800-813-HOPE 3186859436) ? [place] Assists [place] cancer patients and their families through emotional , educational and financial support.  8586534409 ? [place] DSS Where to apply for food stamps, Medicaid and utility assistance. 817-714-3360 ? RCATS: Transportation to medical appointments. 3177904614 ? Social Security Administration: May apply for disability if have a Stage IV cancer. 6512259313 (707)591-1353 ? [employer], Disability and Transit Services: Assists with nutrition, care and transit needs. [place] Support Programs: @10RELATIVEDAYS @ > Cancer Support Group  2nd Tuesday of the month 1pm-2pm, Journey Room  > Creative Journey  3rd Tuesday of the month 1130am-1pm, Journey Room  > Look Good Feel Better  1st Wednesday of the month 10am-12 noon, Journey Room (Call [place] to register [phone])

## 2015-12-04 NOTE — Progress Notes (Signed)
Micheal Davis presented for PICC line dressing change.  See IV assessment in docflowsheets for PICC details.   PICC located right upper arm.  Good blood return present. PICC flushed with 61ml NS and 250U Heparin, see MAR for further details.  Micheal Davis tolerated procedure well and without incident.

## 2015-12-07 ENCOUNTER — Encounter (HOSPITAL_BASED_OUTPATIENT_CLINIC_OR_DEPARTMENT_OTHER): Payer: Medicare Other

## 2015-12-07 ENCOUNTER — Encounter (HOSPITAL_COMMUNITY): Payer: Self-pay

## 2015-12-07 DIAGNOSIS — Z452 Encounter for adjustment and management of vascular access device: Secondary | ICD-10-CM

## 2015-12-07 DIAGNOSIS — C7951 Secondary malignant neoplasm of bone: Secondary | ICD-10-CM | POA: Diagnosis not present

## 2015-12-07 DIAGNOSIS — C61 Malignant neoplasm of prostate: Secondary | ICD-10-CM | POA: Diagnosis not present

## 2015-12-07 MED ORDER — SODIUM CHLORIDE 0.9% FLUSH
10.0000 mL | INTRAVENOUS | Status: DC | PRN
  Administered 2015-12-07: 10 mL via INTRAVENOUS
  Filled 2015-12-07: qty 10

## 2015-12-07 MED ORDER — HEPARIN SOD (PORK) LOCK FLUSH 100 UNIT/ML IV SOLN
250.0000 [IU] | Freq: Once | INTRAVENOUS | Status: AC
  Administered 2015-12-07: 250 [IU] via INTRAVENOUS
  Filled 2015-12-07: qty 5

## 2015-12-07 NOTE — Patient Instructions (Signed)
[place] at [facility] Discharge Instructions  RECOMMENDATIONS MADE BY THE CONSULTANT AND ANY TEST RESULTS WILL BE SENT TO YOUR REFERRING PHYSICIAN.  PICC flush today. Return as scheduled for chemotherapy. Return as scheduled for office visit. Return as scheduled for PICC flushes.   Thank you for choosing [place] at [facility] to provide your oncology and hematology care.  To afford each patient quality time with our provider, please arrive at least 15 minutes before your scheduled appointment time.   Beginning January 23rd 2017 lab work for the [facility] will be done in the  Main lab at [facility] on 1st floor. If you have a lab appointment with the [facility] please come in thru the  Main Entrance and check in at the main information desk  You need to re-schedule your appointment should you arrive 10 or more minutes late.  We strive to give you quality time with our providers, and arriving late affects you and other patients whose appointments are after yours.  Also, if you no show three or more times for appointments you may be dismissed from the clinic at the providers discretion.     Again, thank you for choosing [facility].  Our hope is that these requests will decrease the amount of time that you wait before being seen by our physicians.       _____________________________________________________________  Should you have questions after your visit to [facility], please contact our office at [phone] between the hours of 8:30 a.m. and 4:30 p.m.  Voicemails left after 4:30 p.m. will not be returned until the following business day.  For prescription refill requests, have your pharmacy contact our office.         Resources For Cancer Patients and their Caregivers ? American Cancer Society: Can assist with transportation, wigs, general needs, runs Look Good Feel Better.         415-829-7216 ? Cancer Care: Provides financial assistance, online support groups, medication/co-pay assistance.  1-800-813-HOPE (513)747-7413) ? [facility] Assists [place] cancer patients and their families through emotional , educational and financial support.  619 833 3979 ? [place] DSS Where to apply for food stamps, Medicaid and utility assistance. (979)853-6960 ? RCATS: Transportation to medical appointments. (838) 854-5077 ? Social Security Administration: May apply for disability if have a Stage IV cancer. 219-360-1478 469-779-9385 ? [employer], Disability and Transit Services: Assists with nutrition, care and transit needs. [place] Support Programs: @10RELATIVEDAYS @ > Cancer Support Group  2nd Tuesday of the month 1pm-2pm, Journey Room  > Creative Journey  3rd Tuesday of the month 1130am-1pm, Journey Room  > Look Good Feel Better  1st Wednesday of the month 10am-12 noon, Journey Room (Call [place] to register [phone])

## 2015-12-07 NOTE — Progress Notes (Signed)
Micheal Davis presented for PICC flush.  See IV assessment in docflowsheets for PICC details. PICC located right arm.  Good blood return present. PICC flushed with 31ml NS and 250U Heparin, see MAR for further details.  Micheal Davis tolerated procedure well and without incident.

## 2015-12-11 ENCOUNTER — Encounter (HOSPITAL_BASED_OUTPATIENT_CLINIC_OR_DEPARTMENT_OTHER): Payer: Medicare Other

## 2015-12-11 ENCOUNTER — Encounter (HOSPITAL_COMMUNITY): Payer: Self-pay

## 2015-12-11 DIAGNOSIS — C61 Malignant neoplasm of prostate: Secondary | ICD-10-CM

## 2015-12-11 DIAGNOSIS — C7951 Secondary malignant neoplasm of bone: Secondary | ICD-10-CM | POA: Diagnosis not present

## 2015-12-11 DIAGNOSIS — Z452 Encounter for adjustment and management of vascular access device: Secondary | ICD-10-CM

## 2015-12-11 MED ORDER — HEPARIN SOD (PORK) LOCK FLUSH 100 UNIT/ML IV SOLN: 500.0000 [IU] | Freq: Once | INTRAVENOUS | Status: DC

## 2015-12-11 MED ORDER — HEPARIN SOD (PORK) LOCK FLUSH 100 UNIT/ML IV SOLN
250.0000 [IU] | Freq: Once | INTRAVENOUS | Status: AC
  Administered 2015-12-11: 250 [IU] via INTRAVENOUS

## 2015-12-11 MED ORDER — SODIUM CHLORIDE 0.9% FLUSH
10.0000 mL | INTRAVENOUS | Status: DC | PRN
  Administered 2015-12-11: 10 mL via INTRAVENOUS
  Filled 2015-12-11: qty 10

## 2015-12-11 NOTE — Patient Instructions (Signed)
[facility] at [facility] Discharge Instructions  RECOMMENDATIONS MADE BY THE CONSULTANT AND ANY TEST RESULTS WILL BE SENT TO YOUR REFERRING PHYSICIAN.  PICC flush and dressing change today. Return as scheduled for chemotherapy.   Thank you for choosing [facility] at [facility] to provide your oncology and hematology care.  To afford each patient quality time with our provider, please arrive at least 15 minutes before your scheduled appointment time.   Beginning January 23rd 2017 lab work for the Cancer Center will be done in the  Main lab at [place] on 1st floor. If you have a lab appointment with the Cancer Center please come in thru the  Main Entrance and check in at the main information desk  You need to re-schedule your appointment should you arrive 10 or more minutes late.  We strive to give you quality time with our providers, and arriving late affects you and other patients whose appointments are after yours.  Also, if you no show three or more times for appointments you may be dismissed from the clinic at the providers discretion.     Again, thank you for choosing [facility].  Our hope is that these requests will decrease the amount of time that you wait before being seen by our physicians.       _____________________________________________________________  Should you have questions after your visit to  Cancer Center, please contact our office at [phone] between the hours of 8:30 a.m. and 4:30 p.m.  Voicemails left after 4:30 p.m. will not be returned until the following business day.  For prescription refill requests, have your pharmacy contact our office.         Resources For Cancer Patients and their Caregivers ? American Cancer Society: Can assist with transportation, wigs, general needs, runs Look Good Feel Better.        1-888-227-6333 ? Cancer Care: Provides financial assistance, online  support groups, medication/co-pay assistance.  1-800-813-HOPE (4673) ? [facility] Assists [place] cancer patients and their families through emotional , educational and financial support.  336-427-4357 ? [place] DSS Where to apply for food stamps, Medicaid and utility assistance. 336-342-1394 ? RCATS: Transportation to medical appointments. 336-347-2287 ? Social Security Administration: May apply for disability if have a Stage IV cancer. 336-342-7796 1-800-772-1213 ? [place] Aging, Disability and Transit Services: Assists with nutrition, care and transit needs. 336-349-2343  Cancer Center Support Programs: @10RELATIVEDAYS@ > Cancer Support Group  2nd Tuesday of the month 1pm-2pm, Journey Room  > Creative Journey  3rd Tuesday of the month 1130am-1pm, Journey Room  > Look Good Feel Better  1st Wednesday of the month 10am-12 noon, Journey Room (Call American Cancer Society to register [phone])   

## 2015-12-11 NOTE — Progress Notes (Signed)
Micheal Davis presented for PICC dressing change and flush.  See IV assessment in docflowsheets for PICC details. PICC located right arm.  Good blood return present. PICC flushed with 55ml NS and 250U Heparin, see MAR for further details.  Micheal Davis tolerated procedure well and without incident.

## 2015-12-13 NOTE — Progress Notes (Addendum)
Micheal Labrum, MD [place] [id number]  Prostate cancer metastatic to bone [facility]) - Plan: oxyCODONE-acetaminophen (PERCOCET) 10-325 MG tablet  CURRENT THERAPY: Docetaxel beginning on 11/23/2015  INTERVAL HISTORY: Micheal Davis 70 y.o. 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  Currently on Depo-Lupron monthly and systemic chemotherapy with Docetaxel beginning on 11/23/2015.  Previous treatments include: Casodex, Zytiga + Prednisone, and Xtandi.  Additionally, he recently complete palliative XRT to left femur.    Prostate cancer metastatic to bone ([place])   10/01/2012 Initial Diagnosis    Prostate biopsied with highest Gleason score of 9 seen and the lowest score was 7.      10/04/2012 - 05/16/2013 Chemotherapy    Depo-Lupron and Casodex initiated      05/16/2013 -  Chemotherapy    Depo-Lupron monthly continued      05/16/2013 Progression    Progression by PSA elevation      05/16/2013 - 10/22/2014 Chemotherapy    Abiraterone and prednisone initiated in conjunction with ongoing Depo-Lupron.  Denosumab also ongoing.      10/23/2014 Progression    PSA increasing from 0.2- 1.6 in less than 6 months.        10/23/2014 - 01/30/2015 Chemotherapy    Enzalutamide and Prednisone (5 mg in AM and 2.5 mg in PM)      01/30/2015 Imaging    Bone scan- New focus of intense activity in right proximal humerus.  Interim increase in activity over left hip.      01/30/2015 Progression    Bone scan reveals new disease in right humerus consistent with progression of disease      01/31/2015 Imaging    Right humerus xray- blastic foci in proximal right humeral metaphysis and over right mid-humeral diaphysis.  No evidence of fracture      07/06/2015 Progression    Progression in multiple bones especially L hip and femurs      07/06/2015 Imaging    Bone scan-  progressive multifocal osseous metastases in the right proximal femora and distal femoral shafts.  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 Tammi Klippel      11/23/2015 -  Chemotherapy    The patient had pegfilgrastim (NEULASTA ONPRO KIT) injection 6 mg, 6 mg, Subcutaneous, Once, 1 of 4 cycles  DOCEtaxel (TAXOTERE) 180 mg in dextrose 5 % 250 mL chemo infusion, 75 mg/m2 = 180 mg, Intravenous,  Once, 1 of 4 cycles  for chemotherapy treatment.        11/30/2015 Adverse Reaction    Diarrhea (secondary to chemotherapy) and dehydration requiring IV fluids      12/14/2015 Treatment Plan Change    Docetaxel dose reduced by 15%      His diarrhea has resolved. His chemotherapy today has been dose reduced as a result of this complication. He reports that he is moving his bowels twice per day in the consistency is softly formed.  He otherwise denies any complaints. He does note some fatigue. He continues to work on a as needed basis.   Review of Systems  Constitutional: Positive for malaise/fatigue. Negative for chills, fever and weight loss.  HENT: Negative.   Eyes: Negative.   Respiratory: Negative.  Negative for cough, sputum  production and shortness of breath.   Cardiovascular: Negative.  Negative for chest pain and leg swelling.  Gastrointestinal: Negative.  Negative for diarrhea, nausea and vomiting.  Genitourinary: Negative.   Musculoskeletal: Negative.   Skin: Negative.   Neurological: Negative.  Negative for weakness.  Endo/Heme/Allergies: Negative.   Psychiatric/Behavioral: Negative.     Past Medical History:  Diagnosis Date  . Diabetes mellitus without complication ([place])   . Hypertension   . Prostate cancer ([place]) 09/05/2015  . Prostate cancer metastatic to bone ([place]) 09/05/2015  . Sleep apnea   . Thyroid disease     History reviewed. No pertinent surgical  history.  History reviewed. No pertinent family history.  Social History   Social History  . Marital status: Single    Spouse name: N/A  . Number of children: N/A  . Years of education: N/A   Social History Main Topics  . Smoking status: Never Smoker  . Smokeless tobacco: Never Used  . Alcohol use Yes     Comment: 1 beer each month  . Drug use: No  . Sexual activity: No     Comment: married   Other Topics Concern  . None   Social History Narrative  . None     PHYSICAL EXAMINATION  ECOG PERFORMANCE STATUS: 1 - Symptomatic but completely ambulatory  There were no vitals filed for this visit.  GENERAL:alert, no distress, well nourished, well developed, comfortable, cooperative, obese, smiling and accompanied by wife. SKIN: skin color, texture, turgor are normal, no rashes or significant lesions HEAD: Normocephalic, No masses, lesions, tenderness or abnormalities EYES: normal, EOMI, Conjunctiva are pink and non-injected EARS: External ears normal OROPHARYNX:lips, buccal mucosa, and tongue normal and mucous membranes are moist  NECK: supple, no adenopathy, trachea midline LYMPH:  no palpable lymphadenopathy BREAST:not examined LUNGS: clear to auscultation and percussion HEART: regular rate & rhythm, no murmurs, no gallops, S1 normal and S2 normal ABDOMEN:abdomen soft, obese and normal bowel sounds BACK: No CVA tenderness EXTREMITIES:less then 2 second capillary refill, no joint deformities, effusion, or inflammation, no skin discoloration, no cyanosis  NEURO: alert & oriented x 3 with fluent speech, no focal motor/sensory deficits, gait normal   LABORATORY DATA: CBC    Component Value Date/Time   WBC 9.3 12/14/2015 0919   RBC 3.53 (L) 12/14/2015 0919   HGB 11.1 (L) 12/14/2015 0919   HCT 32.2 (L) 12/14/2015 0919   PLT 220 12/14/2015 0919   MCV 91.2 12/14/2015 0919   MCH 31.4 12/14/2015 0919   MCHC 34.5 12/14/2015 0919   RDW 13.9 12/14/2015 0919   LYMPHSABS  0.7 12/14/2015 0919   MONOABS 0.3 12/14/2015 0919   EOSABS 0.0 12/14/2015 0919   BASOSABS 0.0 12/14/2015 0919      Chemistry      Component Value Date/Time   NA 137 12/14/2015 0919   K 3.9 12/14/2015 0919   CL 103 12/14/2015 0919   CO2 24 12/14/2015 0919   BUN 17 12/14/2015 0919   CREATININE 0.97 12/14/2015 0919      Component Value Date/Time   CALCIUM 9.7 12/14/2015 0919   ALKPHOS 161 (H) 12/14/2015 0919   AST 25 12/14/2015 0919   ALT 20 12/14/2015 0919   BILITOT 0.6 12/14/2015 0919     Lab Results  Component Value Date   PSA 49.48 (H) 11/23/2015   PSA 30.50 (H) 11/06/2015   PSA 20.48 (H) 10/08/2015     PENDING LABS:   RADIOGRAPHIC STUDIES:  No results found.   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  Currently on Depo-Lupron monthly and systemic chemotherapy with Docetaxel beginning on 11/23/2015.  Previous treatments include: Casodex, Zytiga + Prednisone, and Xtandi.  Additionally, he recently complete palliative XRT to left femur.  Oncology history is updated.  Pre-treatment labs: CBC diff, CMET, PSA.  I personally reviewed and went over laboratory results with the patient.  The results are noted within this dictation.  Depo-Lupron is due today.  Docetaxel will be dose-reduced due to chemotherapy-induced diarrhea with previous cycle of chemotherapy.  He is advised to start anti-diarrheal protocol immediately if necessary and call the clinic if no resolution in 24 hours.  He understands.  I have refilled his pain medication.  He uses infrequently.  [place] Controlled Substance Reporting System is reviewed.  He continues on Prednisone at 5 mg BID.  I suspect this is a medication that was started with Zytiga treatment and never discontinued.  It was last prescribed by Dr. Tressie Stalker.  At that  dose, it is physiologic dosing.  He will continue with this medication at this time and when he needs a refill, we will need to consider the role of ongoing Prednisone.  Return in 3 weeks for follow-up and cycle #3 of treatment.  Addendum: Patient reported to nursing that he was interested in port placement following completion of scheduled appointment today.  Will refer to Dr. Rhoderick Moody for port placement.  This will need to be placed close to his scheduled chemotherapy appointment to avoid time of Nad[place].   ORDERS PLACED FOR THIS ENCOUNTER: No orders of the defined types were placed in this encounter.   MEDICATIONS PRESCRIBED THIS ENCOUNTER: Meds ordered this encounter  Medications  . oxyCODONE-acetaminophen (PERCOCET) 10-325 MG tablet    Sig: Take 1 tablet by mouth every 6 (six) hours as needed for pain.    Dispense:  90 tablet    Refill:  0    Order Specific Question:   Supervising Provider    Answer:   Patrici Ranks [id number]    THERAPY PLAN:  Continue palliative chemotherapy as outlined above.  All questions were answered. The patient knows to call the clinic with any problems, questions or concerns. We can certainly see the patient much sooner if necessary.  Patient and plan discussed with Dr. Ancil Linsey and she is in agreement with the aforementioned.   This note is electronically signed by: Doy Mince 12/14/2015 10:31 AM

## 2015-12-13 NOTE — Assessment & Plan Note (Addendum)
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  Currently on Depo-Lupron monthly and systemic chemotherapy with Docetaxel beginning on 11/23/2015.  Previous treatments include: Casodex, Zytiga + Prednisone, and Xtandi.  Additionally, he recently complete palliative XRT to left femur.  Oncology history is updated.  Pre-treatment labs: CBC diff, CMET, PSA.  I personally reviewed and went over laboratory results with the patient.  The results are noted within this dictation.  Depo-Lupron is due today.  Docetaxel will be dose-reduced due to chemotherapy-induced diarrhea with previous cycle of chemotherapy.  He is advised to start anti-diarrheal protocol immediately if necessary and call the clinic if no resolution in 24 hours.  He understands.  I have refilled his pain medication.  He uses infrequently.  [place] Controlled Substance Reporting System is reviewed.  He continues on Prednisone at 5 mg BID.  I suspect this is a medication that was started with Zytiga treatment and never discontinued.  It was last prescribed by Dr. Tressie Stalker.  At that dose, it is physiologic dosing.  He will continue with this medication at this time and when he needs a refill, we will need to consider the role of ongoing Prednisone.  Return in 3 weeks for follow-up and cycle #3 of treatment.  Addendum: Patient reported to nursing that he was interested in port placement following completion of scheduled appointment today.  Will refer to Dr. Rhoderick Moody for port placement.  This will need to be placed close to his scheduled chemotherapy appointment to avoid time of Nadir.

## 2015-12-14 ENCOUNTER — Encounter (HOSPITAL_BASED_OUTPATIENT_CLINIC_OR_DEPARTMENT_OTHER): Payer: Medicare Other

## 2015-12-14 ENCOUNTER — Encounter (HOSPITAL_BASED_OUTPATIENT_CLINIC_OR_DEPARTMENT_OTHER): Payer: Medicare Other | Admitting: Oncology

## 2015-12-14 ENCOUNTER — Encounter (HOSPITAL_COMMUNITY): Payer: Self-pay | Admitting: Oncology

## 2015-12-14 ENCOUNTER — Encounter (HOSPITAL_COMMUNITY): Payer: Self-pay | Admitting: Lab

## 2015-12-14 VITALS — BP 160/88 | HR 55 | Temp 98.3°F | Resp 16

## 2015-12-14 DIAGNOSIS — Z5111 Encounter for antineoplastic chemotherapy: Secondary | ICD-10-CM | POA: Diagnosis not present

## 2015-12-14 DIAGNOSIS — C7951 Secondary malignant neoplasm of bone: Secondary | ICD-10-CM | POA: Diagnosis not present

## 2015-12-14 DIAGNOSIS — C61 Malignant neoplasm of prostate: Secondary | ICD-10-CM

## 2015-12-14 LAB — CBC WITH DIFFERENTIAL/PLATELET
BASOS ABS: 0 10*3/uL (ref 0.0–0.1)
Basophils Relative: 0 %
EOS ABS: 0 10*3/uL (ref 0.0–0.7)
EOS PCT: 0 %
HCT: 32.2 % — ABNORMAL LOW (ref 39.0–52.0)
Hemoglobin: 11.1 g/dL — ABNORMAL LOW (ref 13.0–17.0)
LYMPHS ABS: 0.7 10*3/uL (ref 0.7–4.0)
Lymphocytes Relative: 7 %
MCH: 31.4 pg (ref 26.0–34.0)
MCHC: 34.5 g/dL (ref 30.0–36.0)
MCV: 91.2 fL (ref 78.0–100.0)
MONO ABS: 0.3 10*3/uL (ref 0.1–1.0)
Monocytes Relative: 3 %
Neutro Abs: 8.4 10*3/uL — ABNORMAL HIGH (ref 1.7–7.7)
Neutrophils Relative %: 90 %
PLATELETS: 220 10*3/uL (ref 150–400)
RBC: 3.53 MIL/uL — AB (ref 4.22–5.81)
RDW: 13.9 % (ref 11.5–15.5)
WBC: 9.3 10*3/uL (ref 4.0–10.5)

## 2015-12-14 LAB — COMPREHENSIVE METABOLIC PANEL
ALT: 20 U/L (ref 17–63)
AST: 25 U/L (ref 15–41)
Albumin: 4 g/dL (ref 3.5–5.0)
Alkaline Phosphatase: 161 U/L — ABNORMAL HIGH (ref 38–126)
Anion gap: 10 (ref 5–15)
BUN: 17 mg/dL (ref 6–20)
CHLORIDE: 103 mmol/L (ref 101–111)
CO2: 24 mmol/L (ref 22–32)
CREATININE: 0.97 mg/dL (ref 0.61–1.24)
Calcium: 9.7 mg/dL (ref 8.9–10.3)
GFR calc non Af Amer: >60 mL/min (ref >60)
Glucose, Bld: 200 mg/dL — ABNORMAL HIGH (ref 65–99)
POTASSIUM: 3.9 mmol/L (ref 3.5–5.1)
SODIUM: 137 mmol/L (ref 135–145)
Total Bilirubin: 0.6 mg/dL (ref 0.3–1.2)
Total Protein: 6.5 g/dL (ref 6.5–8.1)

## 2015-12-14 LAB — PSA: PSA: 69.82 ng/mL — AB (ref 0.00–4.00)

## 2015-12-14 MED ORDER — OXYCODONE-ACETAMINOPHEN 10-325 MG PO TABS: 1.0000 | ORAL_TABLET | Freq: Four times a day (QID) | ORAL | 0 refills | Status: DC | PRN

## 2015-12-14 MED ORDER — PEGFILGRASTIM 6 MG/0.6ML ~~LOC~~ PSKT
6.0000 mg | PREFILLED_SYRINGE | Freq: Once | SUBCUTANEOUS | Status: AC
  Administered 2015-12-14: 6 mg via SUBCUTANEOUS
  Filled 2015-12-14: qty 0.6

## 2015-12-14 MED ORDER — LEUPROLIDE ACETATE 7.5 MG IM KIT
7.5000 mg | PACK | INTRAMUSCULAR | Status: DC
  Administered 2015-12-14: 7.5 mg via INTRAMUSCULAR
  Filled 2015-12-14: qty 7.5

## 2015-12-14 MED ORDER — SODIUM CHLORIDE 0.9% FLUSH
10.0000 mL | INTRAVENOUS | Status: DC | PRN
  Administered 2015-12-14: 10 mL
  Filled 2015-12-14: qty 10

## 2015-12-14 MED ORDER — HEPARIN SOD (PORK) LOCK FLUSH 100 UNIT/ML IV SOLN
250.0000 [IU] | Freq: Once | INTRAVENOUS | Status: AC | PRN
  Administered 2015-12-14: 250 [IU]
  Filled 2015-12-14: qty 5

## 2015-12-14 MED ORDER — SODIUM CHLORIDE 0.9 % IV SOLN
Freq: Once | INTRAVENOUS | Status: AC
  Administered 2015-12-14: 10:00:00 via INTRAVENOUS

## 2015-12-14 MED ORDER — DOCETAXEL CHEMO INJECTION 160 MG/16ML
64.0000 mg/m2 | Freq: Once | INTRAVENOUS | Status: AC
  Administered 2015-12-14: 150 mg via INTRAVENOUS
  Filled 2015-12-14: qty 15

## 2015-12-14 MED ORDER — SODIUM CHLORIDE 0.9 % IV SOLN
10.0000 mg | Freq: Once | INTRAVENOUS | Status: AC
  Administered 2015-12-14: 10 mg via INTRAVENOUS
  Filled 2015-12-14: qty 1

## 2015-12-14 NOTE — Patient Instructions (Signed)
[facility] Discharge Instructions for Patients Receiving Chemotherapy   Beginning January 23rd 2017 lab work for the Cancer Center will be done in the  Main lab at [facility] on 1st floor. If you have a lab appointment with the Cancer Center please come in thru the  Main Entrance and check in at the main information desk   Today you received the following chemotherapy agents Taxotere  To help prevent nausea and vomiting after your treatment, we encourage you to take your nausea medication    If you develop nausea and vomiting, or diarrhea that is not controlled by your medication, call the clinic.  The clinic phone number is [phone]. Office hours are Monday-Friday 8:30am-5:00pm.  BELOW ARE SYMPTOMS THAT SHOULD BE REPORTED IMMEDIATELY:  *FEVER GREATER THAN 101.0 F  *CHILLS WITH OR WITHOUT FEVER  NAUSEA AND VOMITING THAT IS NOT CONTROLLED WITH YOUR NAUSEA MEDICATION  *UNUSUAL SHORTNESS OF BREATH  *UNUSUAL BRUISING OR BLEEDING  TENDERNESS IN MOUTH AND THROAT WITH OR WITHOUT PRESENCE OF ULCERS  *URINARY PROBLEMS  *BOWEL PROBLEMS  UNUSUAL RASH Items with * indicate a potential emergency and should be followed up as soon as possible. If you have an emergency after office hours please contact your primary care physician or go to the nearest emergency department.  Please call the clinic during office hours if you have any questions or concerns.   You may also contact the Patient Navigator at [phone] should you have any questions or need assistance in obtaining follow up care.      Resources For Cancer Patients and their Caregivers ? American Cancer Society: Can assist with transportation, wigs, general needs, runs Look Good Feel Better.        1-888-227-6333 ? Cancer Care: Provides financial assistance, online support groups, medication/co-pay assistance.  1-800-813-HOPE (4673) ? [facility] Assists [place]  cancer patients and their families through emotional , educational and financial support.  336-427-4357 ? [place] DSS Where to apply for food stamps, Medicaid and utility assistance. 336-342-1394 ? RCATS: Transportation to medical appointments. 336-347-2287 ? Social Security Administration: May apply for disability if have a Stage IV cancer. 336-342-7796 1-800-772-1213 ? [place] Aging, Disability and Transit Services: Assists with nutrition, care and transit needs. [phone]          

## 2015-12-14 NOTE — Progress Notes (Unsigned)
Referral to Dr Arnoldo Morale for port.  Appt 10/24 @1000 . Records faxed on 10/13

## 2015-12-14 NOTE — Progress Notes (Signed)
Micheal Davis presents today for injection per MD orders. Lupron 7.5mg  administered IM in left upper buttocks Administration without incident. Patient tolerated well.  Chemotherapy given to patient per orders. Patient tolerated well. Vitals stable and discharged from clinic with wife, ambulatory.  Follow up as scheduled

## 2015-12-14 NOTE — Patient Instructions (Addendum)
[place] at [facility] Discharge Instructions  RECOMMENDATIONS MADE BY THE CONSULTANT AND ANY TEST RESULTS WILL BE SENT TO YOUR REFERRING PHYSICIAN.  You saw Kirby Crigler, PA-C, today. Pain medication refilled. Treatment today.  follow up in 3 weeks with MD, same day as treatment.  Thank you for choosing [place] at [facility] to provide your oncology and hematology care.  To afford each patient quality time with our provider, please arrive at least 15 minutes before your scheduled appointment time.   Beginning January 23rd 2017 lab work for the [facility] will be done in the  Main lab at [facility] on 1st floor. If you have a lab appointment with the [facility] please come in thru the  Main Entrance and check in at the main information desk  You need to re-schedule your appointment should you arrive 10 or more minutes late.  We strive to give you quality time with our providers, and arriving late affects you and other patients whose appointments are after yours.  Also, if you no show three or more times for appointments you may be dismissed from the clinic at the providers discretion.     Again, thank you for choosing [facility].  Our hope is that these requests will decrease the amount of time that you wait before being seen by our physicians.       _____________________________________________________________  Should you have questions after your visit to [facility], please contact our office at [phone] between the hours of 8:30 a.m. and 4:30 p.m.  Voicemails left after 4:30 p.m. will not be returned until the following business day.  For prescription refill requests, have your pharmacy contact our office.         Resources For Cancer Patients and their Caregivers ? American Cancer Society: Can assist with transportation, wigs, general needs, runs Look Good Feel Better.         ([phone] ? Cancer Care: Provides financial assistance, online support groups, medication/co-pay assistance.  1-800-813-HOPE (303) 762-9179) ? [facility] Assists [place] cancer patients and their families through emotional , educational and financial support.  267 445 7575 ? [place] DSS Where to apply for food stamps, Medicaid and utility assistance. 540-330-7810 ? RCATS: Transportation to medical appointments. 520-533-9017 ? Social Security Administration: May apply for disability if have a Stage IV cancer. 870-087-1117 202-724-1412 ? [employer], Disability and Transit Services: Assists with nutrition, care and transit needs. [place] Support Programs: @10RELATIVEDAYS @ > Cancer Support Group  2nd Tuesday of the month 1pm-2pm, Journey Room  > Creative Journey  3rd Tuesday of the month 1130am-1pm, Journey Room  > Look Good Feel Better  1st Wednesday of the month 10am-12 noon, Journey Room (Call [place] to register [phone])

## 2015-12-18 ENCOUNTER — Encounter (HOSPITAL_BASED_OUTPATIENT_CLINIC_OR_DEPARTMENT_OTHER): Payer: Medicare Other

## 2015-12-18 VITALS — BP 145/67 | HR 71 | Temp 97.9°F | Resp 18

## 2015-12-18 DIAGNOSIS — Z452 Encounter for adjustment and management of vascular access device: Secondary | ICD-10-CM | POA: Diagnosis not present

## 2015-12-18 DIAGNOSIS — C61 Malignant neoplasm of prostate: Secondary | ICD-10-CM | POA: Diagnosis not present

## 2015-12-18 DIAGNOSIS — C7951 Secondary malignant neoplasm of bone: Secondary | ICD-10-CM | POA: Diagnosis not present

## 2015-12-18 MED ORDER — HEPARIN SOD (PORK) LOCK FLUSH 100 UNIT/ML IV SOLN
INTRAVENOUS | Status: AC
  Filled 2015-12-18: qty 5

## 2015-12-18 MED ORDER — SODIUM CHLORIDE 0.9% FLUSH
3.0000 mL | INTRAVENOUS | Status: DC | PRN
  Administered 2015-12-18: 3 mL via INTRAVENOUS
  Filled 2015-12-18: qty 10

## 2015-12-18 MED ORDER — HEPARIN SOD (PORK) LOCK FLUSH 100 UNIT/ML IV SOLN
500.0000 [IU] | Freq: Once | INTRAVENOUS | Status: AC
  Administered 2015-12-18: 300 [IU] via INTRAVENOUS

## 2015-12-18 NOTE — Patient Instructions (Signed)
[place] at [facility] Discharge Instructions  RECOMMENDATIONS MADE BY THE CONSULTANT AND ANY TEST RESULTS WILL BE SENT TO YOUR REFERRING PHYSICIAN.  PICC line flushed with dressing and saline lock changed per protocol. Follow-up as scheduled. Call clinic for any questions or concerns  Thank you for choosing [place] at [facility] to provide your oncology and hematology care.  To afford each patient quality time with our provider, please arrive at least 15 minutes before your scheduled appointment time.   Beginning January 23rd 2017 lab work for the [facility] will be done in the  Main lab at [facility] on 1st floor. If you have a lab appointment with the [facility] please come in thru the  Main Entrance and check in at the main information desk  You need to re-schedule your appointment should you arrive 10 or more minutes late.  We strive to give you quality time with our providers, and arriving late affects you and other patients whose appointments are after yours.  Also, if you no show three or more times for appointments you may be dismissed from the clinic at the providers discretion.     Again, thank you for choosing [facility].  Our hope is that these requests will decrease the amount of time that you wait before being seen by our physicians.       _____________________________________________________________  Should you have questions after your visit to [facility], please contact our office at [phone] between the hours of 8:30 a.m. and 4:30 p.m.  Voicemails left after 4:30 p.m. will not be returned until the following business day.  For prescription refill requests, have your pharmacy contact our office.         Resources For Cancer Patients and their Caregivers ? American Cancer Society: Can assist with transportation, wigs, general needs, runs Look Good Feel Better.         (424) 125-5438 ? Cancer Care: Provides financial assistance, online support groups, medication/co-pay assistance.  1-800-813-HOPE 272-825-4383) ? [place] Assists [place] cancer patients and their families through emotional , educational and financial support.  802-299-0652 ? [place] DSS Where to apply for food stamps, Medicaid and utility assistance. 949-770-6965 ? RCATS: Transportation to medical appointments. 229-582-1629 ? Social Security Administration: May apply for disability if have a Stage IV cancer. 906-134-3750 (304) 479-6037 ? [employer], Disability and Transit Services: Assists with nutrition, care and transit needs. [place] Support Programs: @10RELATIVEDAYS @ > Cancer Support Group  2nd Tuesday of the month 1pm-2pm, Journey Room  > Creative Journey  3rd Tuesday of the month 1130am-1pm, Journey Room  > Look Good Feel Better  1st Wednesday of the month 10am-12 noon, Journey Room (Call [place] to register [phone])

## 2015-12-18 NOTE — Progress Notes (Signed)
Micheal Davis tolerated PICC line flush and dressing change well without complaints or incident. PICC line flushed with 3 ml NS and 3 ml Heparin per protocol with blood return noted. Dressing and saline lock changed per protocol as well. Pt discharged self ambulatory in satisfactory condition

## 2015-12-21 ENCOUNTER — Encounter (HOSPITAL_COMMUNITY): Payer: Medicare Other | Attending: Hematology & Oncology

## 2015-12-21 VITALS — BP 148/70 | HR 68 | Temp 98.2°F | Resp 20

## 2015-12-21 DIAGNOSIS — C61 Malignant neoplasm of prostate: Secondary | ICD-10-CM

## 2015-12-21 DIAGNOSIS — Z452 Encounter for adjustment and management of vascular access device: Secondary | ICD-10-CM | POA: Diagnosis not present

## 2015-12-21 DIAGNOSIS — C7951 Secondary malignant neoplasm of bone: Secondary | ICD-10-CM

## 2015-12-21 MED ORDER — HEPARIN SOD (PORK) LOCK FLUSH 100 UNIT/ML IV SOLN
INTRAVENOUS | Status: AC
  Filled 2015-12-21: qty 5

## 2015-12-21 MED ORDER — SODIUM CHLORIDE 0.9% FLUSH
3.0000 mL | INTRAVENOUS | Status: DC | PRN
  Administered 2015-12-21: 3 mL via INTRAVENOUS
  Filled 2015-12-21: qty 10

## 2015-12-21 MED ORDER — HEPARIN SOD (PORK) LOCK FLUSH 100 UNIT/ML IV SOLN
500.0000 [IU] | Freq: Once | INTRAVENOUS | Status: AC
  Administered 2015-12-21: 300 [IU] via INTRAVENOUS

## 2015-12-21 NOTE — Patient Instructions (Signed)
[place] at [facility] Discharge Instructions  RECOMMENDATIONS MADE BY THE CONSULTANT AND ANY TEST RESULTS WILL BE SENT TO YOUR REFERRING PHYSICIAN.  PICC line flushed per protocol today. Follow-up as scheduled. Call clinic for any questions or concerns  Thank you for choosing [place] at [facility] to provide your oncology and hematology care.  To afford each patient quality time with our provider, please arrive at least 15 minutes before your scheduled appointment time.   Beginning January 23rd 2017 lab work for the [facility] will be done in the  Main lab at [facility] on 1st floor. If you have a lab appointment with the [facility] please come in thru the  Main Entrance and check in at the main information desk  You need to re-schedule your appointment should you arrive 10 or more minutes late.  We strive to give you quality time with our providers, and arriving late affects you and other patients whose appointments are after yours.  Also, if you no show three or more times for appointments you may be dismissed from the clinic at the providers discretion.     Again, thank you for choosing [facility].  Our hope is that these requests will decrease the amount of time that you wait before being seen by our physicians.       _____________________________________________________________  Should you have questions after your visit to [facility], please contact our office at [phone] between the hours of 8:30 a.m. and 4:30 p.m.  Voicemails left after 4:30 p.m. will not be returned until the following business day.  For prescription refill requests, have your pharmacy contact our office.         Resources For Cancer Patients and their Caregivers ? American Cancer Society: Can assist with transportation, wigs, general needs, runs Look Good Feel Better.        602-846-1646 ? Cancer Care: Provides  financial assistance, online support groups, medication/co-pay assistance.  1-800-813-HOPE 667 562 8912) ? [place] Assists [place] cancer patients and their families through emotional , educational and financial support.  949-241-3642 ? [place] DSS Where to apply for food stamps, Medicaid and utility assistance. (772) 739-5558 ? RCATS: Transportation to medical appointments. 916-189-9790 ? Social Security Administration: May apply for disability if have a Stage IV cancer. (347)315-6090 939-318-6619 ? [employer], Disability and Transit Services: Assists with nutrition, care and transit needs. [place] Support Programs: @10RELATIVEDAYS @ > Cancer Support Group  2nd Tuesday of the month 1pm-2pm, Journey Room  > Creative Journey  3rd Tuesday of the month 1130am-1pm, Journey Room  > Look Good Feel Better  1st Wednesday of the month 10am-12 noon, Journey Room (Call [place] to register [phone])

## 2015-12-21 NOTE — Progress Notes (Signed)
Micheal Davis tolerated PICC line flush well without complaints or incident. PICC line site WNL and flushed with 3 ml NS and 3 ml Heparin easily with good blood return per protocol. Pt complained of fatigue. VSS Pt discharged self ambulatory in satisfactory condition with wife

## 2015-12-25 ENCOUNTER — Encounter (HOSPITAL_BASED_OUTPATIENT_CLINIC_OR_DEPARTMENT_OTHER): Payer: Medicare Other

## 2015-12-25 ENCOUNTER — Encounter (HOSPITAL_COMMUNITY): Payer: Self-pay

## 2015-12-25 DIAGNOSIS — C61 Malignant neoplasm of prostate: Secondary | ICD-10-CM

## 2015-12-25 DIAGNOSIS — Z452 Encounter for adjustment and management of vascular access device: Secondary | ICD-10-CM | POA: Diagnosis not present

## 2015-12-25 DIAGNOSIS — C7951 Secondary malignant neoplasm of bone: Secondary | ICD-10-CM

## 2015-12-25 MED ORDER — HEPARIN SOD (PORK) LOCK FLUSH 100 UNIT/ML IV SOLN
INTRAVENOUS | Status: AC
  Filled 2015-12-25: qty 5

## 2015-12-25 MED ORDER — HEPARIN SOD (PORK) LOCK FLUSH 100 UNIT/ML IV SOLN
250.0000 [IU] | Freq: Once | INTRAVENOUS | Status: AC
  Administered 2015-12-25: 250 [IU] via INTRAVENOUS

## 2015-12-25 MED ORDER — SODIUM CHLORIDE 0.9% FLUSH
10.0000 mL | INTRAVENOUS | Status: DC | PRN
  Administered 2015-12-25: 10 mL via INTRAVENOUS
  Filled 2015-12-25: qty 10

## 2015-12-25 NOTE — Patient Instructions (Signed)
[place] at [facility] Discharge Instructions  RECOMMENDATIONS MADE BY THE CONSULTANT AND ANY TEST RESULTS WILL BE SENT TO YOUR REFERRING PHYSICIAN.  You had your PICC flushed and dressing changed today.  Return as scheduled.   Thank you for choosing [place] at [facility] to provide your oncology and hematology care.  To afford each patient quality time with our provider, please arrive at least 15 minutes before your scheduled appointment time.   Beginning January 23rd 2017 lab work for the [facility] will be done in the  Main lab at [facility] on 1st floor. If you have a lab appointment with the [facility] please come in thru the  Main Entrance and check in at the main information desk  You need to re-schedule your appointment should you arrive 10 or more minutes late.  We strive to give you quality time with our providers, and arriving late affects you and other patients whose appointments are after yours.  Also, if you no show three or more times for appointments you may be dismissed from the clinic at the providers discretion.     Again, thank you for choosing [facility].  Our hope is that these requests will decrease the amount of time that you wait before being seen by our physicians.       _____________________________________________________________  Should you have questions after your visit to [facility], please contact our office at [phone] between the hours of 8:30 a.m. and 4:30 p.m.  Voicemails left after 4:30 p.m. will not be returned until the following business day.  For prescription refill requests, have your pharmacy contact our office.         Resources For Cancer Patients and their Caregivers ? American Cancer Society: Can assist with transportation, wigs, general needs, runs Look Good Feel Better.        9011165564 ? Cancer Care: Provides financial assistance, online  support groups, medication/co-pay assistance.  1-800-813-HOPE 785-227-1637) ? [place] Assists [place] cancer patients and their families through emotional , educational and financial support.  (615)709-9985 ? [place] DSS Where to apply for food stamps, Medicaid and utility assistance. (806) 812-4598 ? RCATS: Transportation to medical appointments. 445-044-6372 ? Social Security Administration: May apply for disability if have a Stage IV cancer. 340-726-0227 856-748-4350 ? [employer], Disability and Transit Services: Assists with nutrition, care and transit needs. [place] Support Programs: @10RELATIVEDAYS @ > Cancer Support Group  2nd Tuesday of the month 1pm-2pm, Journey Room  > Creative Journey  3rd Tuesday of the month 1130am-1pm, Journey Room  > Look Good Feel Better  1st Wednesday of the month 10am-12 noon, Journey Room (Call [place] to register [phone])

## 2015-12-25 NOTE — Progress Notes (Signed)
Micheal Davis presented for PICC line flush. PICC line located in right arm  . Good blood return present. PICC line flushed with 57ml NS and 300U/50ml Heparin. Procedure without incident.  Patient tolerated procedure well. Dressing changed. Pt stable and discharged home ambulatory with wife.

## 2015-12-25 NOTE — H&P (Signed)
NTS SOAP Note  Vital Signs:  Vitals as of: [id number]: Systolic A999333: Diastolic 94: Heart Rate 69: Temp 97.62F (Temporal): Height 9ft 10in: Weight 259Lbs 0 Ounces: BMI 37.16   BMI : 37.16 kg/m2  Subjective: This 70 year old male presents for of need for portacath.  Referred by Oncology. 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  Review of Symptoms:  Constitutional:negative Head:negative Eyes:negative sinus problems Cardiovascular:negative Respiratory:negative Gastrointestinnegative Genitourinary:frequency joint and back pain Skin:negative Hematolgic/Lymphatic:negative Allergic/Immunologic:negative   Past Medical History:Reviewed  Past Medical History  Surgical History: knee replacement, prostate biopsy Medical Problems: prostate cancer with mets to bone, DM, HTN, sleep apnea, thyroid disease Allergies: nkda Medications: baby asa, flexeril, decadron, cardizem, percocet, glucophage, synthroid, zofran, tenormin, trental, ultram, dyazide   Social History:Reviewed  Social History  Preferred Language: English Race:  White Ethnicity: Not Hispanic / Latino Age: 44 year Marital Status:  M Alcohol: no   Smoking Status: Never smoker reviewed on 12/25/2015 Functional Status reviewed on 12/25/2015 ------------------------------------------------ Bathing: Normal Cooking: Normal Dressing: Normal Driving: Normal Eating: Normal Managing Meds: Normal Oral Care: Normal Shopping: Normal Toileting: Normal Transferring: Normal Walking: Normal Cognitive Status reviewed on 12/25/2015 ------------------------------------------------ Attention: Normal Decision Making: Normal Language: Normal Memory: Normal Motor: Normal Perception: Normal Problem Solving: Normal Visual and Spatial: Normal   Family History:Reviewed  Family Health History Family History is Unknown    Objective Information: General:Well appearing, well nourished in no  distress. Head:Atraumatic; no masses; no abnormalities Neck:Supple without lymphadenopathy.  Heart:RRR, no murmur or gallop.  Normal S1, S2.  No S3, S4.  Lungs:CTA bilaterally, no wheezes, rhonchi, rales.  Breathing unlabored. Dr. Donald Pore notes reviewed. Assessment:Metastatic prostate cancer to bone  Diagnoses: 70  C61 Metastasis from malignant tumor of prostate (Malignant neoplasm of prostate)  Procedures: [id number] - OFFICE OUTPATIENT NEW 20 MINUTES    Plan:  Scheduled for portacath insertion on 12/31/15.   Patient Education:Alternative treatments to surgery were discussed with patient (and family).Risks and benefits  of procedure including bleeding, infection, and pneumothorax were fully explained to the patient (and family) who gave informed consent. Patient/family questions were addressed.  Follow-up:Pending Surgery

## 2015-12-26 NOTE — Patient Instructions (Signed)
Micheal Davis  12/26/2015     @PREFPERIOPPHARMACY @   Your procedure is scheduled on  12/31/2015  Report to [facility] at  720  A.M.  Call this number if you have problems the morning of surgery:  [phone]   Remember:  Do not eat food or drink liquids after midnight.  Take these medicines the morning of surgery with A SIP OF WATER  Atenolol, diltiazem, synthroid, zofran or compazine, oxycodone.   Do not wear jewelry, make-up or nail polish.  Do not wear lotions, powders, or perfumes, or deoderant.  Do not shave 48 hours prior to surgery.  Men may shave face and neck.  Do not bring valuables to the hospital.  [facility] is not responsible for any belongings or valuables.  Contacts, dentures or bridgework may not be worn into surgery.  Leave your suitcase in the car.  After surgery it may be brought to your room.  For patients admitted to the hospital, discharge time will be determined by your treatment team.  Patients discharged the day of surgery will not be allowed to drive home.   Name and phone number of your driver:   family Special instructions:  none  Please read over the following fact sheets that you were given. Anesthesia Post-op Instructions and Care and Recovery After Surgery       Implanted [facility] Guide An implanted port is a type of central line that is placed under the skin. Central lines are used to provide IV access when treatment or nutrition needs to be given through a person's veins. Implanted ports are used for long-term IV access. An implanted port may be placed because:   You need IV medicine that would be irritating to the small veins in your hands or arms.   You need long-term IV medicines, such as antibiotics.   You need IV nutrition for a long period.   You need frequent blood draws for lab tests.   You need dialysis.  Implanted ports are usually placed in the chest area, but they can also be placed in the  upper arm, the abdomen, or the leg. An implanted port has two main parts:   Reservoir. The reservoir is round and will appear as a small, raised area under your skin. The reservoir is the part where a needle is inserted to give medicines or draw blood.   Catheter. The catheter is a thin, flexible tube that extends from the reservoir. The catheter is placed into a large vein. Medicine that is inserted into the reservoir goes into the catheter and then into the vein.  HOW WILL I CARE FOR MY INCISION SITE? Do not get the incision site wet. Bathe or shower as directed by your health care provider.  HOW IS MY PORT ACCESSED? Special steps must be taken to access the port:   Before the port is accessed, a numbing cream can be placed on the skin. This helps numb the skin over the port site.   Your health care provider uses a sterile technique to access the port.  Your health care provider must put on a mask and sterile gloves.  The skin over your port is cleaned carefully with an antiseptic and allowed to dry.  The port is gently pinched between sterile gloves, and a needle is inserted into the port.  Only "non-coring" port needles should be used to access the port. Once the port is  accessed, a blood return should be checked. This helps ensure that the port is in the vein and is not clogged.   If your port needs to remain accessed for a constant infusion, a clear (transparent) bandage will be placed over the needle site. The bandage and needle will need to be changed every week, or as directed by your health care provider.   Keep the bandage covering the needle clean and dry. Do not get it wet. Follow your health care provider's instructions on how to take a shower or bath while the port is accessed.   If your port does not need to stay accessed, no bandage is needed over the port.  WHAT IS FLUSHING? Flushing helps keep the port from getting clogged. Follow your health care provider's  instructions on how and when to flush the port. Ports are usually flushed with saline solution or a medicine called heparin. The need for flushing will depend on how the port is used.   If the port is used for intermittent medicines or blood draws, the port will need to be flushed:   After medicines have been given.   After blood has been drawn.   As part of routine maintenance.   If a constant infusion is running, the port may not need to be flushed.  HOW LONG WILL MY PORT STAY IMPLANTED? The port can stay in for as long as your health care provider thinks it is needed. When it is time for the port to come out, surgery will be done to remove it. The procedure is similar to the one performed when the port was put in.  WHEN SHOULD I SEEK IMMEDIATE MEDICAL CARE? When you have an implanted port, you should seek immediate medical care if:   You notice a bad smell coming from the incision site.   You have swelling, redness, or drainage at the incision site.   You have more swelling or pain at the port site or the surrounding area.   You have a fever that is not controlled with medicine.   This information is not intended to replace advice given to you by your health care provider. Make sure you discuss any questions you have with your health care provider.   Document Released: 02/17/2005 Document Revised: 12/08/2012 Document Reviewed: 10/25/2012 Elsevier Interactive Patient Education 2016 [place] Insertion An implanted port is a central line that has a round shape and is placed under the skin. It is used as a long-term IV access for:   Medicines, such as chemotherapy.   Fluids.   Liquid nutrition, such as total parenteral nutrition (TPN).   Blood samples.  LET [facility] CARE PROVIDER KNOW ABOUT:  Allergies to food or medicine.   Medicines taken, including vitamins, herbs, eye drops, creams, and over-the-counter medicines.   Any allergies  to heparin.  Use of steroids (by mouth or creams).   Previous problems with anesthetics or numbing medicines.   History of bleeding problems or blood clots.   Previous surgery.   Other health problems, including diabetes and kidney problems.   Possibility of pregnancy, if this applies. RISKS AND COMPLICATIONS Generally, this is a safe procedure. However, as with any procedure, problems can occur. Possible problems include:  Damage to the blood vessel, bruising, or bleeding at the puncture site.   Infection.  Blood clot in the vessel that the port is in.  Breakdown of the skin over your port.  Very rarely a person may  develop a condition called a pneumothorax, a collection of air in the chest that may cause one of the lungs to collapse. The placement of these catheters with the appropriate imaging guidance significantly decreases the risk of a pneumothorax.  BEFORE THE PROCEDURE   Your health care provider may want you to have blood tests. These tests can help tell how well your kidneys and liver are working. They can also show how well your blood clots.   If you take blood thinners (anticoagulant medicines), ask your health care provider when you should stop taking them.   Make arrangements for someone to drive you home. This is necessary if you have been sedated for your procedure.  PROCEDURE  Port insertion usually takes about 30-45 minutes.   An IV needle will be inserted in your arm. Medicine for pain and medicine to help relax you (sedative) will flow directly into your body through this needle.   You will lie on an exam table, and you will be connected to monitors to keep track of your heart rate, blood pressure, and breathing throughout the procedure.  An oxygen monitoring device may be attached to your finger. Oxygen will be given.   Everything will be kept as germ free (sterile) as possible during the procedure. The skin near the point of the incision  will be cleansed with antiseptic, and the area will be draped with sterile towels. The skin and deeper tissues over the port area will be made numb with a local anesthetic.  Two small cuts (incisions) will be made in the skin to insert the port. One will be made in the neck to obtain access to the vein where the catheter will lie.   Because the port reservoir will be placed under the skin, a small skin incision will be made in the upper chest, and a small pocket for the port will be made under the skin. The catheter that will be connected to the port tunnels to a large central vein in the chest. A small, raised area will remain on your body at the site of the reservoir when the procedure is complete.  The port placement will be done under imaging guidance to ensure the proper placement.  The reservoir has a silicone covering that can be punctured with a special needle.   The port will be flushed with normal saline, and blood will be drawn to make sure it is working properly.  There will be nothing remaining outside the skin when the procedure is finished.   Incisions will be held together by stitches, surgical glue, or a special tape. AFTER THE PROCEDURE  You will stay in a recovery area until the anesthesia has worn off. Your blood pressure and pulse will be checked.  A final chest X-ray will be taken to check the placement of the port and to ensure that there is no injury to your lung.   This information is not intended to replace advice given to you by your health care provider. Make sure you discuss any questions you have with your health care provider.   Document Released: 12/08/2012 Document Revised: 03/10/2014 Document Reviewed: 12/08/2012 Elsevier Interactive Patient Education 2016 [place] Insertion, Care After Refer to this sheet in the next few weeks. These instructions provide you with information on caring for yourself after your procedure. Your health  care provider may also give you more specific instructions. Your treatment has been planned according to current medical practices, but problems sometimes occur.  Call your health care provider if you have any problems or questions after your procedure. WHAT TO EXPECT AFTER THE PROCEDURE After your procedure, it is typical to have the following:   Discomfort at the port insertion site. Ice packs to the area will help.  Bruising on the skin over the port. This will subside in 3-4 days. HOME CARE INSTRUCTIONS  After your port is placed, you will get a manufacturer's information card. The card has information about your port. Keep this card with you at all times.   Know what kind of port you have. There are many types of ports available.   Wear a medical alert bracelet in case of an emergency. This can help alert health care workers that you have a port.   The port can stay in for as long as your health care provider believes it is necessary.   A home health care nurse may give medicines and take care of the port.   You or a family member can get special training and directions for giving medicine and taking care of the port at home.  SEEK MEDICAL CARE IF:   Your port does not flush or you are unable to get a blood return.   You have a fever or chills. SEEK IMMEDIATE MEDICAL CARE IF:  You have new fluid or pus coming from your incision.   You notice a bad smell coming from your incision site.   You have swelling, pain, or more redness at the incision or port site.   You have chest pain or shortness of breath.   This information is not intended to replace advice given to you by your health care provider. Make sure you discuss any questions you have with your health care provider.   Document Released: 12/08/2012 Document Revised: 02/22/2013 Document Reviewed: 12/08/2012 Elsevier Interactive Patient Education 2016 [place] Monitored  anesthesia care is an anesthesia service for a medical procedure. Anesthesia is the loss of the ability to feel pain. It is produced by medicines called anesthetics. It may affect a small area of your body (local anesthesia), a large area of your body (regional anesthesia), or your entire body (general anesthesia). The need for monitored anesthesia care depends your procedure, your condition, and the potential need for regional or general anesthesia. It is often provided during procedures where:   General anesthesia may be needed if there are complications. This is because you need special care when you are under general anesthesia.   You will be under local or regional anesthesia. This is so that you are able to have higher levels of anesthesia if needed.   You will receive calming medicines (sedatives). This is especially the case if sedatives are given to put you in a semi-conscious state of relaxation (deep sedation). This is because the amount of sedative needed to produce this state can be hard to predict. Too much of a sedative can produce general anesthesia. Monitored anesthesia care is performed by one or more health care providers who have special training in all types of anesthesia. You will need to meet with these health care providers before your procedure. During this meeting, they will ask you about your medical history. They will also give you instructions to follow. (For example, you will need to stop eating and drinking before your procedure. You may also need to stop or change medicines you are taking.) During your procedure, your health care providers will stay with you. They will:  Watch your condition. This includes watching your blood pressure, breathing, and level of pain.   Diagnose and treat problems that occur.   Give medicines if they are needed. These may include calming medicines (sedatives) and anesthetics.   Make sure you are comfortable.  Having monitored  anesthesia care does not necessarily mean that you will be under anesthesia. It does mean that your health care providers will be able to manage anesthesia if you need it or if it occurs. It also means that you will be able to have a different type of anesthesia than you are having if you need it. When your procedure is complete, your health care providers will continue to watch your condition. They will make sure any medicines wear off before you are allowed to go home.    This information is not intended to replace advice given to you by your health care provider. Make sure you discuss any questions you have with your health care provider.   Document Released: 11/13/2004 Document Revised: 03/10/2014 Document Reviewed: 03/31/2012 Elsevier Interactive Patient Education 2016 Elsevier Inc[employer] PATIENT INSTRUCTIONS POST-ANESTHESIA  IMMEDIATELY FOLLOWING SURGERY:  Do not drive or operate machinery for the first twenty four hours after surgery.  Do not make any important decisions for twenty four hours after surgery or while taking narcotic pain medications or sedatives.  If you develop intractable nausea and vomiting or a severe headache please notify your doctor immediately.  FOLLOW-UP:  Please make an appointment with your surgeon as instructed. You do not need to follow up with anesthesia unless specifically instructed to do so.  WOUND CARE INSTRUCTIONS (if applicable):  Keep a dry clean dressing on the anesthesia/puncture wound site if there is drainage.  Once the wound has quit draining you may leave it open to air.  Generally you should leave the bandage intact for twenty four hours unless there is drainage.  If the epidural site drains for more than 36-48 hours please call the anesthesia department.  QUESTIONS?:  Please feel free to call your physician or the hospital operator if you have any questions, and they will be happy to assist you.

## 2015-12-27 ENCOUNTER — Encounter (HOSPITAL_COMMUNITY)
Admission: RE | Admit: 2015-12-27 | Discharge: 2015-12-27 | Disposition: A | Discharge status: Home / Self Care | Payer: Medicare Other | Source: Ambulatory Visit | Attending: General Surgery | Admitting: General Surgery

## 2015-12-27 ENCOUNTER — Encounter (HOSPITAL_COMMUNITY): Payer: Self-pay

## 2015-12-27 DIAGNOSIS — Z01810 Encounter for preprocedural cardiovascular examination: Secondary | ICD-10-CM | POA: Insufficient documentation

## 2015-12-27 DIAGNOSIS — E119 Type 2 diabetes mellitus without complications: Secondary | ICD-10-CM | POA: Diagnosis not present

## 2015-12-27 DIAGNOSIS — C61 Malignant neoplasm of prostate: Secondary | ICD-10-CM | POA: Insufficient documentation

## 2015-12-27 DIAGNOSIS — C7951 Secondary malignant neoplasm of bone: Secondary | ICD-10-CM | POA: Diagnosis not present

## 2015-12-27 DIAGNOSIS — I10 Essential (primary) hypertension: Secondary | ICD-10-CM | POA: Diagnosis not present

## 2015-12-27 DIAGNOSIS — E079 Disorder of thyroid, unspecified: Secondary | ICD-10-CM | POA: Insufficient documentation

## 2015-12-27 DIAGNOSIS — G4730 Sleep apnea, unspecified: Secondary | ICD-10-CM | POA: Diagnosis not present

## 2015-12-28 ENCOUNTER — Encounter (HOSPITAL_BASED_OUTPATIENT_CLINIC_OR_DEPARTMENT_OTHER): Payer: Medicare Other

## 2015-12-28 VITALS — BP 138/76 | HR 66 | Temp 98.3°F | Resp 20

## 2015-12-28 DIAGNOSIS — Z452 Encounter for adjustment and management of vascular access device: Secondary | ICD-10-CM

## 2015-12-28 DIAGNOSIS — C7951 Secondary malignant neoplasm of bone: Secondary | ICD-10-CM

## 2015-12-28 DIAGNOSIS — C61 Malignant neoplasm of prostate: Secondary | ICD-10-CM

## 2015-12-28 MED ORDER — SODIUM CHLORIDE 0.9% FLUSH
10.0000 mL | Freq: Once | INTRAVENOUS | Status: AC
  Administered 2015-12-28: 10 mL via INTRAVENOUS

## 2015-12-28 MED ORDER — HEPARIN SOD (PORK) LOCK FLUSH 100 UNIT/ML IV SOLN
250.0000 [IU] | Freq: Once | INTRAVENOUS | Status: AC
  Administered 2015-12-28: 250 [IU] via INTRAVENOUS
  Filled 2015-12-28: qty 5

## 2015-12-28 NOTE — Progress Notes (Signed)
Micheal Davis presented for PICC line flush. Proper placement of PICC confirmed by CXR. PICC line located right upper arm. . Good blood return present. PICC line flushed with 56ml NS and 300U/24ml Heparin. Procedure without incident. Patient tolerated procedure well.

## 2015-12-31 ENCOUNTER — Ambulatory Visit (HOSPITAL_COMMUNITY): Payer: Medicare Other | Admitting: Anesthesiology

## 2015-12-31 ENCOUNTER — Ambulatory Visit (HOSPITAL_COMMUNITY): Payer: Medicare Other

## 2015-12-31 ENCOUNTER — Ambulatory Visit (HOSPITAL_COMMUNITY)
Admission: RE | Admit: 2015-12-31 | Discharge: 2015-12-31 | Disposition: A | Discharge status: Home / Self Care | Payer: Medicare Other | Source: Ambulatory Visit | Attending: General Surgery | Admitting: General Surgery

## 2015-12-31 ENCOUNTER — Encounter (HOSPITAL_COMMUNITY): Payer: Self-pay | Admitting: *Deleted

## 2015-12-31 ENCOUNTER — Encounter (HOSPITAL_COMMUNITY)
Admission: RE | Disposition: A | Discharge status: Home / Self Care | Payer: Self-pay | Source: Ambulatory Visit | Attending: General Surgery

## 2015-12-31 DIAGNOSIS — Z95828 Presence of other vascular implants and grafts: Secondary | ICD-10-CM

## 2015-12-31 DIAGNOSIS — E079 Disorder of thyroid, unspecified: Secondary | ICD-10-CM | POA: Diagnosis not present

## 2015-12-31 DIAGNOSIS — Z96659 Presence of unspecified artificial knee joint: Secondary | ICD-10-CM | POA: Insufficient documentation

## 2015-12-31 DIAGNOSIS — I10 Essential (primary) hypertension: Secondary | ICD-10-CM | POA: Diagnosis not present

## 2015-12-31 DIAGNOSIS — Z452 Encounter for adjustment and management of vascular access device: Secondary | ICD-10-CM | POA: Diagnosis not present

## 2015-12-31 DIAGNOSIS — C61 Malignant neoplasm of prostate: Secondary | ICD-10-CM | POA: Diagnosis not present

## 2015-12-31 DIAGNOSIS — Z7984 Long term (current) use of oral hypoglycemic drugs: Secondary | ICD-10-CM | POA: Insufficient documentation

## 2015-12-31 DIAGNOSIS — Z79899 Other long term (current) drug therapy: Secondary | ICD-10-CM | POA: Diagnosis not present

## 2015-12-31 DIAGNOSIS — C7951 Secondary malignant neoplasm of bone: Secondary | ICD-10-CM | POA: Diagnosis not present

## 2015-12-31 DIAGNOSIS — Z7982 Long term (current) use of aspirin: Secondary | ICD-10-CM | POA: Diagnosis not present

## 2015-12-31 DIAGNOSIS — E119 Type 2 diabetes mellitus without complications: Secondary | ICD-10-CM | POA: Insufficient documentation

## 2015-12-31 HISTORY — PX: PORTACATH PLACEMENT: SHX2246

## 2015-12-31 LAB — GLUCOSE, CAPILLARY: GLUCOSE-CAPILLARY: 120 mg/dL — AB (ref 65–99)

## 2015-12-31 SURGERY — INSERTION, TUNNELED CENTRAL VENOUS DEVICE, WITH PORT
Anesthesia: Monitor Anesthesia Care | Site: Chest | Laterality: Left

## 2015-12-31 MED ORDER — CHLORHEXIDINE GLUCONATE CLOTH 2 % EX PADS: 6.0000 | MEDICATED_PAD | Freq: Once | CUTANEOUS | Status: DC

## 2015-12-31 MED ORDER — LIDOCAINE HCL (CARDIAC) 10 MG/ML IV SOLN
INTRAVENOUS | Status: DC | PRN
  Administered 2015-12-31: 50 mg via INTRAVENOUS

## 2015-12-31 MED ORDER — PROPOFOL 500 MG/50ML IV EMUL
INTRAVENOUS | Status: DC | PRN
  Administered 2015-12-31: 50 ug/kg/min via INTRAVENOUS

## 2015-12-31 MED ORDER — HYDROMORPHONE HCL 1 MG/ML IJ SOLN: 0.2500 mg | INTRAMUSCULAR | Status: DC | PRN

## 2015-12-31 MED ORDER — PROPOFOL 10 MG/ML IV BOLUS
INTRAVENOUS | Status: AC
  Filled 2015-12-31: qty 20

## 2015-12-31 MED ORDER — SODIUM CHLORIDE 0.9 % IV SOLN
INTRAVENOUS | Status: DC | PRN
  Administered 2015-12-31: 5 mL

## 2015-12-31 MED ORDER — KETOROLAC TROMETHAMINE 30 MG/ML IJ SOLN
30.0000 mg | Freq: Once | INTRAMUSCULAR | Status: AC
  Administered 2015-12-31: 30 mg via INTRAVENOUS

## 2015-12-31 MED ORDER — HEPARIN SOD (PORK) LOCK FLUSH 100 UNIT/ML IV SOLN
INTRAVENOUS | Status: DC | PRN
  Administered 2015-12-31: 500 [IU]

## 2015-12-31 MED ORDER — KETOROLAC TROMETHAMINE 30 MG/ML IJ SOLN
INTRAMUSCULAR | Status: AC
Start: 2015-12-31 — End: 2015-12-31
  Filled 2015-12-31: qty 1

## 2015-12-31 MED ORDER — HEPARIN SOD (PORK) LOCK FLUSH 100 UNIT/ML IV SOLN
INTRAVENOUS | Status: AC
  Filled 2015-12-31: qty 5

## 2015-12-31 MED ORDER — FENTANYL CITRATE (PF) 100 MCG/2ML IJ SOLN
25.0000 ug | INTRAMUSCULAR | Status: DC | PRN
Start: 2015-12-31 — End: 2015-12-31
  Administered 2015-12-31: 25 ug via INTRAVENOUS

## 2015-12-31 MED ORDER — LIDOCAINE HCL (PF) 1 % IJ SOLN
INTRAMUSCULAR | Status: DC | PRN
  Administered 2015-12-31: 10 mL

## 2015-12-31 MED ORDER — MIDAZOLAM HCL 2 MG/2ML IJ SOLN
1.0000 mg | INTRAMUSCULAR | Status: DC | PRN
  Administered 2015-12-31: 2 mg via INTRAVENOUS

## 2015-12-31 MED ORDER — MIDAZOLAM HCL 2 MG/2ML IJ SOLN
INTRAMUSCULAR | Status: AC
  Filled 2015-12-31: qty 2

## 2015-12-31 MED ORDER — FENTANYL CITRATE (PF) 100 MCG/2ML IJ SOLN
INTRAMUSCULAR | Status: AC
  Filled 2015-12-31: qty 2

## 2015-12-31 MED ORDER — LIDOCAINE HCL (PF) 1 % IJ SOLN
INTRAMUSCULAR | Status: AC
  Filled 2015-12-31: qty 30

## 2015-12-31 MED ORDER — CEFAZOLIN SODIUM-DEXTROSE 2-4 GM/100ML-% IV SOLN
2.0000 g | INTRAVENOUS | Status: AC
  Administered 2015-12-31: 2 g via INTRAVENOUS
  Filled 2015-12-31: qty 100

## 2015-12-31 MED ORDER — LACTATED RINGERS IV SOLN
INTRAVENOUS | Status: DC
  Administered 2015-12-31: 08:00:00 via INTRAVENOUS

## 2015-12-31 SURGICAL SUPPLY — 31 items
BAG DECANTER FOR FLEXI CONT (MISCELLANEOUS) ×3 IMPLANT
BAG HAMPER (MISCELLANEOUS) ×3 IMPLANT
CHLORAPREP W/TINT 10.5 ML (MISCELLANEOUS) ×3 IMPLANT
CLOTH BEACON ORANGE TIMEOUT ST (SAFETY) ×3 IMPLANT
COVER LIGHT HANDLE STERIS (MISCELLANEOUS) ×6 IMPLANT
DECANTER SPIKE VIAL GLASS SM (MISCELLANEOUS) ×3 IMPLANT
DERMABOND ADVANCED (GAUZE/BANDAGES/DRESSINGS) ×2
DERMABOND ADVANCED .7 DNX12 (GAUZE/BANDAGES/DRESSINGS) ×1 IMPLANT
DRAPE C-ARM FOLDED MOBILE STRL (DRAPES) ×3 IMPLANT
ELECT REM PT RETURN 9FT ADLT (ELECTROSURGICAL) ×3
ELECTRODE REM PT RTRN 9FT ADLT (ELECTROSURGICAL) ×1 IMPLANT
GLOVE BIOGEL PI IND STRL 7.0 (GLOVE) ×2 IMPLANT
GLOVE BIOGEL PI INDICATOR 7.0 (GLOVE) ×4
GLOVE ECLIPSE 6.5 STRL STRAW (GLOVE) ×3 IMPLANT
GLOVE EXAM NITRILE MD LF STRL (GLOVE) ×3 IMPLANT
GLOVE SURG SS PI 7.5 STRL IVOR (GLOVE) ×3 IMPLANT
GOWN STRL REUS W/TWL LRG LVL3 (GOWN DISPOSABLE) ×6 IMPLANT
IV NS 500ML (IV SOLUTION) ×2
IV NS 500ML BAXH (IV SOLUTION) ×1 IMPLANT
KIT PORT POWER 8FR ISP MRI (Port) ×3 IMPLANT
KIT ROOM TURNOVER APOR (KITS) ×3 IMPLANT
MANIFOLD NEPTUNE II (INSTRUMENTS) ×3 IMPLANT
NEEDLE HYPO 25X1 1.5 SAFETY (NEEDLE) ×3 IMPLANT
PACK MINOR (CUSTOM PROCEDURE TRAY) ×3 IMPLANT
PAD ARMBOARD 7.5X6 YLW CONV (MISCELLANEOUS) ×3 IMPLANT
SET BASIN LINEN APH (SET/KITS/TRAYS/PACK) ×3 IMPLANT
SUT VIC AB 3-0 SH 27 (SUTURE) ×2
SUT VIC AB 3-0 SH 27X BRD (SUTURE) ×1 IMPLANT
SUT VIC AB 4-0 PS2 27 (SUTURE) ×3 IMPLANT
SYR 20CC LL (SYRINGE) ×3 IMPLANT
SYR CONTROL 10ML LL (SYRINGE) ×3 IMPLANT

## 2015-12-31 NOTE — Anesthesia Preprocedure Evaluation (Addendum)
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    Abdominal   Peds  Hematology   Anesthesia Other Findings   Reproductive/Obstetrics                            Anesthesia Physical Anesthesia Plan  ASA: III  Anesthesia Plan: MAC   Post-op Pain Management:    Induction: Intravenous  Airway Management Planned: Simple Face Mask  Additional Equipment:   Intra-op Plan:   Post-operative Plan:   Informed Consent: I have reviewed the patients History and Physical, chart, labs and discussed the procedure including the risks, benefits and alternatives for the proposed anesthesia with the patient or authorized representative who has indicated his/her understanding and acceptance.     Plan Discussed with:   Anesthesia Plan Comments:         Anesthesia Quick Evaluation

## 2015-12-31 NOTE — Discharge Instructions (Signed)
Implanted Port Insertion, Care After °Refer to this sheet in the next few weeks. These instructions provide you with information on caring for yourself after your procedure. Your health care provider may also give you more specific instructions. Your treatment has been planned according to current medical practices, but problems sometimes occur. Call your health care provider if you have any problems or questions after your procedure. °WHAT TO EXPECT AFTER THE PROCEDURE °After your procedure, it is typical to have the following:  °· Discomfort at the port insertion site. Ice packs to the area will help. °· Bruising on the skin over the port. This will subside in 3-4 days. °HOME CARE INSTRUCTIONS °· After your port is placed, you will get a manufacturer's information card. The card has information about your port. Keep this card with you at all times.   °· Know what kind of port you have. There are many types of ports available.   °· Wear a medical alert bracelet in case of an emergency. This can help alert health care workers that you have a port.   °· The port can stay in for as long as your health care provider believes it is necessary.   °· A home health care nurse may give medicines and take care of the port.   °· You or a family member can get special training and directions for giving medicine and taking care of the port at home.   °SEEK MEDICAL CARE IF:  °· Your port does not flush or you are unable to get a blood return.   °· You have a fever or chills. °SEEK IMMEDIATE MEDICAL CARE IF: °· You have new fluid or pus coming from your incision.   °· You notice a bad smell coming from your incision site.   °· You have swelling, pain, or more redness at the incision or port site.   °· You have chest pain or shortness of breath. °  °This information is not intended to replace advice given to you by your health care provider. Make sure you discuss any questions you have with your health care provider. °  °Document  Released: 12/08/2012 Document Revised: 02/22/2013 Document Reviewed: 12/08/2012 °Elsevier Interactive Patient Education ©2016 Elsevier Inc[employer] °Implanted Port Home Guide °An implanted port is a type of central line that is placed under the skin. Central lines are used to provide IV access when treatment or nutrition needs to be given through a person's veins. Implanted ports are used for long-term IV access. An implanted port may be placed because:  °· You need IV medicine that would be irritating to the small veins in your hands or arms.   °· You need long-term IV medicines, such as antibiotics.   °· You need IV nutrition for a long period.   °· You need frequent blood draws for lab tests.   °· You need dialysis.   °Implanted ports are usually placed in the chest area, but they can also be placed in the upper arm, the abdomen, or the leg. An implanted port has two main parts:  °· Reservoir. The reservoir is round and will appear as a small, raised area under your skin. The reservoir is the part where a needle is inserted to give medicines or draw blood.   °· Catheter. The catheter is a thin, flexible tube that extends from the reservoir. The catheter is placed into a large vein. Medicine that is inserted into the reservoir goes into the catheter and then into the vein.   °HOW WILL I CARE FOR MY INCISION SITE? °Do not get the   incision site wet. Bathe or shower as directed by your health care provider.  °HOW IS MY PORT ACCESSED? °Special steps must be taken to access the port:  °· Before the port is accessed, a numbing cream can be placed on the skin. This helps numb the skin over the port site.   °· Your health care provider uses a sterile technique to access the port. °· Your health care provider must put on a mask and sterile gloves. °· The skin over your port is cleaned carefully with an antiseptic and allowed to dry. °· The port is gently pinched between sterile gloves, and a needle is inserted into the  port. °· Only "non-coring" port needles should be used to access the port. Once the port is accessed, a blood return should be checked. This helps ensure that the port is in the vein and is not clogged.   °· If your port needs to remain accessed for a constant infusion, a clear (transparent) bandage will be placed over the needle site. The bandage and needle will need to be changed every week, or as directed by your health care provider.   °· Keep the bandage covering the needle clean and dry. Do not get it wet. Follow your health care provider's instructions on how to take a shower or bath while the port is accessed.   °· If your port does not need to stay accessed, no bandage is needed over the port.   °WHAT IS FLUSHING? °Flushing helps keep the port from getting clogged. Follow your health care provider's instructions on how and when to flush the port. Ports are usually flushed with saline solution or a medicine called heparin. The need for flushing will depend on how the port is used.  °· If the port is used for intermittent medicines or blood draws, the port will need to be flushed:   °· After medicines have been given.   °· After blood has been drawn.   °· As part of routine maintenance.   °· If a constant infusion is running, the port may not need to be flushed.   °HOW LONG WILL MY PORT STAY IMPLANTED? °The port can stay in for as long as your health care provider thinks it is needed. When it is time for the port to come out, surgery will be done to remove it. The procedure is similar to the one performed when the port was put in.  °WHEN SHOULD I SEEK IMMEDIATE MEDICAL CARE? °When you have an implanted port, you should seek immediate medical care if:  °· You notice a bad smell coming from the incision site.   °· You have swelling, redness, or drainage at the incision site.   °· You have more swelling or pain at the port site or the surrounding area.   °· You have a fever that is not controlled with  medicine. °  °This information is not intended to replace advice given to you by your health care provider. Make sure you discuss any questions you have with your health care provider. °  °Document Released: 02/17/2005 Document Revised: 12/08/2012 Document Reviewed: 10/25/2012 °Elsevier Interactive Patient Education ©2016 Elsevier Inc. ° °

## 2015-12-31 NOTE — Transfer of Care (Signed)
Immediate Anesthesia Transfer of Care Note  Patient: Micheal Davis  Procedure(s) Performed: Procedure(s): INSERTION PORT-A-CATH LEFT SUBCLAVIAN (Left)  Patient Location: PACU  Anesthesia Type:MAC  Level of Consciousness: awake and patient cooperative  Airway & Oxygen Therapy: Patient Spontanous Breathing and Patient connected to nasal cannula oxygen  Post-op Assessment: Report given to RN and Post -op Vital signs reviewed and stable  Post vital signs: Reviewed and stable  Last Vitals:  Vitals:   12/31/15 0746  Temp: 36.8 C    Last Pain:  Vitals:   12/31/15 0746  TempSrc: Oral      Patients Stated Pain Goal: 4 ([id number])  Complications: No apparent anesthesia complications

## 2015-12-31 NOTE — Anesthesia Procedure Notes (Addendum)
Procedure Name: MAC Date/Time: 12/31/2015 8:43 AM Performed by: Vista Deck Pre-anesthesia Checklist: Patient identified, Emergency Drugs available, Suction available, Timeout performed and Patient being monitored Patient Re-evaluated:Patient Re-evaluated prior to inductionOxygen Delivery Method: Non-rebreather mask

## 2015-12-31 NOTE — Anesthesia Postprocedure Evaluation (Signed)
Anesthesia Post Note  Patient: Micheal Davis  Procedure(s) Performed: Procedure(s) (LRB): INSERTION PORT-A-CATH LEFT SUBCLAVIAN (Left)  Patient location during evaluation: PACU Anesthesia Type: MAC Level of consciousness: awake and alert Pain management: satisfactory to patient Vital Signs Assessment: post-procedure vital signs reviewed and stable Respiratory status: spontaneous breathing Cardiovascular status: stable Anesthetic complications: no    Last Vitals:  Vitals:   12/31/15 1000 12/31/15 1024  BP: (!) 141/83 (!) 142/72  Pulse: (!) 58 (!) 57  Resp: 18 16  Temp:  36.4 C    Last Pain:  Vitals:   12/31/15 1024  TempSrc: Oral                 Chetan Mehring

## 2015-12-31 NOTE — Op Note (Signed)
Patient:  Micheal Davis  DOB:  19-Feb-1946  MRN:  [id number]   Preop Diagnosis:  Metastatic prostate cancer to bone  Postop Diagnosis:  Same  Procedure:  Port-A-Cath insertion  Surgeon:  Aviva Signs, M.D.  Anes:  Mac  Indications:  Patient is a 70 year old white male who presents for a Port-A-Cath insertion due to the need for chemotherapy. The risks and benefits of the procedure including bleeding, infection, and pneumothorax were fully explained to the patient, who gave informed consent.  Procedure note:  The patient was placed in the Trendelenburg position after the left upper chest was prepped and draped using the usual sterile technique with DuraPrep. Surgical site confirmation was performed. 1% Xylocaine was used for local anesthesia.  An incision was made below left clavicle. A subcutaneous pocket was then formed. A needle is advanced into the left subclavian vein using the Seldinger technique without difficulty. A guidewire was then advanced into the right atrium under fluoroscopic guidance. An introducer and peel-away sheath were placed over the guidewire. The catheter was then inserted through the peel-away sheath and the peel-away sheath was removed. The catheter was then attached to the port and the port placed in subcutaneous pocket. Adequate positioning was confirmed by fluoroscopy. Good backflow blood was noted in the port. A power port was inserted. The port was flushed with heparin flush. The subcutaneous layer was reapproximated using 3-0 Vicryl interrupted suture. The skin was closed using a 4-0 Vicryl subcuticular suture. Dermabond was then applied.  All tape and needle counts were correct at the end of the procedure. Patient was transferred to PACU in stable condition. A chest x-ray will be performed at that time.  Complications:  None  EBL:  Minimal  Specimen:  None

## 2015-12-31 NOTE — Interval H&P Note (Signed)
History and Physical Interval Note:  12/31/2015 8:08 AM  Micheal Davis  has presented today for surgery, with the diagnosis of prostate cancer  The various methods of treatment have been discussed with the patient and family. After consideration of risks, benefits and other options for treatment, the patient has consented to  Procedure(s): INSERTION PORT-A-CATH (Right) as a surgical intervention .  The patient's history has been reviewed, patient examined, no change in status, stable for surgery.  I have reviewed the patient's chart and labs.  Questions were answered to the patient's satisfaction.     Aviva Signs A

## 2016-01-01 ENCOUNTER — Encounter (HOSPITAL_COMMUNITY): Payer: Medicare Other

## 2016-01-02 ENCOUNTER — Encounter (HOSPITAL_COMMUNITY): Payer: Self-pay | Admitting: General Surgery

## 2016-01-04 ENCOUNTER — Encounter (HOSPITAL_COMMUNITY): Payer: Self-pay | Admitting: Hematology & Oncology

## 2016-01-04 ENCOUNTER — Encounter (HOSPITAL_COMMUNITY): Payer: Medicare Other | Attending: Hematology & Oncology

## 2016-01-04 ENCOUNTER — Encounter (HOSPITAL_BASED_OUTPATIENT_CLINIC_OR_DEPARTMENT_OTHER): Payer: Medicare Other | Admitting: Hematology & Oncology

## 2016-01-04 VITALS — BP 157/80 | HR 55 | Temp 97.7°F | Resp 16 | Wt 261.4 lb

## 2016-01-04 DIAGNOSIS — C61 Malignant neoplasm of prostate: Secondary | ICD-10-CM

## 2016-01-04 DIAGNOSIS — G893 Neoplasm related pain (acute) (chronic): Secondary | ICD-10-CM

## 2016-01-04 DIAGNOSIS — C7951 Secondary malignant neoplasm of bone: Secondary | ICD-10-CM | POA: Insufficient documentation

## 2016-01-04 DIAGNOSIS — R197 Diarrhea, unspecified: Secondary | ICD-10-CM

## 2016-01-04 DIAGNOSIS — D649 Anemia, unspecified: Secondary | ICD-10-CM

## 2016-01-04 DIAGNOSIS — Z5111 Encounter for antineoplastic chemotherapy: Secondary | ICD-10-CM

## 2016-01-04 LAB — COMPREHENSIVE METABOLIC PANEL
ALBUMIN: 3.6 g/dL (ref 3.5–5.0)
ALT: 19 U/L (ref 17–63)
ANION GAP: 10 (ref 5–15)
AST: 31 U/L (ref 15–41)
Alkaline Phosphatase: 182 U/L — ABNORMAL HIGH (ref 38–126)
BUN: 21 mg/dL — ABNORMAL HIGH (ref 6–20)
CHLORIDE: 104 mmol/L (ref 101–111)
CO2: 25 mmol/L (ref 22–32)
CREATININE: 0.99 mg/dL (ref 0.61–1.24)
Calcium: 9.4 mg/dL (ref 8.9–10.3)
GFR calc non Af Amer: >60 mL/min (ref >60)
GLUCOSE: 215 mg/dL — AB (ref 65–99)
Potassium: 4.1 mmol/L (ref 3.5–5.1)
SODIUM: 139 mmol/L (ref 135–145)
Total Bilirubin: 0.5 mg/dL (ref 0.3–1.2)
Total Protein: 6.4 g/dL — ABNORMAL LOW (ref 6.5–8.1)

## 2016-01-04 LAB — CBC WITH DIFFERENTIAL/PLATELET
BASOS PCT: 0 %
Basophils Absolute: 0 10*3/uL (ref 0.0–0.1)
EOS ABS: 0 10*3/uL (ref 0.0–0.7)
EOS PCT: 0 %
HCT: 29.1 % — ABNORMAL LOW (ref 39.0–52.0)
HEMOGLOBIN: 9.7 g/dL — AB (ref 13.0–17.0)
LYMPHS ABS: 0.6 10*3/uL — AB (ref 0.7–4.0)
Lymphocytes Relative: 8 %
MCH: 31.1 pg (ref 26.0–34.0)
MCHC: 33.3 g/dL (ref 30.0–36.0)
MCV: 93.3 fL (ref 78.0–100.0)
Monocytes Absolute: 0.2 10*3/uL (ref 0.1–1.0)
Monocytes Relative: 2 %
NEUTROS PCT: 90 %
Neutro Abs: 6.9 10*3/uL (ref 1.7–7.7)
PLATELETS: 212 10*3/uL (ref 150–400)
RBC: 3.12 MIL/uL — AB (ref 4.22–5.81)
RDW: 14.6 % (ref 11.5–15.5)
WBC: 7.7 10*3/uL (ref 4.0–10.5)

## 2016-01-04 LAB — VITAMIN B12: VITAMIN B 12: 2917 pg/mL — AB (ref 180–914)

## 2016-01-04 LAB — IRON AND TIBC
IRON: 72 ug/dL (ref 45–182)
Saturation Ratios: 26 % (ref 17.9–39.5)
TIBC: 276 ug/dL (ref 250–450)
UIBC: 204 ug/dL

## 2016-01-04 LAB — FERRITIN: Ferritin: 768 ng/mL — ABNORMAL HIGH (ref 24–336)

## 2016-01-04 LAB — FOLATE: Folate: 32.6 ng/mL (ref >5.9)

## 2016-01-04 LAB — PSA: PSA: 84.08 ng/mL — AB (ref 0.00–4.00)

## 2016-01-04 MED ORDER — SODIUM CHLORIDE 0.9% FLUSH
10.0000 mL | INTRAVENOUS | Status: DC | PRN
  Administered 2016-01-04: 10 mL
  Filled 2016-01-04: qty 10

## 2016-01-04 MED ORDER — SODIUM CHLORIDE 0.9 % IV SOLN: 10.0000 mg | Freq: Once | INTRAVENOUS | Status: DC

## 2016-01-04 MED ORDER — SODIUM CHLORIDE 0.9 % IV SOLN
Freq: Once | INTRAVENOUS | Status: AC
  Administered 2016-01-04: 10:00:00 via INTRAVENOUS

## 2016-01-04 MED ORDER — PEGFILGRASTIM 6 MG/0.6ML ~~LOC~~ PSKT
6.0000 mg | PREFILLED_SYRINGE | Freq: Once | SUBCUTANEOUS | Status: AC
  Administered 2016-01-04: 6 mg via SUBCUTANEOUS
  Filled 2016-01-04: qty 0.6

## 2016-01-04 MED ORDER — HEPARIN SOD (PORK) LOCK FLUSH 100 UNIT/ML IV SOLN
500.0000 [IU] | Freq: Once | INTRAVENOUS | Status: AC | PRN
  Administered 2016-01-04: 500 [IU]
  Filled 2016-01-04: qty 5

## 2016-01-04 MED ORDER — DEXAMETHASONE SODIUM PHOSPHATE 10 MG/ML IJ SOLN
10.0000 mg | Freq: Once | INTRAMUSCULAR | Status: AC
  Administered 2016-01-04: 10 mg via INTRAVENOUS

## 2016-01-04 MED ORDER — DEXAMETHASONE SODIUM PHOSPHATE 10 MG/ML IJ SOLN
INTRAMUSCULAR | Status: AC
  Filled 2016-01-04: qty 1

## 2016-01-04 MED ORDER — DOCETAXEL CHEMO INJECTION 160 MG/16ML
64.0000 mg/m2 | Freq: Once | INTRAVENOUS | Status: AC
  Administered 2016-01-04: 150 mg via INTRAVENOUS
  Filled 2016-01-04: qty 8

## 2016-01-04 NOTE — Patient Instructions (Signed)
[facility] Discharge Instructions for Patients Receiving Chemotherapy   Beginning January 23rd 2017 lab work for the Cancer Center will be done in the  Main lab at [place] on 1st floor. If you have a lab appointment with the Cancer Center please come in thru the  Main Entrance and check in at the main information desk   Today you received the following chemotherapy agents Taxotere  To help prevent nausea and vomiting after your treatment, we encourage you to take your nausea medication    If you develop nausea and vomiting, or diarrhea that is not controlled by your medication, call the clinic.  The clinic phone number is [phone]. Office hours are Monday-Friday 8:30am-5:00pm.  BELOW ARE SYMPTOMS THAT SHOULD BE REPORTED IMMEDIATELY:  *FEVER GREATER THAN 101.0 F  *CHILLS WITH OR WITHOUT FEVER  NAUSEA AND VOMITING THAT IS NOT CONTROLLED WITH YOUR NAUSEA MEDICATION  *UNUSUAL SHORTNESS OF BREATH  *UNUSUAL BRUISING OR BLEEDING  TENDERNESS IN MOUTH AND THROAT WITH OR WITHOUT PRESENCE OF ULCERS  *URINARY PROBLEMS  *BOWEL PROBLEMS  UNUSUAL RASH Items with * indicate a potential emergency and should be followed up as soon as possible. If you have an emergency after office hours please contact your primary care physician or go to the nearest emergency department.  Please call the clinic during office hours if you have any questions or concerns.   You may also contact the Patient Navigator at [phone] should you have any questions or need assistance in obtaining follow up care.      Resources For Cancer Patients and their Caregivers ? American Cancer Society: Can assist with transportation, wigs, general needs, runs Look Good Feel Better.        1-888-227-6333 ? Cancer Care: Provides financial assistance, online support groups, medication/co-pay assistance.  1-800-813-HOPE (4673) ? [facility] Assists [place]  cancer patients and their families through emotional , educational and financial support.  336-427-4357 ? [place] DSS Where to apply for food stamps, Medicaid and utility assistance. 336-342-1394 ? RCATS: Transportation to medical appointments. 336-347-2287 ? Social Security Administration: May apply for disability if have a Stage IV cancer. 336-342-7796 1-800-772-1213 ? [place] Aging, Disability and Transit Services: Assists with nutrition, care and transit needs. 336-349-2343          

## 2016-01-04 NOTE — Progress Notes (Signed)
Chemotherapy given today per orders. Patient tolerated well, no problems. Vitals stable, Follow up as scheduled.Patient discharged ambulatory with wife.

## 2016-01-04 NOTE — Patient Instructions (Signed)
[place] at [facility] Discharge Instructions  RECOMMENDATIONS MADE BY THE CONSULTANT AND ANY TEST RESULTS WILL BE SENT TO YOUR REFERRING PHYSICIAN.  [place] at [facility] Discharge Instructions  RECOMMENDATIONS MADE BY THE CONSULTANT AND ANY TEST RESULTS WILL BE SENT TO YOUR REFERRING PHYSICIAN.  You saw Dr.Penland today. Labs today. Labs work only in 1 and 1/2 weeks. Follow up with MD with labs and chemo in 3 weeks.  See Amy at checkout for appointments.  Thank you for choosing [place] at [facility] to provide your oncology and hematology care.  To afford each patient quality time with our provider, please arrive at least 15 minutes before your scheduled appointment time.   Beginning January 23rd 2017 lab work for the [facility] will be done in the  Main lab at [facility] on 1st floor. If you have a lab appointment with the [facility] please come in thru the  Main Entrance and check in at the main information desk  You need to re-schedule your appointment should you arrive 10 or more minutes late.  We strive to give you quality time with our providers, and arriving late affects you and other patients whose appointments are after yours.  Also, if you no show three or more times for appointments you may be dismissed from the clinic at the providers discretion.     Again, thank you for choosing [facility].  Our hope is that these requests will decrease the amount of time that you wait before being seen by our physicians.       _____________________________________________________________  Should you have questions after your visit to [facility], please contact our office at [phone] between the hours of 8:30 a.m. and 4:30 p.m.  Voicemails left after 4:30 p.m. will not be returned until the following business day.  For prescription refill requests, have your pharmacy  contact our office.         Resources For Cancer Patients and their Caregivers ? American Cancer Society: Can assist with transportation, wigs, general needs, runs Look Good Feel Better.        2522914471 ? Cancer Care: Provides financial assistance, online support groups, medication/co-pay assistance.  1-800-813-HOPE 815-292-8488) ? [facility] Assists [place] cancer patients and their families through emotional , educational and financial support.  224-632-1780 ? [place] DSS Where to apply for food stamps, Medicaid and utility assistance. (412)630-7869 ? RCATS: Transportation to medical appointments. 959-034-9606 ? Social Security Administration: May apply for disability if have a Stage IV cancer. 364 079 1531 732 095 5293 ? [employer], Disability and Transit Services: Assists with nutrition, care and transit needs. [place] Support Programs: @10RELATIVEDAYS @ > Cancer Support Group  2nd Tuesday of the month 1pm-2pm, Journey Room  > Creative Journey  3rd Tuesday of the month 1130am-1pm, Journey Room  > Look Good Feel Better  1st Wednesday of the month 10am-12 noon, Journey Room (Call [place] to register [phone])     Thank you for choosing [place] at [facility] to provide your oncology and hematology care.  To afford each patient quality time with our provider, please arrive at least 15 minutes before your scheduled appointment time.   Beginning January 23rd 2017 lab work for the [facility] will be done in the  Main lab at [facility] on 1st floor. If you have a  lab appointment with the [facility] please come in thru the  Main Entrance and check in at the main information desk  You need to re-schedule your appointment should you arrive 10 or more minutes late.  We strive to give you quality time with our providers, and arriving late affects you and other  patients whose appointments are after yours.  Also, if you no show three or more times for appointments you may be dismissed from the clinic at the providers discretion.     Again, thank you for choosing [facility].  Our hope is that these requests will decrease the amount of time that you wait before being seen by our physicians.       _____________________________________________________________  Should you have questions after your visit to [facility], please contact our office at [phone] between the hours of 8:30 a.m. and 4:30 p.m.  Voicemails left after 4:30 p.m. will not be returned until the following business day.  For prescription refill requests, have your pharmacy contact our office.         Resources For Cancer Patients and their Caregivers ? American Cancer Society: Can assist with transportation, wigs, general needs, runs Look Good Feel Better.        (567)563-1831 ? Cancer Care: Provides financial assistance, online support groups, medication/co-pay assistance.  1-800-813-HOPE 2282031284) ? [facility] Assists [place] cancer patients and their families through emotional , educational and financial support.  (469) 576-5142 ? [place] DSS Where to apply for food stamps, Medicaid and utility assistance. (830) 593-9103 ? RCATS: Transportation to medical appointments. 340 006 6340 ? Social Security Administration: May apply for disability if have a Stage IV cancer. (908) 308-1984 930-726-1590 ? [employer], Disability and Transit Services: Assists with nutrition, care and transit needs. [place] Support Programs: @10RELATIVEDAYS @ > Cancer Support Group  2nd Tuesday of the month 1pm-2pm, Journey Room  > Creative Journey  3rd Tuesday of the month 1130am-1pm, Journey Room  > Look Good Feel Better  1st Wednesday of the month 10am-12 noon, Journey Room (Call [place] to  register [phone])
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
## 2016-01-10 ENCOUNTER — Encounter (HOSPITAL_COMMUNITY): Payer: Medicare Other

## 2016-01-10 ENCOUNTER — Other Ambulatory Visit (HOSPITAL_COMMUNITY): Payer: Self-pay | Admitting: Oncology

## 2016-01-10 ENCOUNTER — Encounter (HOSPITAL_COMMUNITY): Payer: Self-pay | Admitting: Hematology & Oncology

## 2016-01-10 ENCOUNTER — Encounter (HOSPITAL_BASED_OUTPATIENT_CLINIC_OR_DEPARTMENT_OTHER): Payer: Medicare Other | Admitting: Hematology & Oncology

## 2016-01-10 VITALS — BP 137/74 | HR 71 | Temp 97.9°F | Resp 16 | Wt 256.9 lb

## 2016-01-10 DIAGNOSIS — Z5111 Encounter for antineoplastic chemotherapy: Secondary | ICD-10-CM

## 2016-01-10 DIAGNOSIS — I10 Essential (primary) hypertension: Secondary | ICD-10-CM

## 2016-01-10 DIAGNOSIS — R197 Diarrhea, unspecified: Secondary | ICD-10-CM | POA: Diagnosis not present

## 2016-01-10 DIAGNOSIS — G893 Neoplasm related pain (acute) (chronic): Secondary | ICD-10-CM | POA: Diagnosis not present

## 2016-01-10 DIAGNOSIS — D6481 Anemia due to antineoplastic chemotherapy: Secondary | ICD-10-CM | POA: Diagnosis not present

## 2016-01-10 DIAGNOSIS — T451X5A Adverse effect of antineoplastic and immunosuppressive drugs, initial encounter: Secondary | ICD-10-CM

## 2016-01-10 DIAGNOSIS — E119 Type 2 diabetes mellitus without complications: Secondary | ICD-10-CM | POA: Diagnosis not present

## 2016-01-10 DIAGNOSIS — C61 Malignant neoplasm of prostate: Secondary | ICD-10-CM

## 2016-01-10 DIAGNOSIS — R9721 Rising PSA following treatment for malignant neoplasm of prostate: Secondary | ICD-10-CM

## 2016-01-10 DIAGNOSIS — K521 Toxic gastroenteritis and colitis: Secondary | ICD-10-CM

## 2016-01-10 DIAGNOSIS — C7951 Secondary malignant neoplasm of bone: Secondary | ICD-10-CM | POA: Diagnosis not present

## 2016-01-10 MED ORDER — LEUPROLIDE ACETATE 7.5 MG IM KIT
7.5000 mg | PACK | INTRAMUSCULAR | Status: DC
  Administered 2016-01-10: 7.5 mg via INTRAMUSCULAR

## 2016-01-10 MED ORDER — LEUPROLIDE ACETATE 7.5 MG IM KIT
7.5000 mg | PACK | INTRAMUSCULAR | Status: AC
Start: 2016-01-10 — End: ?
  Filled 2016-01-10: qty 7.5

## 2016-01-10 NOTE — Progress Notes (Signed)
Meet with pt to give him a little information about the new chemotherapy that he would be starting.  We are going to change him to [facility].  Explained it was very similar taxotere.  Every 3 weeks for 1 hour.  I told pt I would give him more extensive teaching on the first day of his chemotherapy.  He still has my contact information and I told him and his wife to call me if they had any problems.  They verbalized understanding.     Micheal Davis reason for visit today is for an injection and labs as scheduled per MD orders.  [name] also received lupron in right gluteal  per MD orders; see [facility] for administration details.  [name] tolerated all procedures well and without incident; questions were answered and patient was discharged.
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
## 2016-01-10 NOTE — Progress Notes (Signed)
Please see doctors encounter for more information 

## 2016-01-10 NOTE — Patient Instructions (Signed)
[facility] Discharge Instructions  RECOMMENDATIONS MADE BY THE CONSULTANT AND ANY TEST RESULTS WILL BE SENT TO YOUR REFERRING PHYSICIAN.  You saw Dr.Penland today. See Amy at checkout for appointments.  Thank you for choosing [facility] at [facility] to provide your oncology and hematology care.  To afford each patient quality time with our provider, please arrive at least 15 minutes before your scheduled appointment time.   Beginning January 23rd 2017 lab work for the Cancer Center will be done in the  Main lab at [place] on 1st floor. If you have a lab appointment with the Cancer Center please come in thru the  Main Entrance and check in at the main information desk  You need to re-schedule your appointment should you arrive 10 or more minutes late.  We strive to give you quality time with our providers, and arriving late affects you and other patients whose appointments are after yours.  Also, if you no show three or more times for appointments you may be dismissed from the clinic at the providers discretion.     Again, thank you for choosing [facility].  Our hope is that these requests will decrease the amount of time that you wait before being seen by our physicians.       _____________________________________________________________  Should you have questions after your visit to [facility], please contact our office at [phone] between the hours of 8:30 a.m. and 4:30 p.m.  Voicemails left after 4:30 p.m. will not be returned until the following business day.  For prescription refill requests, have your pharmacy contact our office.         Resources For Cancer Patients and their Caregivers ? American Cancer Society: Can assist with transportation, wigs, general needs, runs Look Good Feel Better.        1-888-227-6333 ? Cancer Care: Provides financial assistance, online support  groups, medication/co-pay assistance.  1-800-813-HOPE (4673) ? [facility] Assists [place] cancer patients and their families through emotional , educational and financial support.  336-427-4357 ? [place] DSS Where to apply for food stamps, Medicaid and utility assistance. 336-342-1394 ? [facility]: Transportation to medical appointments. 336-347-2287 ? Social Security Administration: May apply for disability if have a Stage IV cancer. 336-342-7796 1-800-772-1213 ? [place] Aging, Disability and Transit Services: Assists with nutrition, care and transit needs. 336-349-2343  Cancer Center Support Programs: @[email or web address]@ > Cancer Support Group  2nd Tuesday of the month 1pm-2pm, Journey Room  > Creative Journey  3rd Tuesday of the month 1130am-1pm, Journey Room  > Look Good Feel Better  1st Wednesday of the month 10am-12 noon, Journey Room (Call American Cancer Society to register 1-800-395-5775)    

## 2016-01-11 ENCOUNTER — Ambulatory Visit (HOSPITAL_COMMUNITY): Payer: Medicare Other

## 2016-01-14 ENCOUNTER — Other Ambulatory Visit (HOSPITAL_COMMUNITY): Payer: Self-pay | Admitting: Hematology & Oncology

## 2016-01-14 NOTE — Progress Notes (Signed)
DISCONTINUE ON PATHWAY REGIMEN - Prostate  POS37: Docetaxel 75 mg/m2 q21 Days + Prednisone 5 mg BID Until Progression or Toxicity   A cycle is every 21 days:     Docetaxel (Taxotere(R)) 75 mg/m2 in 250 mL NS IV over one hour Dose Mod: None     Prednisone 5 mg orally twice daily Dose Mod: None Additional Orders: Premedicate with dexamethasone 8 mg PO bid for three days beginning 1 day prior to therapy  **Always confirm dose/schedule in your pharmacy ordering system**    REASON: Disease Progression PRIOR TREATMENT: POS37: Docetaxel 75 mg/m2 q21 Days + Prednisone 5 mg BID Until Progression or Toxicity TREATMENT RESPONSE: Progressive Disease (PD)  START ON PATHWAY REGIMEN - Prostate  POS83: Cabazitaxel 20 mg/m2 q21 Days + Prednisone 10 mg Daily Until Progression   A cycle is every 21 days.:     Cabazitaxel (Jevtana(R)) 20 mg/m2 in 250 mL NS IV (non-PVC container) over one hour on day 1. Use in-line 0.22 micrometer filter. Final concentration 0.1 - 0.26 mg/mL. Dose Mod: None     Prednisone 10 mg orally daily throughout treatment with cabazitaxel Dose Mod: None Additional Orders: May consider administering prednisone as 5 mg PO BID.  **Always confirm dose/schedule in your pharmacy ordering system**    Patient Characteristics: Adenocarcinoma, Metastatic, Castration Resistant, Asymptomatic/Minimally Symptomatic, Third Line and Beyond AJCC T Stage: X AJCC Stage Grouping: IV Current radiographic evidence of distant metastasis? Yes PSA: X Gleason Primary: X Gleason Secondary: X Gleason Score: X AJCC M Stage: X AJCC N Stage: X Would you be surprised if this patient died  in the next year? I would be surprised if this patient died in the next year Line of therapy: Third Line and Beyond  Intent of Therapy: Non-Curative / Palliative Intent, Not Discussed with Patient

## 2016-01-15 ENCOUNTER — Encounter (HOSPITAL_COMMUNITY): Payer: Medicare Other

## 2016-01-15 ENCOUNTER — Other Ambulatory Visit (HOSPITAL_COMMUNITY): Payer: Self-pay

## 2016-01-15 DIAGNOSIS — C7951 Secondary malignant neoplasm of bone: Principal | ICD-10-CM

## 2016-01-15 DIAGNOSIS — C61 Malignant neoplasm of prostate: Secondary | ICD-10-CM | POA: Diagnosis not present

## 2016-01-15 LAB — CBC WITH DIFFERENTIAL/PLATELET
Basophils Absolute: 0 10*3/uL (ref 0.0–0.1)
Basophils Relative: 0 %
EOS ABS: 0 10*3/uL (ref 0.0–0.7)
EOS PCT: 0 %
HCT: 32.7 % — ABNORMAL LOW (ref 39.0–52.0)
Hemoglobin: 10.7 g/dL — ABNORMAL LOW (ref 13.0–17.0)
LYMPHS ABS: 1 10*3/uL (ref 0.7–4.0)
Lymphocytes Relative: 5 %
MCH: 30.6 pg (ref 26.0–34.0)
MCHC: 32.7 g/dL (ref 30.0–36.0)
MCV: 93.4 fL (ref 78.0–100.0)
MONO ABS: 0.6 10*3/uL (ref 0.1–1.0)
MONOS PCT: 3 %
Neutro Abs: 17.4 10*3/uL — ABNORMAL HIGH (ref 1.7–7.7)
Neutrophils Relative %: 91 %
PLATELETS: 147 10*3/uL — AB (ref 150–400)
RBC: 3.5 MIL/uL — AB (ref 4.22–5.81)
RDW: 14.9 % (ref 11.5–15.5)
WBC: 19.1 10*3/uL — AB (ref 4.0–10.5)

## 2016-01-15 MED ORDER — PREDNISONE 5 MG PO TABS: 5.0000 mg | ORAL_TABLET | Freq: Two times a day (BID) | ORAL | 0 refills | Status: DC

## 2016-01-15 NOTE — Telephone Encounter (Signed)
Patient came by cancer center for refill on prednisone. Verified with oncologist and prescription e-scribed to pharmacy.

## 2016-01-16 ENCOUNTER — Other Ambulatory Visit (HOSPITAL_COMMUNITY): Payer: Self-pay | Admitting: Oncology

## 2016-01-16 MED ORDER — ONDANSETRON HCL 8 MG PO TABS: 8.0000 mg | ORAL_TABLET | Freq: Two times a day (BID) | ORAL | 1 refills | Status: DC | PRN

## 2016-01-16 MED ORDER — LIDOCAINE-PRILOCAINE 2.5-2.5 % EX CREA: TOPICAL_CREAM | CUTANEOUS | 3 refills | Status: DC

## 2016-01-16 MED ORDER — PROCHLORPERAZINE MALEATE 10 MG PO TABS: 10.0000 mg | ORAL_TABLET | Freq: Four times a day (QID) | ORAL | 1 refills | Status: DC | PRN

## 2016-01-16 NOTE — Patient Instructions (Addendum)
[place]   CHEMOTHERAPY INSTRUCTIONS  You have been diagnosed with Stage 4 Prostate Cancer.  You were previously being treated with Taxotere.  Your PSA level has continued to rise so Dr Whitney Muse (and after consulting with Dr Alen Blew) is switching you to a chemotherapy called Jevtana.  Christinia Gully is given every 3 weeks.  3 weeks is 1 cycle.  This is given with palliative intent, which means you are treatable but not curable.   You will see the doctor regularly throughout treatment.  We monitor your lab work prior to every treatment.  The doctor monitors your response to treatment by the way you are feeling, your blood work, and scans periodically.  You will receive premeds prior to every chemotherapy.  These include: Premeds: Benadryl: helps prevent reaction to the chemotherapy.  Pepcid: antihistamine (takes 15 minutes to infuse)  Dexamethasone - steroid - given to reduce the risk of you having an allergic type reaction to the chemotherapy. Dex can cause you to feel energized, nervous/anxious/jittery, make you have trouble sleeping, and/or make you feel hot/flushed in the face/neck and/or look pink/red in the face/neck. These side effects will pass as the Dex wears off. (takes 30 minutes to infuse)  You will be given neulasta on pro the same day as chemotherapy every 3 weeks.  This will be placed either on your left or right arm prior to you leaving the clinic for the day. Neulasta - this medication is not chemo but being given because you have had chemo. It is usually given 24-27 hours after the completion of chemotherapy. This medication works by boosting your bone marrow's supply of white blood cells. White blood cells are what protect our bodies against infection. The medication is given in the form of a subcutaneous injection. It is given in the fatty tissue of your abdomen. It is a short needle. The major side effect of this medication is bone or muscle pain. The drug of choice to relieve  or lessen the pain is Aleve or Ibuprofen. If a physician has ever told you not to take Aleve or Ibuprofen - then don't take it. You should then take Tylenol/acetaminophen. Take either medication as the bottle directs you to.  The level of pain you experience as a result of this injection can range from none, to mild or moderate, or severe. Please let us know if you develop moderate or severe bone pain.   **DO NOT expose the Neulasta On-body injector to diagnostic imaging (CT scans, MRI, Ultrasound, X-ray), radiation treatment, or oxygen rich environments, such as hyperbaric chambers. **   POTENTIAL SIDE EFFECTS OF TREATMENT:  Cabazitaxel (Generic Name) Other Names: Jevtana  About This Drug Cabazitaxel is a drug used to treat cancer. This drug is given in the vein (IV).  Possible Side Effects (More Common) . Nausea and throwing up (vomiting). These symptoms may happen within a few hours. Medicines are available to stop or lessen these side effects. . Bone marrow depression. This is a decrease in the number of white blood cells, red blood cells, and platelets. Bone marrow depression usually happens 7 to 12 days after treatment and may raise your risk of infection, make you tired and weak (fatigue), and raise your risk of bleeding. . Effects on the nerves are called peripheral neuropathy. You may feel numbness, tingling, or pain in your hands and feet. It may be hard for you to button your clothes, open jars, or walk as usual. The effect on the nerves may  get worse with more doses of the drug. These effects get better in some people after the drug is stopped but it does not get better in all people. . Hair loss is often complete scalp hair loss and can involve loss of eyebrows, eyelashes, and pubic hair. You may notice this a few days or weeks after treatment has started. Often hair loss is temporary and will grow back once treatment is done. . Constipation (not able to move bowels) . Loose bowel  movements (diarrhea) that may last for several days . Blood in the urine  Possible Side Effects (Less Common) . Swelling, most often in your arms, hands, legs, or feet . Cough . Shortness of breath . Flu-like symptoms: fever, headache, muscle and joint aches, and fatigue (low energy, feeling weak) . Taste changes . Soreness of the mouth and throat. You may have red areas, white patches, or sores that hurt.  Allergic Reactions Serious allergic reactions including anaphylaxis are rare. While you are getting this drug in your vein (IV), tell your nurse right away if you have any of these symptoms of an allergic reaction: . Trouble catching your breath . Feeling like your tongue or throat are swelling . Feeling your heart beat quickly or in a not normal way (palpitations) . Feeling dizzy or lightheaded . Flushing, itching, rash, and/or hives  Treating Side Effects . Drink 6-8 cups of fluids each day unless your doctor has told you to limit your fluid intake due to some other health problem. A cup is 8 ounces of fluid. If you throw up or have loose bowel movements you should drink more fluids so that you do not become dehydrated (lack water in the body due to losing too much fluid). . Talk with your nurse about getting a wig before you lose your hair. Also, call the [facility] at 800-ACS-2345 to find out information about the "Look Good, Feel Better" program close to where you live. It is a free program where women getting chemotherapy can learn about wigs, turbans and scarves as well as makeup techniques and skin and nail care. . Do not put anything on a rash unless your doctor or nurse says you may. Keep the area around the rash clean and dry. Ask your doctor for medicine if your rash bothers you. . Mouth care is very important. Your mouth care should consist of routine, gentle cleaning of your teeth or dentures and rinsing your mouth with a mixture of 1/2 teaspoon of salt in 8  ounces of water or  teaspoon of baking soda in 8 ounces of water. This should be done at least after each meal and at bedtime. . If you have mouth sores, avoid mouthwash that contains alcohol. Avoid alcohol and smoking because they can irritate your mouth and throat. . Ask your doctor or nurse about medicine to help stop or lessen loose bowel movements (diarrhea), nausea, and vomiting. Take prescribed medicines to help relieve diarrhea, nausea, vomiting, and joint and muscle pain. . If you have numbness and tingling in your hands and feet, be careful when cooking, walking, and handling sharp objects and hot liquids. . Ask your doctor or nurse about medicines that are available to help stop or lessen constipation. . If you are not able to move your bowels, check with your doctor or nurse before you use enemas, laxatives, or suppositories  Food and Drug Interactions There are no known interactions of cabazitaxel with food. This drug may interact with other  medicines. Tell your doctor and pharmacist about all the medicines and dietary supplements (vitamins, minerals, herbs and others) that you are taking at this time. The safety and use of dietary supplements and alternative diets are often not known. Using these might affect your cancer or interfere with your treatment. Until more is known, you should not use dietary supplements or alternative diets without your cancer doctor's help.  When to Call the Doctor Call your doctor or nurse right away if you have any of these symptoms: . Fever of 100.5 F (38 C) or above . Chills . Bleeding or bruising that is not usual . Wheezing or trouble breathing . Redness or tenderness along a vein . Nausea that stops you from eating or drinking . Throwing up multiple times in one day . Loose bowel movements (diarrhea) more than three times a day, or diarrhea with weakness or feeling lightheaded . No bowel movement in 3 days or if you feel uncomfortable. .  Swelling of your arms, hands, legs or feet . Call your doctor or nurse as soon as possible if you have any of these symptoms: . Numbness, tingling, decreased feeling or weakness in fingers, toes, arms, or legs . Trouble walking or changes in the way you walk, feeling clumsy when buttoning clothes, opening jars, or other routine hand motions . Joint and muscle pain that is not relieved by prescribed medicines . Weight gain of more than 5 pounds in a week . Pain in your mouth or throat that makes it hard to eat or drink  Sexual Problems and Reproductive Concerns . Sexual problems and reproduction concerns may happen. In both men and women, this drug may affect your ability to have children. This cannot be determined before your treatment. Talk with your doctor or nurse if you plan to have children. Ask for information on sperm or egg banking. . In men, this drug may interfere with your ability to make sperm, but it should not change your ability to have sexual relations. . In women, menstrual bleeding may become irregular or stop while you are getting this drug. Do not assume that you cannot become pregnant if you do not have a menstrual period. Women may go through signs of menopause (change of life) like vaginal dryness or itching. Vaginal lubricants can be used to lessen vaginal dryness, itching, and pain during sexual relations. . Genetic counseling is available for you to talk about the effects of this drug therapy on future pregnancies. Also, a genetic counselor can look at the possible risk of problems in the unborn baby due to this medicine if an exposure happens during pregnancy. . Pregnancy warning: This drug may have harmful effects on the unborn child, so effective methods of birth control should be used during your cancer treatment. . Breast feeding warning: It is not known if this drug passes into breast milk. For this reason, women should talk to their doctor about the risks and  benefits of breast feeding during treatment with this drug because this drug may enter the breast milk and badly harm a breast feeding baby.   Prednisone (Generic Name) Other Names: Deltasone  About this drug Prednisone is a steroid that may be used to treat cancer. This drug is given by mouth.  Possible side effects (more common) . High number of white blood cells . Increased appetite and weight gain . More growth of facial hair . High blood sugars. Your blood glucose level will be checked as needed. . Electrolyte  changes. Your blood will be checked for electrolyte changes as needed. . More at risk for infections such as herpes zoster, fungus infections, and delayed wound healing. . High blood pressure. Your doctor will check your blood pressure as needed. . Swelling of your legs, ankles and/or feet . Mood changes, which may include depression or a feeling of extreme wellbeing . Muscle weakness . Blurred vision or other changes in eyesight . Feeling confused or seeing, hearing, or smelling things that are not there (hallucinations) . Feeling restless, nervous, or irritable . Trouble sleeping  Possible side effects (less common) . Skin reactions such as rash, acne, facial redness, reddish-purple lines on the skin, or shiny skin. . Bloody or black, tarry stools . Pain or burning in the stomach or abdomen . Mild nausea and throwing up (vomiting) . Headache . Seizures . Leg cramps  Allergic reactions Allergic reactions, including anaphylaxis are rare but may happen in some patients. Signs of allergic reactions to this drug may be swelling of the face, feeling like your tongue or throat are swelling, trouble breathing, rash, itching, fever, chills, feeling dizzy, and/or feeling that your heart is beating in a fast or not normal way. If you get symptoms of a reaction, do not take another dose of this drug. You should get urgent medical treatment.  Treating side effects . Drink  6-8 cups of fluids each day unless your doctor has told you to limit your fluid intake due to some other health problem. A cup is 8 ounces of fluid. If you throw up or have loose bowel movements, you should drink more fluids so that you do not become dehydrated (lack water in the body from losing too much fluid). . Ask your doctor or nurse about medicine to help you stop or lessen nausea and throwing up. . If you get a rash do not put anything on it unless your doctor or nurse says you may. Keep the area around the rash clean and dry. . Talk with your doctor or nurse if you feel you need help with your mood changes. . Limit your intake of alcohol and caffeine. . Do not take this drug close to bedtime; it may cause trouble sleeping. . Take this medicine with food to decrease the risk of upset stomach.  Important information . Prednisone should be taken with food. . If instructed by your doctor, check blood sugar levels during therapy and report them to the doctor if the levels are high.  Food and drug interactions There are no known interactions of prednisone with food. This drug may interact with other medicines. Tell your doctor and pharmacist about all the medicines and dietary supplements (vitamins, minerals, herbs and others) that you are taking at this time. The safety and use of dietary supplements and alternative diets are often not known. Using these might affect your cancer or interfere with your treatment. Until more is known, you should not use dietary supplements or alternative diets without your cancer doctor's help.  When to call the doctor Call your doctor or nurse right away if you have any of these symptoms: . Fever of 100.5 F (38 C) or higher . Chills . Easy bruising or bleeding . Wheezing or trouble breathing . Rash or itching . Feeling dizzy or lightheaded . Feeling that your heart is beating in a fast or not normal way (palpitations) . Loose bowel movements (diarrhea)  more than 4 times a day or diarrhea with weakness or feeling lightheaded . Blurred  vision or other changes in eyesight . Pain when passing urine; blood in urine . Pain in your lower back or side . Confusion or agitation . Nausea that stops you from eating or drinking . Throwing up more than 3 times a day . Chest pain or symptoms of a heart attack. Most heart attacks involve pain in the center of the chest that lasts more than a few minutes. The pain may go away and come back. It can feel like pressure, squeezing, fullness, or pain. Sometimes pain is felt in one or both arms, the back, neck, jaw, or stomach. If any of these symptoms last 2 minutes, call 911. Marland Kitchen Symptoms of a stroke such as sudden numbness or weakness of your face, arm, or leg, mostly on one side of your body; sudden confusion, trouble speaking or understanding; sudden trouble seeing in one or both eyes; sudden trouble walking, feeling dizzy, loss of balance or coordination; or sudden, bad headache with no known cause. If you have any of these symptoms for 2 minutes, call 911. . Signs of liver problems: dark urine, pale bowel movements, bad stomach pain, feeling very tired and weak, itching, or yellowing of the eyes or skin. Call your doctor or nurse as soon as possible if any of these symptoms happen: . Change in hearing, ringing in the ears . Decreased urine . Unusual thirst or passing urine often . Pain in your mouth or throat that makes it hard to eat or drink . Nausea that is not relieved by prescribed medicines . Rash that is not relieved by prescribed medicines . Heavy menstrual period that lasts longer than normal . Numbness, tingling, decreased feeling or weakness in fingers, toes, arms, or legs . Trouble walking or changes in the way you walk, feeling clumsy when buttoning clothes, opening jars, or other routine hand motions . Swelling of legs, ankles, or feet . Weight gain of 5 pounds in one week (fluid retention) .  Lasting loss of appetite or rapid weight loss of five pounds in a week . Fatigue that interferes with your daily activities . Headache that does not go away . Painful, red, or swollen areas on your hands or feet. . No bowel movement for 3 days or you feel uncomfortable . Extreme weakness that interferes with normal activities  Reproduction concerns . Pregnancy warning: It is not known if this drug may harm an unborn child. For this reason, be sure to talk with your doctor if you are pregnant or planning to become pregnant while getting this drug. . Breast feeding warning: Women should talk to their doctor about the risks and benefits of breast feeding during treatment with this drug because this drug may enter the breast milk and badly harma breast feeding baby.   EDUCATIONAL MATERIALS GIVEN AND REVIEWED: Information on jevtana and prednisone given.   SELF CARE ACTIVITIES WHILE ON CHEMOTHERAPY:  Hydration Increase your fluid intake 48 hours prior to treatment and drink at least 8 to 12 cups (64 ounces) of water/decaff beverages per day after treatment. You can still have your cup of coffee or soda but these beverages do not count as part of your 8 to 12 cups that you need to drink daily. No alcohol intake.  Medications Continue taking your normal prescription medication as prescribed.  If you start any new herbal or new supplements please let us know first to make sure it is safe.  Mouth Care Have teeth cleaned professionally before starting treatment. Keep dentures and  partial plates clean. Use soft toothbrush and do not use mouthwashes that contain alcohol. Biotene is a good mouthwash that is available at most pharmacies or may be ordered by calling [phone]. Use warm salt water gargles (1 teaspoon salt per 1 quart warm water) before and after meals and at bedtime. Or you may rinse with 2 tablespoons of three-percent hydrogen peroxide mixed in eight ounces of water. If you are still  having problems with your mouth or sores in your mouth please call the clinic. If you need dental work, please let Dr. Whitney Muse know before you go for your appointment so that we can coordinate the best possible time for you in regards to your chemo regimen. You need to also let your dentist know that you are actively taking chemo. We may need to do labs prior to your dental appointment.   Skin Care Always use sunscreen that has not expired and with SPF (Sun Protection Factor) of 50 or higher. Wear hats to protect your head from the sun. Remember to use sunscreen on your hands, ears, face, & feet.  Use good moisturizing lotions such as udder cream, eucerin, or even Vaseline. Some chemotherapies can cause dry skin, color changes in your skin and nails.    . Avoid long, hot showers or baths. . Use gentle, fragrance-free soaps and laundry detergent. . Use moisturizers, preferably creams or ointments rather than lotions because the thicker consistency is better at preventing skin dehydration. Apply the cream or ointment within 15 minutes of showering. Reapply moisturizer at night, and moisturize your hands every time after you wash them.  Hair Loss (if your doctor says your hair will fall out)  . If your doctor says that your hair is likely to fall out, decide before you begin chemo whether you want to wear a wig. You may want to shop before treatment to match your hair color. . Hats, turbans, and scarves can also camouflage hair loss, although some people prefer to leave their heads uncovered. If you go bare-headed outdoors, be sure to use sunscreen on your scalp. . Cut your hair short. It eases the inconvenience of shedding lots of hair, but it also can reduce the emotional impact of watching your hair fall out. . Don't perm or color your hair during chemotherapy. Those chemical treatments are already damaging to hair and can enhance hair loss. Once your chemo treatments are done and your hair has grown  back, it's OK to resume dyeing or perming hair. With chemotherapy, hair loss is almost always temporary. But when it grows back, it may be a different color or texture. In older adults who still had hair color before chemotherapy, the new growth may be completely gray.  Often, new hair is very fine and soft.  Infection Prevention Please wash your hands for at least 30 seconds using warm soapy water. Handwashing is the #1 way to prevent the spread of germs. Stay away from sick people or people who are getting over a cold. If you develop respiratory systems such as green/yellow mucus production or productive cough or persistent cough let us know and we will see if you need an antibiotic. It is a good idea to keep a pair of gloves on when going into grocery stores/[employer] to decrease your risk of coming into contact with germs on the carts, etc. Carry alcohol hand gel with you at all times and use it frequently if out in public. If your temperature reaches 100.5 or higher please  call the clinic and let us know.  If it is after hours or on the weekend please go to the ER if your temperature is over 100.5.  Please have your own personal thermometer at home to use.    Sex and bodily fluids If you are going to have sex, a condom must be used to protect the person that isn't taking chemotherapy. Chemo can decrease your libido (sex drive). For a few days after chemotherapy, chemotherapy can be excreted through your bodily fluids.  When using the toilet please close the lid and flush the toilet twice.  Do this for a few day after you have had chemotherapy.   Effects of chemotherapy on your sex life Some changes are simple and won't last long. They won't affect your sex life permanently. Sometimes you may feel: . too tired . not strong enough to be very active . sick or sore  . not in the mood . anxious or low Your anxiety might not seem related to sex. For example, you may be worried about the cancer and how  your treatment is going. Or you may be worried about money, or about how you family are coping with your illness. These things can cause stress, which can affect your interest in sex. It's important to talk to your partner about how you feel. Remember - the changes to your sex life don't usually last long. There's usually no medical reason to stop having sex during chemo. The drugs won't have any long term physical effects on your performance or enjoyment of sex. Cancer can't be passed on to your partner during sex  Contraception It's important to use reliable contraception during treatment. Avoid getting pregnant while you or your partner are having chemotherapy. This is because the drugs may harm the baby. Sometimes chemotherapy drugs can leave a man or woman infertile.  This means you would not be able to have children in the future. You might want to talk to someone about permanent infertility. It can be very difficult to learn that you may no longer be able to have children. Some people find counselling helpful. There might be ways to preserve your fertility, although this is easier for men than for women. You may want to speak to a fertility expert. You can talk about sperm banking or harvesting your eggs. You can also ask about other fertility options, such as donor eggs. If you have or have had breast cancer, your doctor might advise you not to take the contraceptive pill. This is because the hormones in it might affect the cancer.  It is not known for sure whether or not chemotherapy drugs can be passed on through semen or secretions from the vagina. Because of this some doctors advise people to use a barrier method if you have sex during treatment. This applies to vaginal, anal or oral sex. Generally, doctors advise a barrier method only for the time you are actually having the treatment and for about a week after your treatment. Advice like this can be worrying, but this does not mean that you  have to avoid being intimate with your partner. You can still have close contact with your partner and continue to enjoy sex.  Animals If you have cats or birds we just ask that you not change the litter or change the cage.  Please have someone else do this for you while you are on chemotherapy.   Food Safety During and After Cancer Treatment Food safety is important  for people both during and after cancer treatment. Cancer and cancer treatments, such as chemotherapy, radiation therapy, and stem cell/bone marrow transplantation, often weaken the immune system. This makes it harder for your body to protect itself from foodborne illness, also called food poisoning. Foodborne illness is caused by eating food that contains harmful bacteria, parasites, or viruses.  Foods to avoid Some foods have a higher risk of becoming tainted with bacteria. These include: Marland Kitchen Unwashed fresh fruit and vegetables, especially leafy vegetables that can hide dirt and other contaminants . Raw sprouts, such as alfalfa sprouts . Raw or undercooked beef, especially ground beef, or other raw or undercooked meat and poultry . Fatty, fried, or spicy foods immediately before or after treatment.  These can sit heavy on your stomach and make you feel nauseous. . Raw or undercooked shellfish, such as oysters. . Sushi and sashimi, which often contain raw fish.  . Unpasteurized beverages, such as unpasteurized fruit juices, raw milk, raw yogurt, or cider . Undercooked eggs, such as soft boiled, over easy, and poached; raw, unpasteurized eggs; or foods made with raw egg, such as homemade raw cookie dough and homemade mayonnaise Simple steps for food safety Shop smart. . Do not buy food stored or displayed in an unclean area. . Do not buy bruised or damaged fruits or vegetables. . Do not buy cans that have cracks, dents, or bulges. . Pick up foods that can spoil at the end of your shopping trip and store them in a cooler on the way  home. Prepare and clean up foods carefully. . Rinse all fresh fruits and vegetables under running water, and dry them with a clean towel or paper towel. . Clean the top of cans before opening them. . After preparing food, wash your hands for 20 seconds with hot water and soap. Pay special attention to areas between fingers and under nails. . Clean your utensils and dishes with hot water and soap. Marland Kitchen Disinfect your kitchen and cutting boards using 1 teaspoon of liquid, unscented bleach mixed into 1 quart of water.   Dispose of old food. . Eat canned and packaged food before its expiration date (the "use by" or "best before" date). . Consume refrigerated leftovers within 3 to 4 days. After that time, throw out the food. Even if the food does not smell or look spoiled, it still may be unsafe. Some bacteria, such as Listeria, can grow even on foods stored in the refrigerator if they are kept for too long. Take precautions when eating out. . At restaurants, avoid buffets and salad bars where food sits out for a long time and comes in contact with many people. Food can become contaminated when someone with a virus, often a norovirus, or another "bug" handles it. . Put any leftover food in a "to-go" container yourself, rather than having the server do it. And, refrigerate leftovers as soon as you get home. . Choose restaurants that are clean and that are willing to prepare your food as you order it cooked.   MEDICATIONS:  Prednisone 5 mg: Take 1 tablet (5 mg total) by mouth 2 (two) times daily with a meal.   Zofran/Ondansetron 8mg  tablet. Take 1 tablet every 8 hours as needed for nausea/vomiting. (#1 nausea med to take, this can constipate)  Compazine/Prochlorperazine 10mg  tablet. Take 1 tablet every 6 hours as needed for nausea/vomiting. (#2 nausea med to take, this can make you sleepy)   EMLA cream. Apply a quarter size amount to port  site 1 hour prior to chemo. Do not rub in. Cover with plastic  wrap.   Over-the-Counter Meds:  Miralax 1 capful in 8 oz of fluid daily. May increase to two times a day if needed. This is a stool softener. If this doesn't work proceed you can add:  Senokot S-start with 1 tablet two times a day and increase to 4 tablets two times a day if needed. (total of 8 tablets in a 24 hour period). This is a stimulant laxative.   Call us if this does not help your bowels move.   Imodium 2mg  capsule. Take 2 capsules after the 1st loose stool and then 1 capsule every 2 hours until you go a total of 12 hours without having a loose stool. Call the [facility] if loose stools continue. If diarrhea occurs @ bedtime, take 2 capsules @ bedtime. Then take 2 capsules every 4 hours until morning. Call [facility].       Constipation Sheet *Miralax in 8 oz of fluid daily.  May increase to two times a day if needed.  This is a stool softener.  If this not enough to keep your bowel regular:  You can add:  *Senokot S, start with one tablet twice a day and can increase to 4 tablets twice a day if needed.  This is a stimulant laxative.   Sometimes when you take pain medication you need BOTH a medicine to keep your stool soft and a medicine to help your bowel push it out!  Please call if the above does not work for you.   Do not go more than 2 days without a bowel movement.  It is very important that you do not become constipated.  It will make you feel sick to your stomach (nausea) and can cause abdominal pain and vomiting.      Diarrhea Sheet  If you are having loose stools/diarrhea, please purchase Imodium and begin taking as outlined:  At the first sign of poorly formed or loose stools you should begin taking Imodium(loperamide) 2 mg capsules.  Take two caplets (4mg ) followed by one caplet (2mg ) every 2 hours until you have had no diarrhea for 12 hours.  During the night take two caplets (4mg ) at bedtime and continue every 4 hours during the night until the  morning.  Stop taking Imodium only after there is no sign of diarrhea for 12 hours.    Always call the [facility] if you are having loose stools/diarrhea that you can't get under control.  Loose stools/disrrhea leads to dehydration (loss of water) in your body.  We have other options of trying to get the loose stools/diarrhea to stopped but you must let us know!      Nausea Sheet  Zofran/Ondansetron 8mg  tablet. Take 1 tablet every 8 hours as needed for nausea/vomiting. (#1 nausea med to take, this can constipate)  Compazine/Prochlorperazine 10mg  tablet. Take 1 tablet every 6 hours as needed for nausea/vomiting. (#2 nausea med to take, this can make you sleepy)  You can take these medications together or separately.  We would first like for you to try the Ondansetron by itself and then take the Prochloperizine if needed. But you are allowed to take both medications at the same time if your nausea is that severe.  If you are having persistent nausea (nausea that does not stop) please take these medications on a staggered schedule so that the nausea medication stays in your body.  Please call the Cancer  Center and let us know the amount of nausea that you are experiencing.  If you begin to vomit, you need to call the [facility] and if it is the weekend and you have vomited more than one time and cant get it to stop-go to the Emergency Room.  Persistent nausea/vomiting can lead to dehydration (loss of fluid in your body) and will make you feel terrible.   Ice chips, sips of clear liquids, foods that are @ room temperature, crackers, and toast tend to be better tolerated.      SYMPTOMS TO REPORT AS SOON AS POSSIBLE AFTER TREATMENT:  FEVER GREATER THAN 100.5 F  CHILLS WITH OR WITHOUT FEVER  NAUSEA AND VOMITING THAT IS NOT CONTROLLED WITH YOUR NAUSEA MEDICATION  UNUSUAL SHORTNESS OF BREATH  UNUSUAL BRUISING OR BLEEDING  TENDERNESS IN MOUTH AND THROAT WITH OR WITHOUT PRESENCE OF  ULCERS  URINARY PROBLEMS  BOWEL PROBLEMS  UNUSUAL RASH    Wear comfortable clothing and clothing appropriate for easy access to any Portacath or PICC line. Let us know if there is anything that we can do to make your therapy better!      What to do if you need assistance after hours or on the weekends: CALL [phone].  HOLD on the line, do not hang up.  You will hear multiple messages but at the end you will be connected with a nurse triage line.  They will contact Dr Whitney Muse if necessary.  Most of the time they will be able to assist you.    Do not call the hospital operator.  Dr Whitney Muse will not answer phone calls received by them.      I have been informed and understand all of the instructions given to me and have received a copy. I have been instructed to call the clinic [phone]  or my family physician as soon as possible for continued medical care, if indicated. I do not have any more questions at this time but understand that I may call the [facility] at [phone] during office hours should I have questions or need assistance in obtaining follow-up care.

## 2016-01-17 ENCOUNTER — Encounter (HOSPITAL_COMMUNITY): Payer: Self-pay | Admitting: Emergency Medicine

## 2016-01-17 NOTE — Progress Notes (Signed)
Chemotherapy teaching complete.  Premeds escribed.  Labs entered.  Chemotherapy/doctors appt made.

## 2016-01-28 ENCOUNTER — Encounter (HOSPITAL_COMMUNITY): Payer: Medicare Other | Attending: Oncology

## 2016-01-28 ENCOUNTER — Encounter (HOSPITAL_COMMUNITY): Payer: Medicare Other

## 2016-01-28 ENCOUNTER — Encounter (HOSPITAL_COMMUNITY): Payer: Self-pay | Admitting: Hematology & Oncology

## 2016-01-28 ENCOUNTER — Encounter (HOSPITAL_COMMUNITY): Payer: Self-pay | Admitting: Oncology

## 2016-01-28 ENCOUNTER — Encounter (HOSPITAL_BASED_OUTPATIENT_CLINIC_OR_DEPARTMENT_OTHER): Payer: Medicare Other | Admitting: Oncology

## 2016-01-28 VITALS — BP 168/79 | HR 59 | Temp 98.2°F | Resp 18

## 2016-01-28 DIAGNOSIS — C7951 Secondary malignant neoplasm of bone: Secondary | ICD-10-CM

## 2016-01-28 DIAGNOSIS — C61 Malignant neoplasm of prostate: Secondary | ICD-10-CM | POA: Insufficient documentation

## 2016-01-28 DIAGNOSIS — Z5111 Encounter for antineoplastic chemotherapy: Secondary | ICD-10-CM | POA: Diagnosis not present

## 2016-01-28 LAB — CBC WITH DIFFERENTIAL/PLATELET
BASOS PCT: 0 %
Basophils Absolute: 0 10*3/uL (ref 0.0–0.1)
Eosinophils Absolute: 0 10*3/uL (ref 0.0–0.7)
Eosinophils Relative: 0 %
HEMATOCRIT: 31.1 % — AB (ref 39.0–52.0)
HEMOGLOBIN: 10.4 g/dL — AB (ref 13.0–17.0)
LYMPHS ABS: 0.7 10*3/uL (ref 0.7–4.0)
Lymphocytes Relative: 8 %
MCH: 31.3 pg (ref 26.0–34.0)
MCHC: 33.4 g/dL (ref 30.0–36.0)
MCV: 93.7 fL (ref 78.0–100.0)
MONO ABS: 0.2 10*3/uL (ref 0.1–1.0)
MONOS PCT: 2 %
NEUTROS ABS: 8 10*3/uL — AB (ref 1.7–7.7)
NEUTROS PCT: 90 %
Platelets: 232 10*3/uL (ref 150–400)
RBC: 3.32 MIL/uL — ABNORMAL LOW (ref 4.22–5.81)
RDW: 15.4 % (ref 11.5–15.5)
WBC: 8.8 10*3/uL (ref 4.0–10.5)

## 2016-01-28 LAB — COMPREHENSIVE METABOLIC PANEL
ALBUMIN: 3.7 g/dL (ref 3.5–5.0)
ALK PHOS: 131 U/L — AB (ref 38–126)
ALT: 18 U/L (ref 17–63)
ANION GAP: 11 (ref 5–15)
AST: 25 U/L (ref 15–41)
BILIRUBIN TOTAL: 0.6 mg/dL (ref 0.3–1.2)
BUN: 17 mg/dL (ref 6–20)
CO2: 24 mmol/L (ref 22–32)
Calcium: 9.6 mg/dL (ref 8.9–10.3)
Chloride: 105 mmol/L (ref 101–111)
Creatinine, Ser: 0.94 mg/dL (ref 0.61–1.24)
GLUCOSE: 206 mg/dL — AB (ref 65–99)
POTASSIUM: 3.9 mmol/L (ref 3.5–5.1)
Sodium: 140 mmol/L (ref 135–145)
TOTAL PROTEIN: 6.1 g/dL — AB (ref 6.5–8.1)

## 2016-01-28 LAB — PSA: PSA: 79.58 ng/mL — AB (ref 0.00–4.00)

## 2016-01-28 MED ORDER — SODIUM CHLORIDE 0.9 % IV SOLN
Freq: Once | INTRAVENOUS | Status: AC
  Administered 2016-01-28: 11:00:00 via INTRAVENOUS

## 2016-01-28 MED ORDER — DEXTROSE 5 % IV SOLN
25.0000 mg/m2 | Freq: Once | INTRAVENOUS | Status: AC
  Administered 2016-01-28: 60 mg via INTRAVENOUS
  Filled 2016-01-28: qty 6

## 2016-01-28 MED ORDER — FAMOTIDINE IN NACL 20-0.9 MG/50ML-% IV SOLN
20.0000 mg | Freq: Once | INTRAVENOUS | Status: AC
  Administered 2016-01-28: 20 mg via INTRAVENOUS
  Filled 2016-01-28: qty 50

## 2016-01-28 MED ORDER — SODIUM CHLORIDE 0.9% FLUSH: 10.0000 mL | INTRAVENOUS | Status: DC | PRN

## 2016-01-28 MED ORDER — HEPARIN SOD (PORK) LOCK FLUSH 100 UNIT/ML IV SOLN
500.0000 [IU] | Freq: Once | INTRAVENOUS | Status: AC | PRN
  Administered 2016-01-28: 500 [IU]
  Filled 2016-01-28: qty 5

## 2016-01-28 MED ORDER — DIPHENHYDRAMINE HCL 50 MG/ML IJ SOLN
25.0000 mg | Freq: Once | INTRAMUSCULAR | Status: AC
  Administered 2016-01-28: 25 mg via INTRAVENOUS
  Filled 2016-01-28: qty 1

## 2016-01-28 MED ORDER — SODIUM CHLORIDE 0.9 % IV SOLN
10.0000 mg | Freq: Once | INTRAVENOUS | Status: DC
  Filled 2016-01-28: qty 1

## 2016-01-28 MED ORDER — DEXAMETHASONE SODIUM PHOSPHATE 10 MG/ML IJ SOLN
10.0000 mg | Freq: Once | INTRAMUSCULAR | Status: AC
  Administered 2016-01-28: 10 mg via INTRAVENOUS
  Filled 2016-01-28: qty 1

## 2016-01-28 MED ORDER — OXYCODONE-ACETAMINOPHEN 10-325 MG PO TABS: 1.0000 | ORAL_TABLET | Freq: Four times a day (QID) | ORAL | 0 refills | Status: DC | PRN

## 2016-01-28 MED ORDER — PEGFILGRASTIM 6 MG/0.6ML ~~LOC~~ PSKT
6.0000 mg | PREFILLED_SYRINGE | Freq: Once | SUBCUTANEOUS | Status: AC
  Administered 2016-01-28: 6 mg via SUBCUTANEOUS
  Filled 2016-01-28: qty 0.6

## 2016-01-28 NOTE — Patient Instructions (Signed)
[facility] Discharge Instructions for Patients Receiving Chemotherapy   Beginning January 23rd 2017 lab work for the [facility] will be done in the  Main lab at [facility] on 1st floor. If you have a lab appointment with the [facility] please come in thru the  Main Entrance and check in at the main information desk   Today you received the following chemotherapy agents:  Jevtana  To help prevent nausea and vomiting after your treatment, we encourage you to take your nausea medication as prescribed. If you develop nausea and vomiting, or diarrhea that is not controlled by your medication, call the clinic.  The clinic phone number is [phone]. Office hours are Monday-Friday 8:30am-5:00pm.  BELOW ARE SYMPTOMS THAT SHOULD BE REPORTED IMMEDIATELY:  *FEVER GREATER THAN 101.0 F  *CHILLS WITH OR WITHOUT FEVER  NAUSEA AND VOMITING THAT IS NOT CONTROLLED WITH YOUR NAUSEA MEDICATION  *UNUSUAL SHORTNESS OF BREATH  *UNUSUAL BRUISING OR BLEEDING  TENDERNESS IN MOUTH AND THROAT WITH OR WITHOUT PRESENCE OF ULCERS  *URINARY PROBLEMS  *BOWEL PROBLEMS  UNUSUAL RASH Items with * indicate a potential emergency and should be followed up as soon as possible. If you have an emergency after office hours please contact your primary care physician or go to the nearest emergency department.  Please call the clinic during office hours if you have any questions or concerns.   You may also contact the Patient Navigator at [phone] should you have any questions or need assistance in obtaining follow up care.      Resources For Cancer Patients and their Caregivers ? American Cancer Society: Can assist with transportation, wigs, general needs, runs Look Good Feel Better.        2606613219 ? Cancer Care: Provides financial assistance, online support groups, medication/co-pay assistance.  1-800-813-HOPE 4386505022) ? [place] Assists  [place] cancer patients and their families through emotional , educational and financial support.  714-716-3852 ? [place] DSS Where to apply for food stamps, Medicaid and utility assistance. 650-453-8158 ? RCATS: Transportation to medical appointments. (916) 677-9675 ? Social Security Administration: May apply for disability if have a Stage IV cancer. [phone] ? [employer], Disability and Transit Services: Assists with nutrition, care and transit needs. [phone]

## 2016-01-28 NOTE — Assessment & Plan Note (Addendum)
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  Currently on Depo-Lupron monthly and systemic chemotherapy with Carbazitaxel beginning on 01/28/2016.  Previous treatments include: Casodex, Zytiga + Prednisone, and Xtandi.  Additionally, he recently complete palliative XRT to left femur.  Oncology history is updated.  Pre-treatment labs: CBC diff, CMET, PSA.  I personally reviewed and went over laboratory results with the patient.  The results are noted within this dictation.  Depo-Lupron is due ~ 2 weeks.  Return in 2 weeks for Nadir check with labs.

## 2016-01-28 NOTE — Progress Notes (Signed)
Consent signed for Christinia Gully

## 2016-01-28 NOTE — Progress Notes (Signed)
Tolerated Taxol infusion w/o adverse reaction.  Alert, in no distress.  VSS.  ONPRO neulasta on body injector in place and engaged with green light indicator on flashing. Tolerated application with out problems.  Discharged ambulatory in c/o spouse.

## 2016-01-28 NOTE — Patient Instructions (Addendum)
[place] at [facility] Discharge Instructions  RECOMMENDATIONS MADE BY THE CONSULTANT AND ANY TEST RESULTS WILL BE SENT TO YOUR REFERRING PHYSICIAN.  You were seen today for [employer] PA-C. Return in 2 weeks for follow up and Lupron injection     Thank you for choosing [place] at [facility] to provide your oncology and hematology care.  To afford each patient quality time with our provider, please arrive at least 15 minutes before your scheduled appointment time.   Beginning January 23rd 2017 lab work for the [facility] will be done in the  Main lab at [facility] on 1st floor. If you have a lab appointment with the [facility] please come in thru the  Main Entrance and check in at the main information desk  You need to re-schedule your appointment should you arrive 10 or more minutes late.  We strive to give you quality time with our providers, and arriving late affects you and other patients whose appointments are after yours.  Also, if you no show three or more times for appointments you may be dismissed from the clinic at the providers discretion.     Again, thank you for choosing [facility].  Our hope is that these requests will decrease the amount of time that you wait before being seen by our physicians.       _____________________________________________________________  Should you have questions after your visit to [facility], please contact our office at [phone] between the hours of 8:30 a.m. and 4:30 p.m.  Voicemails left after 4:30 p.m. will not be returned until the following business day.  For prescription refill requests, have your pharmacy contact our office.         Resources For Cancer Patients and their Caregivers ? American Cancer Society: Can assist with transportation, wigs, general needs, runs Look Good Feel Better.        954 824 3921 ? Cancer Care: Provides  financial assistance, online support groups, medication/co-pay assistance.  1-800-813-HOPE 705-841-8309) ? [place] Assists [place] cancer patients and their families through emotional , educational and financial support.  503-881-9362 ? [place] DSS Where to apply for food stamps, Medicaid and utility assistance. (613) 150-8488 ? RCATS: Transportation to medical appointments. (236)621-4041 ? Social Security Administration: May apply for disability if have a Stage IV cancer. 540 848 1289 (470)501-2620 ? [employer], Disability and Transit Services: Assists with nutrition, care and transit needs. [place] Support Programs: @10RELATIVEDAYS @ > Cancer Support Group  2nd Tuesday of the month 1pm-2pm, Journey Room  > Creative Journey  3rd Tuesday of the month 1130am-1pm, Journey Room  > Look Good Feel Better  1st Wednesday of the month 10am-12 noon, Journey Room (Call [facility] to register [phone])

## 2016-01-28 NOTE — Progress Notes (Signed)
Micheal Labrum, Micheal [place] [id number]  Prostate cancer metastatic to bone [facility])  CURRENT THERAPY: Carbazitaxel beginning on 01/28/2016  INTERVAL HISTORY: Micheal Davis 70 y.o. 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  Currently on Depo-Lupron monthly and systemic chemotherapy with Carbazitaxel beginning on 01/28/2016.  Previous treatments include: Casodex, Zytiga + Prednisone, and Xtandi.  Additionally, he recently complete palliative XRT to left femur.    Prostate cancer metastatic to bone ([place])   10/01/2012 Initial Diagnosis    Prostate biopsied with highest Gleason score of 9 seen and the lowest score was 7.      10/04/2012 - 05/16/2013 Chemotherapy    Depo-Lupron and Casodex initiated      05/16/2013 -  Chemotherapy    Depo-Lupron monthly continued      05/16/2013 Progression    Progression by PSA elevation      05/16/2013 - 10/22/2014 Chemotherapy    Abiraterone and prednisone initiated in conjunction with ongoing Depo-Lupron.  Denosumab also ongoing.      10/23/2014 Progression    PSA increasing from 0.2- 1.6 in less than 6 months.        10/23/2014 - 01/30/2015 Chemotherapy    Enzalutamide and Prednisone (5 mg in AM and 2.5 mg in PM)      01/30/2015 Imaging    Bone scan- New focus of intense activity in right proximal humerus.  Interim increase in activity over left hip.      01/30/2015 Progression    Bone scan reveals new disease in right humerus consistent with progression of disease      01/31/2015 Imaging    Right humerus xray- blastic foci in proximal right humeral metaphysis and over right mid-humeral diaphysis.  No evidence of fracture      07/06/2015 Progression    Progression in multiple bones especially L hip and femurs      07/06/2015 Imaging    Bone scan- progressive multifocal osseous metastases in the right  proximal femora and distal femoral shafts.  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 Tammi Klippel      11/23/2015 - 01/04/2016 Chemotherapy    The patient had pegfilgrastim (NEULASTA ONPRO KIT) injection 6 mg, 6 mg, Subcutaneous, Once, 3 of 7 cycles  DOCEtaxel (TAXOTERE) 180 mg in dextrose 5 % 250 mL chemo infusion, 75 mg/m2 = 180 mg, Intravenous,  Once, 3 of 7 cycles Dose modification: 64 mg/m2 (original dose 75 mg/m2, Cycle 2, Reason: Dose not tolerated)  pegfilgrastim (NEULASTA ONPRO KIT) injection 6 mg, 6 mg, Subcutaneous, Once, 0 of 4 cycles  cabazitaxel (JEVTANA) 60 mg in dextrose 5 % 250 mL chemo infusion, 25 mg/m2, Intravenous,  Once, 0 of 4 cycles  for chemotherapy treatment.        11/30/2015 Adverse Reaction    Diarrhea (secondary to chemotherapy) and dehydration requiring IV fluids      12/14/2015 Treatment Plan Change    Docetaxel dose reduced by 15%      01/28/2016 -  Chemotherapy    The patient had pegfilgrastim (NEULASTA ONPRO KIT) injection 6 mg, 6 mg, Subcutaneous, Once, 3 of 7 cycles  DOCEtaxel (TAXOTERE) 180 mg in dextrose 5 % 250 mL chemo infusion, 75 mg/m2 = 180  mg, Intravenous,  Once, 3 of 7 cycles Dose modification: 64 mg/m2 (original dose 75 mg/m2, Cycle 2, Reason: Dose not tolerated)  pegfilgrastim (NEULASTA ONPRO KIT) injection 6 mg, 6 mg, Subcutaneous, Once, 0 of 4 cycles  cabazitaxel (JEVTANA) 60 mg in dextrose 5 % 250 mL chemo infusion, 25 mg/m2, Intravenous,  Once, 0 of 4 cycles  for chemotherapy treatment.         He is here to start his first cycle of Jevtana.  He has no questions.  We reviewed a few of the more common side effects of treatment.    Review of Systems  Constitutional: Negative.  Negative for chills and fever.  HENT: Negative.   Eyes: Negative.   Respiratory: Negative.  Negative for cough.   Cardiovascular:  Negative.  Negative for chest pain and palpitations.  Gastrointestinal: Negative.  Negative for nausea and vomiting.  Genitourinary: Negative.   Musculoskeletal: Negative.   Skin: Negative.   Neurological: Negative.   Endo/Heme/Allergies: Negative.   Psychiatric/Behavioral: Negative.     Past Medical History:  Diagnosis Date  . Diabetes mellitus without complication ([place])   . Hypertension   . Prostate cancer ([place]) 09/05/2015  . Prostate cancer metastatic to bone ([place]) 09/05/2015  . Sleep apnea   . Thyroid disease     Past Surgical History:  Procedure Laterality Date  . CATARACT EXTRACTION    . PORTACATH PLACEMENT Left 12/31/2015   Procedure: INSERTION PORT-A-CATH LEFT SUBCLAVIAN;  Surgeon: Aviva Signs, Micheal Davis;  Location: [facility];  Service: General;  Laterality: Left;  . REPLACEMENT TOTAL KNEE Left     History reviewed. No pertinent family history.  Social History   Social History  . Marital status: Married    Spouse name: N/A  . Number of children: N/A  . Years of education: N/A   Social History Main Topics  . Smoking status: Never Smoker  . Smokeless tobacco: Never Used  . Alcohol use Yes     Comment: 1 beer each month  . Drug use: No  . Sexual activity: No     Comment: married   Other Topics Concern  . None   Social History Narrative  . None     PHYSICAL EXAMINATION  ECOG PERFORMANCE STATUS: 1 - Symptomatic but completely ambulatory  Vitals:   01/28/16 0920  BP: (!) 166/81  Pulse: 70  Resp: 16  Temp: 98 F (36.7 C)    GENERAL:alert, no distress, well nourished, well developed, comfortable, cooperative, obese, smiling and accompanied by wife, in chemo-recliner. SKIN: skin color, texture, turgor are normal, no rashes or significant lesions HEAD: Normocephalic, No masses, lesions, tenderness or abnormalities EYES: normal, EOMI, Conjunctiva are pink and non-injected EARS: External ears normal OROPHARYNX:lips, buccal mucosa, and tongue normal and mucous  membranes are moist  NECK: supple, no adenopathy, trachea midline LYMPH:  no palpable lymphadenopathy BREAST:not examined LUNGS: clear to auscultation and percussion HEART: regular rate & rhythm, no murmurs, no gallops, S1 normal and S2 normal ABDOMEN:abdomen soft, obese and normal bowel sounds BACK: No CVA tenderness EXTREMITIES:less then 2 second capillary refill, no joint deformities, effusion, or inflammation, no skin discoloration, no cyanosis  NEURO: alert & oriented x 3 with fluent speech, no focal motor/sensory deficits, gait normal   LABORATORY DATA: CBC    Component Value Date/Time   WBC 8.8 01/28/2016 0929   RBC 3.32 (L) 01/28/2016 0929   HGB 10.4 (L) 01/28/2016 0929   HCT 31.1 (L) 01/28/2016 0929   PLT 232 01/28/2016 0929  MCV 93.7 01/28/2016 0929   MCH 31.3 01/28/2016 0929   MCHC 33.4 01/28/2016 0929   RDW 15.4 01/28/2016 0929   LYMPHSABS 0.7 01/28/2016 0929   MONOABS 0.2 01/28/2016 0929   EOSABS 0.0 01/28/2016 0929   BASOSABS 0.0 01/28/2016 0929      Chemistry      Component Value Date/Time   NA 140 01/28/2016 0929   K 3.9 01/28/2016 0929   CL 105 01/28/2016 0929   CO2 24 01/28/2016 0929   BUN 17 01/28/2016 0929   CREATININE 0.94 01/28/2016 0929      Component Value Date/Time   CALCIUM 9.6 01/28/2016 0929   ALKPHOS 131 (H) 01/28/2016 0929   AST 25 01/28/2016 0929   ALT 18 01/28/2016 0929   BILITOT 0.6 01/28/2016 0929     Lab Results  Component Value Date   PSA 84.08 (H) 01/04/2016   PSA 69.82 (H) 12/14/2015   PSA 49.48 (H) 11/23/2015     PENDING LABS:   RADIOGRAPHIC STUDIES:  Dg Chest Port 1 View  Result Date: 12/31/2015 CLINICAL DATA:  Porta Cather placement. EXAM: PORTABLE CHEST 1 VIEW COMPARISON:  09/27/10 FINDINGS: Chronic cardiomegaly. PICC on the right and porta catheter on the left with tips at the SVC level. No pneumothorax. Low volume chest with vascular pedicle widening. No edema, effusion, or consolidation. Sclerotic bone  metastases. IMPRESSION: No acute finding after porta catheter placement. Electronically Signed   By: Monte Fantasia M.D.   On: 12/31/2015 09:44   Dg C-arm 1-60 Min-no Report  Result Date: 12/31/2015 CLINICAL DATA: portacath C-ARM 1-60 MINUTES Fluoroscopy was utilized by the requesting physician.  No radiographic interpretation.     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  Currently on Depo-Lupron monthly and systemic chemotherapy with Carbazitaxel beginning on 01/28/2016.  Previous treatments include: Casodex, Zytiga + Prednisone, and Xtandi.  Additionally, he recently complete palliative XRT to left femur.  Oncology history is updated.  Pre-treatment labs: CBC diff, CMET, PSA.  I personally reviewed and went over laboratory results with the patient.  The results are noted within this dictation.  Depo-Lupron is due ~ 2 weeks.  Return in 2 weeks for Nadir check with labs.   ORDERS PLACED FOR THIS ENCOUNTER: No orders of the defined types were placed in this encounter.   MEDICATIONS PRESCRIBED THIS ENCOUNTER: Meds ordered this encounter  Medications  . dexamethasone (DECADRON) 4 MG tablet    THERAPY PLAN:  Continue palliative chemotherapy as outlined above.  All questions were answered. The patient knows to call the clinic with any problems, questions or concerns. We can certainly see the patient much sooner if necessary.  Patient and plan discussed with Dr. Ancil Linsey and she is in agreement with the aforementioned.   This note is electronically signed by: Doy Mince 01/28/2016 11:43 AM

## 2016-01-29 ENCOUNTER — Telehealth (HOSPITAL_COMMUNITY): Payer: Self-pay | Admitting: *Deleted

## 2016-01-29 ENCOUNTER — Other Ambulatory Visit (HOSPITAL_COMMUNITY): Payer: Self-pay | Admitting: Pharmacist

## 2016-01-29 NOTE — Telephone Encounter (Signed)
Contacted for 24 hr f/u post first Jevtana infusion.  Reports feeling "a little tired", but denies any other s/s.  Instructed to call the clinic with any questions or concerns.

## 2016-02-10 NOTE — Progress Notes (Signed)
Micheal Labrum, MD [place] [address]  Prostate cancer metastatic to bone [facility])  CURRENT THERAPY: Carbazitaxel beginning on 01/28/2016  INTERVAL HISTORY: Micheal Davis 70 y.o. 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  Currently on Depo-Lupron monthly and systemic chemotherapy with Carbazitaxel beginning on 01/28/2016.  Previous treatments include: Casodex, Zytiga + Prednisone, and Xtandi.  Additionally, he recently completed palliative XRT to left femur.    Prostate cancer metastatic to bone ([place])   10/01/2012 Initial Diagnosis    Prostate biopsied with highest Gleason score of 9 seen and the lowest score was 7.      10/04/2012 - 05/16/2013 Chemotherapy    Depo-Lupron and Casodex initiated      05/16/2013 -  Chemotherapy    Depo-Lupron monthly continued      05/16/2013 Progression    Progression by PSA elevation      05/16/2013 - 10/22/2014 Chemotherapy    Abiraterone and prednisone initiated in conjunction with ongoing Depo-Lupron.  Denosumab also ongoing.      10/23/2014 Progression    PSA increasing from 0.2- 1.6 in less than 6 months.        10/23/2014 - 01/30/2015 Chemotherapy    Enzalutamide and Prednisone (5 mg in AM and 2.5 mg in PM)      01/30/2015 Imaging    Bone scan- New focus of intense activity in right proximal humerus.  Interim increase in activity over left hip.      01/30/2015 Progression    Bone scan reveals new disease in right humerus consistent with progression of disease      01/31/2015 Imaging    Right humerus xray- blastic foci in proximal right humeral metaphysis and over right mid-humeral diaphysis.  No evidence of fracture      07/06/2015 Progression    Progression in multiple bones especially L hip and femurs      07/06/2015 Imaging    Bone scan- progressive multifocal osseous metastases in the right  proximal femora and distal femoral shafts.  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 Tammi Klippel      11/23/2015 - 01/04/2016 Chemotherapy    The patient had pegfilgrastim (NEULASTA ONPRO KIT) injection 6 mg, 6 mg, Subcutaneous, Once, 3 of 7 cycles  DOCEtaxel (TAXOTERE) 180 mg in dextrose 5 % 250 mL chemo infusion, 75 mg/m2 = 180 mg, Intravenous,  Once, 3 of 7 cycles Dose modification: 64 mg/m2 (original dose 75 mg/m2, Cycle 2, Reason: Dose not tolerated)  pegfilgrastim (NEULASTA ONPRO KIT) injection 6 mg, 6 mg, Subcutaneous, Once, 0 of 4 cycles  cabazitaxel (JEVTANA) 60 mg in dextrose 5 % 250 mL chemo infusion, 25 mg/m2, Intravenous,  Once, 0 of 4 cycles  for chemotherapy treatment.        11/30/2015 Adverse Reaction    Diarrhea (secondary to chemotherapy) and dehydration requiring IV fluids      12/14/2015 Treatment Plan Change    Docetaxel dose reduced by 15%      12/31/2015 Procedure    Port placed by Dr. Arnoldo Morale      01/28/2016 -  Chemotherapy    Cabazitaxel Christinia Gully)        He is here today for NADIR check.  He tolerated his  first treatment well.  He notes one instance of eating greasy food that led to loose stools and nausea.  He used his home antiemetics which resolved the nausea.  He denies any emesis.  Review of Systems  Constitutional: Negative.  Negative for chills and fever.  HENT: Negative.   Eyes: Negative.   Respiratory: Negative.  Negative for cough.   Cardiovascular: Negative.  Negative for chest pain and palpitations.  Gastrointestinal: Negative.  Negative for nausea and vomiting.  Genitourinary: Negative.   Musculoskeletal: Negative.   Skin: Negative.   Neurological: Negative.   Endo/Heme/Allergies: Negative.   Psychiatric/Behavioral: Negative.     Past Medical History:  Diagnosis Date  . Diabetes mellitus without complication ([place])    . Hypertension   . Prostate cancer ([place]) 09/05/2015  . Prostate cancer metastatic to bone ([place]) 09/05/2015  . Sleep apnea   . Thyroid disease     Past Surgical History:  Procedure Laterality Date  . CATARACT EXTRACTION    . PORTACATH PLACEMENT Left 12/31/2015   Procedure: INSERTION PORT-A-CATH LEFT SUBCLAVIAN;  Surgeon: Aviva Signs, MD;  Location: [facility];  Service: General;  Laterality: Left;  . REPLACEMENT TOTAL KNEE Left     History reviewed. No pertinent family history.  Social History   Social History  . Marital status: Married    Spouse name: N/A  . Number of children: N/A  . Years of education: N/A   Social History Main Topics  . Smoking status: Never Smoker  . Smokeless tobacco: Never Used  . Alcohol use Yes     Comment: 1 beer each month  . Drug use: No  . Sexual activity: No     Comment: married   Other Topics Concern  . None   Social History Narrative  . None     PHYSICAL EXAMINATION  ECOG PERFORMANCE STATUS: 1 - Symptomatic but completely ambulatory  Vitals:   02/11/16 0905  BP: (!) 158/78  Pulse: 65  Resp: 18  Temp: 97.7 F (36.5 C)    GENERAL:alert, no distress, well nourished, well developed, comfortable, cooperative, obese, smiling and unaccompanied. SKIN: skin color, texture, turgor are normal, no rashes or significant lesions HEAD: Normocephalic, No masses, lesions, tenderness or abnormalities EYES: normal, EOMI, Conjunctiva are pink and non-injected EARS: External ears normal OROPHARYNX:lips, buccal mucosa, and tongue normal and mucous membranes are moist  NECK: supple, no adenopathy, trachea midline LYMPH:  no palpable lymphadenopathy BREAST:not examined LUNGS: clear to auscultation and percussion HEART: regular rate & rhythm, no murmurs, no gallops, S1 normal and S2 normal ABDOMEN:abdomen soft, obese and normal bowel sounds BACK: No CVA tenderness EXTREMITIES:less then 2 second capillary refill, no joint deformities, effusion, or  inflammation, no skin discoloration, no cyanosis  NEURO: alert & oriented x 3 with fluent speech, no focal motor/sensory deficits, gait normal   LABORATORY DATA: CBC    Component Value Date/Time   WBC 8.8 01/28/2016 0929   RBC 3.32 (L) 01/28/2016 0929   HGB 10.4 (L) 01/28/2016 0929   HCT 31.1 (L) 01/28/2016 0929   PLT 232 01/28/2016 0929   MCV 93.7 01/28/2016 0929   MCH 31.3 01/28/2016 0929   MCHC 33.4 01/28/2016 0929   RDW 15.4 01/28/2016 0929   LYMPHSABS 0.7 01/28/2016 0929   MONOABS 0.2 01/28/2016 0929   EOSABS 0.0 01/28/2016 0929   BASOSABS 0.0 01/28/2016 0929      Chemistry      Component Value Date/Time   NA 140 01/28/2016 0929   K 3.9 01/28/2016  0929   CL 105 01/28/2016 0929   CO2 24 01/28/2016 0929   BUN 17 01/28/2016 0929   CREATININE 0.94 01/28/2016 0929      Component Value Date/Time   CALCIUM 9.6 01/28/2016 0929   ALKPHOS 131 (H) 01/28/2016 0929   AST 25 01/28/2016 0929   ALT 18 01/28/2016 0929   BILITOT 0.6 01/28/2016 0929     Lab Results  Component Value Date   PSA 79.58 (H) 01/28/2016   PSA 84.08 (H) 01/04/2016   PSA 69.82 (H) 12/14/2015     PENDING LABS:   RADIOGRAPHIC STUDIES:  No results found.   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  Currently on Depo-Lupron monthly and systemic chemotherapy with Carbazitaxel beginning on 01/28/2016.  Previous treatments include: Casodex, Zytiga + Prednisone, and Xtandi.  Additionally, he recently complete palliative XRT to left femur.  Oncology history is updated.  Labs today: CBC diff, CMET, PSA.  I personally reviewed and went over laboratory results with the patient.  The results are noted within this dictation.  Depo-Lupron is due today.  Return as scheduled for follow-up and cycle #2 of chemotherapy.   ORDERS PLACED FOR  THIS ENCOUNTER: No orders of the defined types were placed in this encounter.   MEDICATIONS PRESCRIBED THIS ENCOUNTER: No orders of the defined types were placed in this encounter.   THERAPY PLAN:  Continue palliative chemotherapy as outlined above.  All questions were answered. The patient knows to call the clinic with any problems, questions or concerns. We can certainly see the patient much sooner if necessary.  Patient and plan discussed with Dr. Ancil Linsey and she is in agreement with the aforementioned.   This note is electronically signed by: Robynn Pane, PA-C 02/11/2016 9:15 AM

## 2016-02-10 NOTE — Assessment & Plan Note (Addendum)
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  Currently on Depo-Lupron monthly and systemic chemotherapy with Carbazitaxel beginning on 01/28/2016.  Previous treatments include: Casodex, Zytiga + Prednisone, and Xtandi.  Additionally, he recently complete palliative XRT to left femur.  Oncology history is updated.  No labs today.  Labs next week: CBC diff, CMET, PSA.   Depo-Lupron is due today.  Return as scheduled for follow-up and cycle #2 of chemotherapy.

## 2016-02-11 ENCOUNTER — Encounter (HOSPITAL_COMMUNITY): Payer: Medicare Other | Attending: Hematology & Oncology

## 2016-02-11 ENCOUNTER — Encounter (HOSPITAL_COMMUNITY): Payer: Self-pay | Admitting: Oncology

## 2016-02-11 ENCOUNTER — Encounter (HOSPITAL_BASED_OUTPATIENT_CLINIC_OR_DEPARTMENT_OTHER): Payer: Medicare Other | Admitting: Oncology

## 2016-02-11 DIAGNOSIS — C7951 Secondary malignant neoplasm of bone: Secondary | ICD-10-CM

## 2016-02-11 DIAGNOSIS — C61 Malignant neoplasm of prostate: Secondary | ICD-10-CM

## 2016-02-11 DIAGNOSIS — Z5111 Encounter for antineoplastic chemotherapy: Secondary | ICD-10-CM | POA: Diagnosis present

## 2016-02-11 MED ORDER — LEUPROLIDE ACETATE 7.5 MG IM KIT
7.5000 mg | PACK | INTRAMUSCULAR | Status: DC
  Administered 2016-02-11: 7.5 mg via INTRAMUSCULAR
  Filled 2016-02-11: qty 7.5

## 2016-02-11 NOTE — Patient Instructions (Addendum)
[place] at [facility] Discharge Instructions  RECOMMENDATIONS MADE BY THE CONSULTANT AND ANY TEST RESULTS WILL BE SENT TO YOUR REFERRING PHYSICIAN.  You saw Kirby Crigler, PA-C, today. Return next week as scheduled MD visit and treatment. See Amy at checkout for appointments.  Thank you for choosing [place] at [facility] to provide your oncology and hematology care.  To afford each patient quality time with our provider, please arrive at least 15 minutes before your scheduled appointment time.   Beginning January 23rd 2017 lab work for the [facility] will be done in the  Main lab at [facility] on 1st floor. If you have a lab appointment with the [facility] please come in thru the  Main Entrance and check in at the main information desk  You need to re-schedule your appointment should you arrive 10 or more minutes late.  We strive to give you quality time with our providers, and arriving late affects you and other patients whose appointments are after yours.  Also, if you no show three or more times for appointments you may be dismissed from the clinic at the providers discretion.     Again, thank you for choosing [facility].  Our hope is that these requests will decrease the amount of time that you wait before being seen by our physicians.       _____________________________________________________________  Should you have questions after your visit to [facility], please contact our office at [phone] between the hours of 8:30 a.m. and 4:30 p.m.  Voicemails left after 4:30 p.m. will not be returned until the following business day.  For prescription refill requests, have your pharmacy contact our office.         Resources For Cancer Patients and their Caregivers ? American Cancer Society: Can assist with transportation, wigs, general needs, runs Look Good Feel Better.         (719)252-6491 ? Cancer Care: Provides financial assistance, online support groups, medication/co-pay assistance.  1-800-813-HOPE 386-603-8236) ? [place] Assists [place] cancer patients and their families through emotional , educational and financial support.  (701)608-8932 ? [place] DSS Where to apply for food stamps, Medicaid and utility assistance. (838)724-4419 ? RCATS: Transportation to medical appointments. (484) 317-2302 ? Social Security Administration: May apply for disability if have a Stage IV cancer. 513-201-6857 867-860-8548 ? [employer], Disability and Transit Services: Assists with nutrition, care and transit needs. [place] Support Programs: @10RELATIVEDAYS @ > Cancer Support Group  2nd Tuesday of the month 1pm-2pm, Journey Room  > Creative Journey  3rd Tuesday of the month 1130am-1pm, Journey Room  > Look Good Feel Better  1st Wednesday of the month 10am-12 noon, Journey Room (Call [place] to register [phone])

## 2016-02-11 NOTE — Progress Notes (Signed)
Pt given Lupron injection in left hip. Pt tolerated well. Pt stable and discharged home ambulatory.

## 2016-02-11 NOTE — Patient Instructions (Signed)
[place] at [facility] Discharge Instructions  RECOMMENDATIONS MADE BY THE CONSULTANT AND ANY TEST RESULTS WILL BE SENT TO YOUR REFERRING PHYSICIAN.  You were given a Lupron injection today. Return as scheduled.   Thank you for choosing [place] at [facility] to provide your oncology and hematology care.  To afford each patient quality time with our provider, please arrive at least 15 minutes before your scheduled appointment time.   Beginning January 23rd 2017 lab work for the [facility] will be done in the  Main lab at [facility] on 1st floor. If you have a lab appointment with the [facility] please come in thru the  Main Entrance and check in at the main information desk  You need to re-schedule your appointment should you arrive 10 or more minutes late.  We strive to give you quality time with our providers, and arriving late affects you and other patients whose appointments are after yours.  Also, if you no show three or more times for appointments you may be dismissed from the clinic at the providers discretion.     Again, thank you for choosing [facility].  Our hope is that these requests will decrease the amount of time that you wait before being seen by our physicians.       _____________________________________________________________  Should you have questions after your visit to [facility], please contact our office at [phone] between the hours of 8:30 a.m. and 4:30 p.m.  Voicemails left after 4:30 p.m. will not be returned until the following business day.  For prescription refill requests, have your pharmacy contact our office.         Resources For Cancer Patients and their Caregivers ? American Cancer Society: Can assist with transportation, wigs, general needs, runs Look Good Feel Better.        ([phone] ? Cancer Care: Provides financial assistance, online support  groups, medication/co-pay assistance.  1-800-813-HOPE 603-844-9889) ? [place] Assists [place] cancer patients and their families through emotional , educational and financial support.  (917)541-0553 ? [place] DSS Where to apply for food stamps, Medicaid and utility assistance. (650)473-6259 ? RCATS: Transportation to medical appointments. (518)435-6153 ? Social Security Administration: May apply for disability if have a Stage IV cancer. 856-565-0534 786-755-0789 ? [employer], Disability and Transit Services: Assists with nutrition, care and transit needs. [place] Support Programs: @10RELATIVEDAYS @ > Cancer Support Group  2nd Tuesday of the month 1pm-2pm, Journey Room  > Creative Journey  3rd Tuesday of the month 1130am-1pm, Journey Room  > Look Good Feel Better  1st Wednesday of the month 10am-12 noon, Journey Room (Call [place] to register [phone])

## 2016-02-18 ENCOUNTER — Encounter (HOSPITAL_BASED_OUTPATIENT_CLINIC_OR_DEPARTMENT_OTHER): Payer: Medicare Other

## 2016-02-18 ENCOUNTER — Encounter (HOSPITAL_BASED_OUTPATIENT_CLINIC_OR_DEPARTMENT_OTHER): Payer: Medicare Other | Admitting: Hematology & Oncology

## 2016-02-18 ENCOUNTER — Encounter (HOSPITAL_COMMUNITY): Payer: Self-pay | Admitting: Hematology & Oncology

## 2016-02-18 VITALS — BP 132/70 | HR 66 | Temp 98.0°F | Resp 18 | Wt 258.1 lb

## 2016-02-18 VITALS — BP 146/77 | HR 64 | Temp 97.6°F | Resp 18

## 2016-02-18 DIAGNOSIS — R197 Diarrhea, unspecified: Secondary | ICD-10-CM

## 2016-02-18 DIAGNOSIS — G893 Neoplasm related pain (acute) (chronic): Secondary | ICD-10-CM

## 2016-02-18 DIAGNOSIS — Z5111 Encounter for antineoplastic chemotherapy: Secondary | ICD-10-CM

## 2016-02-18 DIAGNOSIS — C61 Malignant neoplasm of prostate: Secondary | ICD-10-CM | POA: Diagnosis not present

## 2016-02-18 DIAGNOSIS — C7951 Secondary malignant neoplasm of bone: Secondary | ICD-10-CM

## 2016-02-18 DIAGNOSIS — T451X5A Adverse effect of antineoplastic and immunosuppressive drugs, initial encounter: Secondary | ICD-10-CM

## 2016-02-18 DIAGNOSIS — K521 Toxic gastroenteritis and colitis: Secondary | ICD-10-CM

## 2016-02-18 LAB — COMPREHENSIVE METABOLIC PANEL
ALT: 20 U/L (ref 17–63)
AST: 22 U/L (ref 15–41)
Albumin: 3.8 g/dL (ref 3.5–5.0)
Alkaline Phosphatase: 119 U/L (ref 38–126)
Anion gap: 10 (ref 5–15)
BILIRUBIN TOTAL: 0.9 mg/dL (ref 0.3–1.2)
BUN: 18 mg/dL (ref 6–20)
CHLORIDE: 103 mmol/L (ref 101–111)
CO2: 25 mmol/L (ref 22–32)
CREATININE: 0.9 mg/dL (ref 0.61–1.24)
Calcium: 9.6 mg/dL (ref 8.9–10.3)
GFR calc Af Amer: >60 mL/min (ref >60)
Glucose, Bld: 171 mg/dL — ABNORMAL HIGH (ref 65–99)
Potassium: 3.9 mmol/L (ref 3.5–5.1)
Sodium: 138 mmol/L (ref 135–145)
TOTAL PROTEIN: 6.2 g/dL — AB (ref 6.5–8.1)

## 2016-02-18 LAB — CBC WITH DIFFERENTIAL/PLATELET
BASOS ABS: 0 10*3/uL (ref 0.0–0.1)
Basophils Relative: 0 %
Eosinophils Absolute: 0.1 10*3/uL (ref 0.0–0.7)
Eosinophils Relative: 1 %
HEMATOCRIT: 33.2 % — AB (ref 39.0–52.0)
Hemoglobin: 11 g/dL — ABNORMAL LOW (ref 13.0–17.0)
LYMPHS PCT: 12 %
Lymphs Abs: 0.9 10*3/uL (ref 0.7–4.0)
MCH: 31.6 pg (ref 26.0–34.0)
MCHC: 33.1 g/dL (ref 30.0–36.0)
MCV: 95.4 fL (ref 78.0–100.0)
Monocytes Absolute: 0.4 10*3/uL (ref 0.1–1.0)
Monocytes Relative: 6 %
NEUTROS ABS: 6.2 10*3/uL (ref 1.7–7.7)
Neutrophils Relative %: 81 %
Platelets: 193 10*3/uL (ref 150–400)
RBC: 3.48 MIL/uL — AB (ref 4.22–5.81)
RDW: 15.8 % — ABNORMAL HIGH (ref 11.5–15.5)
WBC: 7.6 10*3/uL (ref 4.0–10.5)

## 2016-02-18 LAB — PSA: PSA: 88.36 ng/mL — AB (ref 0.00–4.00)

## 2016-02-18 MED ORDER — DIPHENHYDRAMINE HCL 50 MG/ML IJ SOLN
25.0000 mg | Freq: Once | INTRAMUSCULAR | Status: AC
  Administered 2016-02-18: 25 mg via INTRAVENOUS

## 2016-02-18 MED ORDER — PEGFILGRASTIM 6 MG/0.6ML ~~LOC~~ PSKT
PREFILLED_SYRINGE | SUBCUTANEOUS | Status: AC
  Filled 2016-02-18: qty 0.6

## 2016-02-18 MED ORDER — DEXAMETHASONE SODIUM PHOSPHATE 10 MG/ML IJ SOLN
10.0000 mg | Freq: Once | INTRAMUSCULAR | Status: AC
  Administered 2016-02-18: 10 mg via INTRAVENOUS

## 2016-02-18 MED ORDER — PEGFILGRASTIM 6 MG/0.6ML ~~LOC~~ PSKT
6.0000 mg | PREFILLED_SYRINGE | Freq: Once | SUBCUTANEOUS | Status: AC
  Administered 2016-02-18: 6 mg via SUBCUTANEOUS

## 2016-02-18 MED ORDER — DEXTROSE 5 % IV SOLN
20.0000 mg/m2 | Freq: Once | INTRAVENOUS | Status: AC
  Administered 2016-02-18: 48 mg via INTRAVENOUS
  Filled 2016-02-18: qty 4.8

## 2016-02-18 MED ORDER — HEPARIN SOD (PORK) LOCK FLUSH 100 UNIT/ML IV SOLN
500.0000 [IU] | Freq: Once | INTRAVENOUS | Status: AC | PRN
  Administered 2016-02-18: 500 [IU]
  Filled 2016-02-18: qty 5

## 2016-02-18 MED ORDER — FAMOTIDINE IN NACL 20-0.9 MG/50ML-% IV SOLN
INTRAVENOUS | Status: AC
  Filled 2016-02-18: qty 50

## 2016-02-18 MED ORDER — SODIUM CHLORIDE 0.9 % IV SOLN: 10.0000 mg | Freq: Once | INTRAVENOUS | Status: DC

## 2016-02-18 MED ORDER — SODIUM CHLORIDE 0.9 % IV SOLN
Freq: Once | INTRAVENOUS | Status: AC
  Administered 2016-02-18: 11:00:00 via INTRAVENOUS

## 2016-02-18 MED ORDER — SODIUM CHLORIDE 0.9% FLUSH: 10.0000 mL | INTRAVENOUS | Status: DC | PRN

## 2016-02-18 MED ORDER — DIPHENHYDRAMINE HCL 50 MG/ML IJ SOLN
INTRAMUSCULAR | Status: AC
  Filled 2016-02-18: qty 1

## 2016-02-18 MED ORDER — FAMOTIDINE IN NACL 20-0.9 MG/50ML-% IV SOLN
20.0000 mg | Freq: Once | INTRAVENOUS | Status: AC
  Administered 2016-02-18: 20 mg via INTRAVENOUS

## 2016-02-18 MED ORDER — DEXAMETHASONE SODIUM PHOSPHATE 10 MG/ML IJ SOLN
INTRAMUSCULAR | Status: AC
  Filled 2016-02-18: qty 1

## 2016-02-18 NOTE — Progress Notes (Signed)
During pre-treatment assessment patient reports numbness and tingling to the bottom of both of his feet.  He is able to walk and it is not causing him any issues with ambulating or any discomfort, but he was educated that this can be caused by his treatment and that he needs to be sure to keep [place] updated on this symptom.  He and his wife verbalized understanding.  Neulasta applied to right upper arm, green light flashing, patient and wife educated on when it is to be removed.  Patient tolerated infusion well.  VSS.  Patient ambulatory and stable upon discharge from clinic.

## 2016-02-18 NOTE — Patient Instructions (Signed)
[facility] Discharge Instructions for Patients Receiving Chemotherapy   Beginning January 23rd 2017 lab work for the Cancer Center will be done in the  Main lab at [facility] on 1st floor. If you have a lab appointment with the Cancer Center please come in thru the  Main Entrance and check in at the main information desk   Today you received the following chemotherapy agent: Jevtana.     If you develop nausea and vomiting, or diarrhea that is not controlled by your medication, call the clinic.  The clinic phone number is [phone]. Office hours are Monday-Friday 8:30am-5:00pm.  BELOW ARE SYMPTOMS THAT SHOULD BE REPORTED IMMEDIATELY:  *FEVER GREATER THAN 101.0 F  *CHILLS WITH OR WITHOUT FEVER  NAUSEA AND VOMITING THAT IS NOT CONTROLLED WITH YOUR NAUSEA MEDICATION  *UNUSUAL SHORTNESS OF BREATH  *UNUSUAL BRUISING OR BLEEDING  TENDERNESS IN MOUTH AND THROAT WITH OR WITHOUT PRESENCE OF ULCERS  *URINARY PROBLEMS  *BOWEL PROBLEMS  UNUSUAL RASH Items with * indicate a potential emergency and should be followed up as soon as possible. If you have an emergency after office hours please contact your primary care physician or go to the nearest emergency department.  Please call the clinic during office hours if you have any questions or concerns.   You may also contact the Patient Navigator at [phone] should you have any questions or need assistance in obtaining follow up care.      Resources For Cancer Patients and their Caregivers ? American Cancer Society: Can assist with transportation, wigs, general needs, runs Look Good Feel Better.        1-888-227-6333 ? Cancer Care: Provides financial assistance, online support groups, medication/co-pay assistance.  1-800-813-HOPE (4673) ? [facility] Assists [place] cancer patients and their families through emotional , educational and financial support.   336-427-4357 ? [place] DSS Where to apply for food stamps, Medicaid and utility assistance. 336-342-1394 ? RCATS: Transportation to medical appointments. 336-347-2287 ? Social Security Administration: May apply for disability if have a Stage IV cancer. 336-342-7796 1-800-772-1213 ? [place] Aging, Disability and Transit Services: Assists with nutrition, care and transit needs. 336-349-2343          
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
## 2016-02-18 NOTE — Patient Instructions (Addendum)
[place] at [facility] Discharge Instructions  RECOMMENDATIONS MADE BY THE CONSULTANT AND ANY TEST RESULTS WILL BE SENT TO YOUR REFERRING PHYSICIAN.  You saw Dr.Penland today. Follow up in 3 weeks with MD, labs and chemo. See Amy at checkout for appointments.  Thank you for choosing [place] at [facility] to provide your oncology and hematology care.  To afford each patient quality time with our provider, please arrive at least 15 minutes before your scheduled appointment time.   Beginning January 23rd 2017 lab work for the [facility] will be done in the  Main lab at [facility] on 1st floor. If you have a lab appointment with the [facility] please come in thru the  Main Entrance and check in at the main information desk  You need to re-schedule your appointment should you arrive 10 or more minutes late.  We strive to give you quality time with our providers, and arriving late affects you and other patients whose appointments are after yours.  Also, if you no show three or more times for appointments you may be dismissed from the clinic at the providers discretion.     Again, thank you for choosing [facility].  Our hope is that these requests will decrease the amount of time that you wait before being seen by our physicians.       _____________________________________________________________  Should you have questions after your visit to [facility], please contact our office at [phone] between the hours of 8:30 a.m. and 4:30 p.m.  Voicemails left after 4:30 p.m. will not be returned until the following business day.  For prescription refill requests, have your pharmacy contact our office.         Resources For Cancer Patients and their Caregivers ? American Cancer Society: Can assist with transportation, wigs, general needs, runs Look Good Feel Better.        ([phone] 675-2232 ? Cancer  Care: Provides financial assistance, online support groups, medication/co-pay assistance.  1-800-813-HOPE (646) 399-0875) ? [place] Assists [place] cancer patients and their families through emotional , educational and financial support.  2044964214 ? [place] DSS Where to apply for food stamps, Medicaid and utility assistance. (507)583-6897 ? RCATS: Transportation to medical appointments. 440-111-6440 ? Social Security Administration: May apply for disability if have a Stage IV cancer. 570 240 0715 (562) 283-2186 ? [employer], Disability and Transit Services: Assists with nutrition, care and transit needs. [facility] Support Programs: @10RELATIVEDAYS @ > Cancer Support Group  2nd Tuesday of the month 1pm-2pm, Journey Room  > Creative Journey  3rd Tuesday of the month 1130am-1pm, Journey Room  > Look Good Feel Better  1st Wednesday of the month 10am-12 noon, Journey Room (Call [employer] to register [phone])   Many cancer patients experience diarrhea while they are undergoing chemotherapy treatment. Diarrhea is a liquid-like loose stool or an increase in the number of bowel movements you usually have.  The severity of diarrhea is determined by the number of bowel movements experienced per day above baseline. Baseline is the number and quality of bowel movements that you have under normal circumstances. However, individual baseline activity may vary depending on your condition. For example, you may normally have multiple bowel movements per day if you have had abdominal or pelvic surgery, have cancer that has spread to the abdominal or pelvic area, or have other gastrointestinal conditions. And your movements may be  soft, semi formed or even liquid. For this reason, an increase to six loose stools per day that is considered moderate in severity for one patient may be only mild for another.  INSTRUCTIONS: Take  imodium first thing when you wake up in the morning!!  At the first sign of poorly formed or loose stools you should take additional imodium (loperamide).  Take 2 caplets (4 mg) followed by one caplet (2 mg) every two hours until you have had no diarrhea for 12 hours.  During the night take two caplets (4mg ) at bed time and continue every four hours during the night until the morning.  Stop taking imodium only after there is no sign of diarrhea for 12 hours (back to your normal baseline)

## 2016-02-21 ENCOUNTER — Encounter (HOSPITAL_COMMUNITY): Payer: Self-pay | Admitting: Hematology & Oncology

## 2016-03-10 ENCOUNTER — Encounter (HOSPITAL_BASED_OUTPATIENT_CLINIC_OR_DEPARTMENT_OTHER): Payer: Medicare Other | Admitting: Oncology

## 2016-03-10 ENCOUNTER — Encounter (HOSPITAL_COMMUNITY): Payer: Medicare Other | Attending: Hematology & Oncology

## 2016-03-10 VITALS — BP 153/76 | HR 55 | Temp 97.5°F | Resp 16 | Wt 259.0 lb

## 2016-03-10 DIAGNOSIS — C7951 Secondary malignant neoplasm of bone: Secondary | ICD-10-CM

## 2016-03-10 DIAGNOSIS — R972 Elevated prostate specific antigen [PSA]: Secondary | ICD-10-CM | POA: Diagnosis not present

## 2016-03-10 DIAGNOSIS — R110 Nausea: Secondary | ICD-10-CM | POA: Diagnosis not present

## 2016-03-10 DIAGNOSIS — C61 Malignant neoplasm of prostate: Secondary | ICD-10-CM

## 2016-03-10 DIAGNOSIS — Z5111 Encounter for antineoplastic chemotherapy: Secondary | ICD-10-CM | POA: Diagnosis present

## 2016-03-10 LAB — CBC WITH DIFFERENTIAL/PLATELET
Basophils Absolute: 0 10*3/uL (ref 0.0–0.1)
Basophils Relative: 0 %
EOS PCT: 1 %
Eosinophils Absolute: 0.1 10*3/uL (ref 0.0–0.7)
HCT: 34.7 % — ABNORMAL LOW (ref 39.0–52.0)
Hemoglobin: 11.8 g/dL — ABNORMAL LOW (ref 13.0–17.0)
LYMPHS PCT: 13 %
Lymphs Abs: 1 10*3/uL (ref 0.7–4.0)
MCH: 32.3 pg (ref 26.0–34.0)
MCHC: 34 g/dL (ref 30.0–36.0)
MCV: 95.1 fL (ref 78.0–100.0)
Monocytes Absolute: 0.6 10*3/uL (ref 0.1–1.0)
Monocytes Relative: 8 %
Neutro Abs: 6.1 10*3/uL (ref 1.7–7.7)
Neutrophils Relative %: 78 %
PLATELETS: 194 10*3/uL (ref 150–400)
RBC: 3.65 MIL/uL — AB (ref 4.22–5.81)
RDW: 15.3 % (ref 11.5–15.5)
WBC: 7.9 10*3/uL (ref 4.0–10.5)

## 2016-03-10 LAB — PSA: PSA: 96.28 ng/mL — ABNORMAL HIGH (ref 0.00–4.00)

## 2016-03-10 LAB — COMPREHENSIVE METABOLIC PANEL
ALBUMIN: 4 g/dL (ref 3.5–5.0)
ALT: 19 U/L (ref 17–63)
AST: 21 U/L (ref 15–41)
Alkaline Phosphatase: 101 U/L (ref 38–126)
Anion gap: 9 (ref 5–15)
BUN: 16 mg/dL (ref 6–20)
CHLORIDE: 103 mmol/L (ref 101–111)
CO2: 27 mmol/L (ref 22–32)
CREATININE: 0.91 mg/dL (ref 0.61–1.24)
Calcium: 9.6 mg/dL (ref 8.9–10.3)
GFR calc Af Amer: >60 mL/min (ref >60)
GFR calc non Af Amer: >60 mL/min (ref >60)
GLUCOSE: 151 mg/dL — AB (ref 65–99)
Potassium: 3.6 mmol/L (ref 3.5–5.1)
SODIUM: 139 mmol/L (ref 135–145)
Total Bilirubin: 0.8 mg/dL (ref 0.3–1.2)
Total Protein: 6.3 g/dL — ABNORMAL LOW (ref 6.5–8.1)

## 2016-03-10 MED ORDER — FAMOTIDINE IN NACL 20-0.9 MG/50ML-% IV SOLN
INTRAVENOUS | Status: AC
  Filled 2016-03-10: qty 50

## 2016-03-10 MED ORDER — DEXAMETHASONE SODIUM PHOSPHATE 10 MG/ML IJ SOLN
INTRAMUSCULAR | Status: AC
  Filled 2016-03-10: qty 1

## 2016-03-10 MED ORDER — HEPARIN SOD (PORK) LOCK FLUSH 100 UNIT/ML IV SOLN
500.0000 [IU] | Freq: Once | INTRAVENOUS | Status: AC | PRN
  Administered 2016-03-10: 500 [IU]

## 2016-03-10 MED ORDER — PEGFILGRASTIM 6 MG/0.6ML ~~LOC~~ PSKT
PREFILLED_SYRINGE | SUBCUTANEOUS | Status: AC
  Filled 2016-03-10: qty 0.6

## 2016-03-10 MED ORDER — PEGFILGRASTIM 6 MG/0.6ML ~~LOC~~ PSKT
6.0000 mg | PREFILLED_SYRINGE | Freq: Once | SUBCUTANEOUS | Status: AC
  Administered 2016-03-10: 6 mg via SUBCUTANEOUS

## 2016-03-10 MED ORDER — DEXTROSE 5 % IV SOLN
20.0000 mg/m2 | Freq: Once | INTRAVENOUS | Status: AC
  Administered 2016-03-10: 48 mg via INTRAVENOUS
  Filled 2016-03-10: qty 4.8

## 2016-03-10 MED ORDER — DIPHENHYDRAMINE HCL 50 MG/ML IJ SOLN
25.0000 mg | Freq: Once | INTRAMUSCULAR | Status: AC
  Administered 2016-03-10: 25 mg via INTRAVENOUS

## 2016-03-10 MED ORDER — DEXAMETHASONE SODIUM PHOSPHATE 10 MG/ML IJ SOLN
10.0000 mg | Freq: Once | INTRAMUSCULAR | Status: AC
  Administered 2016-03-10: 10 mg via INTRAVENOUS

## 2016-03-10 MED ORDER — SODIUM CHLORIDE 0.9 % IV SOLN
Freq: Once | INTRAVENOUS | Status: AC
  Administered 2016-03-10: 10:00:00 via INTRAVENOUS

## 2016-03-10 MED ORDER — SODIUM CHLORIDE 0.9% FLUSH: 10.0000 mL | INTRAVENOUS | Status: DC | PRN

## 2016-03-10 MED ORDER — FAMOTIDINE IN NACL 20-0.9 MG/50ML-% IV SOLN
20.0000 mg | Freq: Once | INTRAVENOUS | Status: AC
  Administered 2016-03-10: 20 mg via INTRAVENOUS

## 2016-03-10 MED ORDER — DIPHENHYDRAMINE HCL 50 MG/ML IJ SOLN
INTRAMUSCULAR | Status: AC
  Filled 2016-03-10: qty 1

## 2016-03-10 MED ORDER — SODIUM CHLORIDE 0.9 % IV SOLN: 10.0000 mg | Freq: Once | INTRAVENOUS | Status: DC

## 2016-03-10 MED ORDER — OXYCODONE-ACETAMINOPHEN 10-325 MG PO TABS: 1.0000 | ORAL_TABLET | Freq: Four times a day (QID) | ORAL | 0 refills | Status: DC | PRN

## 2016-03-10 NOTE — Patient Instructions (Signed)
[facility] Discharge Instructions for Patients Receiving Chemotherapy   Beginning January 23rd 2017 lab work for the Cancer Center will be done in the  Main lab at [facility] on 1st floor. If you have a lab appointment with the Cancer Center please come in thru the  Main Entrance and check in at the main information desk   Today you received the following chemotherapy agent: Jevtana.     If you develop nausea and vomiting, or diarrhea that is not controlled by your medication, call the clinic.  The clinic phone number is [phone]. Office hours are Monday-Friday 8:30am-5:00pm.  BELOW ARE SYMPTOMS THAT SHOULD BE REPORTED IMMEDIATELY:  *FEVER GREATER THAN 101.0 F  *CHILLS WITH OR WITHOUT FEVER  NAUSEA AND VOMITING THAT IS NOT CONTROLLED WITH YOUR NAUSEA MEDICATION  *UNUSUAL SHORTNESS OF BREATH  *UNUSUAL BRUISING OR BLEEDING  TENDERNESS IN MOUTH AND THROAT WITH OR WITHOUT PRESENCE OF ULCERS  *URINARY PROBLEMS  *BOWEL PROBLEMS  UNUSUAL RASH Items with * indicate a potential emergency and should be followed up as soon as possible. If you have an emergency after office hours please contact your primary care physician or go to the nearest emergency department.  Please call the clinic during office hours if you have any questions or concerns.   You may also contact the Patient Navigator at [phone] should you have any questions or need assistance in obtaining follow up care.      Resources For Cancer Patients and their Caregivers ? American Cancer Society: Can assist with transportation, wigs, general needs, runs Look Good Feel Better.        1-888-227-6333 ? Cancer Care: Provides financial assistance, online support groups, medication/co-pay assistance.  1-800-813-HOPE (4673) ? [facility] Assists [place] cancer patients and their families through emotional , educational and financial support.   336-427-4357 ? [place] DSS Where to apply for food stamps, Medicaid and utility assistance. 336-342-1394 ? RCATS: Transportation to medical appointments. 336-347-2287 ? Social Security Administration: May apply for disability if have a Stage IV cancer. 336-342-7796 1-800-772-1213 ? [place] Aging, Disability and Transit Services: Assists with nutrition, care and transit needs. 336-349-2343          

## 2016-03-10 NOTE — Progress Notes (Addendum)
[name], MD [place] [address]  Prostate cancer metastatic to bone [facility]) - Plan: oxyCODONE-acetaminophen (PERCOCET) 10-325 MG tablet  CURRENT THERAPY: Carbazitaxel beginning on 01/28/2016  INTERVAL HISTORY: Micheal Davis 71 y.o. 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  Currently on Depo-Lupron monthly and systemic chemotherapy with Carbazitaxel beginning on 01/28/2016.  Previous treatments include: Casodex, Zytiga + Prednisone, and Xtandi.  Additionally, he recently completed palliative XRT to left femur.    Prostate cancer metastatic to bone ([place])   10/01/2012 Initial Diagnosis    Prostate biopsied with highest Gleason score of 9 seen and the lowest score was 7.      10/04/2012 - 05/16/2013 Chemotherapy    Depo-Lupron and Casodex initiated      05/16/2013 -  Chemotherapy    Depo-Lupron monthly continued      05/16/2013 Progression    Progression by PSA elevation      05/16/2013 - 10/22/2014 Chemotherapy    Abiraterone and prednisone initiated in conjunction with ongoing Depo-Lupron.  Denosumab also ongoing.      10/23/2014 Progression    PSA increasing from 0.2- 1.6 in less than 6 months.        10/23/2014 - 01/30/2015 Chemotherapy    Enzalutamide and Prednisone (5 mg in AM and 2.5 mg in PM)      01/30/2015 Imaging    Bone scan- New focus of intense activity in right proximal humerus.  Interim increase in activity over left hip.      01/30/2015 Progression    Bone scan reveals new disease in right humerus consistent with progression of disease      01/31/2015 Imaging    Right humerus xray- blastic foci in proximal right humeral metaphysis and over right mid-humeral diaphysis.  No evidence of fracture      07/06/2015 Progression    Progression in multiple bones especially L hip and femurs      07/06/2015 Imaging    Bone scan-  progressive multifocal osseous metastases in the right proximal femora and distal femoral shafts.  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 Tammi Klippel      11/23/2015 - 01/04/2016 Chemotherapy    The patient had pegfilgrastim (NEULASTA ONPRO KIT) injection 6 mg, 6 mg, Subcutaneous, Once, 3 of 7 cycles  DOCEtaxel (TAXOTERE) 180 mg in dextrose 5 % 250 mL chemo infusion, 75 mg/m2 = 180 mg, Intravenous,  Once, 3 of 7 cycles Dose modification: 64 mg/m2 (original dose 75 mg/m2, Cycle 2, Reason: Dose not tolerated)  pegfilgrastim (NEULASTA ONPRO KIT) injection 6 mg, 6 mg, Subcutaneous, Once, 0 of 4 cycles  cabazitaxel (JEVTANA) 60 mg in dextrose 5 % 250 mL chemo infusion, 25 mg/m2, Intravenous,  Once, 0 of 4 cycles  for chemotherapy treatment.        11/30/2015 Adverse Reaction    Diarrhea (secondary to chemotherapy) and dehydration requiring IV fluids      12/14/2015 Treatment Plan Change    Docetaxel dose reduced by 15%      12/31/2015 Procedure    Port placed by Dr. Arnoldo Morale      01/28/2016 -  Chemotherapy    Cabazitaxel [facility])        He is tolerating treatment  well.  He denies any nausea or vomiting associated with treatment.  He denies any peripheral neuropathy symptoms.    He does note some nausea associated with greasy foods.  He notes that he takes his nausea medicine and this helps.  He is not on any PPI therapy.  He notes some front teeth sensitivity to cold food/products.  He has an appointment with his dentist on Wednesday to have a filling repaired.  I have asked him to follow-up with his dentist.  He requests refills on a few medications.  Review of Systems  Constitutional: Negative.  Negative for chills and fever.  HENT: Negative.   Eyes: Negative.   Respiratory: Negative.  Negative for cough.   Cardiovascular: Negative.  Negative for chest pain  and palpitations.  Gastrointestinal: Positive for nausea. Negative for vomiting.  Genitourinary: Negative.   Musculoskeletal: Negative.   Skin: Negative.   Neurological: Negative.   Endo/Heme/Allergies: Negative.   Psychiatric/Behavioral: Negative.     Past Medical History:  Diagnosis Date  . Diabetes mellitus without complication ([place])   . Hypertension   . Prostate cancer ([place]) 09/05/2015  . Prostate cancer metastatic to bone ([place]) 09/05/2015  . Sleep apnea   . Thyroid disease     Past Surgical History:  Procedure Laterality Date  . CATARACT EXTRACTION    . PORTACATH PLACEMENT Left 12/31/2015   Procedure: INSERTION PORT-A-CATH LEFT SUBCLAVIAN;  Surgeon: Aviva Signs, MD;  Location: [facility];  Service: General;  Laterality: Left;  . REPLACEMENT TOTAL KNEE Left     No family history on file.  Social History   Social History  . Marital status: Married    Spouse name: N/A  . Number of children: N/A  . Years of education: N/A   Social History Main Topics  . Smoking status: Never Smoker  . Smokeless tobacco: Never Used  . Alcohol use Yes     Comment: 1 beer each month  . Drug use: No  . Sexual activity: No     Comment: married   Other Topics Concern  . Not on file   Social History Narrative  . No narrative on file     PHYSICAL EXAMINATION  ECOG PERFORMANCE STATUS: 1 - Symptomatic but completely ambulatory  There were no vitals filed for this visit.  Vitals - 1 value per visit 04/05/3359  SYSTOLIC 224  DIASTOLIC 76  Pulse 68  Temperature 97.9  Respirations 16  Weight (lb) 259    GENERAL:alert, no distress, well nourished, well developed, comfortable, cooperative, obese, smiling and unaccompanied. SKIN: skin color, texture, turgor are normal, no rashes or significant lesions HEAD: Normocephalic, No masses, lesions, tenderness or abnormalities EYES: normal, EOMI, Conjunctiva are pink and non-injected EARS: External ears normal OROPHARYNX:lips, buccal mucosa,  and tongue normal and mucous membranes are moist  NECK: supple, no adenopathy, trachea midline LYMPH:  no palpable lymphadenopathy BREAST:not examined LUNGS: clear to auscultation and percussion HEART: regular rate & rhythm, no murmurs, no gallops, S1 normal and S2 normal ABDOMEN:abdomen soft, obese and normal bowel sounds BACK: No CVA tenderness EXTREMITIES:less then 2 second capillary refill, no joint deformities, effusion, or inflammation, no skin discoloration, no cyanosis  NEURO: alert & oriented x 3 with fluent speech, no focal motor/sensory deficits, gait normal   LABORATORY DATA: CBC    Component Value Date/Time   WBC 7.9 03/10/2016 0844   RBC 3.65 (L) 03/10/2016 0844   HGB 11.8 (L) 03/10/2016 0844   HCT 34.7 (L) 03/10/2016 0844   PLT  194 03/10/2016 0844   MCV 95.1 03/10/2016 0844   MCH 32.3 03/10/2016 0844   MCHC 34.0 03/10/2016 0844   RDW 15.3 03/10/2016 0844   LYMPHSABS 1.0 03/10/2016 0844   MONOABS 0.6 03/10/2016 0844   EOSABS 0.1 03/10/2016 0844   BASOSABS 0.0 03/10/2016 0844      Chemistry      Component Value Date/Time   NA 139 03/10/2016 0844   K 3.6 03/10/2016 0844   CL 103 03/10/2016 0844   CO2 27 03/10/2016 0844   BUN 16 03/10/2016 0844   CREATININE 0.91 03/10/2016 0844      Component Value Date/Time   CALCIUM 9.6 03/10/2016 0844   ALKPHOS 101 03/10/2016 0844   AST 21 03/10/2016 0844   ALT 19 03/10/2016 0844   BILITOT 0.8 03/10/2016 0844     Lab Results  Component Value Date   PSA 96.28 (H) 03/10/2016   PSA 88.36 (H) 02/18/2016   PSA 79.58 (H) 01/28/2016     PENDING LABS:   RADIOGRAPHIC STUDIES:  No results found.   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  Currently on Depo-Lupron monthly and systemic chemotherapy with Carbazitaxel beginning on 01/28/2016.  Previous  treatments include: Casodex, Zytiga + Prednisone, and Xtandi.  Additionally, he recently complete palliative XRT to left femur.    Oncology history is updated.  Pre-treatment labs today: CBC diff, CMET, PSA.  I personally reviewed and went over laboratory results with the patient.  The results are noted within this dictation.  PSA is not responding to therapy as expected without significant decrease at this point in time, despite change in therapy to [facility].  If PSA does not demonstrate response today, will refer patient to Dr. Alen Blew in [facility] for second opinion.  I have refilled his Compazine and Pain medication (after reviewing the [place]).  He will follow-up with his dentist regarding teeth sensitivity to cold.  In the interim, he will use Senodyne toothpaste.  He notes nausea following greasy food consumption.  Compazine is effective at resolving this issue.  I have given him samples for Nexium.  If effective, he can use OTC Nexium or I can call in an Rx for this medication.  Due to cost concerns, other OTC options include Pepcid, Zantac, etc.  Depo-Lupron is due on 03/13/2016.  Continue with Depo-Lupron monthly as was done in [place], at [facility]  Return as scheduled for follow-up and cycle #4 of chemotherapy.  Addendum:  PSA continues to climb.  I have sent a message to our scheduler to refer this patient to Dr. Alen Blew for a second opinion.   ORDERS PLACED FOR THIS ENCOUNTER: No orders of the defined types were placed in this encounter.   MEDICATIONS PRESCRIBED THIS ENCOUNTER: Meds ordered this encounter  Medications  . oxyCODONE-acetaminophen (PERCOCET) 10-325 MG tablet    Sig: Take 1 tablet by mouth every 6 (six) hours as needed for pain.    Dispense:  90 tablet    Refill:  0    THERAPY PLAN:  Continue palliative chemotherapy as outlined above.  If PSA does not decline, will refer to Dr. Alen Blew for second opinion.  All questions  were answered. The patient knows to call the clinic with any problems, questions or concerns. We can certainly see the patient much sooner if necessary.  Patient  and plan discussed with Dr. Ancil Linsey and she is in agreement with the aforementioned.   This note is electronically signed by: Doy Mince 03/10/2016 4:15 PM

## 2016-03-10 NOTE — Patient Instructions (Addendum)
[place] at [facility] Discharge Instructions  RECOMMENDATIONS MADE BY THE CONSULTANT AND ANY TEST RESULTS WILL BE SENT TO YOUR REFERRING PHYSICIAN.  Depo Lupron on 03/13/16 - scheduled  Meds refilled: Compazine and Oxycodone  Samples of Nexium given - call if effective and we will call in a prescription  Other OTC options instead of Nexium includes: Prilosec, Pepcid, Zantac  If PSA is down today we will refer to Dr. Alen Blew in [place]  Return for follow up as scheduled with cycle #4  Thank you for choosing [place] at [facility] to provide your oncology and hematology care.  To afford each patient quality time with our provider, please arrive at least 15 minutes before your scheduled appointment time.    If you have a lab appointment with the [facility] please come in thru the  Main Entrance and check in at the main information desk  You need to re-schedule your appointment should you arrive 10 or more minutes late.  We strive to give you quality time with our providers, and arriving late affects you and other patients whose appointments are after yours.  Also, if you no show three or more times for appointments you may be dismissed from the clinic at the providers discretion.     Again, thank you for choosing [facility].  Our hope is that these requests will decrease the amount of time that you wait before being seen by our physicians.       _____________________________________________________________  Should you have questions after your visit to [facility], please contact our office at [phone] between the hours of 8:30 a.m. and 4:30 p.m.  Voicemails left after 4:30 p.m. will not be returned until the following business day.  For prescription refill requests, have your pharmacy contact our office.       Resources For Cancer Patients and their Caregivers ? American Cancer Society: Can  assist with transportation, wigs, general needs, runs Look Good Feel Better.        646 409 9616 ? Cancer Care: Provides financial assistance, online support groups, medication/co-pay assistance.  1-800-813-HOPE 725-110-0591) ? [place] Assists [place] cancer patients and their families through emotional , educational and financial support.  279 799 0939 ? [place] DSS Where to apply for food stamps, Medicaid and utility assistance. (817)109-6920 ? [facility]: Transportation to medical appointments. 317-227-3087 ? Social Security Administration: May apply for disability if have a Stage IV cancer. 805-034-2537 828-101-5823 ? [employer], Disability and Transit Services: Assists with nutrition, care and transit needs. [place] Support Programs: @10RELATIVEDAYS @ > Cancer Support Group  2nd Tuesday of the month 1pm-2pm, Journey Room  > Creative Journey  3rd Tuesday of the month 1130am-1pm, Journey Room  > Look Good Feel Better  1st Wednesday of the month 10am-12 noon, Journey Room (Call [place] to register [phone])

## 2016-03-10 NOTE — Progress Notes (Signed)
Patient tolerated infusion well.  VSS.  Patient ambulatory and stable upon discharge from clinic.   

## 2016-03-10 NOTE — Assessment & Plan Note (Addendum)
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  Currently on Depo-Lupron monthly and systemic chemotherapy with Carbazitaxel beginning on 01/28/2016.  Previous treatments include: Casodex, Zytiga + Prednisone, and Xtandi.  Additionally, he recently complete palliative XRT to left femur.    Oncology history is updated.  Pre-treatment labs today: CBC diff, CMET, PSA.  I personally reviewed and went over laboratory results with the patient.  The results are noted within this dictation.  PSA is not responding to therapy as expected without significant decrease at this point in time, despite change in therapy to [facility].  If PSA does not demonstrate response today, will refer patient to Dr. Alen Blew in [facility] for second opinion.  I have refilled his Compazine and Pain medication (after reviewing the [place]).  He will follow-up with his dentist regarding teeth sensitivity to cold.  In the interim, he will use Senodyne toothpaste.  He notes nausea following greasy food consumption.  Compazine is effective at resolving this issue.  I have given him samples for Nexium.  If effective, he can use OTC Nexium or I can call in an Rx for this medication.  Due to cost concerns, other OTC options include Pepcid, Zantac, etc.  Depo-Lupron is due on 03/13/2016.  Continue with Depo-Lupron monthly as was done in [place], at [facility]  Return as scheduled for follow-up and cycle #4 of chemotherapy.  Addendum:  PSA continues to climb.  I have sent a message to our scheduler to refer this patient to Dr. Alen Blew for a second opinion.

## 2016-03-11 ENCOUNTER — Encounter: Payer: Self-pay | Admitting: Oncology

## 2016-03-11 ENCOUNTER — Telehealth: Payer: Self-pay | Admitting: Oncology

## 2016-03-11 NOTE — Telephone Encounter (Signed)
Appt scheduled for the pt to see Shadad on 1/19 at 2pm. Aware to arrive 30 minutes early. Pt agreed. Demographics verified. Letter and directions mailed.

## 2016-03-13 ENCOUNTER — Encounter (HOSPITAL_BASED_OUTPATIENT_CLINIC_OR_DEPARTMENT_OTHER): Payer: Medicare Other

## 2016-03-13 ENCOUNTER — Other Ambulatory Visit (HOSPITAL_COMMUNITY): Payer: Self-pay | Admitting: Oncology

## 2016-03-13 ENCOUNTER — Encounter (HOSPITAL_COMMUNITY): Payer: Self-pay

## 2016-03-13 VITALS — BP 137/68 | HR 66 | Temp 98.2°F | Resp 16

## 2016-03-13 DIAGNOSIS — K219 Gastro-esophageal reflux disease without esophagitis: Secondary | ICD-10-CM

## 2016-03-13 DIAGNOSIS — C61 Malignant neoplasm of prostate: Secondary | ICD-10-CM

## 2016-03-13 DIAGNOSIS — Z5111 Encounter for antineoplastic chemotherapy: Secondary | ICD-10-CM

## 2016-03-13 DIAGNOSIS — C7951 Secondary malignant neoplasm of bone: Secondary | ICD-10-CM

## 2016-03-13 MED ORDER — LEUPROLIDE ACETATE 7.5 MG IM KIT
7.5000 mg | PACK | INTRAMUSCULAR | Status: DC
  Administered 2016-03-13: 7.5 mg via INTRAMUSCULAR
  Filled 2016-03-13: qty 7.5

## 2016-03-13 MED ORDER — ESOMEPRAZOLE MAGNESIUM 20 MG PO CPDR: 20.0000 mg | DELAYED_RELEASE_CAPSULE | Freq: Every day | ORAL | 5 refills | Status: DC

## 2016-03-13 NOTE — Progress Notes (Signed)
Pt here today for Lupron injection. Pt given injection in right upper outer hip. PT tolerated well.  Pt stable and discharged home ambulatory. Pt to return as scheduled.

## 2016-03-21 ENCOUNTER — Ambulatory Visit (HOSPITAL_BASED_OUTPATIENT_CLINIC_OR_DEPARTMENT_OTHER): Payer: Medicare Other | Admitting: Oncology

## 2016-03-21 ENCOUNTER — Telehealth: Payer: Self-pay | Admitting: Oncology

## 2016-03-21 VITALS — BP 147/71 | HR 66 | Temp 97.7°F | Resp 17 | Ht 70.0 in | Wt 256.8 lb

## 2016-03-21 DIAGNOSIS — C61 Malignant neoplasm of prostate: Secondary | ICD-10-CM

## 2016-03-21 DIAGNOSIS — C7951 Secondary malignant neoplasm of bone: Secondary | ICD-10-CM | POA: Diagnosis not present

## 2016-03-21 DIAGNOSIS — E291 Testicular hypofunction: Secondary | ICD-10-CM | POA: Diagnosis not present

## 2016-03-21 NOTE — Progress Notes (Signed)
Reason for Referral: Prostate cancer.   HPI: 71 year old gentleman currently of [place], [place] where he lived the majority of his life. He is a gentleman with history of hypertension and diabetes and mild obesity that was diagnosed with prostate cancer in 2014. At that time he was noted to have an elevated PSA initially was 86 and repeat PSA was up to 126 after antibiotic treatment. He underwent a prostate biopsy on 09/30/2012. And the pathology report on 10/01/2012 was personally reviewed and showed prostate adenocarcinoma identified in multiple cores sampled. His Gleason score was 4+5 = 9 in 3 cores. He had a Gleason score 5+3 = 8 in 5 cores and 3+4 = 7 in 2 cores. His disease appears to be high volume at that time. He appears to have advanced disease at the time of presentation with bone disease and lymphadenopathy. He was treated with androgen deprivation between August 2014 and March 2015. He was initially receiving Lupron and Casodex. 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 Is PSA went up to 1.6 and subsequently treated with Xtandi between August 2016 and November 2016. He developed progression of disease at that time with multiple bone involvement and left femur sclerotic bony metastasis and received palliative radiation therapy between 07/16/2015 in 07/31/2015.  He subsequently received Taxotere chemotherapy with cycle 1 received on 11/23/2015 at 75 mg/m. And he subsequently did receive 2 more cycles at a reduced dose of 64 mg/m last cycle was given on 01/04/2016. His PSA on this treatment went up from 49-69 and subsequently to 84. His PSA on 01/28/2016 was 79. He did have some mild nausea associated with this medication but he felt this medication was discontinued because of lack of effectiveness.  He was treated with Jevtana chemotherapy with cycle 1 was given on 01/28/2016 and a total of 25  mg/m. He received total of 3 cycles last of which was given on 03/10/2016. His PSA in the interim continues to increase to 88 and most recently 296 on 03/10/2016. He is currently receiving his care under the care of Dr. [place] Muse at [facility]. I was asked by Dr. [place] Muse to comment on his care moving forward.  Overall, he remains in reasonable performance status. He does report occasional arthralgias that is apparently more arthritic in nature. He does report knee pain and shoulder pain for which she takes periodic Percocet. His last bone scan obtained on Jul 06 2015 which showed progression of disease. His last CT scan obtained on 08/24/2015 which did not show any visceral metastasis or lymphadenopathy.  He denied any headaches, blurry vision, syncope or seizures. He does not report any fevers, chills, sweats or weight loss. He is not report any chest pain, palpitation, orthopnea or leg edema. He does not report any cough, wheezing or hemoptysis. He does not report any nausea, vomiting or abdominal pain. He does not report any frequency urgency or hesitancy. He does not report any skeletal complaints of arthralgias or myalgias. Remaining review of systems unremarkable.      Past Medical History:  Diagnosis Date  . Diabetes mellitus without complication ([place])   . Hypertension   . Prostate cancer ([place]) 09/05/2015  . Prostate cancer metastatic to bone ([place]) 09/05/2015  . Sleep apnea   . Thyroid disease   :  Past Surgical History:  Procedure Laterality Date  . CATARACT EXTRACTION    . PORTACATH PLACEMENT Left 12/31/2015  Procedure: INSERTION PORT-A-CATH LEFT SUBCLAVIAN;  Surgeon: Aviva Signs, MD;  Location: [facility];  Service: General;  Laterality: Left;  . REPLACEMENT TOTAL KNEE Left   :   Current Outpatient Prescriptions:  .  atenolol (TENORMIN) 100 MG tablet, Take 100 mg by mouth daily., Disp: , Rfl:  .  atorvastatin (LIPITOR) 20 MG tablet, Take 10 mg by mouth daily., Disp: ,  Rfl:  .  Cabazitaxel (C[place]ule[place] IV), Inject into the vein. Every 3 weeks, Disp: , Rfl:  .  calcium carbonate (OS-CAL - DOSED IN MG OF ELEMENTAL CALCIUM) 1250 (500 Ca) MG tablet, Take 1 tablet by mouth daily with breakfast. , Disp: , Rfl:  .  dexamethasone (DECADRON) 4 MG tablet, , Disp: , Rfl:  .  diltiazem (CARDIZEM CD) 300 MG 24 hr capsule, Take 300 mg by mouth daily., Disp: , Rfl:  .  esomeprazole (NEXIUM) 20 MG capsule, Take 1 capsule (20 mg total) by mouth daily at 12 noon., Disp: 30 capsule, Rfl: 5 .  Glucosamine-Chondroit-Vit C-Mn (GLUCOSAMINE 1500 COMPLEX PO), Take 1 tablet by mouth 2 (two) times daily. , Disp: , Rfl:  .  levothyroxine (SYNTHROID, LEVOTHROID) 25 MCG tablet, Take 25 mcg by mouth daily before breakfast., Disp: , Rfl:  .  lidocaine-prilocaine (EMLA) cream, Apply to affected area once, Disp: 30 g, Rfl: 3 .  magic mouthwash w/lidocaine SOLN, 1 part of each of the following: Benadryl 12.72m /577m Viscous lidocaine 2%, Maalox. Swish and swallow 5 mL QID. (Patient taking differently: Take 5 mLs by mouth 4 (four) times daily as needed for mouth pain. 1 part of each of the following: Benadryl 12.71m57m71ml46miscous lidocaine 2%, Maalox. Swish and swallow 5 mL QID.), Disp: 240 mL, Rfl: 1 .  metFORMIN (GLUCOPHAGE) 500 MG tablet, Take 500 mg by mouth 2 (two) times daily with a meal., Disp: , Rfl:  .  Multiple Vitamin (MULTIVITAMIN WITH MINERALS) TABS tablet, Take 1 tablet by mouth daily., Disp: , Rfl:  .  Omega-3 Fatty Acids (FISH OIL) 1000 MG CAPS, Take 1 capsule by mouth daily., Disp: , Rfl:  .  ondansetron (ZOFRAN) 8 MG tablet, Take 1 tablet (8 mg total) by mouth 2 (two) times daily as needed (Nausea or vomiting)., Disp: 30 tablet, Rfl: 1 .  oxyCODONE-acetaminophen (PERCOCET) 10-325 MG tablet, Take 1 tablet by mouth every 6 (six) hours as needed for pain., Disp: 90 tablet, Rfl: 0 .  Pegfilgrastim (NEULASTA ONPRO [place]), Inject into the skin. Every 21 days, Disp: , Rfl:  .  potassium chloride  SA (K-DUR,KLOR-CON) 20 MEQ tablet, Take 1 tablet (20 mEq total) by mouth daily., Disp: 90 tablet, Rfl: 1 .  predniSONE (DELTASONE) 5 MG tablet, Take 1 tablet (5 mg total) by mouth 2 (two) times daily with a meal., Disp: 180 tablet, Rfl: 0 .  prochlorperazine (COMPAZINE) 10 MG tablet, Take 1 tablet (10 mg total) by mouth every 6 (six) hours as needed (Nausea or vomiting)., Disp: 30 tablet, Rfl: 1 .  tamsulosin (FLOMAX) 0.4 MG CAPS capsule, Take 0.8 mg by mouth daily. , Disp: , Rfl:  .  triamterene-hydrochlorothiazide (DYAZIDE) 37.5-25 MG capsule, Take 1 capsule by mouth daily., Disp: , Rfl:  .  vitamin E 400 UNIT capsule, Take 400 Units by mouth 2 (two) times daily. , Disp: , Rfl:  .  amoxicillin (AMOXIL) 500 MG capsule, Take 2,000 mg by mouth once as needed (prior to dental appointments). , Disp: , Rfl:  No current facility-administered medications for this visit.  Facility-Administered Medications Ordered in Other Visits:  .  leuprolide (LUPRON) injection 7.5 mg, 7.5 mg, Intramuscular, Q28 days, [facility], PA-C:  No Known Allergies:  No family history on file.:  Social History   Social History  . Marital status: Married    Spouse name: N/A  . Number of children: N/A  . Years of education: N/A   Occupational History  . Not on file.   Social History Main Topics  . Smoking status: Never Smoker  . Smokeless tobacco: Never Used  . Alcohol use Yes     Comment: 1 beer each month  . Drug use: No  . Sexual activity: No     Comment: married   Other Topics Concern  . Not on file   Social History Narrative  . No narrative on file  :  Pertinent items are noted in HPI.  Exam: Blood pressure (!) 147/71, pulse 66, temperature 97.7 F (36.5 C), temperature source Oral, resp. rate 17, height '5\' 10"'  (1.778 m), weight 256 lb 12.8 oz (116.5 kg), SpO2 98 %. General appearance: alert and cooperative Head: Normocephalic, without obvious abnormality Throat: lips, mucosa, and tongue  normal; teeth and gums normal Neck: no adenopathy Back: negative Resp: clear to auscultation bilaterally Chest wall: no tenderness Cardio: regular rate and rhythm, S1, S2 normal, no murmur, click, rub or gallop GI: soft, non-tender; bowel sounds normal; no masses,  no organomegaly Extremities: extremities normal, atraumatic, no cyanosis or edema Pulses: 2+ and symmetric  CBC    Component Value Date/Time   WBC 7.9 03/10/2016 0844   RBC 3.65 (L) 03/10/2016 0844   HGB 11.8 (L) 03/10/2016 0844   HCT 34.7 (L) 03/10/2016 0844   PLT 194 03/10/2016 0844   MCV 95.1 03/10/2016 0844   MCH 32.3 03/10/2016 0844   MCHC 34.0 03/10/2016 0844   RDW 15.3 03/10/2016 0844   LYMPHSABS 1.0 03/10/2016 0844   MONOABS 0.6 03/10/2016 0844   EOSABS 0.1 03/10/2016 0844   BASOSABS 0.0 03/10/2016 0844     Chemistry      Component Value Date/Time   NA 139 03/10/2016 0844   K 3.6 03/10/2016 0844   CL 103 03/10/2016 0844   CO2 27 03/10/2016 0844   BUN 16 03/10/2016 0844   CREATININE 0.91 03/10/2016 0844      Component Value Date/Time   CALCIUM 9.6 03/10/2016 0844   ALKPHOS 101 03/10/2016 0844   AST 21 03/10/2016 0844   ALT 19 03/10/2016 0844   BILITOT 0.8 03/10/2016 0844       Assessment and plan:  71 year old gentleman with the following issues:  1. Castration resistant metastatic prostate cancer with disease to the bone. His initial diagnosis was in August 2014 when he presented with elevated PSA 123 and a prostate biopsy showed a Gleason score 4+5 = 9 with high-volume disease. At that time he had advanced disease metastasis to the bone and possibly adenopathy.  He has been on multiple treatments in the past initially with combined androgen deprivation and subsequently developed castration resistant disease rather quickly. He was treated with Zytiga and then [place]. He received 3 cycles of Taxotere chemotherapy although his PSA continues to rise. Switching him to [facility] chemotherapy did not  appear to have impacted his PSA progression.  The natural course of this disease was discussed today with the patient and his wife. Any treatment options moving forward he understands they are palliative in nature. Before recommending further steps at this time. I recommend repeat staging  workup at this time. CT scan chest abdomen and pelvis would be the first step. Identifying lymphadenopathy or visceral metastasis would be important and would help tailor treatment accordingly. Obtaining tissue biopsy would be also important to identify any poor differentiation in this tumor and possibly identifying and actionable mutation utilizing next generation [place]. If a mutation is identified, especially defective DNA mismatch repair, oral targeted therapy might be utilized at this time. If no actionable mutation is identified, different strategy will be utilized.  If he continues to have disease only in the bone without visceral metastasis, it would be reasonable to consider Xofigo at this time. If visceral metastasis is identified adding carboplatin to Christinia Gully would be also useful strategy.  The plan at this point is to repeat CT scan of the abdomen and pelvis as well as CT scan of the chest. Biopsy any lymph nodes or visceral metastasis noted and send the sample for [facility] Medicine testing. Further recommendation will be given after the steps are accomplished.  2. Androgen depravation: I have recommended continuing Lupron indefinitely.  3. Follow-up: Will be in the next month after completing CT scan and a biopsy. I also recommended continuing Jevtana for the time being until further steps are identified.

## 2016-03-21 NOTE — Telephone Encounter (Signed)
Appointments scheduled per 1/19 LOS. Patient given AVS report and calendars with future scheduled appointments. Also given two bottles of contrast for CT scan appointment and instructions.

## 2016-03-26 ENCOUNTER — Ambulatory Visit: Payer: Medicare Other | Admitting: Oncology

## 2016-03-27 ENCOUNTER — Ambulatory Visit (HOSPITAL_COMMUNITY)
Admission: RE | Admit: 2016-03-27 | Discharge: 2016-03-27 | Disposition: A | Discharge status: Home / Self Care | Payer: Medicare Other | Source: Ambulatory Visit | Attending: Oncology | Admitting: Oncology

## 2016-03-27 ENCOUNTER — Encounter (HOSPITAL_COMMUNITY): Payer: Self-pay

## 2016-03-27 DIAGNOSIS — C61 Malignant neoplasm of prostate: Secondary | ICD-10-CM | POA: Diagnosis not present

## 2016-03-27 DIAGNOSIS — K769 Liver disease, unspecified: Secondary | ICD-10-CM | POA: Insufficient documentation

## 2016-03-27 DIAGNOSIS — I7090 Unspecified atherosclerosis: Secondary | ICD-10-CM | POA: Insufficient documentation

## 2016-03-27 DIAGNOSIS — C7951 Secondary malignant neoplasm of bone: Secondary | ICD-10-CM | POA: Insufficient documentation

## 2016-03-27 DIAGNOSIS — I2510 Atherosclerotic heart disease of native coronary artery without angina pectoris: Secondary | ICD-10-CM | POA: Diagnosis not present

## 2016-03-27 DIAGNOSIS — N281 Cyst of kidney, acquired: Secondary | ICD-10-CM | POA: Insufficient documentation

## 2016-03-27 MED ORDER — HEPARIN SOD (PORK) LOCK FLUSH 100 UNIT/ML IV SOLN
INTRAVENOUS | Status: AC
  Filled 2016-03-27: qty 5

## 2016-03-27 MED ORDER — IOPAMIDOL (ISOVUE-300) INJECTION 61%
INTRAVENOUS | Status: AC
  Administered 2016-03-27: 100 mL
  Filled 2016-03-27: qty 100

## 2016-03-31 ENCOUNTER — Telehealth: Payer: Self-pay | Admitting: *Deleted

## 2016-03-31 ENCOUNTER — Encounter (HOSPITAL_BASED_OUTPATIENT_CLINIC_OR_DEPARTMENT_OTHER): Payer: Medicare Other

## 2016-03-31 ENCOUNTER — Encounter (HOSPITAL_COMMUNITY): Payer: Self-pay

## 2016-03-31 ENCOUNTER — Encounter (HOSPITAL_BASED_OUTPATIENT_CLINIC_OR_DEPARTMENT_OTHER): Payer: Medicare Other | Admitting: Oncology

## 2016-03-31 VITALS — BP 136/72 | HR 68 | Temp 97.9°F | Resp 18

## 2016-03-31 VITALS — BP 159/78 | HR 64 | Temp 97.5°F | Resp 18 | Wt 259.6 lb

## 2016-03-31 DIAGNOSIS — C7951 Secondary malignant neoplasm of bone: Secondary | ICD-10-CM | POA: Diagnosis not present

## 2016-03-31 DIAGNOSIS — C61 Malignant neoplasm of prostate: Secondary | ICD-10-CM

## 2016-03-31 DIAGNOSIS — M25511 Pain in right shoulder: Secondary | ICD-10-CM

## 2016-03-31 DIAGNOSIS — R197 Diarrhea, unspecified: Secondary | ICD-10-CM

## 2016-03-31 DIAGNOSIS — Z5111 Encounter for antineoplastic chemotherapy: Secondary | ICD-10-CM

## 2016-03-31 LAB — COMPREHENSIVE METABOLIC PANEL
ALBUMIN: 3.9 g/dL (ref 3.5–5.0)
ALK PHOS: 111 U/L (ref 38–126)
ALT: 23 U/L (ref 17–63)
AST: 23 U/L (ref 15–41)
Anion gap: 10 (ref 5–15)
BUN: 15 mg/dL (ref 6–20)
CALCIUM: 9.3 mg/dL (ref 8.9–10.3)
CHLORIDE: 101 mmol/L (ref 101–111)
CO2: 26 mmol/L (ref 22–32)
CREATININE: 0.9 mg/dL (ref 0.61–1.24)
GFR calc Af Amer: >60 mL/min (ref >60)
GFR calc non Af Amer: >60 mL/min (ref >60)
GLUCOSE: 140 mg/dL — AB (ref 65–99)
Potassium: 3.8 mmol/L (ref 3.5–5.1)
SODIUM: 137 mmol/L (ref 135–145)
Total Bilirubin: 0.7 mg/dL (ref 0.3–1.2)
Total Protein: 6.1 g/dL — ABNORMAL LOW (ref 6.5–8.1)

## 2016-03-31 LAB — CBC WITH DIFFERENTIAL/PLATELET
BASOS ABS: 0 10*3/uL (ref 0.0–0.1)
BASOS PCT: 0 %
EOS ABS: 0.1 10*3/uL (ref 0.0–0.7)
EOS PCT: 1 %
HCT: 35.4 % — ABNORMAL LOW (ref 39.0–52.0)
HEMOGLOBIN: 12.2 g/dL — AB (ref 13.0–17.0)
Lymphocytes Relative: 10 %
Lymphs Abs: 1 10*3/uL (ref 0.7–4.0)
MCH: 32.4 pg (ref 26.0–34.0)
MCHC: 34.5 g/dL (ref 30.0–36.0)
MCV: 93.9 fL (ref 78.0–100.0)
Monocytes Absolute: 0.8 10*3/uL (ref 0.1–1.0)
Monocytes Relative: 7 %
NEUTROS PCT: 82 %
Neutro Abs: 8.5 10*3/uL — ABNORMAL HIGH (ref 1.7–7.7)
PLATELETS: 180 10*3/uL (ref 150–400)
RBC: 3.77 MIL/uL — ABNORMAL LOW (ref 4.22–5.81)
RDW: 14.8 % (ref 11.5–15.5)
WBC: 10.4 10*3/uL (ref 4.0–10.5)

## 2016-03-31 MED ORDER — FAMOTIDINE IN NACL 20-0.9 MG/50ML-% IV SOLN
20.0000 mg | Freq: Once | INTRAVENOUS | Status: AC
  Administered 2016-03-31: 20 mg via INTRAVENOUS

## 2016-03-31 MED ORDER — FAMOTIDINE IN NACL 20-0.9 MG/50ML-% IV SOLN
INTRAVENOUS | Status: AC
  Filled 2016-03-31: qty 50

## 2016-03-31 MED ORDER — DEXAMETHASONE SODIUM PHOSPHATE 10 MG/ML IJ SOLN
10.0000 mg | Freq: Once | INTRAMUSCULAR | Status: AC
  Administered 2016-03-31: 10 mg via INTRAVENOUS

## 2016-03-31 MED ORDER — HEPARIN SOD (PORK) LOCK FLUSH 100 UNIT/ML IV SOLN
500.0000 [IU] | Freq: Once | INTRAVENOUS | Status: DC | PRN
  Filled 2016-03-31 (×2): qty 5

## 2016-03-31 MED ORDER — SODIUM CHLORIDE 0.9 % IV SOLN
Freq: Once | INTRAVENOUS | Status: AC
  Administered 2016-03-31: 11:00:00 via INTRAVENOUS

## 2016-03-31 MED ORDER — SODIUM CHLORIDE 0.9 % IV SOLN: 10.0000 mg | Freq: Once | INTRAVENOUS | Status: DC

## 2016-03-31 MED ORDER — DIPHENHYDRAMINE HCL 50 MG/ML IJ SOLN
25.0000 mg | Freq: Once | INTRAMUSCULAR | Status: AC
  Administered 2016-03-31: 25 mg via INTRAVENOUS

## 2016-03-31 MED ORDER — DEXAMETHASONE SODIUM PHOSPHATE 10 MG/ML IJ SOLN
INTRAMUSCULAR | Status: AC
  Filled 2016-03-31: qty 1

## 2016-03-31 MED ORDER — SODIUM CHLORIDE 0.9 % IV SOLN
20.0000 mg/m2 | Freq: Once | INTRAVENOUS | Status: AC
  Administered 2016-03-31: 48 mg via INTRAVENOUS
  Filled 2016-03-31: qty 4.8

## 2016-03-31 MED ORDER — DIPHENHYDRAMINE HCL 50 MG/ML IJ SOLN
INTRAMUSCULAR | Status: AC
  Filled 2016-03-31: qty 1

## 2016-03-31 MED ORDER — SODIUM CHLORIDE 0.9% FLUSH: 10.0000 mL | INTRAVENOUS | Status: DC | PRN

## 2016-03-31 MED ORDER — PEGFILGRASTIM 6 MG/0.6ML ~~LOC~~ PSKT
6.0000 mg | PREFILLED_SYRINGE | Freq: Once | SUBCUTANEOUS | Status: AC
  Administered 2016-03-31: 6 mg via SUBCUTANEOUS
  Filled 2016-03-31: qty 0.6

## 2016-03-31 NOTE — Patient Instructions (Addendum)
[place] at [facility] Discharge Instructions  RECOMMENDATIONS MADE BY THE CONSULTANT AND ANY TEST RESULTS WILL BE SENT TO YOUR REFERRING PHYSICIAN.  You were seen today by Dr. Talbert Cage Follow up in 4 weeks and have lab work done 1-2 days prior   Thank you for choosing [place] at [facility] to provide your oncology and hematology care.  To afford each patient quality time with our provider, please arrive at least 15 minutes before your scheduled appointment time.    If you have a lab appointment with the [facility] please come in thru the  Main Entrance and check in at the main information desk  You need to re-schedule your appointment should you arrive 10 or more minutes late.  We strive to give you quality time with our providers, and arriving late affects you and other patients whose appointments are after yours.  Also, if you no show three or more times for appointments you may be dismissed from the clinic at the providers discretion.     Again, thank you for choosing [facility].  Our hope is that these requests will decrease the amount of time that you wait before being seen by our physicians.       _____________________________________________________________  Should you have questions after your visit to [facility], please contact our office at [phone] between the hours of 8:30 a.m. and 4:30 p.m.  Voicemails left after 4:30 p.m. will not be returned until the following business day.  For prescription refill requests, have your pharmacy contact our office.       Resources For Cancer Patients and their Caregivers ? American Cancer Society: Can assist with transportation, wigs, general needs, runs Look Good Feel Better.        (508) 275-6845 ? Cancer Care: Provides financial assistance, online support groups, medication/co-pay assistance.  1-800-813-HOPE 725 253 8923) ? [place] Assists [place] cancer patients and their families through emotional , educational and financial support.  774-011-6363 ? [place] DSS Where to apply for food stamps, Medicaid and utility assistance. 5093356621 ? [facility]: Transportation to medical appointments. 629 027 6165 ? Social Security Administration: May apply for disability if have a Stage IV cancer. (440)234-9408 (986) 808-6012 ? [employer], Disability and Transit Services: Assists with nutrition, care and transit needs. [facility] Support Programs: @10RELATIVEDAYS @ > Cancer Support Group  2nd Tuesday of the month 1pm-2pm, Journey Room  > Creative Journey  3rd Tuesday of the month 1130am-1pm, Journey Room  > Look Good Feel Better  1st Wednesday of the month 10am-12 noon, Journey Room (Call [facility] to register [phone])

## 2016-03-31 NOTE — Progress Notes (Signed)
Tolerated tx w/o adverse reaction.  Alert, in no distress.  VSS.  Discharged ambulatory in c/o spouse.  

## 2016-03-31 NOTE — Telephone Encounter (Signed)
Patient called and stated,"on February 19th I'm suppose to see Dr. Alen Blew at 09:00 am and then I have chemotherapy at 10:45 am at [facility]. I need to change my appointment date and time with Dr. Alen Blew. Return number is [phone]."

## 2016-03-31 NOTE — Patient Instructions (Signed)
[facility] Discharge Instructions for Patients Receiving Chemotherapy   Beginning January 23rd 2017 lab work for the Cancer Center will be done in the  Main lab at [facility] on 1st floor. If you have a lab appointment with the Cancer Center please come in thru the  Main Entrance and check in at the main information desk   Today you received the following chemotherapy agents:  Jevtana  If you develop nausea and vomiting, or diarrhea that is not controlled by your medication, call the clinic.  The clinic phone number is [phone]. Office hours are Monday-Friday 8:30am-5:00pm.  BELOW ARE SYMPTOMS THAT SHOULD BE REPORTED IMMEDIATELY:  *FEVER GREATER THAN 101.0 F  *CHILLS WITH OR WITHOUT FEVER  NAUSEA AND VOMITING THAT IS NOT CONTROLLED WITH YOUR NAUSEA MEDICATION  *UNUSUAL SHORTNESS OF BREATH  *UNUSUAL BRUISING OR BLEEDING  TENDERNESS IN MOUTH AND THROAT WITH OR WITHOUT PRESENCE OF ULCERS  *URINARY PROBLEMS  *BOWEL PROBLEMS  UNUSUAL RASH Items with * indicate a potential emergency and should be followed up as soon as possible. If you have an emergency after office hours please contact your primary care physician or go to the nearest emergency department.  Please call the clinic during office hours if you have any questions or concerns.   You may also contact the Patient Navigator at [phone] should you have any questions or need assistance in obtaining follow up care.      Resources For Cancer Patients and their Caregivers ? American Cancer Society: Can assist with transportation, wigs, general needs, runs Look Good Feel Better.        1-888-227-6333 ? Cancer Care: Provides financial assistance, online support groups, medication/co-pay assistance.  1-800-813-HOPE (4673) ? [facility] Assists [place] cancer patients and their families through emotional , educational and financial support.   336-427-4357 ? [place] DSS Where to apply for food stamps, Medicaid and utility assistance. 336-342-1394 ? [facility]: Transportation to medical appointments. 336-347-2287 ? Social Security Administration: May apply for disability if have a Stage IV cancer. 336-342-7796 1-800-772-1213 ? [place] [facility], Disability and Transit Services: Assists with nutrition, care and transit needs. 336-349-2343         
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
## 2016-04-01 ENCOUNTER — Telehealth: Payer: Self-pay | Admitting: Oncology

## 2016-04-01 NOTE — Telephone Encounter (Signed)
Spoke with patient re 2/26 f/u. Per 1/30 schedule message cancelled 2/19 f/u.

## 2016-04-01 NOTE — Telephone Encounter (Signed)
Sent pof to schedulers, for new appt 2/26 @ 9:00, cancel 2/19 appt and call patient

## 2016-04-01 NOTE — Telephone Encounter (Signed)
I can see him 2/26 instead same time.

## 2016-04-09 ENCOUNTER — Other Ambulatory Visit (HOSPITAL_COMMUNITY): Payer: Self-pay | Admitting: Hematology & Oncology

## 2016-04-09 DIAGNOSIS — C61 Malignant neoplasm of prostate: Secondary | ICD-10-CM

## 2016-04-09 DIAGNOSIS — C7951 Secondary malignant neoplasm of bone: Principal | ICD-10-CM

## 2016-04-14 ENCOUNTER — Encounter (HOSPITAL_COMMUNITY): Payer: Medicare Other

## 2016-04-14 ENCOUNTER — Encounter (HOSPITAL_COMMUNITY): Payer: Medicare Other | Attending: Hematology & Oncology

## 2016-04-14 ENCOUNTER — Encounter (HOSPITAL_COMMUNITY): Payer: Self-pay

## 2016-04-14 VITALS — BP 142/63 | HR 64 | Temp 97.9°F | Resp 18

## 2016-04-14 DIAGNOSIS — Z5111 Encounter for antineoplastic chemotherapy: Secondary | ICD-10-CM

## 2016-04-14 DIAGNOSIS — C7951 Secondary malignant neoplasm of bone: Secondary | ICD-10-CM | POA: Insufficient documentation

## 2016-04-14 DIAGNOSIS — C61 Malignant neoplasm of prostate: Secondary | ICD-10-CM | POA: Insufficient documentation

## 2016-04-14 LAB — COMPREHENSIVE METABOLIC PANEL
ALBUMIN: 4 g/dL (ref 3.5–5.0)
ALT: 20 U/L (ref 17–63)
ANION GAP: 10 (ref 5–15)
AST: 22 U/L (ref 15–41)
Alkaline Phosphatase: 113 U/L (ref 38–126)
BUN: 13 mg/dL (ref 6–20)
CALCIUM: 9.6 mg/dL (ref 8.9–10.3)
CO2: 27 mmol/L (ref 22–32)
Chloride: 101 mmol/L (ref 101–111)
Creatinine, Ser: 0.83 mg/dL (ref 0.61–1.24)
GFR calc non Af Amer: >60 mL/min (ref >60)
GLUCOSE: 133 mg/dL — AB (ref 65–99)
POTASSIUM: 3.9 mmol/L (ref 3.5–5.1)
SODIUM: 138 mmol/L (ref 135–145)
TOTAL PROTEIN: 6.4 g/dL — AB (ref 6.5–8.1)
Total Bilirubin: 0.8 mg/dL (ref 0.3–1.2)

## 2016-04-14 LAB — CBC WITH DIFFERENTIAL/PLATELET
BASOS PCT: 0 %
Basophils Absolute: 0 10*3/uL (ref 0.0–0.1)
EOS ABS: 0.1 10*3/uL (ref 0.0–0.7)
Eosinophils Relative: 1 %
HCT: 35.6 % — ABNORMAL LOW (ref 39.0–52.0)
Hemoglobin: 12.2 g/dL — ABNORMAL LOW (ref 13.0–17.0)
LYMPHS ABS: 1 10*3/uL (ref 0.7–4.0)
Lymphocytes Relative: 9 %
MCH: 32.5 pg (ref 26.0–34.0)
MCHC: 34.3 g/dL (ref 30.0–36.0)
MCV: 94.9 fL (ref 78.0–100.0)
MONO ABS: 0.5 10*3/uL (ref 0.1–1.0)
MONOS PCT: 4 %
Neutro Abs: 10 10*3/uL — ABNORMAL HIGH (ref 1.7–7.7)
Neutrophils Relative %: 86 %
Platelets: 148 10*3/uL — ABNORMAL LOW (ref 150–400)
RBC: 3.75 MIL/uL — ABNORMAL LOW (ref 4.22–5.81)
RDW: 15 % (ref 11.5–15.5)
WBC: 11.6 10*3/uL — ABNORMAL HIGH (ref 4.0–10.5)

## 2016-04-14 LAB — PSA: PSA: 68.32 ng/mL — ABNORMAL HIGH (ref 0.00–4.00)

## 2016-04-14 MED ORDER — LEUPROLIDE ACETATE 7.5 MG IM KIT
7.5000 mg | PACK | INTRAMUSCULAR | Status: DC
  Administered 2016-04-14: 7.5 mg via INTRAMUSCULAR
  Filled 2016-04-14: qty 7.5

## 2016-04-14 NOTE — Progress Notes (Signed)
Micheal Davis presents today for injection per MD orders. Lupron 7.5mg  administered IM in right upper buttock. Administration without incident. Patient tolerated well. Vitals stable and discharged home from clinic ambulatory. Follow up as scheduled.

## 2016-04-14 NOTE — Patient Instructions (Signed)
[place] at [facility] Discharge Instructions  RECOMMENDATIONS MADE BY THE CONSULTANT AND ANY TEST RESULTS WILL BE SENT TO YOUR REFERRING PHYSICIAN.  Lupron given today Bone scan was scheduled. Follow up as scheduled.  Thank you for choosing [place] at [facility] to provide your oncology and hematology care.  To afford each patient quality time with our provider, please arrive at least 15 minutes before your scheduled appointment time.    If you have a lab appointment with the [facility] please come in thru the  Main Entrance and check in at the main information desk  You need to re-schedule your appointment should you arrive 10 or more minutes late.  We strive to give you quality time with our providers, and arriving late affects you and other patients whose appointments are after yours.  Also, if you no show three or more times for appointments you may be dismissed from the clinic at the providers discretion.     Again, thank you for choosing [facility].  Our hope is that these requests will decrease the amount of time that you wait before being seen by our physicians.       _____________________________________________________________  Should you have questions after your visit to [facility], please contact our office at [phone] between the hours of 8:30 a.m. and 4:30 p.m.  Voicemails left after 4:30 p.m. will not be returned until the following business day.  For prescription refill requests, have your pharmacy contact our office.       Resources For Cancer Patients and their Caregivers ? American Cancer Society: Can assist with transportation, wigs, general needs, runs Look Good Feel Better.        (785) 687-4634 ? Cancer Care: Provides financial assistance, online support groups, medication/co-pay assistance.  1-800-813-HOPE 8315411104) ? [place] Assists [place]  cancer patients and their families through emotional , educational and financial support.  706-194-9567 ? [place] DSS Where to apply for food stamps, Medicaid and utility assistance. (820)398-3728 ? [facility]: Transportation to medical appointments. 629-751-1881 ? Social Security Administration: May apply for disability if have a Stage IV cancer. 346 366 0634 8170615043 ? [employer], Disability and Transit Services: Assists with nutrition, care and transit needs. [place] Support Programs: @10RELATIVEDAYS @ > Cancer Support Group  2nd Tuesday of the month 1pm-2pm, Journey Room  > Creative Journey  3rd Tuesday of the month 1130am-1pm, Journey Room  > Look Good Feel Better  1st Wednesday of the month 10am-12 noon, Journey Room (Call [place] to register [phone])

## 2016-04-15 ENCOUNTER — Other Ambulatory Visit (HOSPITAL_COMMUNITY): Payer: Self-pay

## 2016-04-15 DIAGNOSIS — C61 Malignant neoplasm of prostate: Secondary | ICD-10-CM

## 2016-04-15 DIAGNOSIS — C7951 Secondary malignant neoplasm of bone: Principal | ICD-10-CM

## 2016-04-15 MED ORDER — PREDNISONE 5 MG PO TABS: 5.0000 mg | ORAL_TABLET | Freq: Two times a day (BID) | ORAL | 0 refills | Status: DC

## 2016-04-15 NOTE — Telephone Encounter (Signed)
Received refill request from patients pharmacy for prednisone. Chart checked, reviewed with provider, and refilled.

## 2016-04-16 ENCOUNTER — Encounter (HOSPITAL_COMMUNITY)
Admission: RE | Admit: 2016-04-16 | Discharge: 2016-04-16 | Disposition: A | Discharge status: Home / Self Care | Payer: Medicare Other | Source: Ambulatory Visit | Attending: Oncology | Admitting: Oncology

## 2016-04-16 ENCOUNTER — Encounter (HOSPITAL_COMMUNITY): Payer: Self-pay

## 2016-04-16 DIAGNOSIS — C61 Malignant neoplasm of prostate: Secondary | ICD-10-CM | POA: Diagnosis not present

## 2016-04-16 DIAGNOSIS — C7951 Secondary malignant neoplasm of bone: Secondary | ICD-10-CM | POA: Insufficient documentation

## 2016-04-16 MED ORDER — TECHNETIUM TC 99M MEDRONATE IV KIT
25.0000 | PACK | Freq: Once | INTRAVENOUS | Status: AC | PRN
  Administered 2016-04-16: 20.2 via INTRAVENOUS

## 2016-04-21 ENCOUNTER — Encounter (HOSPITAL_BASED_OUTPATIENT_CLINIC_OR_DEPARTMENT_OTHER): Payer: Medicare Other | Admitting: Oncology

## 2016-04-21 ENCOUNTER — Encounter (HOSPITAL_BASED_OUTPATIENT_CLINIC_OR_DEPARTMENT_OTHER): Payer: Medicare Other

## 2016-04-21 ENCOUNTER — Encounter (HOSPITAL_COMMUNITY): Payer: Self-pay

## 2016-04-21 ENCOUNTER — Ambulatory Visit: Payer: Medicare Other | Admitting: Oncology

## 2016-04-21 VITALS — BP 149/65 | HR 67 | Temp 98.2°F | Resp 18 | Wt 260.4 lb

## 2016-04-21 DIAGNOSIS — C61 Malignant neoplasm of prostate: Secondary | ICD-10-CM

## 2016-04-21 DIAGNOSIS — C7951 Secondary malignant neoplasm of bone: Secondary | ICD-10-CM | POA: Diagnosis not present

## 2016-04-21 DIAGNOSIS — Z5111 Encounter for antineoplastic chemotherapy: Secondary | ICD-10-CM

## 2016-04-21 LAB — COMPREHENSIVE METABOLIC PANEL
ALBUMIN: 4 g/dL (ref 3.5–5.0)
ALT: 19 U/L (ref 17–63)
AST: 21 U/L (ref 15–41)
Alkaline Phosphatase: 106 U/L (ref 38–126)
Anion gap: 9 (ref 5–15)
BILIRUBIN TOTAL: 0.6 mg/dL (ref 0.3–1.2)
BUN: 17 mg/dL (ref 6–20)
CHLORIDE: 102 mmol/L (ref 101–111)
CO2: 28 mmol/L (ref 22–32)
Calcium: 9.5 mg/dL (ref 8.9–10.3)
Creatinine, Ser: 0.82 mg/dL (ref 0.61–1.24)
GFR calc Af Amer: >60 mL/min (ref >60)
GFR calc non Af Amer: >60 mL/min (ref >60)
GLUCOSE: 127 mg/dL — AB (ref 65–99)
POTASSIUM: 4 mmol/L (ref 3.5–5.1)
SODIUM: 139 mmol/L (ref 135–145)
Total Protein: 6.3 g/dL — ABNORMAL LOW (ref 6.5–8.1)

## 2016-04-21 LAB — CBC WITH DIFFERENTIAL/PLATELET
BASOS ABS: 0 10*3/uL (ref 0.0–0.1)
BASOS PCT: 0 %
Eosinophils Absolute: 0.1 10*3/uL (ref 0.0–0.7)
Eosinophils Relative: 2 %
HEMATOCRIT: 36.1 % — AB (ref 39.0–52.0)
Hemoglobin: 11.9 g/dL — ABNORMAL LOW (ref 13.0–17.0)
Lymphocytes Relative: 12 %
Lymphs Abs: 0.9 10*3/uL (ref 0.7–4.0)
MCH: 31.4 pg (ref 26.0–34.0)
MCHC: 33 g/dL (ref 30.0–36.0)
MCV: 95.3 fL (ref 78.0–100.0)
MONO ABS: 0.6 10*3/uL (ref 0.1–1.0)
Monocytes Relative: 8 %
NEUTROS ABS: 6 10*3/uL (ref 1.7–7.7)
Neutrophils Relative %: 78 %
PLATELETS: 176 10*3/uL (ref 150–400)
RBC: 3.79 MIL/uL — ABNORMAL LOW (ref 4.22–5.81)
RDW: 14.5 % (ref 11.5–15.5)
WBC: 7.6 10*3/uL (ref 4.0–10.5)

## 2016-04-21 MED ORDER — DEXAMETHASONE SODIUM PHOSPHATE 10 MG/ML IJ SOLN
INTRAMUSCULAR | Status: AC
  Filled 2016-04-21: qty 1

## 2016-04-21 MED ORDER — PEGFILGRASTIM 6 MG/0.6ML ~~LOC~~ PSKT
PREFILLED_SYRINGE | SUBCUTANEOUS | Status: AC
  Filled 2016-04-21: qty 0.6

## 2016-04-21 MED ORDER — FAMOTIDINE IN NACL 20-0.9 MG/50ML-% IV SOLN
20.0000 mg | Freq: Once | INTRAVENOUS | Status: AC
Start: 2016-04-21 — End: 2016-04-21
  Administered 2016-04-21: 20 mg via INTRAVENOUS

## 2016-04-21 MED ORDER — HEPARIN SOD (PORK) LOCK FLUSH 100 UNIT/ML IV SOLN
INTRAVENOUS | Status: AC
  Filled 2016-04-21: qty 5

## 2016-04-21 MED ORDER — POTASSIUM CHLORIDE CRYS ER 20 MEQ PO TBCR: 20.0000 meq | EXTENDED_RELEASE_TABLET | Freq: Every day | ORAL | 1 refills | Status: DC

## 2016-04-21 MED ORDER — OXYCODONE-ACETAMINOPHEN 10-325 MG PO TABS: 1.0000 | ORAL_TABLET | Freq: Four times a day (QID) | ORAL | 0 refills | Status: DC | PRN

## 2016-04-21 MED ORDER — DEXAMETHASONE SODIUM PHOSPHATE 10 MG/ML IJ SOLN
10.0000 mg | Freq: Once | INTRAMUSCULAR | Status: AC
  Administered 2016-04-21: 10 mg via INTRAVENOUS

## 2016-04-21 MED ORDER — DIPHENHYDRAMINE HCL 50 MG/ML IJ SOLN
INTRAMUSCULAR | Status: AC
  Filled 2016-04-21: qty 1

## 2016-04-21 MED ORDER — FAMOTIDINE IN NACL 20-0.9 MG/50ML-% IV SOLN
INTRAVENOUS | Status: AC
  Filled 2016-04-21: qty 50

## 2016-04-21 MED ORDER — HEPARIN SOD (PORK) LOCK FLUSH 100 UNIT/ML IV SOLN
500.0000 [IU] | Freq: Once | INTRAVENOUS | Status: AC | PRN
  Administered 2016-04-21: 500 [IU]

## 2016-04-21 MED ORDER — DEXTROSE 5 % IV SOLN
20.0000 mg/m2 | Freq: Once | INTRAVENOUS | Status: AC
  Administered 2016-04-21: 48 mg via INTRAVENOUS
  Filled 2016-04-21: qty 4.8

## 2016-04-21 MED ORDER — PEGFILGRASTIM 6 MG/0.6ML ~~LOC~~ PSKT
6.0000 mg | PREFILLED_SYRINGE | Freq: Once | SUBCUTANEOUS | Status: AC
  Administered 2016-04-21: 6 mg via SUBCUTANEOUS

## 2016-04-21 MED ORDER — SODIUM CHLORIDE 0.9 % IV SOLN
Freq: Once | INTRAVENOUS | Status: AC
  Administered 2016-04-21: 12:00:00 via INTRAVENOUS

## 2016-04-21 MED ORDER — DEXAMETHASONE SODIUM PHOSPHATE 100 MG/10ML IJ SOLN: 10.0000 mg | Freq: Once | INTRAMUSCULAR | Status: DC

## 2016-04-21 MED ORDER — DIPHENHYDRAMINE HCL 50 MG/ML IJ SOLN
25.0000 mg | Freq: Once | INTRAMUSCULAR | Status: AC
  Administered 2016-04-21: 25 mg via INTRAVENOUS

## 2016-04-21 NOTE — Patient Instructions (Signed)
[facility] Discharge Instructions for Patients Receiving Chemotherapy   Beginning January 23rd 2017 lab work for the [facility] will be done in the  Main lab at [facility] on 1st floor. If you have a lab appointment with the [place] please come in thru the  Main Entrance and check in at the main information desk   Today you received the following chemotherapy agents jevtana. Neulasta Onpro will dispense medication between 515 pm and 615 pm tomorrow Tue Feb 20th. You may device off after 615 pm tomorrow.  To help prevent nausea and vomiting after your treatment, we encourage you to take your nausea medication as instructed.   If you develop nausea and vomiting, or diarrhea that is not controlled by your medication, call the clinic.  The clinic phone number is [phone]. Office hours are Monday-Friday 8:30am-5:00pm.  BELOW ARE SYMPTOMS THAT SHOULD BE REPORTED IMMEDIATELY:  *FEVER GREATER THAN 101.0 F  *CHILLS WITH OR WITHOUT FEVER  NAUSEA AND VOMITING THAT IS NOT CONTROLLED WITH YOUR NAUSEA MEDICATION  *UNUSUAL SHORTNESS OF BREATH  *UNUSUAL BRUISING OR BLEEDING  TENDERNESS IN MOUTH AND THROAT WITH OR WITHOUT PRESENCE OF ULCERS  *URINARY PROBLEMS  *BOWEL PROBLEMS  UNUSUAL RASH Items with * indicate a potential emergency and should be followed up as soon as possible. If you have an emergency after office hours please contact your primary care physician or go to the nearest emergency department.  Please call the clinic during office hours if you have any questions or concerns.   You may also contact the Patient Navigator at [phone] should you have any questions or need assistance in obtaining follow up care.      Resources For Cancer Patients and their Caregivers ? American Cancer Society: Can assist with transportation, wigs, general needs, runs Look Good Feel Better.        680-245-3852 ? Cancer Care: Provides financial  assistance, online support groups, medication/co-pay assistance.  1-800-813-HOPE 281-487-4300) ? [place] Assists [place] cancer patients and their families through emotional , educational and financial support.  (838) 837-5929 ? [place] DSS Where to apply for food stamps, Medicaid and utility assistance. 727-333-9143 ? [facility]: Transportation to medical appointments. (763) 453-2737 ? Social Security Administration: May apply for disability if have a Stage IV cancer. 909 358 4206 910-333-9780 ? [employer], Disability and Transit Services: Assists with nutrition, care and transit needs. 847-180-2219

## 2016-04-21 NOTE — Patient Instructions (Signed)
[place] [facility] Discharge Instructions  RECOMMENDATIONS MADE BY THE CONSULTANT AND ANY TEST RESULTS WILL BE SENT TO YOUR REFERRING PHYSICIAN.  You were seen today by Dr. Barron Schmid Follow up in 4 weeks See Amy up front for appointments   Thank you for choosing [place] at [facility] to provide your oncology and hematology care.  To afford each patient quality time with our provider, please arrive at least 15 minutes before your scheduled appointment time.    If you have a lab appointment with the [facility] please come in thru the  Main Entrance and check in at the main information desk  You need to re-schedule your appointment should you arrive 10 or more minutes late.  We strive to give you quality time with our providers, and arriving late affects you and other patients whose appointments are after yours.  Also, if you no show three or more times for appointments you may be dismissed from the clinic at the providers discretion.     Again, thank you for choosing [facility].  Our hope is that these requests will decrease the amount of time that you wait before being seen by our physicians.       _____________________________________________________________  Should you have questions after your visit to [facility], please contact our office at [phone] between the hours of 8:30 a.m. and 4:30 p.m.  Voicemails left after 4:30 p.m. will not be returned until the following business day.  For prescription refill requests, have your pharmacy contact our office.       Resources For Cancer Patients and their Caregivers ? American Cancer Society: Can assist with transportation, wigs, general needs, runs Look Good Feel Better.        414-754-7862 ? Cancer Care: Provides financial assistance, online support groups, medication/co-pay assistance.  1-800-813-HOPE 941-127-8232) ? [place] [facility]ists [place] cancer patients and their families through emotional , educational and financial support.  404-408-5300 ? [place] DSS Where to apply for food stamps, Medicaid and utility assistance. 551-841-2385 ? [facility]: Transportation to medical appointments. 541-191-8345 ? Social Security Administration: May apply for disability if have a Stage IV cancer. (714)533-7309 956-753-7191 ? [employer], Disability and Transit Services: Assists with nutrition, care and transit needs. [place] Support Programs: @10RELATIVEDAYS @ > Cancer Support Group  2nd Tuesday of the month 1pm-2pm, Journey Room  > Creative Journey  3rd Tuesday of the month 1130am-1pm, Journey Room  > Look Good Feel Better  1st Wednesday of the month 10am-12 noon, Journey Room (Call [place] to register [phone])
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
## 2016-04-21 NOTE — Progress Notes (Signed)
Tolerated chemo well. Marland KitchenCristy Friedlander arrived today for [facility] neulasta on body injector. See MAR for administration details. Injector in place and engaged with green light indicator on flashing. Tolerated application with out problems. Stable and ambulatory on discharge home with wife.

## 2016-04-28 ENCOUNTER — Ambulatory Visit (HOSPITAL_BASED_OUTPATIENT_CLINIC_OR_DEPARTMENT_OTHER): Payer: Medicare Other | Admitting: Oncology

## 2016-04-28 ENCOUNTER — Ambulatory Visit (HOSPITAL_COMMUNITY): Payer: Medicare Other

## 2016-04-28 VITALS — BP 148/64 | HR 66 | Temp 98.0°F | Resp 20 | Ht 70.0 in | Wt 256.7 lb

## 2016-04-28 DIAGNOSIS — C61 Malignant neoplasm of prostate: Secondary | ICD-10-CM

## 2016-04-28 DIAGNOSIS — E291 Testicular hypofunction: Secondary | ICD-10-CM | POA: Diagnosis not present

## 2016-04-28 DIAGNOSIS — C7951 Secondary malignant neoplasm of bone: Secondary | ICD-10-CM | POA: Diagnosis not present

## 2016-04-28 NOTE — Progress Notes (Signed)
Hematology and Oncology Follow Up Visit  Micheal Davis [id number] 05-Mar-1945 71 y.o. 04/28/2016 9:31 AM Micheal Davis, MDBurdine, Micheal Evener, MD   Principle Diagnosis: 71 year old gentleman with prostate cancer diagnosed in 2014. His PSA was 126 and a Gleason score 4+5 = 9. He presented with advanced disease and bony metastasis at that time.   Prior Therapy:  He was treated with androgen deprivation between August 2014 and March 2015. He was initially receiving Lupron and Casodex.  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 PSA went up to 1.6 and subsequently treated with Xtandi between August 2016 and November 2016. He developed progression of disease at that time with multiple bone involvement and left femur sclerotic bony metastasis and received palliative radiation therapy between 07/16/2015 in 07/31/2015. He subsequently received Taxotere chemotherapy with cycle 1 received on 11/23/2015 at 75 mg/m. And he subsequently did receive 2 more cycles at a reduced dose of 64 mg/m last cycle was given on 01/04/2016. His PSA on this treatment went up from 49-69 and subsequently to 84. His PSA on 01/28/2016 was 79. He did have some mild nausea associated with this medication but he felt this medication was discontinued because of lack of effectiveness.  Current therapy:  Jevtana chemotherapy with cycle 1 was given on 01/28/2016 and a total of 25 mg/m. He received total of 5 cycles last of which was given on 04/21/2016. His PSA  decreased to 68.3 on 04/14/2016.  Interim History: Micheal Davis presents today for a follow-up visit. He is a gentleman I saw in consultation for a second opinion based on request from Dr. Whitney Muse. Since the last visit, he received fifth cycle of chemotherapy without any delayed complications. He does have mild diarrhea but have tolerated therapy without any other issues. He remains  active and attends to activities of daily living. He does report diffuse arthralgias and myalgias related to chemotherapy and bone pain. His appetite and performance status remained at baseline.  He denied any headaches, blurry vision, syncope or seizures. He does not report any fevers, chills, sweats or weight loss. He is not report any chest pain, palpitation, orthopnea or leg edema. He does not report any cough, wheezing or hemoptysis. He does not report any nausea, vomiting or abdominal pain. He does not report any frequency urgency or hesitancy. He does not report any skeletal complaints of arthralgias or myalgias. Remaining review of systems unremarkable   Medications: I have reviewed the patient's current medications.  Current Outpatient Prescriptions  Medication Sig Dispense Refill  . amoxicillin (AMOXIL) 500 MG capsule Take 2,000 mg by mouth once as needed (prior to dental appointments).     Marland Kitchen atenolol (TENORMIN) 100 MG tablet Take 100 mg by mouth daily.    Marland Kitchen atorvastatin (LIPITOR) 20 MG tablet Take 10 mg by mouth daily.    . Cabazitaxel (JEVTANA IV) Inject into the vein. Every 3 weeks    . calcium carbonate (OS-CAL - DOSED IN MG OF ELEMENTAL CALCIUM) 1250 (500 Ca) MG tablet Take 1 tablet by mouth daily with breakfast.     . dexamethasone (DECADRON) 4 MG tablet     . diltiazem (CARDIZEM CD) 300 MG 24 hr capsule Take 300 mg by mouth daily.    Marland Kitchen esomeprazole (NEXIUM) 20 MG capsule Take 1 capsule (20 mg total) by mouth daily at 12 noon. 30 capsule 5  . Glucosamine-Chondroit-Vit C-Mn (GLUCOSAMINE 1500 COMPLEX PO) Take 1 tablet  by mouth 2 (two) times daily.     Marland Kitchen levothyroxine (SYNTHROID, LEVOTHROID) 25 MCG tablet Take 25 mcg by mouth daily before breakfast.    . lidocaine-prilocaine (EMLA) cream Apply to affected area once 30 g 3  . magic mouthwash w/lidocaine SOLN 1 part of each of the following: Benadryl 12.5mg  /63ml, Viscous lidocaine 2%, Maalox. Swish and swallow 5 mL QID. (Patient taking  differently: Take 5 mLs by mouth 4 (four) times daily as needed for mouth pain. 1 part of each of the following: Benadryl 12.5mg  /57ml, Viscous lidocaine 2%, Maalox. Swish and swallow 5 mL QID.) 240 mL 1  . metFORMIN (GLUCOPHAGE) 500 MG tablet Take 500 mg by mouth 2 (two) times daily with a meal.    . Multiple Vitamin (MULTIVITAMIN WITH MINERALS) TABS tablet Take 1 tablet by mouth daily.    . Omega-3 Fatty Acids (FISH OIL) 1000 MG CAPS Take 1 capsule by mouth daily.    . ondansetron (ZOFRAN) 8 MG tablet Take 1 tablet (8 mg total) by mouth 2 (two) times daily as needed (Nausea or vomiting). 30 tablet 1  . oxyCODONE-acetaminophen (PERCOCET) 10-325 MG tablet Take 1 tablet by mouth every 6 (six) hours as needed for pain. 90 tablet 0  . Pegfilgrastim (NEULASTA ONPRO [place]) Inject into the skin. Every 21 days    . potassium chloride SA (K-DUR,KLOR-CON) 20 MEQ tablet Take 1 tablet (20 mEq total) by mouth daily. 90 tablet 1  . predniSONE (DELTASONE) 5 MG tablet Take 1 tablet (5 mg total) by mouth 2 (two) times daily with a meal. 180 tablet 0  . prochlorperazine (COMPAZINE) 10 MG tablet Take 1 tablet (10 mg total) by mouth every 6 (six) hours as needed (Nausea or vomiting). 30 tablet 1  . tamsulosin (FLOMAX) 0.4 MG CAPS capsule Take 0.8 mg by mouth daily.     Marland Kitchen triamterene-hydrochlorothiazide (DYAZIDE) 37.5-25 MG capsule Take 1 capsule by mouth daily.    Marland Kitchen triamterene-hydrochlorothiazide (MAXZIDE-25) 37.5-25 MG tablet Take 1 tablet by mouth daily. for high blood pressure  3  . vitamin E 400 UNIT capsule Take 400 Units by mouth 2 (two) times daily.      No current facility-administered medications for this visit.    Facility-Administered Medications Ordered in Other Visits  Medication Dose Route Frequency Provider Last Rate Last Dose  . leuprolide (LUPRON) injection 7.5 mg  7.5 mg Intramuscular Q28 days [facility], PA-C         Allergies: No Known Allergies  Past Medical History, Surgical history,  Social history, and Family History were reviewed and updated.   Remaining ROS negative. Physical Exam: Blood pressure (!) 148/64, pulse 66, temperature 98 F (36.7 C), temperature source Oral, resp. rate 20, height 5\' 10"  (1.778 m), weight 256 lb 11.2 oz (116.4 kg), SpO2 96 %. ECOG: 0 General appearance: alert and cooperative appeared without distress. Head: Normocephalic, without obvious abnormality Neck: no adenopathy Lymph nodes: Cervical, supraclavicular, and axillary nodes normal. Heart:regular rate and rhythm, S1, S2 normal, no murmur, click, rub or gallop Lung:chest clear, no wheezing, rales, normal symmetric air entry.  Abdomin: soft, non-tender, without masses or organomegaly EXT:no erythema, induration, or nodules   Lab Results: Lab Results  Component Value Date   WBC 7.6 04/21/2016   HGB 11.9 (L) 04/21/2016   HCT 36.1 (L) 04/21/2016   MCV 95.3 04/21/2016   PLT 176 04/21/2016     Chemistry      Component Value Date/Time   NA 139 04/21/2016 1046  K 4.0 04/21/2016 1046   CL 102 04/21/2016 1046   CO2 28 04/21/2016 1046   BUN 17 04/21/2016 1046   CREATININE 0.82 04/21/2016 1046      Component Value Date/Time   CALCIUM 9.5 04/21/2016 1046   ALKPHOS 106 04/21/2016 1046   AST 21 04/21/2016 1046   ALT 19 04/21/2016 1046   BILITOT 0.6 04/21/2016 1046     Results for HENOK, DELOZIER (MRN [id number]) as of 04/28/2016 08:05  Ref. Range 02/18/2016 09:21 03/10/2016 08:44 04/14/2016 10:33  PSA Latest Ref Range: 0.00 - 4.00 ng/mL 88.36 (H) 96.28 (H) 68.32 (H)   EXAM: NUCLEAR MEDICINE WHOLE BODY BONE SCAN  TECHNIQUE: Whole body anterior and posterior images were obtained approximately 3 hours after intravenous injection of radiopharmaceutical.  RADIOPHARMACEUTICALS:  20.2 MCi Technetium-67m MDP IV  COMPARISON:  07/06/2015.  FINDINGS: Bilateral renal function excretion. Multiple lesions noted throughout the axial and appendicular skeleton. These findings  have progressed from prior exam. New lesions are noted throughout the thoracic spine, sacrum, bilateral ribs, right femur, right tibia, and pelvis. Right proximal humeral severe increased uptake again noted. Findings consist with progressive metastatic disease.  IMPRESSION: Findings consist with progressive metastatic disease. Activity over the proximal right humerus and proximal bilateral femurs are worrisome for the possible development of pathologic fractures.  IMPRESSION: 1. Diffuse sclerotic osseous metastatic disease in the chest, abdomen, and pelvis without acute fracture identified. 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 These appear chronic. 2. The prostate gland is normal in size and no adenopathy is currently identified. 3. On a prior MRI of 10/04/2012, there was a posterior right hepatic lobe lesion. This lesion is not currently visible on today' s CT. This could be due to differences in cons acuity between CT or MRI, or resolution of the lesion. 4. Coronary, aortic arch, and branch vessel atherosclerotic vascular disease. Aortoiliac atherosclerotic vascular disease. 5. Single bilateral renal cysts.     Impression and Plan:   71 year old gentleman with the following issues:  1. Castration resistant metastatic prostate cancer with disease to the bone. His initial diagnosis was in August 2014 when he presented with elevated PSA 123 and a prostate biopsy showed a Gleason score 4+5 = 9 with high-volume disease. At that time he had advanced disease metastasis to the bone and possibly adenopathy.  He has been on multiple treatments in the past initially with combined androgen deprivation and subsequently developed castration resistant disease rather quickly. He was treated with Zytiga and then [place]. He received 3 cycles of Taxotere chemotherapy although his PSA continues to rise.   He is currently on Jevtana chemotherapy and  completed 5 cycles. His PSA has dropped slightly after cycle 4.  His CT scan and bone scan were personally reviewed and discussed with the patient. I have recommended continuing Jevtana chemotherapy for at least 5 more cycles of therapy if he continues to tolerated. Different salvage therapy will be used after that if he develops progression of disease. Given the fact that his bone disease only, Trudi Ida will be an option as well.  He elected to continue to get his care at any [facility] and I'm happy to see him in the future as needed.  2. Androgen deprivation: He is on Lupron to be continued indefinitely.  3. Follow-up: Will be as needed in the future.  [facility], MD 2/26/20189:31 AM

## 2016-04-30 ENCOUNTER — Other Ambulatory Visit (HOSPITAL_COMMUNITY): Payer: Self-pay | Admitting: Oncology

## 2016-05-12 ENCOUNTER — Encounter (HOSPITAL_COMMUNITY): Payer: Medicare Other | Attending: Oncology

## 2016-05-12 ENCOUNTER — Encounter (HOSPITAL_COMMUNITY): Payer: Medicare Other

## 2016-05-12 ENCOUNTER — Encounter (HOSPITAL_COMMUNITY): Payer: Self-pay

## 2016-05-12 VITALS — BP 140/60 | HR 55 | Temp 97.7°F | Resp 18 | Wt 261.0 lb

## 2016-05-12 DIAGNOSIS — C61 Malignant neoplasm of prostate: Secondary | ICD-10-CM | POA: Diagnosis not present

## 2016-05-12 DIAGNOSIS — C7951 Secondary malignant neoplasm of bone: Secondary | ICD-10-CM | POA: Diagnosis not present

## 2016-05-12 DIAGNOSIS — Z5111 Encounter for antineoplastic chemotherapy: Secondary | ICD-10-CM

## 2016-05-12 LAB — CBC WITH DIFFERENTIAL/PLATELET
BASOS PCT: 1 %
Basophils Absolute: 0 10*3/uL (ref 0.0–0.1)
EOS ABS: 0.1 10*3/uL (ref 0.0–0.7)
Eosinophils Relative: 1 %
HEMATOCRIT: 35.2 % — AB (ref 39.0–52.0)
HEMOGLOBIN: 12.1 g/dL — AB (ref 13.0–17.0)
LYMPHS ABS: 0.8 10*3/uL (ref 0.7–4.0)
Lymphocytes Relative: 12 %
MCH: 32.2 pg (ref 26.0–34.0)
MCHC: 34.4 g/dL (ref 30.0–36.0)
MCV: 93.6 fL (ref 78.0–100.0)
MONOS PCT: 6 %
Monocytes Absolute: 0.5 10*3/uL (ref 0.1–1.0)
NEUTROS ABS: 5.6 10*3/uL (ref 1.7–7.7)
NEUTROS PCT: 80 %
Platelets: 178 10*3/uL (ref 150–400)
RBC: 3.76 MIL/uL — ABNORMAL LOW (ref 4.22–5.81)
RDW: 14.5 % (ref 11.5–15.5)
WBC: 7 10*3/uL (ref 4.0–10.5)

## 2016-05-12 LAB — COMPREHENSIVE METABOLIC PANEL
ALK PHOS: 99 U/L (ref 38–126)
ALT: 18 U/L (ref 17–63)
ANION GAP: 10 (ref 5–15)
AST: 23 U/L (ref 15–41)
Albumin: 3.9 g/dL (ref 3.5–5.0)
BILIRUBIN TOTAL: 0.6 mg/dL (ref 0.3–1.2)
BUN: 17 mg/dL (ref 6–20)
CALCIUM: 9.3 mg/dL (ref 8.9–10.3)
CO2: 25 mmol/L (ref 22–32)
Chloride: 102 mmol/L (ref 101–111)
Creatinine, Ser: 0.83 mg/dL (ref 0.61–1.24)
GFR calc non Af Amer: >60 mL/min (ref >60)
Glucose, Bld: 150 mg/dL — ABNORMAL HIGH (ref 65–99)
POTASSIUM: 4.1 mmol/L (ref 3.5–5.1)
Sodium: 137 mmol/L (ref 135–145)
Total Protein: 6 g/dL — ABNORMAL LOW (ref 6.5–8.1)

## 2016-05-12 MED ORDER — LEUPROLIDE ACETATE 7.5 MG IM KIT
7.5000 mg | PACK | INTRAMUSCULAR | Status: DC
  Administered 2016-05-12: 7.5 mg via INTRAMUSCULAR
  Filled 2016-05-12: qty 7.5

## 2016-05-12 MED ORDER — SODIUM CHLORIDE 0.9% FLUSH
10.0000 mL | INTRAVENOUS | Status: DC | PRN
  Administered 2016-05-12: 10 mL
  Filled 2016-05-12: qty 10

## 2016-05-12 MED ORDER — FAMOTIDINE IN NACL 20-0.9 MG/50ML-% IV SOLN
20.0000 mg | Freq: Once | INTRAVENOUS | Status: AC
  Administered 2016-05-12: 20 mg via INTRAVENOUS
  Filled 2016-05-12: qty 50

## 2016-05-12 MED ORDER — DEXTROSE 5 % IV SOLN
20.0000 mg/m2 | Freq: Once | INTRAVENOUS | Status: AC
  Administered 2016-05-12: 48 mg via INTRAVENOUS
  Filled 2016-05-12: qty 4.8

## 2016-05-12 MED ORDER — HEPARIN SOD (PORK) LOCK FLUSH 100 UNIT/ML IV SOLN
500.0000 [IU] | Freq: Once | INTRAVENOUS | Status: AC | PRN
  Administered 2016-05-12: 500 [IU]
  Filled 2016-05-12: qty 5

## 2016-05-12 MED ORDER — SODIUM CHLORIDE 0.9 % IV SOLN
Freq: Once | INTRAVENOUS | Status: AC
  Administered 2016-05-12: 12:00:00 via INTRAVENOUS

## 2016-05-12 MED ORDER — PEGFILGRASTIM 6 MG/0.6ML ~~LOC~~ PSKT
6.0000 mg | PREFILLED_SYRINGE | Freq: Once | SUBCUTANEOUS | Status: AC
  Administered 2016-05-12: 6 mg via SUBCUTANEOUS
  Filled 2016-05-12: qty 0.6

## 2016-05-12 MED ORDER — DEXAMETHASONE SODIUM PHOSPHATE 10 MG/ML IJ SOLN
10.0000 mg | Freq: Once | INTRAMUSCULAR | Status: AC
  Administered 2016-05-12: 10 mg via INTRAVENOUS
  Filled 2016-05-12: qty 1

## 2016-05-12 MED ORDER — SODIUM CHLORIDE 0.9 % IV SOLN: 10.0000 mg | Freq: Once | INTRAVENOUS | Status: DC

## 2016-05-12 MED ORDER — DIPHENHYDRAMINE HCL 50 MG/ML IJ SOLN
25.0000 mg | Freq: Once | INTRAMUSCULAR | Status: AC
  Administered 2016-05-12: 25 mg via INTRAVENOUS
  Filled 2016-05-12: qty 1

## 2016-05-12 NOTE — Patient Instructions (Signed)
[facility] Discharge Instructions for Patients Receiving Chemotherapy   Beginning January 23rd 2017 lab work for the Cancer Center will be done in the  Main lab at [facility] on 1st floor. If you have a lab appointment with the Cancer Center please come in thru the  Main Entrance and check in at the main information desk   Today you received the following chemotherapy agents   To help prevent nausea and vomiting after your treatment, we encourage you to take your nausea medication     If you develop nausea and vomiting, or diarrhea that is not controlled by your medication, call the clinic.  The clinic phone number is [phone]. Office hours are Monday-Friday 8:30am-5:00pm.  BELOW ARE SYMPTOMS THAT SHOULD BE REPORTED IMMEDIATELY:  *FEVER GREATER THAN 101.0 F  *CHILLS WITH OR WITHOUT FEVER  NAUSEA AND VOMITING THAT IS NOT CONTROLLED WITH YOUR NAUSEA MEDICATION  *UNUSUAL SHORTNESS OF BREATH  *UNUSUAL BRUISING OR BLEEDING  TENDERNESS IN MOUTH AND THROAT WITH OR WITHOUT PRESENCE OF ULCERS  *URINARY PROBLEMS  *BOWEL PROBLEMS  UNUSUAL RASH Items with * indicate a potential emergency and should be followed up as soon as possible. If you have an emergency after office hours please contact your primary care physician or go to the nearest emergency department.  Please call the clinic during office hours if you have any questions or concerns.   You may also contact the Patient Navigator at [phone] should you have any questions or need assistance in obtaining follow up care.      Resources For Cancer Patients and their Caregivers ? American Cancer Society: Can assist with transportation, wigs, general needs, runs Look Good Feel Better.        1-888-227-6333 ? Cancer Care: Provides financial assistance, online support groups, medication/co-pay assistance.  1-800-813-HOPE (4673) ? [facility] Assists [place] cancer  patients and their families through emotional , educational and financial support.  336-427-4357 ? [place] DSS Where to apply for food stamps, Medicaid and utility assistance. 336-342-1394 ? [facility]: Transportation to medical appointments. 336-347-2287 ? Social Security Administration: May apply for disability if have a Stage IV cancer. 336-342-7796 1-800-772-1213 ? [place] Aging, Disability and Transit Services: Assists with nutrition, care and transit needs. 336-349-2343         

## 2016-05-12 NOTE — Progress Notes (Signed)
Patient complaining of increased pain here recently. Patient complaining of more pain in the right shoulder and bilateral knees. MD notified and will instruct patient to take his pain medicine every 4 hours instead of 6 hours. Call us and let us know if this works and [employer] will add a long-acting pain medicine.  Micheal Davis presents today for injection per MD orders. Lupron 7.5mg  administered IM  in right upper buttocks Administration without incident. Patient tolerated well.  Chemotherapy given today per orders. Patient tolerated it well without problems. Vitals stable and discharged home ambulatory from clinic.follow up as scheduled.

## 2016-05-19 ENCOUNTER — Other Ambulatory Visit (HOSPITAL_COMMUNITY): Payer: Self-pay | Admitting: Oncology

## 2016-05-19 ENCOUNTER — Encounter (HOSPITAL_BASED_OUTPATIENT_CLINIC_OR_DEPARTMENT_OTHER): Payer: Medicare Other | Admitting: Hematology

## 2016-05-19 ENCOUNTER — Encounter (HOSPITAL_COMMUNITY): Payer: Self-pay | Admitting: Hematology

## 2016-05-19 ENCOUNTER — Telehealth (HOSPITAL_COMMUNITY): Payer: Self-pay | Admitting: *Deleted

## 2016-05-19 VITALS — BP 140/77 | HR 67 | Temp 97.7°F | Resp 20 | Wt 261.2 lb

## 2016-05-19 DIAGNOSIS — C7951 Secondary malignant neoplasm of bone: Secondary | ICD-10-CM

## 2016-05-19 DIAGNOSIS — G893 Neoplasm related pain (acute) (chronic): Secondary | ICD-10-CM

## 2016-05-19 DIAGNOSIS — C61 Malignant neoplasm of prostate: Secondary | ICD-10-CM | POA: Diagnosis not present

## 2016-05-19 MED ORDER — OXYCODONE HCL ER 10 MG PO T12A: 10.0000 mg | EXTENDED_RELEASE_TABLET | Freq: Two times a day (BID) | ORAL | 0 refills | Status: DC

## 2016-05-19 MED ORDER — OXYCODONE HCL 5 MG PO TABS: 5.0000 mg | ORAL_TABLET | ORAL | 0 refills | Status: DC | PRN

## 2016-05-19 MED ORDER — OXYCODONE-ACETAMINOPHEN 10-325 MG PO TABS: 1.0000 | ORAL_TABLET | Freq: Four times a day (QID) | ORAL | 0 refills | Status: DC | PRN

## 2016-05-19 MED ORDER — MORPHINE SULFATE ER 30 MG PO TBCR: 30.0000 mg | EXTENDED_RELEASE_TABLET | Freq: Two times a day (BID) | ORAL | 0 refills | Status: DC

## 2016-05-19 NOTE — Progress Notes (Signed)
1110   Instructed patient to begin taking calcium 1200mg  and vitamin D 1000mg  daily.  Patient verbalizes understanding about medication and consent for xgeva was obtained.  All questions answered.

## 2016-05-19 NOTE — Patient Instructions (Addendum)
[place] at [facility] Discharge Instructions  RECOMMENDATIONS MADE BY THE CONSULTANT AND ANY TEST RESULTS WILL BE SENT TO YOUR REFERRING PHYSICIAN.  You were seen today by Dr. Lestine Box Lupron injections every 4 weeks You will begin [place] See We have refilled your pain medication Follow up April 2nd with chemo and lab work  See Amy up front for appointments   Thank you for choosing [place] at [facility] to provide your oncology and hematology care.  To afford each patient quality time with our provider, please arrive at least 15 minutes before your scheduled appointment time.    If you have a lab appointment with the [facility] please come in thru the  Main Entrance and check in at the main information desk  You need to re-schedule your appointment should you arrive 10 or more minutes late.  We strive to give you quality time with our providers, and arriving late affects you and other patients whose appointments are after yours.  Also, if you no show three or more times for appointments you may be dismissed from the clinic at the providers discretion.     Again, thank you for choosing [facility].  Our hope is that these requests will decrease the amount of time that you wait before being seen by our physicians.       _____________________________________________________________  Should you have questions after your visit to [facility], please contact our office at [phone] between the hours of 8:30 a.m. and 4:30 p.m.  Voicemails left after 4:30 p.m. will not be returned until the following business day.  For prescription refill requests, have your pharmacy contact our office.       Resources For Cancer Patients and their Caregivers ? American Cancer Society: Can assist with transportation, wigs, general needs, runs Look Good Feel Better.        (301)446-9695 ? Cancer Care: Provides  financial assistance, online support groups, medication/co-pay assistance.  1-800-813-HOPE 579-806-2303) ? [facility] Assists [place] cancer patients and their families through emotional , educational and financial support.  513-536-3963 ? [place] DSS Where to apply for food stamps, Medicaid and utility assistance. 435-603-2992 ? [facility]: Transportation to medical appointments. 609 234 8381 ? Social Security Administration: May apply for disability if have a Stage IV cancer. 559-516-8609 4097915992 ? [employer], Disability and Transit Services: Assists with nutrition, care and transit needs. [facility] Support Programs: @10RELATIVEDAYS @ > Cancer Support Group  2nd Tuesday of the month 1pm-2pm, Journey Room  > Creative Journey  3rd Tuesday of the month 1130am-1pm, Journey Room  > Look Good Feel Better  1st Wednesday of the month 10am-12 noon, Journey Room (Call [place] to register [phone])

## 2016-05-19 NOTE — Progress Notes (Signed)
Micheal Davis  HEMATOLOGY ONCOLOGY PROGRESS NOTE  Date of service: .05/19/2016  Patient Care Team: [name], MD as PCP - General (Family Medicine)  CC: Follow-up for prostate cancer. Patient wants to follow up for continued treatment here [place].  Diagnosis: Metastatic castrate resistant prostate cancer  Current Treatment:  -Cabazitaxel with G-CSF support -monthly lupron -added Xgeva  SUMMARY OF ONCOLOGIC HISTORY:   Prostate cancer metastatic to bone ([place])   10/01/2012 Initial Diagnosis    Prostate biopsied with highest Gleason score of 9 seen and the lowest score was 7.      10/04/2012 - 05/16/2013 Chemotherapy    Depo-Lupron and Casodex initiated      05/16/2013 -  Chemotherapy    Depo-Lupron monthly continued      05/16/2013 Progression    Progression by PSA elevation      05/16/2013 - 10/22/2014 Chemotherapy    Abiraterone and prednisone initiated in conjunction with ongoing Depo-Lupron.  Denosumab also ongoing.      10/23/2014 Progression    PSA increasing from 0.2- 1.6 in less than 6 months.        10/23/2014 - 01/30/2015 Chemotherapy    Enzalutamide and Prednisone (5 mg in AM and 2.5 mg in PM)      01/30/2015 Imaging    Bone scan- New focus of intense activity in right proximal humerus.  Interim increase in activity over left hip.      01/30/2015 Progression    Bone scan reveals new disease in right humerus consistent with progression of disease      01/31/2015 Imaging    Right humerus xray- blastic foci in proximal right humeral metaphysis and over right mid-humeral diaphysis.  No evidence of fracture      07/06/2015 Progression    Progression in multiple bones especially L hip and femurs      07/06/2015 Imaging    Bone scan- progressive multifocal osseous metastases in the right proximal femora and distal femoral shafts.  Stable update in bilateral ribs suspicious for small rib metastases      07/16/2015 - 07/31/2015 Radiation Therapy   Left femur 30 Gy in 10 fractions by Dr. Tammi Klippel      11/23/2015 - 01/04/2016 Chemotherapy    The patient had pegfilgrastim (NEULASTA ONPRO KIT) injection 6 mg, 6 mg, Subcutaneous, Once, 3 of 7 cycles  DOCEtaxel (TAXOTERE) 180 mg in dextrose 5 % 250 mL chemo infusion, 75 mg/m2 = 180 mg, Intravenous,  Once, 3 of 7 cycles Dose modification: 64 mg/m2 (original dose 75 mg/m2, Cycle 2, Reason: Dose not tolerated)  pegfilgrastim (NEULASTA ONPRO KIT) injection 6 mg, 6 mg, Subcutaneous, Once, 0 of 4 cycles  cabazitaxel (JEVTANA) 60 mg in dextrose 5 % 250 mL chemo infusion, 25 mg/m2, Intravenous,  Once, 0 of 4 cycles  for chemotherapy treatment.        11/30/2015 Adverse Reaction    Diarrhea (secondary to chemotherapy) and dehydration requiring IV fluids      12/14/2015 Treatment Plan Change    Docetaxel dose reduced by 15%      12/31/2015 Procedure    Port placed by Dr. Arnoldo Morale      01/28/2016 -  Chemotherapy    Cabazitaxel (Jevtana)       03/27/2016 Imaging    CT Chest, Abdomen, and Pelvis with contrast 1. Diffuse sclerotic osseous metastatic disease in the chest, abdomen, and pelvis without acute fracture identified. 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 These appear  chronic. 2. The prostate gland is normal in size and no adenopathy is currently identified. 3. On a prior MRI of 10/04/2012, there was a posterior right hepatic lobe lesion. This lesion is not currently visible on today' s CT. This could be due to differences in cons acuity between CT or MRI, or resolution of the lesion. 4. Coronary, aortic arch, and branch vessel atherosclerotic vascular disease. Aortoiliac atherosclerotic vascular disease. 5. Single bilateral renal cysts.       INTERVAL HISTORY:  Patient is a for follow-up of his metastatic castrate resistant prostate cancer. He was last seen by Dr.Shadad on 04/28/2016. He would like to transfer his ongoing treatments to  [place]:    10 Point review of systems of done and is negative except as noted above.  . Past Medical History:  Diagnosis Date  . Diabetes mellitus without complication ([place])   . Hypertension   . Prostate cancer ([place]) 09/05/2015  . Prostate cancer metastatic to bone ([place]) 09/05/2015  . Sleep apnea   . Thyroid disease     . Past Surgical History:  Procedure Laterality Date  . CATARACT EXTRACTION    . PORTACATH PLACEMENT Left 12/31/2015   Procedure: INSERTION PORT-A-CATH LEFT SUBCLAVIAN;  Surgeon: Aviva Signs, MD;  Location: [facility];  Service: General;  Laterality: Left;  . REPLACEMENT TOTAL KNEE Left     . Social History  Substance Use Topics  . Smoking status: Never Smoker  . Smokeless tobacco: Never Used  . Alcohol use Yes     Comment: 1 beer each month    ALLERGIES:  has No Known Allergies.  MEDICATIONS:  Current Outpatient Prescriptions  Medication Sig Dispense Refill  . atenolol (TENORMIN) 100 MG tablet Take 100 mg by mouth daily.    Micheal Davis atorvastatin (LIPITOR) 20 MG tablet Take 10 mg by mouth daily.    . Cabazitaxel (JEVTANA IV) Inject into the vein. Every 3 weeks    . calcium carbonate (OS-CAL - DOSED IN MG OF ELEMENTAL CALCIUM) 1250 (500 Ca) MG tablet Take 1 tablet by mouth daily with breakfast.     . dexamethasone (DECADRON) 4 MG tablet     . diltiazem (CARDIZEM CD) 300 MG 24 hr capsule Take 300 mg by mouth daily.    Micheal Davis esomeprazole (NEXIUM) 20 MG capsule Take 1 capsule (20 mg total) by mouth daily at 12 noon. 30 capsule 5  . Glucosamine-Chondroit-Vit C-Mn (GLUCOSAMINE 1500 COMPLEX PO) Take 1 tablet by mouth 2 (two) times daily.     Micheal Davis levothyroxine (SYNTHROID, LEVOTHROID) 25 MCG tablet Take 25 mcg by mouth daily before breakfast.    . lidocaine-prilocaine (EMLA) cream Apply to affected area once 30 g 3  . magic mouthwash w/lidocaine SOLN 1 part of each of the following: Benadryl 12.475m /572m Viscous lidocaine 2%, Maalox. Swish and  swallow 5 mL QID. (Patient taking differently: Take 5 mLs by mouth 4 (four) times daily as needed for mouth pain. 1 part of each of the following: Benadryl 12.75m52m75ml70miscous lidocaine 2%, Maalox. Swish and swallow 5 mL QID.) 240 mL 1  . metFORMIN (GLUCOPHAGE) 500 MG tablet Take 500 mg by mouth 2 (two) times daily with a meal.    . Multiple Vitamin (MULTIVITAMIN WITH MINERALS) TABS tablet Take 1 tablet by mouth daily.    . Omega-3 Fatty Acids (FISH OIL) 1000 MG CAPS Take 1 capsule by mouth daily.    . ondansetron (ZOFRAN) 8 MG tablet Take 1 tablet (  8 mg total) by mouth 2 (two) times daily as needed (Nausea or vomiting). 30 tablet 1  . oxyCODONE-acetaminophen (PERCOCET) 10-325 MG tablet Take 1 tablet by mouth every 6 (six) hours as needed for pain. 90 tablet 0  . Pegfilgrastim (NEULASTA ONPRO [place]) Inject into the skin. Every 21 days    . potassium chloride SA (K-DUR,KLOR-CON) 20 MEQ tablet Take 1 tablet (20 mEq total) by mouth daily. 90 tablet 1  . predniSONE (DELTASONE) 5 MG tablet Take 1 tablet (5 mg total) by mouth 2 (two) times daily with a meal. 180 tablet 0  . prochlorperazine (COMPAZINE) 10 MG tablet Take 1 tablet (10 mg total) by mouth every 6 (six) hours as needed (Nausea or vomiting). 30 tablet 1  . tamsulosin (FLOMAX) 0.4 MG CAPS capsule Take 0.8 mg by mouth daily.     Micheal Davis triamterene-hydrochlorothiazide (DYAZIDE) 37.5-25 MG capsule Take 1 capsule by mouth daily.    Micheal Davis triamterene-hydrochlorothiazide (MAXZIDE-25) 37.5-25 MG tablet Take 1 tablet by mouth daily. for high blood pressure  3  . vitamin E 400 UNIT capsule Take 400 Units by mouth 2 (two) times daily.      No current facility-administered medications for this visit.    Facility-Administered Medications Ordered in Other Visits  Medication Dose Route Frequency Provider Last Rate Last Dose  . leuprolide (LUPRON) injection 7.5 mg  7.5 mg Intramuscular Q28 days [facility], PA-C        PHYSICAL EXAMINATION: ECOG PERFORMANCE  STATUS: 2 - Symptomatic, <50% confined to bed  . Vitals:   05/19/16 1004  BP: 140/77  Pulse: 67  Resp: 20  Temp: 97.7 F (36.5 C)    Filed Weights   05/19/16 1004  Weight: 261 lb 3.2 oz (118.5 kg)   .Body mass index is 37.48 kg/m.  GENERAL:alert, in no acute distress and comfortable SKIN: no acute rashes, no significant lesions EYES: conjunctiva are pink and non-injected, sclera anicteric OROPHARYNX: MMM, no exudates, no oropharyngeal erythema or ulceration NECK: supple, no JVD LYMPH:  no palpable lymphadenopathy in the cervical, axillary or inguinal regions LUNGS: clear to auscultation b/l with normal respiratory effort HEART: regular rate & rhythm ABDOMEN:  normoactive bowel sounds , non tender, not distended. Extremity: no pedal edema PSYCH: alert & oriented x 3 with fluent speech NEURO: no focal motor/sensory deficits  LABORATORY DATA:   I have reviewed the data as listed  . CBC Latest Ref Rng & Units 05/12/2016 04/21/2016 04/14/2016  WBC 4.0 - 10.5 K/uL 7.0 7.6 11.6(H)  Hemoglobin 13.0 - 17.0 g/dL 12.1(L) 11.9(L) 12.2(L)  Hematocrit 39.0 - 52.0 % 35.2(L) 36.1(L) 35.6(L)  Platelets 150 - 400 K/uL 178 176 148(L)    . CMP Latest Ref Rng & Units 05/12/2016 04/21/2016 04/14/2016  Glucose 65 - 99 mg/dL 150(H) 127(H) 133(H)  BUN 6 - 20 mg/dL '17 17 13  ' Creatinine 0.61 - 1.24 mg/dL 0.83 0.82 0.83  Sodium 135 - 145 mmol/L 137 139 138  Potassium 3.5 - 5.1 mmol/L 4.1 4.0 3.9  Chloride 101 - 111 mmol/L 102 102 101  CO2 22 - 32 mmol/L '25 28 27  ' Calcium 8.9 - 10.3 mg/dL 9.3 9.5 9.6  Total Protein 6.5 - 8.1 g/dL 6.0(L) 6.3(L) 6.4(L)  Total Bilirubin 0.3 - 1.2 mg/dL 0.6 0.6 0.8  Alkaline Phos 38 - 126 U/L 99 106 113  AST 15 - 41 U/L '23 21 22  ' ALT 17 - 63 U/L '18 19 20     ' RADIOGRAPHIC STUDIES: I have  personally reviewed the radiological images as listed and agreed with the findings in the report. No results found.  ASSESSMENT & PLAN:   71 year old gentleman with    1. Castration resistant metastatic prostate cancer with disease to the bone. His initial diagnosis was in August 2014 when he presented with elevated PSA 123 and a prostate biopsy showed a Gleason score 4+5 = 9 with high-volume disease. At that time he had advanced disease metastasis to the bone and possibly adenopathy.  He has been on multiple treatments in the past initially with combined androgen deprivation and subsequently developed castration resistant disease rather quickly. He was treated with Zytiga and then [place]. He received 3 cycles of Taxotere chemotherapy although his PSA continues to rise.   He is currently on Jevtana chemotherapy and completed 6 cycles.  PSA has dropped to 68 on 04/14/2016.  Plan -No acute prohibitive toxicities from his last cycle of chemotherapy. -We'll continue his Cabazitaxel q21d with G-CSF support till progression - due on 06/02/2016. He will need a clinic follow-up and labs on the same day. -Will need to continue androgen deprivation therapy with Lupron lifelong. Currently on monthly Lupron might consider switching to every 3 monthly. -Given his significant bone metastases and bone pain will start the patient on Xgeva q4weeks . -Patient counseled to take at least thousand units of vitamin D daily. Might need additional vitamin D replacement for bone health and especially if he is vitamin D deficient. Recheck 25 hydroxy vitamin D on next visit  2. Cancer related pain. Patient was using increasing amounts of Percocet for uncontrolled bone pains and has been needing it every 4 hours. Plan -I did an extensive pain evaluation today. -Will switch him to OxyContin 10 mg by mouth twice a daily and have oxycodone available when necessary for breakthrough pain. -he was recommended to ensure compliance with a CPAP for sleep apnea to avoid excessive sedation related issues   return to clinic with labs on 06/02/2016 for continued treatment  I spent 30 minutes  counseling the patient face to face. The total time spent in the appointment was 40 minutes and more than 50% was on counseling and direct patient cares.    Sullivan Lone MD [place] AAHIVMS [facility] Hematology/Oncology Physician [facility]  (Office):       [phone] (Work cell):  [phone] (Fax):           [phone]

## 2016-05-19 NOTE — Telephone Encounter (Signed)
Patient states he cannot afford the new pain medications ( Oxy IR and Oxycontin 10 mg). They are over 200 dollars a month. He wasn't to go back to what he was taking (percocet). He will pick up new prescription for Percocet on Friday.

## 2016-05-19 NOTE — Telephone Encounter (Signed)
Ok.  I have filled Percocet and also MS Contin.  MS Contin should be much more affordable.  If too expensive, he can forego the MS Contin.  Robynn Pane, PA-C 05/19/2016 7:55 PM

## 2016-06-02 ENCOUNTER — Other Ambulatory Visit (HOSPITAL_COMMUNITY): Payer: Medicare Other

## 2016-06-02 ENCOUNTER — Encounter (HOSPITAL_COMMUNITY): Payer: Medicare Other | Attending: Hematology & Oncology

## 2016-06-02 ENCOUNTER — Encounter (HOSPITAL_BASED_OUTPATIENT_CLINIC_OR_DEPARTMENT_OTHER): Payer: Medicare Other | Admitting: Oncology

## 2016-06-02 VITALS — BP 151/77 | HR 51 | Resp 16

## 2016-06-02 DIAGNOSIS — E291 Testicular hypofunction: Secondary | ICD-10-CM

## 2016-06-02 DIAGNOSIS — Z5111 Encounter for antineoplastic chemotherapy: Secondary | ICD-10-CM | POA: Diagnosis not present

## 2016-06-02 DIAGNOSIS — C61 Malignant neoplasm of prostate: Secondary | ICD-10-CM

## 2016-06-02 DIAGNOSIS — C7951 Secondary malignant neoplasm of bone: Secondary | ICD-10-CM | POA: Diagnosis not present

## 2016-06-02 LAB — CBC WITH DIFFERENTIAL/PLATELET
Basophils Absolute: 0.1 10*3/uL (ref 0.0–0.1)
Basophils Relative: 1 %
EOS ABS: 0.1 10*3/uL (ref 0.0–0.7)
EOS PCT: 2 %
HCT: 35.2 % — ABNORMAL LOW (ref 39.0–52.0)
Hemoglobin: 12 g/dL — ABNORMAL LOW (ref 13.0–17.0)
LYMPHS ABS: 1.1 10*3/uL (ref 0.7–4.0)
Lymphocytes Relative: 13 %
MCH: 31.9 pg (ref 26.0–34.0)
MCHC: 34.1 g/dL (ref 30.0–36.0)
MCV: 93.6 fL (ref 78.0–100.0)
Monocytes Absolute: 0.7 10*3/uL (ref 0.1–1.0)
Monocytes Relative: 9 %
Neutro Abs: 6 10*3/uL (ref 1.7–7.7)
Neutrophils Relative %: 75 %
PLATELETS: 199 10*3/uL (ref 150–400)
RBC: 3.76 MIL/uL — AB (ref 4.22–5.81)
RDW: 14.2 % (ref 11.5–15.5)
WBC: 8 10*3/uL (ref 4.0–10.5)

## 2016-06-02 LAB — COMPREHENSIVE METABOLIC PANEL
ALT: 17 U/L (ref 17–63)
ANION GAP: 8 (ref 5–15)
AST: 21 U/L (ref 15–41)
Albumin: 3.9 g/dL (ref 3.5–5.0)
Alkaline Phosphatase: 106 U/L (ref 38–126)
BUN: 16 mg/dL (ref 6–20)
CO2: 28 mmol/L (ref 22–32)
Calcium: 9.4 mg/dL (ref 8.9–10.3)
Chloride: 101 mmol/L (ref 101–111)
Creatinine, Ser: 0.88 mg/dL (ref 0.61–1.24)
GFR calc non Af Amer: >60 mL/min (ref >60)
Glucose, Bld: 126 mg/dL — ABNORMAL HIGH (ref 65–99)
POTASSIUM: 3.7 mmol/L (ref 3.5–5.1)
SODIUM: 137 mmol/L (ref 135–145)
Total Bilirubin: 0.7 mg/dL (ref 0.3–1.2)
Total Protein: 6.1 g/dL — ABNORMAL LOW (ref 6.5–8.1)

## 2016-06-02 MED ORDER — SODIUM CHLORIDE 0.9% FLUSH
10.0000 mL | INTRAVENOUS | Status: DC | PRN
  Administered 2016-06-02: 10 mL
  Filled 2016-06-02: qty 10

## 2016-06-02 MED ORDER — DIPHENHYDRAMINE HCL 50 MG/ML IJ SOLN
INTRAMUSCULAR | Status: AC
  Filled 2016-06-02: qty 1

## 2016-06-02 MED ORDER — DEXAMETHASONE SODIUM PHOSPHATE 10 MG/ML IJ SOLN
INTRAMUSCULAR | Status: AC
  Filled 2016-06-02: qty 1

## 2016-06-02 MED ORDER — SODIUM CHLORIDE 0.9 % IV SOLN
Freq: Once | INTRAVENOUS | Status: AC
  Administered 2016-06-02: 10:00:00 via INTRAVENOUS

## 2016-06-02 MED ORDER — FAMOTIDINE IN NACL 20-0.9 MG/50ML-% IV SOLN
20.0000 mg | Freq: Once | INTRAVENOUS | Status: AC
  Administered 2016-06-02: 20 mg via INTRAVENOUS

## 2016-06-02 MED ORDER — DEXAMETHASONE SODIUM PHOSPHATE 100 MG/10ML IJ SOLN: 10.0000 mg | Freq: Once | INTRAMUSCULAR | Status: DC

## 2016-06-02 MED ORDER — FAMOTIDINE IN NACL 20-0.9 MG/50ML-% IV SOLN
INTRAVENOUS | Status: AC
  Filled 2016-06-02: qty 50

## 2016-06-02 MED ORDER — DIPHENHYDRAMINE HCL 50 MG/ML IJ SOLN
25.0000 mg | Freq: Once | INTRAMUSCULAR | Status: AC
  Administered 2016-06-02: 25 mg via INTRAVENOUS

## 2016-06-02 MED ORDER — DEXAMETHASONE SODIUM PHOSPHATE 10 MG/ML IJ SOLN
10.0000 mg | Freq: Once | INTRAMUSCULAR | Status: AC
  Administered 2016-06-02: 10 mg via INTRAVENOUS

## 2016-06-02 MED ORDER — DEXTROSE 5 % IV SOLN
20.0000 mg/m2 | Freq: Once | INTRAVENOUS | Status: AC
  Administered 2016-06-02: 48 mg via INTRAVENOUS
  Filled 2016-06-02: qty 4.8

## 2016-06-02 MED ORDER — HEPARIN SOD (PORK) LOCK FLUSH 100 UNIT/ML IV SOLN
500.0000 [IU] | Freq: Once | INTRAVENOUS | Status: AC | PRN
  Administered 2016-06-02: 500 [IU]
  Filled 2016-06-02: qty 5

## 2016-06-02 MED ORDER — PEGFILGRASTIM 6 MG/0.6ML ~~LOC~~ PSKT
6.0000 mg | PREFILLED_SYRINGE | Freq: Once | SUBCUTANEOUS | Status: AC
  Administered 2016-06-02: 6 mg via SUBCUTANEOUS

## 2016-06-02 NOTE — Patient Instructions (Signed)
[facility] Discharge Instructions for Patients Receiving Chemotherapy   Beginning January 23rd 2017 lab work for the Cancer Center will be done in the  Main lab at [facility] on 1st floor. If you have a lab appointment with the Cancer Center please come in thru the  Main Entrance and check in at the main information desk   Today you received the following chemotherapy agents   To help prevent nausea and vomiting after your treatment, we encourage you to take your nausea medication     If you develop nausea and vomiting, or diarrhea that is not controlled by your medication, call the clinic.  The clinic phone number is [phone]. Office hours are Monday-Friday 8:30am-5:00pm.  BELOW ARE SYMPTOMS THAT SHOULD BE REPORTED IMMEDIATELY:  *FEVER GREATER THAN 101.0 F  *CHILLS WITH OR WITHOUT FEVER  NAUSEA AND VOMITING THAT IS NOT CONTROLLED WITH YOUR NAUSEA MEDICATION  *UNUSUAL SHORTNESS OF BREATH  *UNUSUAL BRUISING OR BLEEDING  TENDERNESS IN MOUTH AND THROAT WITH OR WITHOUT PRESENCE OF ULCERS  *URINARY PROBLEMS  *BOWEL PROBLEMS  UNUSUAL RASH Items with * indicate a potential emergency and should be followed up as soon as possible. If you have an emergency after office hours please contact your primary care physician or go to the nearest emergency department.  Please call the clinic during office hours if you have any questions or concerns.   You may also contact the Patient Navigator at [phone] should you have any questions or need assistance in obtaining follow up care.      Resources For Cancer Patients and their Caregivers ? American Cancer Society: Can assist with transportation, wigs, general needs, runs Look Good Feel Better.        1-888-227-6333 ? Cancer Care: Provides financial assistance, online support groups, medication/co-pay assistance.  1-800-813-HOPE (4673) ? [facility] Assists [place] cancer  patients and their families through emotional , educational and financial support.  336-427-4357 ? [place] DSS Where to apply for food stamps, Medicaid and utility assistance. 336-342-1394 ? [facility]: Transportation to medical appointments. 336-347-2287 ? Social Security Administration: May apply for disability if have a Stage IV cancer. 336-342-7796 1-800-772-1213 ? [place] Aging, Disability and Transit Services: Assists with nutrition, care and transit needs. 336-349-2343         

## 2016-06-02 NOTE — Progress Notes (Signed)
Micheal Labrum, MD [place] [id number]  Prostate cancer metastatic to bone [facility])  CURRENT THERAPY: Jevtana beginning on 01/28/2016.  INTERVAL HISTORY: Micheal Davis 71 y.o. 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    Prostate cancer metastatic to bone ([place])   10/01/2012 Initial Diagnosis    Prostate biopsied with highest Gleason score of 9 seen and the lowest score was 7.      10/04/2012 - 05/16/2013 Chemotherapy    Depo-Lupron and Casodex initiated      05/16/2013 -  Chemotherapy    Depo-Lupron monthly continued      05/16/2013 Progression    Progression by PSA elevation      05/16/2013 - 10/22/2014 Chemotherapy    Abiraterone and prednisone initiated in conjunction with ongoing Depo-Lupron.  Denosumab also ongoing.      10/23/2014 Progression    PSA increasing from 0.2- 1.6 in less than 6 months.        10/23/2014 - 01/30/2015 Chemotherapy    Enzalutamide and Prednisone (5 mg in AM and 2.5 mg in PM)      01/30/2015 Imaging    Bone scan- New focus of intense activity in right proximal humerus.  Interim increase in activity over left hip.      01/30/2015 Progression    Bone scan reveals new disease in right humerus consistent with progression of disease      01/31/2015 Imaging    Right humerus xray- blastic foci in proximal right humeral metaphysis and over right mid-humeral diaphysis.  No evidence of fracture      07/06/2015 Progression    Progression in multiple bones especially L hip and femurs      07/06/2015 Imaging    Bone scan- progressive multifocal osseous metastases in the right proximal femora and distal femoral shafts.  Stable update in bilateral ribs suspicious for small rib metastases      07/16/2015 - 07/31/2015 Radiation Therapy      Left femur 30 Gy in 10 fractions by Dr. Tammi Klippel      11/23/2015 - 01/04/2016 Chemotherapy    The patient had pegfilgrastim (NEULASTA ONPRO KIT) injection 6 mg, 6 mg, Subcutaneous, Once, 3 of 7 cycles  DOCEtaxel (TAXOTERE) 180 mg in dextrose 5 % 250 mL chemo infusion, 75 mg/m2 = 180 mg, Intravenous,  Once, 3 of 7 cycles Dose modification: 64 mg/m2 (original dose 75 mg/m2, Cycle 2, Reason: Dose not tolerated)  pegfilgrastim (NEULASTA ONPRO KIT) injection 6 mg, 6 mg, Subcutaneous, Once, 0 of 4 cycles  cabazitaxel (JEVTANA) 60 mg in dextrose 5 % 250 mL chemo infusion, 25 mg/m2, Intravenous,  Once, 0 of 4 cycles  for chemotherapy treatment.        11/30/2015 Adverse Reaction    Diarrhea (secondary to chemotherapy) and dehydration requiring IV fluids      12/14/2015 Treatment Plan Change    Docetaxel dose reduced by 15%      12/31/2015 Procedure    Port placed by Dr. Arnoldo Morale      01/28/2016 -  Chemotherapy    Cabazitaxel (Jevtana)       03/27/2016 Imaging    CT Chest, Abdomen, and Pelvis with contrast 1. Diffuse sclerotic osseous metastatic disease in the chest, abdomen, and pelvis without acute fracture identified. 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 These appear chronic. 2. The prostate gland is normal in size and no adenopathy is currently identified. 3. On a prior MRI of 10/04/2012, there was a posterior right hepatic lobe lesion. This lesion is not currently visible on today' s CT. This could be due to differences in cons acuity between CT or MRI, or resolution of the lesion. 4. Coronary, aortic arch, and branch vessel atherosclerotic vascular disease. Aortoiliac atherosclerotic vascular disease. 5. Single bilateral renal cysts.      04/16/2016 Imaging    Bone scan- Findings consist with progressive metastatic disease. Activity over the proximal right humerus and proximal bilateral femurs are worrisome for the possible  development of pathologic fractures.       He is doing well from an oncology perspective.  He is tolerating therapy well without any issues related to nausea or vomiting.  Fatigue remains an issue but is very mild.  He continues to work part-time.  Pain is much better with the addition of a long-acting narcotic, MS Contin.  He notes that his Percocet intake has significantly reduced.    He notes easy bruising of his distal extremities secondary to minor trauma.  Review of Systems  Constitutional: Negative.  Negative for chills, fever and weight loss.  HENT: Negative.   Eyes: Negative.   Respiratory: Negative.  Negative for cough.   Cardiovascular: Negative.  Negative for chest pain.  Gastrointestinal: Negative.  Negative for blood in stool, constipation, diarrhea, melena, nausea and vomiting.  Genitourinary: Negative.   Musculoskeletal: Positive for joint pain (B/L knee pain).  Skin: Negative.   Neurological: Negative.  Negative for weakness.  Endo/Heme/Allergies: Bruises/bleeds easily.  Psychiatric/Behavioral: Negative.     Past Medical History:  Diagnosis Date  . Diabetes mellitus without complication ([place])   . Hypertension   . Prostate cancer ([place]) 09/05/2015  . Prostate cancer metastatic to bone ([place]) 09/05/2015  . Sleep apnea   . Thyroid disease     Past Surgical History:  Procedure Laterality Date  . CATARACT EXTRACTION    . PORTACATH PLACEMENT Left 12/31/2015   Procedure: INSERTION PORT-A-CATH LEFT SUBCLAVIAN;  Surgeon: Aviva Signs, MD;  Location: [facility];  Service: General;  Laterality: Left;  . REPLACEMENT TOTAL KNEE Left     No family history on file.  Social History   Social History  . Marital status: Married    Spouse name: N/A  . Number of children: N/A  . Years of education: N/A   Social History Main Topics  . Smoking status: Never Smoker  . Smokeless tobacco: Never Used  . Alcohol use Yes     Comment: 1 beer each month  . Drug use: No  . Sexual  activity: No     Comment: married   Other Topics Concern  . Not on file   Social History Narrative  . No narrative on file     PHYSICAL EXAMINATION  ECOG PERFORMANCE STATUS: 1 - Symptomatic but completely ambulatory  Vitals:   06/02/16 0905  BP: (!) 163/78  Pulse: 62  Resp: 16  Temp: 97.9 F (36.6 C)    GENERAL:alert, no distress, well nourished, well developed, comfortable, cooperative, obese, smiling and accompanied by wife. SKIN: skin color, texture, turgor are normal, no rashes or significant lesions, positive for: Ecchymoses of distal extremities. HEAD: Normocephalic, No masses, lesions, tenderness or abnormalities EYES: normal, EOMI, Conjunctiva are pink and non-injected EARS: External ears normal OROPHARYNX:lips, buccal mucosa, and tongue normal and mucous  membranes are moist  NECK: supple, trachea midline LYMPH:  no palpable lymphadenopathy BREAST:not examined LUNGS: clear to auscultation and percussion HEART: regular rate & rhythm, no murmurs, no gallops, S1 normal and S2 normal ABDOMEN:abdomen soft and normal bowel sounds BACK: Back symmetric, no curvature. EXTREMITIES:less then 2 second capillary refill, no joint deformities, effusion, or inflammation, no skin discoloration, no cyanosis  NEURO: alert & oriented x 3 with fluent speech, no focal motor/sensory deficits, gait normal   LABORATORY DATA: CBC    Component Value Date/Time   WBC 7.0 05/12/2016 1103   RBC 3.76 (L) 05/12/2016 1103   HGB 12.1 (L) 05/12/2016 1103   HCT 35.2 (L) 05/12/2016 1103   PLT 178 05/12/2016 1103   MCV 93.6 05/12/2016 1103   MCH 32.2 05/12/2016 1103   MCHC 34.4 05/12/2016 1103   RDW 14.5 05/12/2016 1103   LYMPHSABS 0.8 05/12/2016 1103   MONOABS 0.5 05/12/2016 1103   EOSABS 0.1 05/12/2016 1103   BASOSABS 0.0 05/12/2016 1103      Chemistry      Component Value Date/Time   NA 137 05/12/2016 1103   K 4.1 05/12/2016 1103   CL 102 05/12/2016 1103   CO2 25 05/12/2016 1103    BUN 17 05/12/2016 1103   CREATININE 0.83 05/12/2016 1103      Component Value Date/Time   CALCIUM 9.3 05/12/2016 1103   ALKPHOS 99 05/12/2016 1103   AST 23 05/12/2016 1103   ALT 18 05/12/2016 1103   BILITOT 0.6 05/12/2016 1103      Lab Results  Component Value Date   PSA 68.32 (H) 04/14/2016   PSA 96.28 (H) 03/10/2016   PSA 88.36 (H) 02/18/2016     PENDING LABS:   RADIOGRAPHIC STUDIES:  No results found.   PATHOLOGY:    ASSESSMENT AND PLAN:  Prostate cancer metastatic to bone (H[facility]) Stage IV prostate cancer, metastatic to bone with history of osteonecrosis of jaw leading to the discontinuation of Denosumab.  Currently on Depo-Lupron monthly and systemic chemotherapy with Carbazitaxel beginning on 01/28/2016.  Previous treatments include: Casodex, Zytiga + Prednisone, and Xtandi.  Additionally, he recently complete palliative XRT to left femur.    Oncology history is updated.  Pre-treatment labs today: CBC diff, CMET, PSA.  I personally reviewed and went over laboratory results with the patient.  The results are noted within this dictation.  He reports an improvement in his pain with recent change in pain medication:  1. MS Contin  2. Percocet He reports his pain is "manageable."  Patient has been seen by Dr. Shadad in [place] for a second opinion: 1. Castration resistant metastatic prostate cancer with disease to the bone. His initial diagnosis was in August 2014 when he presented with elevated PSA 123 and a prostate biopsy showed a Gleason score 4+5 = 9 with high-volume disease. At that time he had advanced disease metastasis to the bone and possibly adenopathy. He has been on multiple treatments in the past initially with combined androgen deprivation and subsequently developed castration resistant disease rather quickly. He was treated with Zytiga and then Xtandi. He received 3 cycles of Taxotere chemotherapy although his PSA continues to rise.  He is currently on  Jevtana chemotherapy and completed 5 cycles. His PSA has dropped slightly after cycle 4. His CT scan and bone scan were personally reviewed and discussed with the patient. I have recommended continuing Jevtana chemotherapy for at least 5 more cycles of therapy if he continues to tolerated. Different salvage therapy will   be used after that if he develops progression of disease. Given the fact that his bone disease only, Trudi Ida will be an option as well. He elected to continue to get his care at any [facility] and I'm happy to see him in the future as needed. 2. Androgen deprivation: He is on Lupron to be continued indefinitely. 3. Follow-up: Will be as needed in the future.   Depo-Lupron is due on 06/12/2016.  Continue with Depo-Lupron monthly as was done in [place], at [facility].  He will be starting Xgeva on 06/12/2016 as well.  Supportive therapy plan is built.  Return as scheduled for follow-up and cycle #8 of chemotherapy.   ORDERS PLACED FOR THIS ENCOUNTER: No orders of the defined types were placed in this encounter.   MEDICATIONS PRESCRIBED THIS ENCOUNTER: No orders of the defined types were placed in this encounter.   THERAPY PLAN:  Continue with Jevtana for at least 10 cycles.  Treatment options moving forward include Xofigo.  All questions were answered. The patient knows to call the clinic with any problems, questions or concerns. We can certainly see the patient much sooner if necessary.  Patient and plan discussed with Dr. Twana First and she is in agreement with the aforementioned.   This note is electronically signed by: Doy Mince 06/02/2016 9:19 AM

## 2016-06-02 NOTE — Progress Notes (Signed)
Labs reviewed with MD. Proceed with treatment.  Chemotherapy given today per orders.  Patient tolerated well, no issues. Marland KitchenCristy Davis arrived today for [facility] neulasta on body injector. See MAR for administration details. Injector in place and engaged with green light indicator on flashing. Tolerated application with out problems.  Vitals stable and discharged home from clinic ambulatory. Follow up as scheduled

## 2016-06-02 NOTE — Assessment & Plan Note (Addendum)
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  Currently on Depo-Lupron monthly and systemic chemotherapy with Carbazitaxel beginning on 01/28/2016.  Previous treatments include: Casodex, Zytiga + Prednisone, and Xtandi.  Additionally, he recently complete palliative XRT to left femur.    Oncology history is updated.  Pre-treatment labs today: CBC diff, CMET, PSA.  I personally reviewed and went over laboratory results with the patient.  The results are noted within this dictation.  He reports an improvement in his pain with recent change in pain medication:  1. MS Contin  2. Percocet He reports his pain is "manageable."  Patient has been seen by Dr. Alen Blew in [facility] for a second opinion: 1. Castration resistant metastatic prostate cancer with disease to the bone. His initial diagnosis was in August 2014 when he presented with elevated PSA 123 and a prostate biopsy showed a Gleason score 4+5 = 9 with high-volume disease. At that time he had advanced disease metastasis to the bone and possibly adenopathy. He has been on multiple treatments in the past initially with combined androgen deprivation and subsequently developed castration resistant disease rather quickly. He was treated with Zytiga and then Tr[place]. He received 3 cycles of Taxotere chemotherapy although his PSA continues to rise.  He is currently on Jevtana chemotherapy and completed 5 cycles. His PSA has dropped slightly after cycle 4. His CT scan and bone scan were personally reviewed and discussed with the patient. I have recommended continuing Jevtana chemotherapy for at least 5 more cycles of therapy if he continues to tolerated. Different salvage therapy will be used after that if he develops progression of disease. Given the fact that his bone disease only, Trudi Ida will be an option as well. He elected to continue to get his care at any [facility]  and I'm happy to see him in the future as needed. 2. Androgen deprivation: He is on Lupron to be continued indefinitely. 3. Follow-up: Will be as needed in the future.   Depo-Lupron is due on 06/12/2016.  Continue with Depo-Lupron monthly as was done in [place], at [facility].  He will be starting Xgeva on 06/12/2016 as well.  Supportive therapy plan is built.  Return as scheduled for follow-up and cycle #8 of chemotherapy.

## 2016-06-02 NOTE — Patient Instructions (Addendum)
[place] at [facility] Discharge Instructions  RECOMMENDATIONS MADE BY THE CONSULTANT AND ANY TEST RESULTS WILL BE SENT TO YOUR REFERRING PHYSICIAN.  You were seen today by Kirby Crigler PA-C. Labs and treatment today. Lupron and Xgeva injections 4/12. Return in 3 weeks for follow up.    Thank you for choosing [place] at [facility] to provide your oncology and hematology care.  To afford each patient quality time with our provider, please arrive at least 15 minutes before your scheduled appointment time.    If you have a lab appointment with the [facility] please come in thru the  Main Entrance and check in at the main information desk  You need to re-schedule your appointment should you arrive 10 or more minutes late.  We strive to give you quality time with our providers, and arriving late affects you and other patients whose appointments are after yours.  Also, if you no show three or more times for appointments you may be dismissed from the clinic at the providers discretion.     Again, thank you for choosing [facility].  Our hope is that these requests will decrease the amount of time that you wait before being seen by our physicians.       _____________________________________________________________  Should you have questions after your visit to [facility], please contact our office at [phone] between the hours of 8:30 a.m. and 4:30 p.m.  Voicemails left after 4:30 p.m. will not be returned until the following business day.  For prescription refill requests, have your pharmacy contact our office.       Resources For Cancer Patients and their Caregivers ? American Cancer Society: Can assist with transportation, wigs, general needs, runs Look Good Feel Better.        7808826524 ? Cancer Care: Provides financial assistance, online support groups, medication/co-pay assistance.   1-800-813-HOPE 8732118695) ? [place] Assists [place] cancer patients and their families through emotional , educational and financial support.  3011450432 ? [place] DSS Where to apply for food stamps, Medicaid and utility assistance. 7022989888 ? RCATS: Transportation to medical appointments. 418-025-6304 ? Social Security Administration: May apply for disability if have a Stage IV cancer. 947-298-2478 [phone] ? [employer], Disability and Transit Services: Assists with nutrition, care and transit needs. [place] Support Programs: @10RELATIVEDAYS @ > Cancer Support Group  2nd Tuesday of the month 1pm-2pm, Journey Room  > Creative Journey  3rd Tuesday of the month 1130am-1pm, Journey Room  > Look Good Feel Better  1st Wednesday of the month 10am-12 noon, Journey Room (Call [place] to register [phone])

## 2016-06-04 ENCOUNTER — Encounter (HOSPITAL_COMMUNITY): Payer: Self-pay | Admitting: *Deleted

## 2016-06-12 ENCOUNTER — Encounter (HOSPITAL_COMMUNITY): Payer: Medicare Other

## 2016-06-12 ENCOUNTER — Encounter (HOSPITAL_BASED_OUTPATIENT_CLINIC_OR_DEPARTMENT_OTHER): Payer: Medicare Other

## 2016-06-12 VITALS — BP 152/76 | HR 65 | Temp 98.3°F | Resp 18

## 2016-06-12 DIAGNOSIS — C61 Malignant neoplasm of prostate: Secondary | ICD-10-CM

## 2016-06-12 DIAGNOSIS — C7951 Secondary malignant neoplasm of bone: Secondary | ICD-10-CM

## 2016-06-12 DIAGNOSIS — Z5111 Encounter for antineoplastic chemotherapy: Secondary | ICD-10-CM | POA: Diagnosis present

## 2016-06-12 LAB — CBC WITH DIFFERENTIAL/PLATELET
BASOS ABS: 0.1 10*3/uL (ref 0.0–0.1)
Basophils Relative: 0 %
Eosinophils Absolute: 0.1 10*3/uL (ref 0.0–0.7)
Eosinophils Relative: 0 %
HEMATOCRIT: 36.7 % — AB (ref 39.0–52.0)
Hemoglobin: 12.4 g/dL — ABNORMAL LOW (ref 13.0–17.0)
LYMPHS PCT: 7 %
Lymphs Abs: 1.4 10*3/uL (ref 0.7–4.0)
MCH: 32.1 pg (ref 26.0–34.0)
MCHC: 33.8 g/dL (ref 30.0–36.0)
MCV: 95.1 fL (ref 78.0–100.0)
MONO ABS: 0.7 10*3/uL (ref 0.1–1.0)
Monocytes Relative: 3 %
NEUTROS ABS: 17.5 10*3/uL — AB (ref 1.7–7.7)
Neutrophils Relative %: 89 %
Platelets: 227 10*3/uL (ref 150–400)
RBC: 3.86 MIL/uL — ABNORMAL LOW (ref 4.22–5.81)
RDW: 14.5 % (ref 11.5–15.5)
WBC: 19.7 10*3/uL — ABNORMAL HIGH (ref 4.0–10.5)

## 2016-06-12 LAB — COMPREHENSIVE METABOLIC PANEL
ALBUMIN: 4 g/dL (ref 3.5–5.0)
ALK PHOS: 133 U/L — AB (ref 38–126)
ALT: 20 U/L (ref 17–63)
ANION GAP: 8 (ref 5–15)
AST: 21 U/L (ref 15–41)
BILIRUBIN TOTAL: 0.6 mg/dL (ref 0.3–1.2)
BUN: 13 mg/dL (ref 6–20)
CALCIUM: 9.4 mg/dL (ref 8.9–10.3)
CO2: 28 mmol/L (ref 22–32)
CREATININE: 1.06 mg/dL (ref 0.61–1.24)
Chloride: 104 mmol/L (ref 101–111)
GFR calc Af Amer: >60 mL/min (ref >60)
GFR calc non Af Amer: >60 mL/min (ref >60)
GLUCOSE: 118 mg/dL — AB (ref 65–99)
Potassium: 4.8 mmol/L (ref 3.5–5.1)
Sodium: 140 mmol/L (ref 135–145)
TOTAL PROTEIN: 6.1 g/dL — AB (ref 6.5–8.1)

## 2016-06-12 MED ORDER — LEUPROLIDE ACETATE 7.5 MG IM KIT
7.5000 mg | PACK | INTRAMUSCULAR | Status: DC
  Administered 2016-06-12: 7.5 mg via INTRAMUSCULAR
  Filled 2016-06-12: qty 7.5

## 2016-06-12 MED ORDER — DENOSUMAB 120 MG/1.7ML ~~LOC~~ SOLN
120.0000 mg | Freq: Once | SUBCUTANEOUS | Status: AC
  Administered 2016-06-12: 120 mg via SUBCUTANEOUS
  Filled 2016-06-12: qty 1.7

## 2016-06-12 NOTE — Patient Instructions (Signed)
[place] at [facility] Discharge Instructions  RECOMMENDATIONS MADE BY THE CONSULTANT AND ANY TEST RESULTS WILL BE SENT TO YOUR REFERRING PHYSICIAN.  Xgeva 120 mg injection given as ordered. Lupron 7.5 mg injection given as ordered. Return as scheduled.  Thank you for choosing [place] at [facility] to provide your oncology and hematology care.  To afford each patient quality time with our provider, please arrive at least 15 minutes before your scheduled appointment time.    If you have a lab appointment with the [facility] please come in thru the  Main Entrance and check in at the main information desk  You need to re-schedule your appointment should you arrive 10 or more minutes late.  We strive to give you quality time with our providers, and arriving late affects you and other patients whose appointments are after yours.  Also, if you no show three or more times for appointments you may be dismissed from the clinic at the providers discretion.     Again, thank you for choosing [facility].  Our hope is that these requests will decrease the amount of time that you wait before being seen by our physicians.       _____________________________________________________________  Should you have questions after your visit to [facility], please contact our office at [phone] between the hours of 8:30 a.m. and 4:30 p.m.  Voicemails left after 4:30 p.m. will not be returned until the following business day.  For prescription refill requests, have your pharmacy contact our office.       Resources For Cancer Patients and their Caregivers ? American Cancer Society: Can assist with transportation, wigs, general needs, runs Look Good Feel Better.        4341120643 ? Cancer Care: Provides financial assistance, online support groups, medication/co-pay assistance.  1-800-813-HOPE (571)431-1989) ? [place] Assists [place] cancer patients and their families through emotional , educational and financial support.  (931)561-3029 ? [place] DSS Where to apply for food stamps, Medicaid and utility assistance. (719)124-7580 ? RCATS: Transportation to medical appointments. (612)540-6894 ? Social Security Administration: May apply for disability if have a Stage IV cancer. (205)873-3726 714 741 5930 ? [employer], Disability and Transit Services: Assists with nutrition, care and transit needs. [place] Support Programs: @10RELATIVEDAYS @ > Cancer Support Group  2nd Tuesday of the month 1pm-2pm, Journey Room  > Creative Journey  3rd Tuesday of the month 1130am-1pm, Journey Room  > Look Good Feel Better  1st Wednesday of the month 10am-12 noon, Journey Room (Call [place] to register [phone])

## 2016-06-12 NOTE — Progress Notes (Signed)
Micheal Davis presents today for injection per MD orders. Xgeva 120 mg administered SQ in right Abdomen. Administration without incident. Patient tolerated well.  Micheal Davis presents today for injection per MD orders. Lupron 7.5 mg administered IM in right upper outer Gluteal. Administration without incident. Patient tolerated well.

## 2016-06-23 ENCOUNTER — Encounter (HOSPITAL_COMMUNITY): Payer: Self-pay

## 2016-06-23 ENCOUNTER — Encounter (HOSPITAL_BASED_OUTPATIENT_CLINIC_OR_DEPARTMENT_OTHER): Payer: Medicare Other | Admitting: Oncology

## 2016-06-23 ENCOUNTER — Other Ambulatory Visit (HOSPITAL_COMMUNITY): Payer: Medicare Other

## 2016-06-23 ENCOUNTER — Encounter (HOSPITAL_BASED_OUTPATIENT_CLINIC_OR_DEPARTMENT_OTHER): Payer: Medicare Other

## 2016-06-23 VITALS — BP 136/80 | HR 51 | Temp 98.3°F | Resp 18 | Wt 253.0 lb

## 2016-06-23 DIAGNOSIS — R197 Diarrhea, unspecified: Secondary | ICD-10-CM

## 2016-06-23 DIAGNOSIS — C61 Malignant neoplasm of prostate: Secondary | ICD-10-CM

## 2016-06-23 DIAGNOSIS — Z5111 Encounter for antineoplastic chemotherapy: Secondary | ICD-10-CM | POA: Diagnosis not present

## 2016-06-23 DIAGNOSIS — C7951 Secondary malignant neoplasm of bone: Secondary | ICD-10-CM

## 2016-06-23 DIAGNOSIS — G893 Neoplasm related pain (acute) (chronic): Secondary | ICD-10-CM | POA: Diagnosis not present

## 2016-06-23 LAB — COMPREHENSIVE METABOLIC PANEL
ALT: 19 U/L (ref 17–63)
AST: 24 U/L (ref 15–41)
Albumin: 3.9 g/dL (ref 3.5–5.0)
Alkaline Phosphatase: 91 U/L (ref 38–126)
Anion gap: 8 (ref 5–15)
BUN: 14 mg/dL (ref 6–20)
CHLORIDE: 102 mmol/L (ref 101–111)
CO2: 25 mmol/L (ref 22–32)
CREATININE: 0.81 mg/dL (ref 0.61–1.24)
Calcium: 8.7 mg/dL — ABNORMAL LOW (ref 8.9–10.3)
GFR calc non Af Amer: >60 mL/min (ref >60)
Glucose, Bld: 156 mg/dL — ABNORMAL HIGH (ref 65–99)
Potassium: 3.8 mmol/L (ref 3.5–5.1)
SODIUM: 135 mmol/L (ref 135–145)
Total Bilirubin: 0.7 mg/dL (ref 0.3–1.2)
Total Protein: 6.2 g/dL — ABNORMAL LOW (ref 6.5–8.1)

## 2016-06-23 LAB — CBC WITH DIFFERENTIAL/PLATELET
Basophils Absolute: 0 10*3/uL (ref 0.0–0.1)
Basophils Relative: 0 %
Eosinophils Absolute: 0 10*3/uL (ref 0.0–0.7)
Eosinophils Relative: 1 %
HCT: 35.8 % — ABNORMAL LOW (ref 39.0–52.0)
Hemoglobin: 12.2 g/dL — ABNORMAL LOW (ref 13.0–17.0)
LYMPHS ABS: 0.8 10*3/uL (ref 0.7–4.0)
Lymphocytes Relative: 12 %
MCH: 31.9 pg (ref 26.0–34.0)
MCHC: 34.1 g/dL (ref 30.0–36.0)
MCV: 93.7 fL (ref 78.0–100.0)
Monocytes Absolute: 0.5 10*3/uL (ref 0.1–1.0)
Monocytes Relative: 7 %
Neutro Abs: 5.7 10*3/uL (ref 1.7–7.7)
Neutrophils Relative %: 80 %
Platelets: 190 10*3/uL (ref 150–400)
RBC: 3.82 MIL/uL — AB (ref 4.22–5.81)
RDW: 14.6 % (ref 11.5–15.5)
WBC: 7.1 10*3/uL (ref 4.0–10.5)

## 2016-06-23 MED ORDER — DEXTROSE 5 % IV SOLN
20.0000 mg/m2 | Freq: Once | INTRAVENOUS | Status: AC
  Administered 2016-06-23: 48 mg via INTRAVENOUS
  Filled 2016-06-23: qty 4.8

## 2016-06-23 MED ORDER — HEPARIN SOD (PORK) LOCK FLUSH 100 UNIT/ML IV SOLN
500.0000 [IU] | Freq: Once | INTRAVENOUS | Status: AC | PRN
  Administered 2016-06-23: 500 [IU]
  Filled 2016-06-23: qty 5

## 2016-06-23 MED ORDER — PEGFILGRASTIM 6 MG/0.6ML ~~LOC~~ PSKT
6.0000 mg | PREFILLED_SYRINGE | Freq: Once | SUBCUTANEOUS | Status: AC
  Administered 2016-06-23: 6 mg via SUBCUTANEOUS
  Filled 2016-06-23: qty 0.6

## 2016-06-23 MED ORDER — SODIUM CHLORIDE 0.9 % IV SOLN: 10.0000 mg | Freq: Once | INTRAVENOUS | Status: DC

## 2016-06-23 MED ORDER — DIPHENHYDRAMINE HCL 50 MG/ML IJ SOLN
25.0000 mg | Freq: Once | INTRAMUSCULAR | Status: AC
Start: 2016-06-23 — End: 2016-06-23
  Administered 2016-06-23: 25 mg via INTRAVENOUS
  Filled 2016-06-23: qty 1

## 2016-06-23 MED ORDER — SODIUM CHLORIDE 0.9 % IV SOLN
Freq: Once | INTRAVENOUS | Status: AC
  Administered 2016-06-23: 11:00:00 via INTRAVENOUS

## 2016-06-23 MED ORDER — SODIUM CHLORIDE 0.9% FLUSH
10.0000 mL | INTRAVENOUS | Status: DC | PRN
  Administered 2016-06-23: 10 mL
  Filled 2016-06-23: qty 10

## 2016-06-23 MED ORDER — DEXAMETHASONE SODIUM PHOSPHATE 10 MG/ML IJ SOLN
10.0000 mg | Freq: Once | INTRAMUSCULAR | Status: AC
  Administered 2016-06-23: 10 mg via INTRAVENOUS
  Filled 2016-06-23: qty 1

## 2016-06-23 MED ORDER — FAMOTIDINE IN NACL 20-0.9 MG/50ML-% IV SOLN
20.0000 mg | Freq: Once | INTRAVENOUS | Status: AC
Start: 2016-06-23 — End: 2016-06-23
  Administered 2016-06-23: 20 mg via INTRAVENOUS
  Filled 2016-06-23: qty 50

## 2016-06-23 MED ORDER — MORPHINE SULFATE ER 30 MG PO TBCR: 30.0000 mg | EXTENDED_RELEASE_TABLET | Freq: Two times a day (BID) | ORAL | 0 refills | Status: DC

## 2016-06-23 NOTE — Progress Notes (Signed)
Micheal Davis discharged with Neulasta on-pro applied to right arm and green indicator light flashing. Reviewed time that pt can remove Neulasta tomorrow and pt verbalized understanding

## 2016-06-23 NOTE — Progress Notes (Signed)
[name] tolerated chemo tx with Neulasta on-pro well without complaints or incident. Labs reviewed prior to administering chemotherapy. VSS upon discharge. Pt discharged self ambulatory in satisfactory condition accompanied by his wife

## 2016-06-23 NOTE — Patient Instructions (Signed)
[place] at [facility] Discharge Instructions  RECOMMENDATIONS MADE BY THE CONSULTANT AND ANY TEST RESULTS WILL BE SENT TO YOUR REFERRING PHYSICIAN.  You were seen today by Dr. Twana First See Amy up front for appointments   Thank you for choosing [place] at [facility] to provide your oncology and hematology care.  To afford each patient quality time with our provider, please arrive at least 15 minutes before your scheduled appointment time.    If you have a lab appointment with the [facility] please come in thru the  Main Entrance and check in at the main information desk  You need to re-schedule your appointment should you arrive 10 or more minutes late.  We strive to give you quality time with our providers, and arriving late affects you and other patients whose appointments are after yours.  Also, if you no show three or more times for appointments you may be dismissed from the clinic at the providers discretion.     Again, thank you for choosing [facility].  Our hope is that these requests will decrease the amount of time that you wait before being seen by our physicians.       _____________________________________________________________  Should you have questions after your visit to [facility], please contact our office at [phone] between the hours of 8:30 a.m. and 4:30 p.m.  Voicemails left after 4:30 p.m. will not be returned until the following business day.  For prescription refill requests, have your pharmacy contact our office.       Resources For Cancer Patients and their Caregivers ? American Cancer Society: Can assist with transportation, wigs, general needs, runs Look Good Feel Better.        416-857-5695 ? Cancer Care: Provides financial assistance, online support groups, medication/co-pay assistance.  1-800-813-HOPE 667-717-8094) ? [place] Assists  [place] cancer patients and their families through emotional , educational and financial support.  806-540-8699 ? [place] DSS Where to apply for food stamps, Medicaid and utility assistance. (671)670-7597 ? RCATS: Transportation to medical appointments. (878) 444-8269 ? Social Security Administration: May apply for disability if have a Stage IV cancer. ([phone] ? [employer], Disability and Transit Services: Assists with nutrition, care and transit needs. [place] Support Programs: @10RELATIVEDAYS @ > Cancer Support Group  2nd Tuesday of the month 1pm-2pm, Journey Room  > Creative Journey  3rd Tuesday of the month 1130am-1pm, Journey Room  > Look Good Feel Better  1st Wednesday of the month 10am-12 noon, Journey Room (Call [place] to register [phone])

## 2016-06-23 NOTE — Patient Instructions (Signed)
[facility] Discharge Instructions for Patients Receiving Chemotherapy   Beginning January 23rd 2017 lab work for the Cancer Center will be done in the  Main lab at [place] on 1st floor. If you have a lab appointment with the Cancer Center please come in thru the  Main Entrance and check in at the main information desk   Today you received the following chemotherapy agents Jevtana as well as Neulasta on-pro. Follow-up as scheduled. Call clinic for any questions or concerns  To help prevent nausea and vomiting after your treatment, we encourage you to take your nausea medication   If you develop nausea and vomiting, or diarrhea that is not controlled by your medication, call the clinic.  The clinic phone number is [phone]. Office hours are Monday-Friday 8:30am-5:00pm.  BELOW ARE SYMPTOMS THAT SHOULD BE REPORTED IMMEDIATELY:  *FEVER GREATER THAN 101.0 F  *CHILLS WITH OR WITHOUT FEVER  NAUSEA AND VOMITING THAT IS NOT CONTROLLED WITH YOUR NAUSEA MEDICATION  *UNUSUAL SHORTNESS OF BREATH  *UNUSUAL BRUISING OR BLEEDING  TENDERNESS IN MOUTH AND THROAT WITH OR WITHOUT PRESENCE OF ULCERS  *URINARY PROBLEMS  *BOWEL PROBLEMS  UNUSUAL RASH Items with * indicate a potential emergency and should be followed up as soon as possible. If you have an emergency after office hours please contact your primary care physician or go to the nearest emergency department.  Please call the clinic during office hours if you have any questions or concerns.   You may also contact the Patient Navigator at [phone] should you have any questions or need assistance in obtaining follow up care.      Resources For Cancer Patients and their Caregivers ? American Cancer Society: Can assist with transportation, wigs, general needs, runs Look Good Feel Better.        1-888-227-6333 ? Cancer Care: Provides financial assistance, online support groups, medication/co-pay  assistance.  1-800-813-HOPE (4673) ? [facility] Assists [place] cancer patients and their families through emotional , educational and financial support.  336-427-4357 ? [place] DSS Where to apply for food stamps, Medicaid and utility assistance. 336-342-1394 ? RCATS: Transportation to medical appointments. 336-347-2287 ? Social Security Administration: May apply for disability if have a Stage IV cancer. 336-342-7796 1-800-772-1213 ? [place] Aging, Disability and Transit Services: Assists with nutrition, care and transit needs. [phone]         
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
## 2016-06-24 LAB — PSA: PSA: 62.25 ng/mL — ABNORMAL HIGH (ref 0.00–4.00)

## 2016-07-10 ENCOUNTER — Encounter (HOSPITAL_COMMUNITY): Payer: Medicare Other

## 2016-07-10 ENCOUNTER — Encounter (HOSPITAL_COMMUNITY): Payer: Medicare Other | Attending: Oncology

## 2016-07-10 VITALS — BP 147/82 | HR 58 | Temp 98.4°F | Resp 18

## 2016-07-10 DIAGNOSIS — I10 Essential (primary) hypertension: Secondary | ICD-10-CM | POA: Insufficient documentation

## 2016-07-10 DIAGNOSIS — Z5111 Encounter for antineoplastic chemotherapy: Secondary | ICD-10-CM

## 2016-07-10 DIAGNOSIS — E039 Hypothyroidism, unspecified: Secondary | ICD-10-CM | POA: Diagnosis not present

## 2016-07-10 DIAGNOSIS — E7800 Pure hypercholesterolemia, unspecified: Secondary | ICD-10-CM | POA: Insufficient documentation

## 2016-07-10 DIAGNOSIS — C7951 Secondary malignant neoplasm of bone: Secondary | ICD-10-CM

## 2016-07-10 DIAGNOSIS — G4730 Sleep apnea, unspecified: Secondary | ICD-10-CM | POA: Insufficient documentation

## 2016-07-10 DIAGNOSIS — E119 Type 2 diabetes mellitus without complications: Secondary | ICD-10-CM | POA: Diagnosis not present

## 2016-07-10 DIAGNOSIS — Z7984 Long term (current) use of oral hypoglycemic drugs: Secondary | ICD-10-CM | POA: Insufficient documentation

## 2016-07-10 DIAGNOSIS — Z9221 Personal history of antineoplastic chemotherapy: Secondary | ICD-10-CM | POA: Diagnosis not present

## 2016-07-10 DIAGNOSIS — N281 Cyst of kidney, acquired: Secondary | ICD-10-CM | POA: Diagnosis not present

## 2016-07-10 DIAGNOSIS — Z923 Personal history of irradiation: Secondary | ICD-10-CM | POA: Insufficient documentation

## 2016-07-10 DIAGNOSIS — C61 Malignant neoplasm of prostate: Secondary | ICD-10-CM

## 2016-07-10 DIAGNOSIS — G893 Neoplasm related pain (acute) (chronic): Secondary | ICD-10-CM | POA: Diagnosis not present

## 2016-07-10 LAB — COMPREHENSIVE METABOLIC PANEL
ALBUMIN: 3.9 g/dL (ref 3.5–5.0)
ALK PHOS: 85 U/L (ref 38–126)
ALT: 19 U/L (ref 17–63)
AST: 19 U/L (ref 15–41)
Anion gap: 6 (ref 5–15)
BUN: 14 mg/dL (ref 6–20)
CALCIUM: 8.9 mg/dL (ref 8.9–10.3)
CO2: 29 mmol/L (ref 22–32)
CREATININE: 0.82 mg/dL (ref 0.61–1.24)
Chloride: 105 mmol/L (ref 101–111)
GFR calc Af Amer: >60 mL/min (ref >60)
GFR calc non Af Amer: >60 mL/min (ref >60)
GLUCOSE: 138 mg/dL — AB (ref 65–99)
Potassium: 4.2 mmol/L (ref 3.5–5.1)
SODIUM: 140 mmol/L (ref 135–145)
Total Bilirubin: 0.7 mg/dL (ref 0.3–1.2)
Total Protein: 6.2 g/dL — ABNORMAL LOW (ref 6.5–8.1)

## 2016-07-10 LAB — CBC WITH DIFFERENTIAL/PLATELET
BASOS PCT: 0 %
Basophils Absolute: 0 10*3/uL (ref 0.0–0.1)
EOS ABS: 0.1 10*3/uL (ref 0.0–0.7)
Eosinophils Relative: 1 %
HEMATOCRIT: 36.2 % — AB (ref 39.0–52.0)
HEMOGLOBIN: 11.9 g/dL — AB (ref 13.0–17.0)
Lymphocytes Relative: 12 %
Lymphs Abs: 1.2 10*3/uL (ref 0.7–4.0)
MCH: 31.5 pg (ref 26.0–34.0)
MCHC: 32.9 g/dL (ref 30.0–36.0)
MCV: 95.8 fL (ref 78.0–100.0)
Monocytes Absolute: 0.6 10*3/uL (ref 0.1–1.0)
Monocytes Relative: 6 %
NEUTROS PCT: 81 %
Neutro Abs: 7.8 10*3/uL — ABNORMAL HIGH (ref 1.7–7.7)
Platelets: 163 10*3/uL (ref 150–400)
RBC: 3.78 MIL/uL — AB (ref 4.22–5.81)
RDW: 15 % (ref 11.5–15.5)
WBC: 9.7 10*3/uL (ref 4.0–10.5)

## 2016-07-10 MED ORDER — LEUPROLIDE ACETATE 7.5 MG IM KIT
7.5000 mg | PACK | INTRAMUSCULAR | Status: DC
  Administered 2016-07-10: 7.5 mg via INTRAMUSCULAR
  Filled 2016-07-10: qty 7.5

## 2016-07-10 MED ORDER — DENOSUMAB 120 MG/1.7ML ~~LOC~~ SOLN
120.0000 mg | Freq: Once | SUBCUTANEOUS | Status: AC
  Administered 2016-07-10: 120 mg via SUBCUTANEOUS
  Filled 2016-07-10: qty 1.7

## 2016-07-10 NOTE — Progress Notes (Signed)
Micheal Davis presents today for injection per MD orders. Xgeva 120 mg administered SQ in left Abdomen. Administration without incident. Patient tolerated well. Micheal Davis presents today for injection per MD orders. Lupron 7.5 mg administered IM in left upper outer Gluteal. Administration without incident. Patient tolerated well. Stable and ambulatory on discharge home with wife.

## 2016-07-10 NOTE — Patient Instructions (Signed)
[place] at [facility] Discharge Instructions  RECOMMENDATIONS MADE BY THE CONSULTANT AND ANY TEST RESULTS WILL BE SENT TO YOUR REFERRING PHYSICIAN.  Xgeva 120 mg injection given as ordered. Lupron 7.5 mg injection given as ordered. Return as scheduled.  Thank you for choosing [place] at [facility] to provide your oncology and hematology care.  To afford each patient quality time with our provider, please arrive at least 15 minutes before your scheduled appointment time.    If you have a lab appointment with the [place] please come in thru the  Main Entrance and check in at the main information desk  You need to re-schedule your appointment should you arrive 10 or more minutes late.  We strive to give you quality time with our providers, and arriving late affects you and other patients whose appointments are after yours.  Also, if you no show three or more times for appointments you may be dismissed from the clinic at the providers discretion.     Again, thank you for choosing [facility].  Our hope is that these requests will decrease the amount of time that you wait before being seen by our physicians.       _____________________________________________________________  Should you have questions after your visit to [facility], please contact our office at [phone] between the hours of 8:30 a.m. and 4:30 p.m.  Voicemails left after 4:30 p.m. will not be returned until the following business day.  For prescription refill requests, have your pharmacy contact our office.       Resources For Cancer Patients and their Caregivers ? American Cancer Society: Can assist with transportation, wigs, general needs, runs Look Good Feel Better.        952-270-1486 ? Cancer Care: Provides financial assistance, online support groups, medication/co-pay assistance.  1-800-813-HOPE (952)636-1185) ? [place] Assists [place] cancer patients and their families through emotional , educational and financial support.  (905)011-6337 ? [place] DSS Where to apply for food stamps, Medicaid and utility assistance. 607-146-6821 ? RCATS: Transportation to medical appointments. 219-233-5607 ? Social Security Administration: May apply for disability if have a Stage IV cancer. (213)615-8482 365-239-0759 ? [employer], Disability and Transit Services: Assists with nutrition, care and transit needs. [place] Support Programs: @10RELATIVEDAYS @ > Cancer Support Group  2nd Tuesday of the month 1pm-2pm, Journey Room  > Creative Journey  3rd Tuesday of the month 1130am-1pm, Journey Room  > Look Good Feel Better  1st Wednesday of the month 10am-12 noon, Journey Room (Call [place] to register [phone])

## 2016-07-14 ENCOUNTER — Encounter (HOSPITAL_COMMUNITY): Payer: Self-pay

## 2016-07-14 ENCOUNTER — Encounter (HOSPITAL_BASED_OUTPATIENT_CLINIC_OR_DEPARTMENT_OTHER): Payer: Medicare Other

## 2016-07-14 ENCOUNTER — Encounter (HOSPITAL_BASED_OUTPATIENT_CLINIC_OR_DEPARTMENT_OTHER): Payer: Medicare Other | Admitting: Oncology

## 2016-07-14 ENCOUNTER — Other Ambulatory Visit (HOSPITAL_COMMUNITY): Payer: Medicare Other

## 2016-07-14 VITALS — BP 132/76 | HR 57 | Temp 97.7°F | Resp 18

## 2016-07-14 VITALS — BP 142/57 | HR 67 | Temp 97.7°F | Resp 18 | Wt 251.6 lb

## 2016-07-14 DIAGNOSIS — Z5111 Encounter for antineoplastic chemotherapy: Secondary | ICD-10-CM

## 2016-07-14 DIAGNOSIS — E039 Hypothyroidism, unspecified: Secondary | ICD-10-CM | POA: Diagnosis not present

## 2016-07-14 DIAGNOSIS — N281 Cyst of kidney, acquired: Secondary | ICD-10-CM | POA: Diagnosis not present

## 2016-07-14 DIAGNOSIS — C7951 Secondary malignant neoplasm of bone: Secondary | ICD-10-CM

## 2016-07-14 DIAGNOSIS — I10 Essential (primary) hypertension: Secondary | ICD-10-CM | POA: Diagnosis not present

## 2016-07-14 DIAGNOSIS — C61 Malignant neoplasm of prostate: Secondary | ICD-10-CM

## 2016-07-14 DIAGNOSIS — Z5189 Encounter for other specified aftercare: Secondary | ICD-10-CM

## 2016-07-14 DIAGNOSIS — E7800 Pure hypercholesterolemia, unspecified: Secondary | ICD-10-CM | POA: Diagnosis not present

## 2016-07-14 LAB — PSA: PSA: 66.58 ng/mL — ABNORMAL HIGH (ref 0.00–4.00)

## 2016-07-14 MED ORDER — DIPHENHYDRAMINE HCL 50 MG/ML IJ SOLN
25.0000 mg | Freq: Once | INTRAMUSCULAR | Status: AC
  Administered 2016-07-14: 25 mg via INTRAVENOUS
  Filled 2016-07-14: qty 1

## 2016-07-14 MED ORDER — DEXAMETHASONE SODIUM PHOSPHATE 10 MG/ML IJ SOLN
10.0000 mg | Freq: Once | INTRAMUSCULAR | Status: AC
  Administered 2016-07-14: 10 mg via INTRAVENOUS
  Filled 2016-07-14: qty 1

## 2016-07-14 MED ORDER — HEPARIN SOD (PORK) LOCK FLUSH 100 UNIT/ML IV SOLN
500.0000 [IU] | Freq: Once | INTRAVENOUS | Status: AC | PRN
  Administered 2016-07-14: 500 [IU]
  Filled 2016-07-14: qty 5

## 2016-07-14 MED ORDER — DEXTROSE 5 % IV SOLN
20.0000 mg/m2 | Freq: Once | INTRAVENOUS | Status: AC
  Administered 2016-07-14: 48 mg via INTRAVENOUS
  Filled 2016-07-14: qty 4.8

## 2016-07-14 MED ORDER — PEGFILGRASTIM 6 MG/0.6ML ~~LOC~~ PSKT
6.0000 mg | PREFILLED_SYRINGE | Freq: Once | SUBCUTANEOUS | Status: AC
  Administered 2016-07-14: 6 mg via SUBCUTANEOUS
  Filled 2016-07-14: qty 0.6

## 2016-07-14 MED ORDER — SODIUM CHLORIDE 0.9 % IV SOLN
Freq: Once | INTRAVENOUS | Status: AC
  Administered 2016-07-14: 10:00:00 via INTRAVENOUS

## 2016-07-14 MED ORDER — FAMOTIDINE IN NACL 20-0.9 MG/50ML-% IV SOLN
20.0000 mg | Freq: Once | INTRAVENOUS | Status: AC
  Administered 2016-07-14: 20 mg via INTRAVENOUS
  Filled 2016-07-14: qty 50

## 2016-07-14 MED ORDER — SODIUM CHLORIDE 0.9% FLUSH
10.0000 mL | INTRAVENOUS | Status: DC | PRN
  Administered 2016-07-14: 10 mL
  Filled 2016-07-14: qty 10

## 2016-07-14 NOTE — Patient Instructions (Addendum)
[place] at [facility] Discharge Instructions  RECOMMENDATIONS MADE BY THE CONSULTANT AND ANY TEST RESULTS WILL BE SENT TO YOUR REFERRING PHYSICIAN.  You were seen today by Dr. Twana First We will schedule a CT scan after your next treatment We will see you in 3 weeks for your next treatment  Thank you for choosing [place] at [facility] to provide your oncology and hematology care.  To afford each patient quality time with our provider, please arrive at least 15 minutes before your scheduled appointment time.    If you have a lab appointment with the [place] please come in thru the  Main Entrance and check in at the main information desk  You need to re-schedule your appointment should you arrive 10 or more minutes late.  We strive to give you quality time with our providers, and arriving late affects you and other patients whose appointments are after yours.  Also, if you no show three or more times for appointments you may be dismissed from the clinic at the providers discretion.     Again, thank you for choosing [facility].  Our hope is that these requests will decrease the amount of time that you wait before being seen by our physicians.       _____________________________________________________________  Should you have questions after your visit to [facility], please contact our office at [phone] between the hours of 8:30 a.m. and 4:30 p.m.  Voicemails left after 4:30 p.m. will not be returned until the following business day.  For prescription refill requests, have your pharmacy contact our office.       Resources For Cancer Patients and their Caregivers ? American Cancer Society: Can assist with transportation, wigs, general needs, runs Look Good Feel Better.        5623660599 ? Cancer Care: Provides financial assistance, online support groups, medication/co-pay assistance.   1-800-813-HOPE 3201744008) ? [place] Assists [place] cancer patients and their families through emotional , educational and financial support.  810-444-3206 ? [place] DSS Where to apply for food stamps, Medicaid and utility assistance. 782-095-0218 ? RCATS: Transportation to medical appointments. 603-331-2472 ? Social Security Administration: May apply for disability if have a Stage IV cancer. 385-695-8324 (478) [phone] ? [employer], Disability and Transit Services: Assists with nutrition, care and transit needs. [place] Support Programs: @10RELATIVEDAYS @ > Cancer Support Group  2nd Tuesday of the month 1pm-2pm, Journey Room  > Creative Journey  3rd Tuesday of the month 1130am-1pm, Journey Room  > Look Good Feel Better  1st Wednesday of the month 10am-12 noon, Journey Room (Call [place] to register [phone])

## 2016-07-14 NOTE — Progress Notes (Signed)
Neulasta on-pro applied to pt's right arm with green indicator light flashing upon discharge

## 2016-07-14 NOTE — Progress Notes (Signed)
Micheal Davis tolerated chemo tx with Neulasta on-pro well without complaints or incident. Labs reviewed with Dr. Talbert Cage prior to administering chemotherapy.VSS upon discharge. Pt discharged self ambulatory in satisfactory condition accompanied by his wife

## 2016-07-14 NOTE — Patient Instructions (Signed)
[facility] Discharge Instructions for Patients Receiving Chemotherapy   Beginning January 23rd 2017 lab work for the Cancer Center will be done in the  Main lab at [place] on 1st floor. If you have a lab appointment with the Cancer Center please come in thru the  Main Entrance and check in at the main information desk   Today you received the following chemotherapy agents Jevtana as well as Neulasta on-pro. Follow-up as scheduled. Call clinic for any questions or concerns  To help prevent nausea and vomiting after your treatment, we encourage you to take your nausea medication   If you develop nausea and vomiting, or diarrhea that is not controlled by your medication, call the clinic.  The clinic phone number is [phone]. Office hours are Monday-Friday 8:30am-5:00pm.  BELOW ARE SYMPTOMS THAT SHOULD BE REPORTED IMMEDIATELY:  *FEVER GREATER THAN 101.0 F  *CHILLS WITH OR WITHOUT FEVER  NAUSEA AND VOMITING THAT IS NOT CONTROLLED WITH YOUR NAUSEA MEDICATION  *UNUSUAL SHORTNESS OF BREATH  *UNUSUAL BRUISING OR BLEEDING  TENDERNESS IN MOUTH AND THROAT WITH OR WITHOUT PRESENCE OF ULCERS  *URINARY PROBLEMS  *BOWEL PROBLEMS  UNUSUAL RASH Items with * indicate a potential emergency and should be followed up as soon as possible. If you have an emergency after office hours please contact your primary care physician or go to the nearest emergency department.  Please call the clinic during office hours if you have any questions or concerns.   You may also contact the Patient Navigator at [phone] should you have any questions or need assistance in obtaining follow up care.      Resources For Cancer Patients and their Caregivers ? American Cancer Society: Can assist with transportation, wigs, general needs, runs Look Good Feel Better.        1-888-227-6333 ? Cancer Care: Provides financial assistance, online support groups, medication/co-pay  assistance.  1-800-813-HOPE (4673) ? [facility] Assists [place] cancer patients and their families through emotional , educational and financial support.  336-427-4357 ? [place] DSS Where to apply for food stamps, Medicaid and utility assistance. 336-342-1394 ? RCATS: Transportation to medical appointments. 336-347-2287 ? Social Security Administration: May apply for disability if have a Stage IV cancer. 336-342-7796 1-800-772-1213 ? [place] Aging, Disability and Transit Services: Assists with nutrition, care and transit needs. 336-349-2343         
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
## 2016-07-15 ENCOUNTER — Other Ambulatory Visit (HOSPITAL_COMMUNITY): Payer: Self-pay | Admitting: Oncology

## 2016-07-15 DIAGNOSIS — C7951 Secondary malignant neoplasm of bone: Principal | ICD-10-CM

## 2016-07-15 DIAGNOSIS — C61 Malignant neoplasm of prostate: Secondary | ICD-10-CM

## 2016-07-26 DIAGNOSIS — J189 Pneumonia, unspecified organism: Secondary | ICD-10-CM | POA: Diagnosis not present

## 2016-07-26 DIAGNOSIS — R05 Cough: Secondary | ICD-10-CM | POA: Diagnosis not present

## 2016-07-26 DIAGNOSIS — J069 Acute upper respiratory infection, unspecified: Secondary | ICD-10-CM | POA: Diagnosis not present

## 2016-07-26 DIAGNOSIS — R509 Fever, unspecified: Secondary | ICD-10-CM | POA: Diagnosis not present

## 2016-07-29 ENCOUNTER — Other Ambulatory Visit (HOSPITAL_COMMUNITY): Payer: Self-pay | Admitting: Oncology

## 2016-07-29 ENCOUNTER — Telehealth (HOSPITAL_COMMUNITY): Payer: Self-pay | Admitting: *Deleted

## 2016-07-29 DIAGNOSIS — C7951 Secondary malignant neoplasm of bone: Principal | ICD-10-CM

## 2016-07-29 DIAGNOSIS — J209 Acute bronchitis, unspecified: Secondary | ICD-10-CM | POA: Diagnosis not present

## 2016-07-29 DIAGNOSIS — C61 Malignant neoplasm of prostate: Secondary | ICD-10-CM

## 2016-07-29 DIAGNOSIS — Z6833 Body mass index (BMI) 33.0-33.9, adult: Secondary | ICD-10-CM | POA: Diagnosis not present

## 2016-07-29 MED ORDER — MORPHINE SULFATE ER 30 MG PO TBCR: 30.0000 mg | EXTENDED_RELEASE_TABLET | Freq: Two times a day (BID) | ORAL | 0 refills | Status: DC

## 2016-07-29 NOTE — Telephone Encounter (Signed)
Rx printed.  TK 

## 2016-07-31 ENCOUNTER — Other Ambulatory Visit (HOSPITAL_COMMUNITY): Payer: Self-pay

## 2016-07-31 DIAGNOSIS — T451X5A Adverse effect of antineoplastic and immunosuppressive drugs, initial encounter: Secondary | ICD-10-CM

## 2016-07-31 DIAGNOSIS — K1379 Other lesions of oral mucosa: Secondary | ICD-10-CM

## 2016-07-31 MED ORDER — MAGIC MOUTHWASH W/LIDOCAINE: ORAL | 1 refills | Status: DC

## 2016-07-31 NOTE — Telephone Encounter (Signed)
Patient came by cancer center to pick up pain prescription and stated he needed a refill on magic mouthwash. Reviewed with provider, chart checked and refilled.

## 2016-08-01 DIAGNOSIS — Z6834 Body mass index (BMI) 34.0-34.9, adult: Secondary | ICD-10-CM | POA: Diagnosis not present

## 2016-08-01 DIAGNOSIS — E119 Type 2 diabetes mellitus without complications: Secondary | ICD-10-CM | POA: Diagnosis not present

## 2016-08-01 DIAGNOSIS — I10 Essential (primary) hypertension: Secondary | ICD-10-CM | POA: Diagnosis not present

## 2016-08-01 DIAGNOSIS — J209 Acute bronchitis, unspecified: Secondary | ICD-10-CM | POA: Diagnosis not present

## 2016-08-01 DIAGNOSIS — C61 Malignant neoplasm of prostate: Secondary | ICD-10-CM | POA: Diagnosis not present

## 2016-08-04 ENCOUNTER — Encounter (HOSPITAL_BASED_OUTPATIENT_CLINIC_OR_DEPARTMENT_OTHER): Payer: Medicare Other | Admitting: Oncology

## 2016-08-04 ENCOUNTER — Encounter (HOSPITAL_COMMUNITY): Payer: Self-pay

## 2016-08-04 ENCOUNTER — Encounter (HOSPITAL_COMMUNITY): Payer: Medicare Other | Attending: Hematology & Oncology

## 2016-08-04 VITALS — BP 144/77 | HR 18 | Temp 97.8°F | Resp 18 | Wt 246.8 lb

## 2016-08-04 DIAGNOSIS — Z5111 Encounter for antineoplastic chemotherapy: Secondary | ICD-10-CM

## 2016-08-04 DIAGNOSIS — C7951 Secondary malignant neoplasm of bone: Secondary | ICD-10-CM | POA: Diagnosis not present

## 2016-08-04 DIAGNOSIS — C61 Malignant neoplasm of prostate: Secondary | ICD-10-CM | POA: Diagnosis not present

## 2016-08-04 DIAGNOSIS — M25511 Pain in right shoulder: Secondary | ICD-10-CM | POA: Diagnosis not present

## 2016-08-04 LAB — COMPREHENSIVE METABOLIC PANEL
ALT: 19 U/L (ref 17–63)
AST: 22 U/L (ref 15–41)
Albumin: 3.8 g/dL (ref 3.5–5.0)
Alkaline Phosphatase: 72 U/L (ref 38–126)
Anion gap: 10 (ref 5–15)
BUN: 17 mg/dL (ref 6–20)
CO2: 25 mmol/L (ref 22–32)
CREATININE: 0.83 mg/dL (ref 0.61–1.24)
Calcium: 9.4 mg/dL (ref 8.9–10.3)
Chloride: 105 mmol/L (ref 101–111)
Glucose, Bld: 117 mg/dL — ABNORMAL HIGH (ref 65–99)
Potassium: 3.8 mmol/L (ref 3.5–5.1)
Sodium: 140 mmol/L (ref 135–145)
TOTAL PROTEIN: 6.1 g/dL — AB (ref 6.5–8.1)
Total Bilirubin: 0.7 mg/dL (ref 0.3–1.2)

## 2016-08-04 LAB — CBC WITH DIFFERENTIAL/PLATELET
Basophils Absolute: 0 10*3/uL (ref 0.0–0.1)
Basophils Relative: 0 %
EOS PCT: 0 %
Eosinophils Absolute: 0 10*3/uL (ref 0.0–0.7)
HEMATOCRIT: 35.7 % — AB (ref 39.0–52.0)
Hemoglobin: 11.9 g/dL — ABNORMAL LOW (ref 13.0–17.0)
LYMPHS ABS: 1 10*3/uL (ref 0.7–4.0)
LYMPHS PCT: 10 %
MCH: 31.3 pg (ref 26.0–34.0)
MCHC: 33.3 g/dL (ref 30.0–36.0)
MCV: 93.9 fL (ref 78.0–100.0)
MONO ABS: 0.9 10*3/uL (ref 0.1–1.0)
Monocytes Relative: 8 %
Neutro Abs: 8.2 10*3/uL — ABNORMAL HIGH (ref 1.7–7.7)
Neutrophils Relative %: 82 %
PLATELETS: 253 10*3/uL (ref 150–400)
RBC: 3.8 MIL/uL — ABNORMAL LOW (ref 4.22–5.81)
RDW: 14.7 % (ref 11.5–15.5)
WBC: 10.1 10*3/uL (ref 4.0–10.5)

## 2016-08-04 MED ORDER — DEXAMETHASONE SODIUM PHOSPHATE 10 MG/ML IJ SOLN
10.0000 mg | Freq: Once | INTRAMUSCULAR | Status: AC
Start: 2016-08-04 — End: 2016-08-04
  Administered 2016-08-04: 10 mg via INTRAVENOUS
  Filled 2016-08-04: qty 1

## 2016-08-04 MED ORDER — SODIUM CHLORIDE 0.9 % IV SOLN
Freq: Once | INTRAVENOUS | Status: AC
  Administered 2016-08-04: 12:00:00 via INTRAVENOUS

## 2016-08-04 MED ORDER — CABAZITAXEL CHEMO INJECTION 60 MG/6ML W/DILUENT
20.0000 mg/m2 | Freq: Once | INTRAVENOUS | Status: AC
  Administered 2016-08-04: 48 mg via INTRAVENOUS
  Filled 2016-08-04: qty 4.8

## 2016-08-04 MED ORDER — DIPHENHYDRAMINE HCL 50 MG/ML IJ SOLN
25.0000 mg | Freq: Once | INTRAMUSCULAR | Status: AC
  Administered 2016-08-04: 25 mg via INTRAVENOUS
  Filled 2016-08-04: qty 1

## 2016-08-04 MED ORDER — PEGFILGRASTIM 6 MG/0.6ML ~~LOC~~ PSKT
6.0000 mg | PREFILLED_SYRINGE | Freq: Once | SUBCUTANEOUS | Status: AC
  Administered 2016-08-04: 6 mg via SUBCUTANEOUS
  Filled 2016-08-04: qty 0.6

## 2016-08-04 MED ORDER — FAMOTIDINE IN NACL 20-0.9 MG/50ML-% IV SOLN
20.0000 mg | Freq: Once | INTRAVENOUS | Status: AC
  Administered 2016-08-04: 20 mg via INTRAVENOUS
  Filled 2016-08-04: qty 50

## 2016-08-04 MED ORDER — HEPARIN SOD (PORK) LOCK FLUSH 100 UNIT/ML IV SOLN
500.0000 [IU] | Freq: Once | INTRAVENOUS | Status: AC | PRN
  Administered 2016-08-04: 500 [IU]
  Filled 2016-08-04: qty 5

## 2016-08-04 MED ORDER — OXYCODONE-ACETAMINOPHEN 10-325 MG PO TABS: 1.0000 | ORAL_TABLET | Freq: Four times a day (QID) | ORAL | 0 refills | Status: DC | PRN

## 2016-08-04 MED ORDER — SODIUM CHLORIDE 0.9% FLUSH
10.0000 mL | INTRAVENOUS | Status: DC | PRN
  Administered 2016-08-04: 10 mL
  Filled 2016-08-04: qty 10

## 2016-08-04 NOTE — Patient Instructions (Signed)
[facility] Discharge Instructions for Patients Receiving Chemotherapy   Beginning January 23rd 2017 lab work for the Cancer Center will be done in the  Main lab at [place] on 1st floor. If you have a lab appointment with the Cancer Center please come in thru the  Main Entrance and check in at the main information desk   Today you received the following chemotherapy agents Jevtana as well as Neulasta on-pro. Follow-up as scheduled. Call clinic for any questions or concerns  To help prevent nausea and vomiting after your treatment, we encourage you to take your nausea medication   If you develop nausea and vomiting, or diarrhea that is not controlled by your medication, call the clinic.  The clinic phone number is [phone]. Office hours are Monday-Friday 8:30am-5:00pm.  BELOW ARE SYMPTOMS THAT SHOULD BE REPORTED IMMEDIATELY:  *FEVER GREATER THAN 101.0 F  *CHILLS WITH OR WITHOUT FEVER  NAUSEA AND VOMITING THAT IS NOT CONTROLLED WITH YOUR NAUSEA MEDICATION  *UNUSUAL SHORTNESS OF BREATH  *UNUSUAL BRUISING OR BLEEDING  TENDERNESS IN MOUTH AND THROAT WITH OR WITHOUT PRESENCE OF ULCERS  *URINARY PROBLEMS  *BOWEL PROBLEMS  UNUSUAL RASH Items with * indicate a potential emergency and should be followed up as soon as possible. If you have an emergency after office hours please contact your primary care physician or go to the nearest emergency department.  Please call the clinic during office hours if you have any questions or concerns.   You may also contact the Patient Navigator at [phone] should you have any questions or need assistance in obtaining follow up care.      Resources For Cancer Patients and their Caregivers ? American Cancer Society: Can assist with transportation, wigs, general needs, runs Look Good Feel Better.        1-888-227-6333 ? Cancer Care: Provides financial assistance, online support groups, medication/co-pay  assistance.  1-800-813-HOPE (4673) ? [facility] Assists [place] cancer patients and their families through emotional , educational and financial support.  336-427-4357 ? [place] DSS Where to apply for food stamps, Medicaid and utility assistance. 336-342-1394 ? RCATS: Transportation to medical appointments. 336-347-2287 ? Social Security Administration: May apply for disability if have a Stage IV cancer. 336-342-7796 1-800-772-1213 ? [place] Aging, Disability and Transit Services: Assists with nutrition, care and transit needs. 336-349-2343         

## 2016-08-04 NOTE — Patient Instructions (Signed)
[place] at [facility] Discharge Instructions  RECOMMENDATIONS MADE BY THE CONSULTANT AND ANY TEST RESULTS WILL BE SENT TO YOUR REFERRING PHYSICIAN.  You were seen today by Dr. Talbert Cage. Bone scan and CT scan prior to next visit. Return in 3 weeks for chemo and follow up.   Thank you for choosing [place] at [facility] to provide your oncology and hematology care.  To afford each patient quality time with our provider, please arrive at least 15 minutes before your scheduled appointment time.    If you have a lab appointment with the [place] please come in thru the  Main Entrance and check in at the main information desk  You need to re-schedule your appointment should you arrive 10 or more minutes late.  We strive to give you quality time with our providers, and arriving late affects you and other patients whose appointments are after yours.  Also, if you no show three or more times for appointments you may be dismissed from the clinic at the providers discretion.     Again, thank you for choosing [facility].  Our hope is that these requests will decrease the amount of time that you wait before being seen by our physicians.       _____________________________________________________________  Should you have questions after your visit to [facility], please contact our office at [phone] between the hours of 8:30 a.m. and 4:30 p.m.  Voicemails left after 4:30 p.m. will not be returned until the following business day.  For prescription refill requests, have your pharmacy contact our office.       Resources For Cancer Patients and their Caregivers ? American Cancer Society: Can assist with transportation, wigs, general needs, runs Look Good Feel Better.        909-442-8112 ? Cancer Care: Provides financial assistance, online support groups, medication/co-pay assistance.  1-800-813-HOPE  843-677-6314) ? [facility] Assists [place] cancer patients and their families through emotional , educational and financial support.  743 233 1488 ? [place] DSS Where to apply for food stamps, Medicaid and utility assistance. (347)753-3827 ? RCATS: Transportation to medical appointments. 505 131 2139 ? Social Security Administration: May apply for disability if have a Stage IV cancer. 906-324-5390 904-649-8365 ? [employer], Disability and Transit Services: Assists with nutrition, care and transit needs. [place] Support Programs: @10RELATIVEDAYS @ > Cancer Support Group  2nd Tuesday of the month 1pm-2pm, Journey Room  > Creative Journey  3rd Tuesday of the month 1130am-1pm, Journey Room  > Look Good Feel Better  1st Wednesday of the month 10am-12 noon, Journey Room (Call [place] to register [phone])
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
## 2016-08-04 NOTE — Progress Notes (Signed)
Micheal Davis tolerated chemo tx well without complaints or incident. Labs reviewed with Dr. Talbert Cage prior to administering chemotherapy. Neulasta on-pro applied to pt's left arm with green indicator light flashing. VSS upon discharge. Pt discharged self ambulatory in satisfactory condition accompanied by his wife

## 2016-08-07 ENCOUNTER — Encounter (HOSPITAL_BASED_OUTPATIENT_CLINIC_OR_DEPARTMENT_OTHER): Payer: Medicare Other

## 2016-08-07 ENCOUNTER — Encounter (HOSPITAL_COMMUNITY): Payer: Self-pay

## 2016-08-07 ENCOUNTER — Other Ambulatory Visit (HOSPITAL_COMMUNITY): Payer: Medicare Other

## 2016-08-07 VITALS — BP 130/68 | HR 71 | Temp 98.6°F | Resp 20 | Wt 244.4 lb

## 2016-08-07 DIAGNOSIS — C7951 Secondary malignant neoplasm of bone: Secondary | ICD-10-CM | POA: Diagnosis not present

## 2016-08-07 DIAGNOSIS — C61 Malignant neoplasm of prostate: Secondary | ICD-10-CM

## 2016-08-07 DIAGNOSIS — Z5111 Encounter for antineoplastic chemotherapy: Secondary | ICD-10-CM

## 2016-08-07 MED ORDER — LEUPROLIDE ACETATE 7.5 MG IM KIT
7.5000 mg | PACK | INTRAMUSCULAR | Status: DC
  Administered 2016-08-07: 7.5 mg via INTRAMUSCULAR
  Filled 2016-08-07: qty 7.5

## 2016-08-07 MED ORDER — DENOSUMAB 120 MG/1.7ML ~~LOC~~ SOLN
120.0000 mg | Freq: Once | SUBCUTANEOUS | Status: AC
  Administered 2016-08-07: 120 mg via SUBCUTANEOUS
  Filled 2016-08-07: qty 1.7

## 2016-08-07 NOTE — Progress Notes (Signed)
[name] presents today for injection per the provider's orders.  Lupron and Xgeva administrations without incident; see MAR for injection details.  Patient tolerated procedure well and without incident.  No questions or complaints noted at this time. Discharged ambulatory.

## 2016-08-07 NOTE — Patient Instructions (Signed)
[place] at [facility] Discharge Instructions  RECOMMENDATIONS MADE BY THE CONSULTANT AND ANY TEST RESULTS WILL BE SENT TO YOUR REFERRING PHYSICIAN.  Xgeva and Lupron injections today. Return as scheduled.   Thank you for choosing [place] at [facility] to provide your oncology and hematology care.  To afford each patient quality time with our provider, please arrive at least 15 minutes before your scheduled appointment time.    If you have a lab appointment with the [facility] please come in thru the  Main Entrance and check in at the main information desk  You need to re-schedule your appointment should you arrive 10 or more minutes late.  We strive to give you quality time with our providers, and arriving late affects you and other patients whose appointments are after yours.  Also, if you no show three or more times for appointments you may be dismissed from the clinic at the providers discretion.     Again, thank you for choosing [facility].  Our hope is that these requests will decrease the amount of time that you wait before being seen by our physicians.       _____________________________________________________________  Should you have questions after your visit to [facility], please contact our office at [phone] between the hours of 8:30 a.m. and 4:30 p.m.  Voicemails left after 4:30 p.m. will not be returned until the following business day.  For prescription refill requests, have your pharmacy contact our office.       Resources For Cancer Patients and their Caregivers ? American Cancer Society: Can assist with transportation, wigs, general needs, runs Look Good Feel Better.        249-822-2062 ? Cancer Care: Provides financial assistance, online support groups, medication/co-pay assistance.  1-800-813-HOPE 765-042-6401) ? [employer] Assists [place] cancer  patients and their families through emotional , educational and financial support.  (661)752-7847 ? [place] DSS Where to apply for food stamps, Medicaid and utility assistance. 9411717850 ? [facility]: Transportation to medical appointments. 539 625 7899 ? Social Security Administration: May apply for disability if have a Stage IV cancer. (225) 230-2641 249-297-1234 ? [employer], Disability and Transit Services: Assists with nutrition, care and transit needs. [place] Support Programs: @10RELATIVEDAYS @ > Cancer Support Group  2nd Tuesday of the month 1pm-2pm, Journey Room  > Creative Journey  3rd Tuesday of the month 1130am-1pm, Journey Room  > Look Good Feel Better  1st Wednesday of the month 10am-12 noon, Journey Room (Call [place] to register [phone])

## 2016-08-11 DIAGNOSIS — C61 Malignant neoplasm of prostate: Secondary | ICD-10-CM | POA: Diagnosis not present

## 2016-08-11 DIAGNOSIS — E119 Type 2 diabetes mellitus without complications: Secondary | ICD-10-CM | POA: Diagnosis not present

## 2016-08-11 DIAGNOSIS — Z6833 Body mass index (BMI) 33.0-33.9, adult: Secondary | ICD-10-CM | POA: Diagnosis not present

## 2016-08-11 DIAGNOSIS — I10 Essential (primary) hypertension: Secondary | ICD-10-CM | POA: Diagnosis not present

## 2016-08-11 DIAGNOSIS — J209 Acute bronchitis, unspecified: Secondary | ICD-10-CM | POA: Diagnosis not present

## 2016-08-18 ENCOUNTER — Encounter (HOSPITAL_COMMUNITY): Payer: Self-pay

## 2016-08-18 ENCOUNTER — Encounter (HOSPITAL_COMMUNITY)
Admission: RE | Admit: 2016-08-18 | Discharge: 2016-08-18 | Disposition: A | Discharge status: Home / Self Care | Payer: Medicare Other | Source: Ambulatory Visit | Attending: Oncology | Admitting: Oncology

## 2016-08-18 DIAGNOSIS — C61 Malignant neoplasm of prostate: Secondary | ICD-10-CM | POA: Diagnosis not present

## 2016-08-18 DIAGNOSIS — C7951 Secondary malignant neoplasm of bone: Secondary | ICD-10-CM | POA: Diagnosis not present

## 2016-08-18 MED ORDER — TECHNETIUM TC 99M MEDRONATE IV KIT
20.0000 | PACK | Freq: Once | INTRAVENOUS | Status: AC | PRN
  Administered 2016-08-18: 20 via INTRAVENOUS

## 2016-08-21 ENCOUNTER — Encounter (HOSPITAL_COMMUNITY): Payer: Self-pay

## 2016-08-21 ENCOUNTER — Ambulatory Visit (HOSPITAL_COMMUNITY)
Admission: RE | Admit: 2016-08-21 | Discharge: 2016-08-21 | Disposition: A | Discharge status: Home / Self Care | Payer: Medicare Other | Source: Ambulatory Visit | Attending: Oncology | Admitting: Oncology

## 2016-08-21 DIAGNOSIS — C61 Malignant neoplasm of prostate: Secondary | ICD-10-CM

## 2016-08-21 DIAGNOSIS — I700 Atherosclerosis of aorta: Secondary | ICD-10-CM | POA: Diagnosis not present

## 2016-08-21 DIAGNOSIS — C7951 Secondary malignant neoplasm of bone: Secondary | ICD-10-CM | POA: Diagnosis not present

## 2016-08-21 DIAGNOSIS — K8020 Calculus of gallbladder without cholecystitis without obstruction: Secondary | ICD-10-CM | POA: Diagnosis not present

## 2016-08-21 MED ORDER — IOPAMIDOL (ISOVUE-300) INJECTION 61%
100.0000 mL | Freq: Once | INTRAVENOUS | Status: AC | PRN
  Administered 2016-08-21: 100 mL via INTRAVENOUS

## 2016-08-25 ENCOUNTER — Encounter (HOSPITAL_BASED_OUTPATIENT_CLINIC_OR_DEPARTMENT_OTHER): Payer: Medicare Other | Admitting: Oncology

## 2016-08-25 ENCOUNTER — Encounter (HOSPITAL_COMMUNITY): Payer: Self-pay

## 2016-08-25 ENCOUNTER — Encounter (HOSPITAL_BASED_OUTPATIENT_CLINIC_OR_DEPARTMENT_OTHER): Payer: Medicare Other

## 2016-08-25 VITALS — BP 148/72 | HR 67 | Temp 98.7°F | Resp 18 | Wt 242.6 lb

## 2016-08-25 DIAGNOSIS — C7951 Secondary malignant neoplasm of bone: Secondary | ICD-10-CM

## 2016-08-25 DIAGNOSIS — E119 Type 2 diabetes mellitus without complications: Secondary | ICD-10-CM | POA: Diagnosis not present

## 2016-08-25 DIAGNOSIS — Z5111 Encounter for antineoplastic chemotherapy: Secondary | ICD-10-CM | POA: Diagnosis present

## 2016-08-25 DIAGNOSIS — C61 Malignant neoplasm of prostate: Secondary | ICD-10-CM

## 2016-08-25 DIAGNOSIS — I10 Essential (primary) hypertension: Secondary | ICD-10-CM | POA: Diagnosis not present

## 2016-08-25 LAB — COMPREHENSIVE METABOLIC PANEL
ALBUMIN: 3.8 g/dL (ref 3.5–5.0)
ALT: 27 U/L (ref 17–63)
AST: 25 U/L (ref 15–41)
Alkaline Phosphatase: 90 U/L (ref 38–126)
Anion gap: 8 (ref 5–15)
BUN: 14 mg/dL (ref 6–20)
CO2: 28 mmol/L (ref 22–32)
CREATININE: 0.7 mg/dL (ref 0.61–1.24)
Calcium: 9.4 mg/dL (ref 8.9–10.3)
Chloride: 100 mmol/L — ABNORMAL LOW (ref 101–111)
GFR calc Af Amer: >60 mL/min (ref >60)
GFR calc non Af Amer: >60 mL/min (ref >60)
Glucose, Bld: 147 mg/dL — ABNORMAL HIGH (ref 65–99)
POTASSIUM: 3.9 mmol/L (ref 3.5–5.1)
SODIUM: 136 mmol/L (ref 135–145)
Total Bilirubin: 0.7 mg/dL (ref 0.3–1.2)
Total Protein: 5.9 g/dL — ABNORMAL LOW (ref 6.5–8.1)

## 2016-08-25 LAB — CBC WITH DIFFERENTIAL/PLATELET
BASOS ABS: 0 10*3/uL (ref 0.0–0.1)
BASOS PCT: 0 %
EOS ABS: 0.1 10*3/uL (ref 0.0–0.7)
EOS PCT: 1 %
HCT: 37.6 % — ABNORMAL LOW (ref 39.0–52.0)
Hemoglobin: 12.6 g/dL — ABNORMAL LOW (ref 13.0–17.0)
Lymphocytes Relative: 11 %
Lymphs Abs: 0.9 10*3/uL (ref 0.7–4.0)
MCH: 31.9 pg (ref 26.0–34.0)
MCHC: 33.5 g/dL (ref 30.0–36.0)
MCV: 95.2 fL (ref 78.0–100.0)
MONO ABS: 0.7 10*3/uL (ref 0.1–1.0)
Monocytes Relative: 8 %
Neutro Abs: 6.8 10*3/uL (ref 1.7–7.7)
Neutrophils Relative %: 80 %
PLATELETS: 161 10*3/uL (ref 150–400)
RBC: 3.95 MIL/uL — AB (ref 4.22–5.81)
RDW: 15.1 % (ref 11.5–15.5)
WBC: 8.4 10*3/uL (ref 4.0–10.5)

## 2016-08-25 MED ORDER — DIPHENHYDRAMINE HCL 50 MG/ML IJ SOLN
25.0000 mg | Freq: Once | INTRAMUSCULAR | Status: AC
  Administered 2016-08-25: 25 mg via INTRAVENOUS
  Filled 2016-08-25: qty 1

## 2016-08-25 MED ORDER — DEXTROSE 5 % IV SOLN
20.0000 mg/m2 | Freq: Once | INTRAVENOUS | Status: AC
  Administered 2016-08-25: 48 mg via INTRAVENOUS
  Filled 2016-08-25: qty 4.8

## 2016-08-25 MED ORDER — PEGFILGRASTIM 6 MG/0.6ML ~~LOC~~ PSKT
6.0000 mg | PREFILLED_SYRINGE | Freq: Once | SUBCUTANEOUS | Status: AC
  Administered 2016-08-25: 6 mg via SUBCUTANEOUS
  Filled 2016-08-25: qty 0.6

## 2016-08-25 MED ORDER — SODIUM CHLORIDE 0.9% FLUSH: 10.0000 mL | INTRAVENOUS | Status: DC | PRN

## 2016-08-25 MED ORDER — HEPARIN SOD (PORK) LOCK FLUSH 100 UNIT/ML IV SOLN
500.0000 [IU] | Freq: Once | INTRAVENOUS | Status: AC | PRN
  Administered 2016-08-25: 500 [IU]
  Filled 2016-08-25: qty 5

## 2016-08-25 MED ORDER — SODIUM CHLORIDE 0.9 % IV SOLN
Freq: Once | INTRAVENOUS | Status: AC
  Administered 2016-08-25: 11:00:00 via INTRAVENOUS

## 2016-08-25 MED ORDER — FAMOTIDINE IN NACL 20-0.9 MG/50ML-% IV SOLN
20.0000 mg | Freq: Once | INTRAVENOUS | Status: AC
  Administered 2016-08-25: 20 mg via INTRAVENOUS
  Filled 2016-08-25: qty 50

## 2016-08-25 MED ORDER — DEXAMETHASONE SODIUM PHOSPHATE 10 MG/ML IJ SOLN
10.0000 mg | Freq: Once | INTRAMUSCULAR | Status: AC
  Administered 2016-08-25: 10 mg via INTRAVENOUS
  Filled 2016-08-25: qty 1

## 2016-08-25 NOTE — Patient Instructions (Signed)
[facility] Discharge Instructions for Patients Receiving Chemotherapy   Beginning January 23rd 2017 lab work for the [facility] will be done in the  Main lab at [facility] on 1st floor. If you have a lab appointment with the [facility] please come in thru the  Main Entrance and check in at the main information desk   Today you received the following chemotherapy agent: Jevtana, Neulasta injection  To help prevent nausea and vomiting after your treatment, we encourage you to take your nausea medication as prescribed. Your neulasta on-pro was placed at 1 pm and will begin to administer medication around 4 pm on 6/26.  You can safely remove the device when the green light is off and chamber is empty.   If you develop nausea and vomiting, or diarrhea that is not controlled by your medication, call the clinic.  The clinic phone number is [phone]. Office hours are Monday-Friday 8:30am-5:00pm.  BELOW ARE SYMPTOMS THAT SHOULD BE REPORTED IMMEDIATELY:  *FEVER GREATER THAN 101.0 F  *CHILLS WITH OR WITHOUT FEVER  NAUSEA AND VOMITING THAT IS NOT CONTROLLED WITH YOUR NAUSEA MEDICATION  *UNUSUAL SHORTNESS OF BREATH  *UNUSUAL BRUISING OR BLEEDING  TENDERNESS IN MOUTH AND THROAT WITH OR WITHOUT PRESENCE OF ULCERS  *URINARY PROBLEMS  *BOWEL PROBLEMS  UNUSUAL RASH Items with * indicate a potential emergency and should be followed up as soon as possible. If you have an emergency after office hours please contact your primary care physician or go to the nearest emergency department.  Please call the clinic during office hours if you have any questions or concerns.   You may also contact the Patient Navigator at [phone] should you have any questions or need assistance in obtaining follow up care.      Resources For Cancer Patients and their Caregivers ? American Cancer Society: Can assist with transportation, wigs, general needs, runs Look Good Feel  Better.        754-122-7642 ? Cancer Care: Provides financial assistance, online support groups, medication/co-pay assistance.  1-800-813-HOPE 508-101-6274) ? [employer] Assists [place] cancer patients and their families through emotional , educational and financial support.  765 278 4590 ? [place] DSS Where to apply for food stamps, Medicaid and utility assistance. 813 113 2615 ? [facility]: Transportation to medical appointments. 334-676-1743 ? Social Security Administration: May apply for disability if have a Stage IV cancer. (302) 258-6769 815-245-1095 ? [employer], Disability and Transit Services: Assists with nutrition, care and transit needs. 919-178-4132
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
## 2016-08-25 NOTE — Progress Notes (Signed)
Patient tolerated treatment today without incidence. Neulasta On-Pro applied to left upper posterior arm. Light blinking green and medication chamber is full.  Patient aware of when to remove On-Pro and verbalizes understanding.  Patient discharged ambulatory and in stable condition from clinic with spouse. Follow up as scheduled.

## 2016-09-04 ENCOUNTER — Encounter (HOSPITAL_COMMUNITY): Payer: Medicare Other | Attending: Hematology & Oncology

## 2016-09-04 ENCOUNTER — Encounter (HOSPITAL_COMMUNITY): Payer: Self-pay

## 2016-09-04 ENCOUNTER — Encounter (HOSPITAL_BASED_OUTPATIENT_CLINIC_OR_DEPARTMENT_OTHER): Payer: Medicare Other

## 2016-09-04 VITALS — BP 153/77 | HR 63 | Temp 98.1°F | Resp 18

## 2016-09-04 DIAGNOSIS — Z5111 Encounter for antineoplastic chemotherapy: Secondary | ICD-10-CM | POA: Diagnosis present

## 2016-09-04 DIAGNOSIS — C61 Malignant neoplasm of prostate: Secondary | ICD-10-CM | POA: Insufficient documentation

## 2016-09-04 DIAGNOSIS — C7951 Secondary malignant neoplasm of bone: Secondary | ICD-10-CM | POA: Insufficient documentation

## 2016-09-04 LAB — COMPREHENSIVE METABOLIC PANEL
ALK PHOS: 108 U/L (ref 38–126)
ALT: 29 U/L (ref 17–63)
AST: 29 U/L (ref 15–41)
Albumin: 4 g/dL (ref 3.5–5.0)
Anion gap: 9 (ref 5–15)
BUN: 16 mg/dL (ref 6–20)
CALCIUM: 9.4 mg/dL (ref 8.9–10.3)
CO2: 27 mmol/L (ref 22–32)
CREATININE: 0.93 mg/dL (ref 0.61–1.24)
Chloride: 101 mmol/L (ref 101–111)
GFR calc Af Amer: >60 mL/min (ref >60)
Glucose, Bld: 155 mg/dL — ABNORMAL HIGH (ref 65–99)
Potassium: 4.2 mmol/L (ref 3.5–5.1)
SODIUM: 137 mmol/L (ref 135–145)
Total Bilirubin: 0.7 mg/dL (ref 0.3–1.2)
Total Protein: 6.3 g/dL — ABNORMAL LOW (ref 6.5–8.1)

## 2016-09-04 LAB — CBC WITH DIFFERENTIAL/PLATELET
BASOS PCT: 1 %
Basophils Absolute: 0.1 10*3/uL (ref 0.0–0.1)
EOS PCT: 1 %
Eosinophils Absolute: 0.1 10*3/uL (ref 0.0–0.7)
HCT: 37.5 % — ABNORMAL LOW (ref 39.0–52.0)
HEMOGLOBIN: 12.6 g/dL — AB (ref 13.0–17.0)
LYMPHS PCT: 9 %
Lymphs Abs: 1.2 10*3/uL (ref 0.7–4.0)
MCH: 32.1 pg (ref 26.0–34.0)
MCHC: 33.6 g/dL (ref 30.0–36.0)
MCV: 95.7 fL (ref 78.0–100.0)
MONO ABS: 0.7 10*3/uL (ref 0.1–1.0)
MONOS PCT: 5 %
Neutro Abs: 11.6 10*3/uL — ABNORMAL HIGH (ref 1.7–7.7)
Neutrophils Relative %: 84 %
Platelets: 218 10*3/uL (ref 150–400)
RBC: 3.92 MIL/uL — AB (ref 4.22–5.81)
RDW: 15.1 % (ref 11.5–15.5)
WBC: 13.7 10*3/uL — AB (ref 4.0–10.5)

## 2016-09-04 MED ORDER — DENOSUMAB 120 MG/1.7ML ~~LOC~~ SOLN
120.0000 mg | Freq: Once | SUBCUTANEOUS | Status: AC
  Administered 2016-09-04: 120 mg via SUBCUTANEOUS
  Filled 2016-09-04: qty 1.7

## 2016-09-04 MED ORDER — MORPHINE SULFATE ER 30 MG PO TBCR: 30.0000 mg | EXTENDED_RELEASE_TABLET | Freq: Two times a day (BID) | ORAL | 0 refills | Status: DC

## 2016-09-04 MED ORDER — LEUPROLIDE ACETATE 7.5 MG IM KIT
7.5000 mg | PACK | INTRAMUSCULAR | Status: DC
  Administered 2016-09-04: 7.5 mg via INTRAMUSCULAR
  Filled 2016-09-04: qty 7.5

## 2016-09-04 NOTE — Progress Notes (Signed)
[name] presents today for injection per the provider's orders.  Xgeva and Lupron administrations without incident; see MAR for injection details.  Patient tolerated procedure well and without incident.  No questions or complaints noted at this time.  Discharged ambulatory.

## 2016-09-15 ENCOUNTER — Encounter (HOSPITAL_BASED_OUTPATIENT_CLINIC_OR_DEPARTMENT_OTHER): Payer: Medicare Other | Admitting: Oncology

## 2016-09-15 ENCOUNTER — Encounter (HOSPITAL_BASED_OUTPATIENT_CLINIC_OR_DEPARTMENT_OTHER): Payer: Medicare Other

## 2016-09-15 ENCOUNTER — Encounter (HOSPITAL_COMMUNITY): Payer: Self-pay | Admitting: Oncology

## 2016-09-15 VITALS — BP 132/76 | HR 55 | Temp 98.0°F | Resp 20 | Wt 241.8 lb

## 2016-09-15 DIAGNOSIS — C61 Malignant neoplasm of prostate: Secondary | ICD-10-CM | POA: Diagnosis not present

## 2016-09-15 DIAGNOSIS — C7951 Secondary malignant neoplasm of bone: Secondary | ICD-10-CM

## 2016-09-15 DIAGNOSIS — Z5111 Encounter for antineoplastic chemotherapy: Secondary | ICD-10-CM | POA: Diagnosis not present

## 2016-09-15 LAB — COMPREHENSIVE METABOLIC PANEL
ALK PHOS: 76 U/L (ref 38–126)
ALT: 24 U/L (ref 17–63)
AST: 25 U/L (ref 15–41)
Albumin: 3.7 g/dL (ref 3.5–5.0)
Anion gap: 9 (ref 5–15)
BILIRUBIN TOTAL: 0.7 mg/dL (ref 0.3–1.2)
BUN: 13 mg/dL (ref 6–20)
CALCIUM: 8.9 mg/dL (ref 8.9–10.3)
CHLORIDE: 106 mmol/L (ref 101–111)
CO2: 25 mmol/L (ref 22–32)
CREATININE: 0.78 mg/dL (ref 0.61–1.24)
GFR calc Af Amer: >60 mL/min (ref >60)
GFR calc non Af Amer: >60 mL/min (ref >60)
Glucose, Bld: 180 mg/dL — ABNORMAL HIGH (ref 65–99)
Potassium: 3.5 mmol/L (ref 3.5–5.1)
Sodium: 140 mmol/L (ref 135–145)
Total Protein: 5.8 g/dL — ABNORMAL LOW (ref 6.5–8.1)

## 2016-09-15 LAB — CBC WITH DIFFERENTIAL/PLATELET
BASOS ABS: 0 10*3/uL (ref 0.0–0.1)
Basophils Relative: 0 %
Eosinophils Absolute: 0 10*3/uL (ref 0.0–0.7)
Eosinophils Relative: 0 %
HEMATOCRIT: 35.6 % — AB (ref 39.0–52.0)
Hemoglobin: 12.1 g/dL — ABNORMAL LOW (ref 13.0–17.0)
LYMPHS PCT: 8 %
Lymphs Abs: 0.6 10*3/uL — ABNORMAL LOW (ref 0.7–4.0)
MCH: 32.3 pg (ref 26.0–34.0)
MCHC: 34 g/dL (ref 30.0–36.0)
MCV: 94.9 fL (ref 78.0–100.0)
Monocytes Absolute: 0.4 10*3/uL (ref 0.1–1.0)
Monocytes Relative: 5 %
NEUTROS ABS: 7.2 10*3/uL (ref 1.7–7.7)
NEUTROS PCT: 87 %
Platelets: 170 10*3/uL (ref 150–400)
RBC: 3.75 MIL/uL — ABNORMAL LOW (ref 4.22–5.81)
RDW: 14.8 % (ref 11.5–15.5)
WBC: 8.3 10*3/uL (ref 4.0–10.5)

## 2016-09-15 LAB — PSA: Prostatic Specific Antigen: 68 ng/mL — ABNORMAL HIGH (ref 0.00–4.00)

## 2016-09-15 MED ORDER — DEXTROSE 5 % IV SOLN
20.0000 mg/m2 | Freq: Once | INTRAVENOUS | Status: AC
  Administered 2016-09-15: 48 mg via INTRAVENOUS
  Filled 2016-09-15: qty 4.8

## 2016-09-15 MED ORDER — HEPARIN SOD (PORK) LOCK FLUSH 100 UNIT/ML IV SOLN
500.0000 [IU] | Freq: Once | INTRAVENOUS | Status: AC | PRN
  Administered 2016-09-15: 500 [IU]
  Filled 2016-09-15: qty 5

## 2016-09-15 MED ORDER — DIPHENHYDRAMINE HCL 50 MG/ML IJ SOLN
INTRAMUSCULAR | Status: AC
  Filled 2016-09-15: qty 1

## 2016-09-15 MED ORDER — DEXAMETHASONE SODIUM PHOSPHATE 10 MG/ML IJ SOLN
10.0000 mg | Freq: Once | INTRAMUSCULAR | Status: AC
  Administered 2016-09-15: 10 mg via INTRAVENOUS

## 2016-09-15 MED ORDER — SODIUM CHLORIDE 0.9 % IV SOLN
Freq: Once | INTRAVENOUS | Status: AC
  Administered 2016-09-15: 11:00:00 via INTRAVENOUS

## 2016-09-15 MED ORDER — SODIUM CHLORIDE 0.9% FLUSH
10.0000 mL | INTRAVENOUS | Status: DC | PRN
  Administered 2016-09-15: 10 mL
  Filled 2016-09-15: qty 10

## 2016-09-15 MED ORDER — FAMOTIDINE IN NACL 20-0.9 MG/50ML-% IV SOLN
20.0000 mg | Freq: Once | INTRAVENOUS | Status: AC
  Administered 2016-09-15: 20 mg via INTRAVENOUS

## 2016-09-15 MED ORDER — PEGFILGRASTIM 6 MG/0.6ML ~~LOC~~ PSKT
6.0000 mg | PREFILLED_SYRINGE | Freq: Once | SUBCUTANEOUS | Status: AC
  Administered 2016-09-15: 6 mg via SUBCUTANEOUS
  Filled 2016-09-15: qty 0.6

## 2016-09-15 MED ORDER — HEPARIN SOD (PORK) LOCK FLUSH 100 UNIT/ML IV SOLN
INTRAVENOUS | Status: AC
  Filled 2016-09-15: qty 5

## 2016-09-15 MED ORDER — DEXAMETHASONE SODIUM PHOSPHATE 10 MG/ML IJ SOLN
INTRAMUSCULAR | Status: AC
  Filled 2016-09-15: qty 1

## 2016-09-15 MED ORDER — DIPHENHYDRAMINE HCL 50 MG/ML IJ SOLN
25.0000 mg | Freq: Once | INTRAMUSCULAR | Status: AC
  Administered 2016-09-15: 25 mg via INTRAVENOUS

## 2016-09-15 MED ORDER — FAMOTIDINE IN NACL 20-0.9 MG/50ML-% IV SOLN
INTRAVENOUS | Status: AC
  Filled 2016-09-15: qty 50

## 2016-09-15 NOTE — Assessment & Plan Note (Addendum)
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  Currently on Jevtana beginning on 01/28/2016.  Osseous disease has progressed on June 2018 restaging scans, but with limited options, stable PSA, and good performance status without osseous symptoms, will continue with Jevtana.  Pre-treatment labs today: CBC diff, CMET, PSA.  I personally reviewed and went over laboratory results with the patient.  The results are noted within this dictation.  Labs satisfy treatment parameters today.  Nursing, in accordance with chemotherapy administration protocol, will monitor for acute side effects/toxicities associated with chemotherapy administration today.  I personally reviewed and went over radiographic studies with the patient.  The results are noted within this dictation.  I personally reviewed the images in PACS.  Osseous uptake on bone scan has increased.  Patient is asymptomatic.  If patient develops bone symptoms, will transition to [facility] treatment.  He reports not needing any Rx refills today.  Return in 21 days for follow-up and next treatment.

## 2016-09-15 NOTE — Patient Instructions (Signed)
[place] at [facility] Discharge Instructions  RECOMMENDATIONS MADE BY THE CONSULTANT AND ANY TEST RESULTS WILL BE SENT TO YOUR REFERRING PHYSICIAN.  You were seen today by Kirby Crigler PA-C. Treatment today. Return in 3 weeks for labs, treatment and follow up.   Thank you for choosing [place] at [facility] to provide your oncology and hematology care.  To afford each patient quality time with our provider, please arrive at least 15 minutes before your scheduled appointment time.    If you have a lab appointment with the [facility] please come in thru the  Main Entrance and check in at the main information desk  You need to re-schedule your appointment should you arrive 10 or more minutes late.  We strive to give you quality time with our providers, and arriving late affects you and other patients whose appointments are after yours.  Also, if you no show three or more times for appointments you may be dismissed from the clinic at the providers discretion.     Again, thank you for choosing [facility].  Our hope is that these requests will decrease the amount of time that you wait before being seen by our physicians.       _____________________________________________________________  Should you have questions after your visit to [facility], please contact our office at [phone] between the hours of 8:30 a.m. and 4:30 p.m.  Voicemails left after 4:30 p.m. will not be returned until the following business day.  For prescription refill requests, have your pharmacy contact our office.       Resources For Cancer Patients and their Caregivers ? American Cancer Society: Can assist with transportation, wigs, general needs, runs Look Good Feel Better.        865-799-4706 ? Cancer Care: Provides financial assistance, online support groups, medication/co-pay assistance.  1-800-813-HOPE 929-782-5454) ? [facility] Assists [place] cancer patients and their families through emotional , educational and financial support.  430-376-2475 ? [place] DSS Where to apply for food stamps, Medicaid and utility assistance. (470) 291-9796 ? RCATS: Transportation to medical appointments. 419-448-8673 ? Social Security Administration: May apply for disability if have a Stage IV cancer. [phone] [phone] ? [employer], Disability and Transit Services: Assists with nutrition, care and transit needs. [place] Support Programs: @10RELATIVEDAYS @ > Cancer Support Group  2nd Tuesday of the month 1pm-2pm, Journey Room  > Creative Journey  3rd Tuesday of the month 1130am-1pm, Journey Room  > Look Good Feel Better  1st Wednesday of the month 10am-12 noon, Journey Room (Call [place] to register [phone])

## 2016-09-15 NOTE — Progress Notes (Signed)
Jevtana given today per orders. Patient tolerated it well, no issues. Marland KitchenCristy Friedlander arrived today for [facility] neulasta on body injector. See MAR for administration details. Injector in place and engaged with green light indicator on flashing. Tolerated application with out problems. Vitals stable and discharged home from clinic ambulatory. Follow up as scheduled.

## 2016-09-15 NOTE — Patient Instructions (Signed)
[facility] Discharge Instructions for Patients Receiving Chemotherapy   Beginning January 23rd 2017 lab work for the Cancer Center will be done in the  Main lab at  on 1st floor. If you have a lab appointment with the Cancer Center please come in thru the  Main Entrance and check in at the main information desk   Today you received the following chemotherapy agents   To help prevent nausea and vomiting after your treatment, we encourage you to take your nausea medication     If you develop nausea and vomiting, or diarrhea that is not controlled by your medication, call the clinic.  The clinic phone number is [phone]. Office hours are Monday-Friday 8:30am-5:00pm.  BELOW ARE SYMPTOMS THAT SHOULD BE REPORTED IMMEDIATELY:  *FEVER GREATER THAN 101.0 F  *CHILLS WITH OR WITHOUT FEVER  NAUSEA AND VOMITING THAT IS NOT CONTROLLED WITH YOUR NAUSEA MEDICATION  *UNUSUAL SHORTNESS OF BREATH  *UNUSUAL BRUISING OR BLEEDING  TENDERNESS IN MOUTH AND THROAT WITH OR WITHOUT PRESENCE OF ULCERS  *URINARY PROBLEMS  *BOWEL PROBLEMS  UNUSUAL RASH Items with * indicate a potential emergency and should be followed up as soon as possible. If you have an emergency after office hours please contact your primary care physician or go to the nearest emergency department.  Please call the clinic during office hours if you have any questions or concerns.   You may also contact the Patient Navigator at [phone] should you have any questions or need assistance in obtaining follow up care.      Resources For Cancer Patients and their Caregivers ? American Cancer Society: Can assist with transportation, wigs, general needs, runs Look Good Feel Better.        1-888-227-6333 ? Cancer Care: Provides financial assistance, online support groups, medication/co-pay assistance.  1-800-813-HOPE (4673) ? [facility] Assists [place] cancer  patients and their families through emotional , educational and financial support.  336-427-4357 ? [place] DSS Where to apply for food stamps, Medicaid and utility assistance. 336-342-1394 ? RCATS: Transportation to medical appointments. 336-347-2287 ? Social Security Administration: May apply for disability if have a Stage IV cancer. 336-342-7796 1-800-772-1213 ? [place] Aging, Disability and Transit Services: Assists with nutrition, care and transit needs. 336-349-2343         

## 2016-09-15 NOTE — Progress Notes (Signed)
[name], MD [place] [address]  Prostate cancer metastatic to bone [facility]) - Plan: PSA  CURRENT THERAPY: Jevtana beginning on 01/28/2016  INTERVAL HISTORY: Micheal Davis 71 y.o. 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    Prostate cancer metastatic to bone ([place])   10/01/2012 Initial Diagnosis    Prostate biopsied with highest Gleason score of 9 seen and the lowest score was 7.      10/04/2012 - 05/16/2013 Chemotherapy    Depo-Lupron and Casodex initiated      05/16/2013 -  Chemotherapy    Depo-Lupron monthly continued      05/16/2013 Progression    Progression by PSA elevation      05/16/2013 - 10/22/2014 Chemotherapy    Abiraterone and prednisone initiated in conjunction with ongoing Depo-Lupron.  Denosumab also ongoing.      10/23/2014 Progression    PSA increasing from 0.2- 1.6 in less than 6 months.        10/23/2014 - 01/30/2015 Chemotherapy    Enzalutamide and Prednisone (5 mg in AM and 2.5 mg in PM)      01/30/2015 Imaging    Bone scan- New focus of intense activity in right proximal humerus.  Interim increase in activity over left hip.      01/30/2015 Progression    Bone scan reveals new disease in right humerus consistent with progression of disease      01/31/2015 Imaging    Right humerus xray- blastic foci in proximal right humeral metaphysis and over right mid-humeral diaphysis.  No evidence of fracture      07/06/2015 Progression    Progression in multiple bones especially L hip and femurs      07/06/2015 Imaging    Bone scan- progressive multifocal osseous metastases in the right proximal femora and distal femoral shafts.  Stable update in bilateral ribs suspicious for small rib metastases      07/16/2015 - 07/31/2015 Radiation  Therapy    Left femur 30 Gy in 10 fractions by Dr. Tammi Klippel      11/23/2015 - 01/04/2016 Chemotherapy    The patient had pegfilgrastim (NEULASTA ONPRO KIT) injection 6 mg, 6 mg, Subcutaneous, Once, 3 of 7 cycles  DOCEtaxel (TAXOTERE) 180 mg in dextrose 5 % 250 mL chemo infusion, 75 mg/m2 = 180 mg, Intravenous,  Once, 3 of 7 cycles Dose modification: 64 mg/m2 (original dose 75 mg/m2, Cycle 2, Reason: Dose not tolerated)  pegfilgrastim (NEULASTA ONPRO KIT) injection 6 mg, 6 mg, Subcutaneous, Once, 0 of 4 cycles  cabazitaxel (JEVTANA) 60 mg in dextrose 5 % 250 mL chemo infusion, 25 mg/m2, Intravenous,  Once, 0 of 4 cycles  for chemotherapy treatment.        11/30/2015 Adverse Reaction    Diarrhea (secondary to chemotherapy) and dehydration requiring IV fluids      12/14/2015 Treatment Plan Change    Docetaxel dose reduced by 15%      12/31/2015 Procedure    Port placed by Dr. Arnoldo Morale      01/28/2016 -  Chemotherapy    Cabazitaxel (Jevtana)       03/27/2016 Imaging    CT Chest, Abdomen, and Pelvis with contrast 1. Diffuse sclerotic osseous metastatic disease in the chest, abdomen, and pelvis without acute fracture identified. 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 These appear chronic. 2. The prostate gland is normal in size and no adenopathy is currently identified. 3. On a prior MRI of 10/04/2012, there was a posterior right hepatic lobe lesion. This lesion is not currently visible on today' s CT. This could be due to differences in cons acuity between CT or MRI, or resolution of the lesion. 4. Coronary, aortic arch, and branch vessel atherosclerotic vascular disease. Aortoiliac atherosclerotic vascular disease. 5. Single bilateral renal cysts.      04/16/2016 Imaging    Bone scan- Findings consist with progressive metastatic disease. Activity over the proximal right humerus and proximal bilateral femurs are worrisome for the possible  development of pathologic fractures.      08/21/2016 Imaging    CT C/A/P: IMPRESSION: No significant change since 03/27/2016 CT. Diffuse bony metastases without other evidence of metastatic disease.      08/21/2016 Imaging    Bone Scan: IMPRESSION: Multiple areas of increased activity again noted throughout the axial and appendicular skeleton in similar locations as prior exam. Intensity of uptake is increased from prior exam suggesting progressive disease. Lesions present in the proximal humeri, proximal femurs, and the mid right femur susceptible to pathologic fracture.       HPI Elements   Location: Prostate  Quality: Adenocarcinoma  Severity: Stage IV  Duration: Dx in August 2014  Context: Osseous involvement  Timing:   Modifying Factors:   Associated Signs & Symptoms:    He is doing well and denies any oncology related complaints.  Review of Systems  Constitutional: Negative.  Negative for chills, fever and weight loss.  HENT: Negative.   Eyes: Negative.   Respiratory: Negative.  Negative for cough.   Cardiovascular: Negative.  Negative for chest pain.  Gastrointestinal: Negative.  Negative for blood in stool, constipation, diarrhea, melena, nausea and vomiting.  Genitourinary: Negative.   Musculoskeletal: Negative.   Skin: Negative.   Neurological: Negative.  Negative for weakness.  Endo/Heme/Allergies: Negative.   Psychiatric/Behavioral: Negative.     Past Medical History:  Diagnosis Date  . Diabetes mellitus without complication ([place])   . Hypertension   . Prostate cancer ([place]) 09/05/2015  . Prostate cancer metastatic to bone ([place]) 09/05/2015  . Sleep apnea   . Thyroid disease     Past Surgical History:  Procedure Laterality Date  . CATARACT EXTRACTION    . PORTACATH PLACEMENT Left 12/31/2015   Procedure: INSERTION PORT-A-CATH LEFT SUBCLAVIAN;  Surgeon: Aviva Signs, MD;  Location: [facility];  Service: General;  Laterality: Left;  . REPLACEMENT TOTAL  KNEE Left     History reviewed. No pertinent family history.  Social History   Social History  . Marital status: Married    Spouse name: N/A  . Number of children: N/A  . Years of education: N/A   Social History Main Topics  . Smoking status: Never Smoker  . Smokeless tobacco: Never Used  . Alcohol use Yes     Comment: 1 beer each month  . Drug use: No  . Sexual activity: No     Comment: married   Other Topics Concern  . None   Social History Narrative  . None     PHYSICAL EXAMINATION  ECOG PERFORMANCE STATUS: 0 - Asymptomatic  There were no vitals filed for this visit.  Vitals - 1 value per visit 4/40/1027  SYSTOLIC 253  DIASTOLIC 87  Pulse 67  Temperature 97.6  Respirations 18  Weight (lb) 241.8  GENERAL:alert, no distress, well nourished, well developed, comfortable, cooperative, obese, smiling and in chemo-recliner, accompanied by wife. SKIN: skin color, texture, turgor are normal, no rashes or significant lesions HEAD: Normocephalic, No masses, lesions, tenderness or abnormalities EYES: normal, EOMI, Conjunctiva are pink and non-injected EARS: External ears normal OROPHARYNX:lips, buccal mucosa, and tongue normal and mucous membranes are moist  NECK: supple, no adenopathy, trachea midline LYMPH:  no palpable lymphadenopathy BREAST:not examined LUNGS: clear to auscultation  HEART: regular rate & rhythm, no murmurs and no gallops ABDOMEN:abdomen soft, non-tender, obese and normal bowel sounds BACK: Back symmetric, no curvature. EXTREMITIES:less then 2 second capillary refill, no joint deformities, effusion, or inflammation, no edema, no skin discoloration, no clubbing, no cyanosis  NEURO: alert & oriented x 3 with fluent speech, no focal motor/sensory deficits, gait normal   LABORATORY DATA: CBC    Component Value Date/Time   WBC 8.3 09/15/2016 0926   RBC 3.75 (L) 09/15/2016 0926   HGB 12.1 (L) 09/15/2016 0926   HCT 35.6 (L) 09/15/2016 0926    PLT 170 09/15/2016 0926   MCV 94.9 09/15/2016 0926   MCH 32.3 09/15/2016 0926   MCHC 34.0 09/15/2016 0926   RDW 14.8 09/15/2016 0926   LYMPHSABS 0.6 (L) 09/15/2016 0926   MONOABS 0.4 09/15/2016 0926   EOSABS 0.0 09/15/2016 0926   BASOSABS 0.0 09/15/2016 0926      Chemistry      Component Value Date/Time   NA 140 09/15/2016 0926   K 3.5 09/15/2016 0926   CL 106 09/15/2016 0926   CO2 25 09/15/2016 0926   BUN 13 09/15/2016 0926   CREATININE 0.78 09/15/2016 0926      Component Value Date/Time   CALCIUM 8.9 09/15/2016 0926   ALKPHOS 76 09/15/2016 0926   AST 25 09/15/2016 0926   ALT 24 09/15/2016 0926   BILITOT 0.7 09/15/2016 0926     Lab Results  Component Value Date   PSA 66.58 (H) 07/14/2016   PSA 62.25 (H) 06/23/2016   PSA 68.32 (H) 04/14/2016     PENDING LABS:   RADIOGRAPHIC STUDIES:  Ct Chest W Contrast  Result Date: 08/21/2016 CLINICAL DATA:  71 year old male for restaging of prostate cancer. EXAM: CT CHEST, ABDOMEN, AND PELVIS WITH CONTRAST TECHNIQUE: Multidetector CT imaging of the chest, abdomen and pelvis was performed following the standard protocol during bolus administration of intravenous contrast. CONTRAST:  111m ISOVUE-300 IOPAMIDOL (ISOVUE-300) INJECTION 61% COMPARISON:  08/18/2016 and prior bone scans. 03/27/2016 and prior CTs FINDINGS: CT CHEST FINDINGS Cardiovascular: Normal heart size. Coronary artery and thoracic aortic atherosclerotic calcifications noted. No evidence of thoracic aortic aneurysm or pericardial effusion Mediastinum/Nodes: No enlarged mediastinal, hilar, or axillary lymph nodes. Thyroid gland, trachea, and esophagus demonstrate no significant findings. Lungs/Pleura: Very small subpleural nodules are unchanged. No suspicious nodule, mass, airspace disease, consolidation, pleural effusion or pneumothorax identified. Musculoskeletal: Unchanged sclerotic lesions within visualized shoulders, clavicles, scattered ribs and thoracic spine again  noted. CT ABDOMEN PELVIS FINDINGS Hepatobiliary: The liver is unremarkable. Cholelithiasis identified without CT evidence of acute cholecystitis. There is no evidence of biliary dilatation. Pancreas: Unremarkable Spleen: Unremarkable Adrenals/Urinary Tract: The kidneys, adrenal glands and bladder are unremarkable except for bilateral renal cysts. Stomach/Bowel: Stomach is within normal limits. Appendix appears normal. No evidence of bowel wall thickening, distention, or inflammatory changes. Vascular/Lymphatic: Aortic atherosclerosis. No enlarged abdominal or pelvic lymph nodes. Reproductive: Prostate is unremarkable. Other:  No ascites, focal collection or pneumoperitoneum. Musculoskeletal: Scattered sclerotic lesions throughout the visualized bony structures again noted  and not significantly changed. IMPRESSION: No significant change since 03/27/2016 CT. Diffuse bony metastases without other evidence of metastatic disease. Cholelithiasis. Aortic Atherosclerosis (ICD10-I70.0). Electronically Signed   By: Margarette Canada M.D.   On: 08/21/2016 13:45   Nm Bone Scan Whole Body  Result Date: 08/18/2016 CLINICAL DATA:  Prostate cancer. EXAM: NUCLEAR MEDICINE WHOLE BODY BONE SCAN TECHNIQUE: Whole body anterior and posterior images were obtained approximately 3 hours after intravenous injection of radiopharmaceutical. RADIOPHARMACEUTICALS:  20.0 MCi Technetium-60mMDP IV COMPARISON:  Bone scan 04/16/2016 . FINDINGS: Bilateral renal function excretion. Multiple areas of increased activity about the axial and appendicular skeleton again noted in similar locations to prior exams. Uptake intensity is increased from prior exam. Again areas of increased activity are noted in the proximal humeri and femurs as well as the right mid femoral shaft are susceptible to pathologic fracture . IMPRESSION: Multiple areas of increased activity again noted throughout the axial and appendicular skeleton in similar locations as prior exam.  Intensity of uptake is increased from prior exam suggesting progressive disease. Lesions present in the proximal humeri, proximal femurs, and the mid right femur susceptible to pathologic fracture. Electronically Signed   By: TMarcello Moores Register   On: 08/18/2016 13:15   Ct Abdomen Pelvis W Contrast  Result Date: 08/21/2016 CLINICAL DATA:  71year old male for restaging of prostate cancer. EXAM: CT CHEST, ABDOMEN, AND PELVIS WITH CONTRAST TECHNIQUE: Multidetector CT imaging of the chest, abdomen and pelvis was performed following the standard protocol during bolus administration of intravenous contrast. CONTRAST:  1028mISOVUE-300 IOPAMIDOL (ISOVUE-300) INJECTION 61% COMPARISON:  08/18/2016 and prior bone scans. 03/27/2016 and prior CTs FINDINGS: CT CHEST FINDINGS Cardiovascular: Normal heart size. Coronary artery and thoracic aortic atherosclerotic calcifications noted. No evidence of thoracic aortic aneurysm or pericardial effusion Mediastinum/Nodes: No enlarged mediastinal, hilar, or axillary lymph nodes. Thyroid gland, trachea, and esophagus demonstrate no significant findings. Lungs/Pleura: Very small subpleural nodules are unchanged. No suspicious nodule, mass, airspace disease, consolidation, pleural effusion or pneumothorax identified. Musculoskeletal: Unchanged sclerotic lesions within visualized shoulders, clavicles, scattered ribs and thoracic spine again noted. CT ABDOMEN PELVIS FINDINGS Hepatobiliary: The liver is unremarkable. Cholelithiasis identified without CT evidence of acute cholecystitis. There is no evidence of biliary dilatation. Pancreas: Unremarkable Spleen: Unremarkable Adrenals/Urinary Tract: The kidneys, adrenal glands and bladder are unremarkable except for bilateral renal cysts. Stomach/Bowel: Stomach is within normal limits. Appendix appears normal. No evidence of bowel wall thickening, distention, or inflammatory changes. Vascular/Lymphatic: Aortic atherosclerosis. No enlarged  abdominal or pelvic lymph nodes. Reproductive: Prostate is unremarkable. Other:  No ascites, focal collection or pneumoperitoneum. Musculoskeletal: Scattered sclerotic lesions throughout the visualized bony structures again noted and not significantly changed. IMPRESSION: No significant change since 03/27/2016 CT. Diffuse bony metastases without other evidence of metastatic disease. Cholelithiasis. Aortic Atherosclerosis (ICD10-I70.0). Electronically Signed   By: JeMargarette Canada.D.   On: 08/21/2016 13:45     PATHOLOGY:    ASSESSMENT AND PLAN:  [facility] to [facility] prostate cancer with disease to bone, initially diagnosed in August 2014 with elevated PSA at 123 and proatate biopsy demonstrating a Gleason score of 4+5=9 with high-volume disease.  Currently on Jevtana beginning on 01/28/2016.  Osseous disease has progressed on June 2018 restaging scans, but with limited options, stable PSA, and good performance status without osseous symptoms, will continue with Jevtana.  Pre-treatment labs today: CBC diff, CMET, PSA.  I personally reviewed and went over laboratory results with the patient.  The results are noted  within this dictation.  Labs satisfy treatment parameters today.  Nursing, in accordance with chemotherapy administration protocol, will monitor for acute side effects/toxicities associated with chemotherapy administration today.  I personally reviewed and went over radiographic studies with the patient.  The results are noted within this dictation.  I personally reviewed the images in PACS.  Osseous uptake on bone scan has increased.  Patient is asymptomatic.  If patient develops bone symptoms, will transition to [facility] treatment.  He reports not needing any Rx refills today.  Return in 21 days for follow-up and next treatment.   ORDERS PLACED FOR THIS ENCOUNTER: Orders Placed This Encounter  Procedures  . PSA    MEDICATIONS  PRESCRIBED THIS ENCOUNTER: No orders of the defined types were placed in this encounter.   THERAPY PLAN:  Continue with palliative treatment as described above.  All questions were answered. The patient knows to call the clinic with any problems, questions or concerns. We can certainly see the patient much sooner if necessary.  Patient and plan discussed with Dr. Twana First and she is in agreement with the aforementioned.   This note is electronically signed by: Doy Mince 09/15/2016 10:53 AM

## 2016-10-02 ENCOUNTER — Encounter (HOSPITAL_COMMUNITY): Payer: Medicare Other | Attending: Hematology & Oncology

## 2016-10-02 ENCOUNTER — Encounter (HOSPITAL_COMMUNITY): Payer: Self-pay | Admitting: Adult Health

## 2016-10-02 ENCOUNTER — Encounter (HOSPITAL_COMMUNITY): Payer: Self-pay

## 2016-10-02 VITALS — BP 149/74 | HR 64 | Temp 98.2°F | Resp 20

## 2016-10-02 DIAGNOSIS — C61 Malignant neoplasm of prostate: Secondary | ICD-10-CM | POA: Diagnosis not present

## 2016-10-02 DIAGNOSIS — Z5111 Encounter for antineoplastic chemotherapy: Secondary | ICD-10-CM

## 2016-10-02 DIAGNOSIS — C7951 Secondary malignant neoplasm of bone: Secondary | ICD-10-CM

## 2016-10-02 LAB — COMPREHENSIVE METABOLIC PANEL
ALBUMIN: 3.8 g/dL (ref 3.5–5.0)
ALT: 20 U/L (ref 17–63)
AST: 23 U/L (ref 15–41)
Alkaline Phosphatase: 78 U/L (ref 38–126)
Anion gap: 10 (ref 5–15)
BUN: 14 mg/dL (ref 6–20)
CHLORIDE: 101 mmol/L (ref 101–111)
CO2: 25 mmol/L (ref 22–32)
CREATININE: 0.74 mg/dL (ref 0.61–1.24)
Calcium: 8.8 mg/dL — ABNORMAL LOW (ref 8.9–10.3)
GFR calc Af Amer: >60 mL/min (ref >60)
GLUCOSE: 123 mg/dL — AB (ref 65–99)
POTASSIUM: 3.6 mmol/L (ref 3.5–5.1)
Sodium: 136 mmol/L (ref 135–145)
Total Bilirubin: 0.7 mg/dL (ref 0.3–1.2)
Total Protein: 6.2 g/dL — ABNORMAL LOW (ref 6.5–8.1)

## 2016-10-02 LAB — CBC WITH DIFFERENTIAL/PLATELET
BASOS ABS: 0 10*3/uL (ref 0.0–0.1)
BASOS PCT: 0 %
Eosinophils Absolute: 0.1 10*3/uL (ref 0.0–0.7)
Eosinophils Relative: 1 %
HEMATOCRIT: 34.6 % — AB (ref 39.0–52.0)
HEMOGLOBIN: 11.6 g/dL — AB (ref 13.0–17.0)
Lymphocytes Relative: 10 %
Lymphs Abs: 0.8 10*3/uL (ref 0.7–4.0)
MCH: 32.2 pg (ref 26.0–34.0)
MCHC: 33.5 g/dL (ref 30.0–36.0)
MCV: 96.1 fL (ref 78.0–100.0)
Monocytes Absolute: 0.5 10*3/uL (ref 0.1–1.0)
Monocytes Relative: 6 %
NEUTROS ABS: 6.8 10*3/uL (ref 1.7–7.7)
NEUTROS PCT: 83 %
PLATELETS: 200 10*3/uL (ref 150–400)
RBC: 3.6 MIL/uL — AB (ref 4.22–5.81)
RDW: 15.1 % (ref 11.5–15.5)
WBC: 8.2 10*3/uL (ref 4.0–10.5)

## 2016-10-02 MED ORDER — SODIUM CHLORIDE 0.9% FLUSH
20.0000 mL | INTRAVENOUS | Status: DC | PRN
  Administered 2016-10-02: 20 mL via INTRAVENOUS
  Filled 2016-10-02: qty 20

## 2016-10-02 MED ORDER — MORPHINE SULFATE ER 30 MG PO TBCR: 30.0000 mg | EXTENDED_RELEASE_TABLET | Freq: Two times a day (BID) | ORAL | 0 refills | Status: DC

## 2016-10-02 MED ORDER — HEPARIN SOD (PORK) LOCK FLUSH 100 UNIT/ML IV SOLN
500.0000 [IU] | Freq: Once | INTRAVENOUS | Status: AC
  Administered 2016-10-02: 500 [IU] via INTRAVENOUS

## 2016-10-02 MED ORDER — LEUPROLIDE ACETATE 7.5 MG IM KIT
7.5000 mg | PACK | INTRAMUSCULAR | Status: DC
  Administered 2016-10-02: 7.5 mg via INTRAMUSCULAR
  Filled 2016-10-02: qty 7.5

## 2016-10-02 MED ORDER — OXYCODONE-ACETAMINOPHEN 10-325 MG PO TABS: 1.0000 | ORAL_TABLET | Freq: Four times a day (QID) | ORAL | 0 refills | Status: DC | PRN

## 2016-10-02 NOTE — Progress Notes (Signed)
Patient in for injection/infusion requesting refill of MS Contin and Percocet.     [place] Controlled Substance Reporting System reviewed and refill appropriate. Rxs printed and given to [place], RN:     Mike Craze, NP [place] [phone]

## 2016-10-02 NOTE — Progress Notes (Signed)
Micheal Davis tolerated Lupron injection and port lab draw well without complaints or incident. VSS Port accessed with blood drawn for labs then flushed with 20 ml NS and 5 ml Heparin easily per protocol then de-accessed. Pt doesn't want to wait on lab results today so he will get his Xgeva injection on Monday 10/06/16 when he receives his chemo tx if labs are within parameters. Pt discharged self ambulatory in satisfactory condition

## 2016-10-02 NOTE — Patient Instructions (Signed)
[place] at [facility] Discharge Instructions  RECOMMENDATIONS MADE BY THE CONSULTANT AND ANY TEST RESULTS WILL BE SENT TO YOUR REFERRING PHYSICIAN.  Lupron injection given today and labs drawn from portacath for use Monday 10/06/16 for chemo tx and Xgeva injection. Follow-up as scheduled. Call clinic for any questions or concerns  Thank you for choosing [place] at [facility] to provide your oncology and hematology care.  To afford each patient quality time with our provider, please arrive at least 15 minutes before your scheduled appointment time.    If you have a lab appointment with the [place] please come in thru the  Main Entrance and check in at the main information desk  You need to re-schedule your appointment should you arrive 10 or more minutes late.  We strive to give you quality time with our providers, and arriving late affects you and other patients whose appointments are after yours.  Also, if you no show three or more times for appointments you may be dismissed from the clinic at the providers discretion.     Again, thank you for choosing [facility].  Our hope is that these requests will decrease the amount of time that you wait before being seen by our physicians.       _____________________________________________________________  Should you have questions after your visit to [facility], please contact our office at [phone] between the hours of 8:30 a.m. and 4:30 p.m.  Voicemails left after 4:30 p.m. will not be returned until the following business day.  For prescription refill requests, have your pharmacy contact our office.       Resources For Cancer Patients and their Caregivers ? American Cancer Society: Can assist with transportation, wigs, general needs, runs Look Good Feel Better.        (650)430-2259 ? Cancer Care: Provides financial assistance, online support groups,  medication/co-pay assistance.  1-800-813-HOPE 3371682536) ? [place] Assists [place] cancer patients and their families through emotional , educational and financial support.  (614)583-7689 ? [place] DSS Where to apply for food stamps, Medicaid and utility assistance. 9132080318 ? [facility]: Transportation to medical appointments. 641-484-1409 ? Social Security Administration: May apply for disability if have a Stage IV cancer. 613-723-1679 704 816 5974 ? [employer], Disability and Transit Services: Assists with nutrition, care and transit needs. [place] Support Programs: @10RELATIVEDAYS @ > Cancer Support Group  2nd Tuesday of the month 1pm-2pm, Journey Room  > Creative Journey  3rd Tuesday of the month 1130am-1pm, Journey Room  > Look Good Feel Better  1st Wednesday of the month 10am-12 noon, Journey Room (Call [place] to register [phone])

## 2016-10-06 ENCOUNTER — Encounter (HOSPITAL_COMMUNITY): Payer: Self-pay

## 2016-10-06 ENCOUNTER — Encounter (HOSPITAL_BASED_OUTPATIENT_CLINIC_OR_DEPARTMENT_OTHER): Payer: Medicare Other

## 2016-10-06 ENCOUNTER — Encounter (HOSPITAL_BASED_OUTPATIENT_CLINIC_OR_DEPARTMENT_OTHER): Payer: Medicare Other | Admitting: Oncology

## 2016-10-06 VITALS — BP 142/80 | HR 70 | Temp 98.8°F | Resp 20 | Wt 241.6 lb

## 2016-10-06 DIAGNOSIS — C7951 Secondary malignant neoplasm of bone: Secondary | ICD-10-CM | POA: Diagnosis not present

## 2016-10-06 DIAGNOSIS — Z5111 Encounter for antineoplastic chemotherapy: Secondary | ICD-10-CM

## 2016-10-06 DIAGNOSIS — C61 Malignant neoplasm of prostate: Secondary | ICD-10-CM

## 2016-10-06 MED ORDER — SODIUM CHLORIDE 0.9 % IV SOLN
Freq: Once | INTRAVENOUS | Status: AC
Start: 2016-10-06 — End: 2016-10-06
  Administered 2016-10-06: 11:00:00 via INTRAVENOUS

## 2016-10-06 MED ORDER — DIPHENHYDRAMINE HCL 50 MG/ML IJ SOLN
INTRAMUSCULAR | Status: AC
  Filled 2016-10-06: qty 1

## 2016-10-06 MED ORDER — FAMOTIDINE IN NACL 20-0.9 MG/50ML-% IV SOLN
INTRAVENOUS | Status: AC
  Filled 2016-10-06: qty 50

## 2016-10-06 MED ORDER — FAMOTIDINE IN NACL 20-0.9 MG/50ML-% IV SOLN
20.0000 mg | Freq: Once | INTRAVENOUS | Status: AC
  Administered 2016-10-06: 20 mg via INTRAVENOUS

## 2016-10-06 MED ORDER — PEGFILGRASTIM 6 MG/0.6ML ~~LOC~~ PSKT
PREFILLED_SYRINGE | SUBCUTANEOUS | Status: AC
  Filled 2016-10-06: qty 0.6

## 2016-10-06 MED ORDER — DEXAMETHASONE SODIUM PHOSPHATE 10 MG/ML IJ SOLN
INTRAMUSCULAR | Status: AC
  Filled 2016-10-06: qty 1

## 2016-10-06 MED ORDER — DENOSUMAB 120 MG/1.7ML ~~LOC~~ SOLN
120.0000 mg | Freq: Once | SUBCUTANEOUS | Status: AC
  Administered 2016-10-06: 120 mg via SUBCUTANEOUS
  Filled 2016-10-06: qty 1.7

## 2016-10-06 MED ORDER — SODIUM CHLORIDE 0.9% FLUSH
10.0000 mL | INTRAVENOUS | Status: DC | PRN
  Administered 2016-10-06: 10 mL
  Filled 2016-10-06: qty 10

## 2016-10-06 MED ORDER — PEGFILGRASTIM 6 MG/0.6ML ~~LOC~~ PSKT
6.0000 mg | PREFILLED_SYRINGE | Freq: Once | SUBCUTANEOUS | Status: AC
  Administered 2016-10-06: 6 mg via SUBCUTANEOUS

## 2016-10-06 MED ORDER — DEXAMETHASONE SODIUM PHOSPHATE 10 MG/ML IJ SOLN
10.0000 mg | Freq: Once | INTRAMUSCULAR | Status: AC
  Administered 2016-10-06: 10 mg via INTRAVENOUS

## 2016-10-06 MED ORDER — HEPARIN SOD (PORK) LOCK FLUSH 100 UNIT/ML IV SOLN
500.0000 [IU] | Freq: Once | INTRAVENOUS | Status: AC | PRN
  Administered 2016-10-06: 500 [IU]
  Filled 2016-10-06: qty 5

## 2016-10-06 MED ORDER — DEXTROSE 5 % IV SOLN
20.0000 mg/m2 | Freq: Once | INTRAVENOUS | Status: AC
  Administered 2016-10-06: 48 mg via INTRAVENOUS
  Filled 2016-10-06: qty 4.8

## 2016-10-06 MED ORDER — DIPHENHYDRAMINE HCL 50 MG/ML IJ SOLN
25.0000 mg | Freq: Once | INTRAMUSCULAR | Status: AC
  Administered 2016-10-06: 11:00:00 via INTRAVENOUS

## 2016-10-06 NOTE — Patient Instructions (Addendum)
[facility] Discharge Instructions for Patients Receiving Chemotherapy   If you have a lab appointment with the [place] please come in thru the  Main Entrance and check in at the main information desk   Today you received the following chemotherapy agents: Jevtana You had your Neulasta Onpro placed. You can safely remove it at the time discussed tomorrow evening.    To help prevent nausea and vomiting after your treatment, we encourage you to take your nausea medication as prescribed.   If you develop nausea and vomiting, or diarrhea that is not controlled by your medication, call the clinic.  The clinic phone number is [phone]. Office hours are Monday-Friday 8:30am-5:00pm.  BELOW ARE SYMPTOMS THAT SHOULD BE REPORTED IMMEDIATELY:  *FEVER GREATER THAN 101.0 F  *CHILLS WITH OR WITHOUT FEVER  NAUSEA AND VOMITING THAT IS NOT CONTROLLED WITH YOUR NAUSEA MEDICATION  *UNUSUAL SHORTNESS OF BREATH  *UNUSUAL BRUISING OR BLEEDING  TENDERNESS IN MOUTH AND THROAT WITH OR WITHOUT PRESENCE OF ULCERS  *URINARY PROBLEMS  *BOWEL PROBLEMS  UNUSUAL RASH Items with * indicate a potential emergency and should be followed up as soon as possible. If you have an emergency after office hours please contact your primary care physician or go to the nearest emergency department.  Please call the clinic during office hours if you have any questions or concerns.   You may also contact the Patient Navigator at [phone] should you have any questions or need assistance in obtaining follow up care.      Resources For Cancer Patients and their Caregivers ? American Cancer Society: Can assist with transportation, wigs, general needs, runs Look Good Feel Better.        567 726 3453 ? Cancer Care: Provides financial assistance, online support groups, medication/co-pay assistance.  1-800-813-HOPE (934) 293-1794) ? [place] Assists [place] cancer  patients and their families through emotional , educational and financial support.  (712)209-9785 ? [place] DSS Where to apply for food stamps, Medicaid and utility assistance. 312 183 2395 ? [employer]: Transportation to medical appointments. 9362987303 ? Social Security Administration: May apply for disability if have a Stage IV cancer. 571-221-6347 864-875-2848 ? [employer], Disability and Transit Services: Assists with nutrition, care and transit needs. [phone]

## 2016-10-06 NOTE — Progress Notes (Signed)
Patient presents today for chemotherapy today per MD orders. Patient also to get Xgeva injection per orders. Patient tolerated chemotherapy infusion without incidence.  Xgeva 120 mg given in left lower abdomen. Patient tolerated without incidence. Neulasta On pro placed on patient's right upper arm. Patient instructed on when to remove on pro device. Device chamber was full and light was blinking green when patient was discharged from clinic. Patient discharged ambulatory and in stable condition from clinic with wife to home. Patient to follow up as scheduled.

## 2016-10-06 NOTE — Progress Notes (Signed)
Micheal Labrum, MD [place] [id number]  No diagnosis found.  CURRENT THERAPY: Jevtana beginning on 01/28/2016  INTERVAL HISTORY: Micheal Davis 71 y.o. 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    Prostate cancer metastatic to bone ([place])   10/01/2012 Initial Diagnosis    Prostate biopsied with highest Gleason score of 9 seen and the lowest score was 7.      10/04/2012 - 05/16/2013 Chemotherapy    Depo-Lupron and Casodex initiated      05/16/2013 -  Chemotherapy    Depo-Lupron monthly continued      05/16/2013 Progression    Progression by PSA elevation      05/16/2013 - 10/22/2014 Chemotherapy    Abiraterone and prednisone initiated in conjunction with ongoing Depo-Lupron.  Denosumab also ongoing.      10/23/2014 Progression    PSA increasing from 0.2- 1.6 in less than 6 months.        10/23/2014 - 01/30/2015 Chemotherapy    Enzalutamide and Prednisone (5 mg in AM and 2.5 mg in PM)      01/30/2015 Imaging    Bone scan- New focus of intense activity in right proximal humerus.  Interim increase in activity over left hip.      01/30/2015 Progression    Bone scan reveals new disease in right humerus consistent with progression of disease      01/31/2015 Imaging    Right humerus xray- blastic foci in proximal right humeral metaphysis and over right mid-humeral diaphysis.  No evidence of fracture      07/06/2015 Progression    Progression in multiple bones especially L hip and femurs      07/06/2015 Imaging    Bone scan- progressive multifocal osseous metastases in the right proximal femora and distal femoral shafts.  Stable update in bilateral ribs suspicious for small rib metastases      07/16/2015 - 07/31/2015 Radiation Therapy    Left femur 30 Gy in  10 fractions by Dr. Tammi Klippel      11/23/2015 - 01/04/2016 Chemotherapy    The patient had pegfilgrastim (NEULASTA ONPRO KIT) injection 6 mg, 6 mg, Subcutaneous, Once, 3 of 7 cycles  DOCEtaxel (TAXOTERE) 180 mg in dextrose 5 % 250 mL chemo infusion, 75 mg/m2 = 180 mg, Intravenous,  Once, 3 of 7 cycles Dose modification: 64 mg/m2 (original dose 75 mg/m2, Cycle 2, Reason: Dose not tolerated)  pegfilgrastim (NEULASTA ONPRO KIT) injection 6 mg, 6 mg, Subcutaneous, Once, 0 of 4 cycles  cabazitaxel (JEVTANA) 60 mg in dextrose 5 % 250 mL chemo infusion, 25 mg/m2, Intravenous,  Once, 0 of 4 cycles  for chemotherapy treatment.        11/30/2015 Adverse Reaction    Diarrhea (secondary to chemotherapy) and dehydration requiring IV fluids      12/14/2015 Treatment Plan Change    Docetaxel dose reduced by 15%      12/31/2015 Procedure    Port placed by Dr. Arnoldo Morale      01/28/2016 -  Chemotherapy    Cabazitaxel (Jevtana)       03/27/2016 Imaging    CT Chest, Abdomen, and Pelvis with contrast 1. Diffuse sclerotic osseous metastatic disease in the chest, abdomen, and pelvis without acute fracture identified. 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 These appear chronic. 2. The prostate gland is normal in size and no adenopathy is currently identified. 3. On a prior MRI of 10/04/2012, there was a posterior right hepatic lobe lesion. This lesion is not currently visible on today' s CT. This could be due to differences in cons acuity between CT or MRI, or resolution of the lesion. 4. Coronary, aortic arch, and branch vessel atherosclerotic vascular disease. Aortoiliac atherosclerotic vascular disease. 5. Single bilateral renal cysts.      04/16/2016 Imaging    Bone scan- Findings consist with progressive metastatic disease. Activity over the proximal right humerus and proximal bilateral femurs are worrisome for the possible development of pathologic  fractures.      08/21/2016 Imaging    CT C/A/P: IMPRESSION: No significant change since 03/27/2016 CT. Diffuse bony metastases without other evidence of metastatic disease.      08/21/2016 Imaging    Bone Scan: IMPRESSION: Multiple areas of increased activity again noted throughout the axial and appendicular skeleton in similar locations as prior exam. Intensity of uptake is increased from prior exam suggesting progressive disease. Lesions present in the proximal humeri, proximal femurs, and the mid right femur susceptible to pathologic fracture.       Patient presents today with his wife for continued follow-up as well as his Jevtana treatment. 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 Otherwise he denies any chest pain, shortness breath, abdominal pain. He denies any severe worsening fatigue. Appetite is good. He has neuropathy on his feet which sometimes causes him to have balance issues. But he does not have constant neuropathy in his feet.  Review of Systems  Constitutional: Negative.  Negative for chills, fever and weight loss.  HENT: Negative.   Eyes: Negative.   Respiratory: Negative.  Negative for cough.   Cardiovascular: Negative.  Negative for chest pain.  Gastrointestinal: Negative.  Negative for blood in stool, constipation, diarrhea, melena, nausea and vomiting.  Genitourinary: Negative.   Musculoskeletal: Negative.   Skin: Negative.   Neurological: Negative.  Negative for weakness.  Endo/Heme/Allergies: Negative.   Psychiatric/Behavioral: Negative.     Past Medical History:  Diagnosis Date  . Diabetes mellitus without complication ([place])   . Hypertension   . Prostate cancer ([place]) 09/05/2015  . Prostate cancer metastatic to bone ([place]) 09/05/2015  . Sleep apnea   . Thyroid disease     Past Surgical  History:  Procedure Laterality Date  . CATARACT EXTRACTION    . PORTACATH PLACEMENT Left 12/31/2015   Procedure: INSERTION PORT-A-CATH LEFT SUBCLAVIAN;  Surgeon: Aviva Signs, MD;  Location: [facility];  Service: General;  Laterality: Left;  . REPLACEMENT TOTAL KNEE Left     No family history on file.  Social History   Social History  . Marital status: Married    Spouse name: N/A  . Number of children: N/A  . Years of education: N/A   Social History Main Topics  . Smoking status: Never Smoker  . Smokeless tobacco: Never Used  . Alcohol use Yes     Comment: 1 beer each month  . Drug use: No  . Sexual activity: No     Comment: married   Other Topics Concern  . Not on file   Social History Narrative  . No narrative on file  PHYSICAL EXAMINATION  ECOG PERFORMANCE STATUS: 0 - Asymptomatic  There were no vitals filed for this visit.  Vitals - 1 value per visit 03/13/347  SYSTOLIC 611  DIASTOLIC 87  Pulse 67  Temperature 97.6  Respirations 18  Weight (lb) 241.8    GENERAL:alert, no distress, well nourished, well developed, comfortable, cooperative, obese, smiling and in chemo-recliner, accompanied by wife. SKIN: skin color, texture, turgor are normal, no rashes or significant lesions HEAD: Normocephalic, No masses, lesions, tenderness or abnormalities EYES: normal, EOMI, Conjunctiva are pink and non-injected EARS: External ears normal OROPHARYNX:lips, buccal mucosa, and tongue normal and mucous membranes are moist  NECK: supple, no adenopathy, trachea midline LYMPH:  no palpable lymphadenopathy BREAST:not examined LUNGS: clear to auscultation  HEART: regular rate & rhythm, no murmurs and no gallops ABDOMEN:abdomen soft, non-tender, obese and normal bowel sounds BACK: Back symmetric, no curvature. EXTREMITIES:less then 2 second capillary refill, no joint deformities, effusion, or inflammation, no edema, no skin discoloration, no clubbing, no cyanosis  NEURO: alert  & oriented x 3 with fluent speech, no focal motor/sensory deficits, gait normal   LABORATORY DATA: CBC    Component Value Date/Time   WBC 8.2 10/02/2016 0844   RBC 3.60 (L) 10/02/2016 0844   HGB 11.6 (L) 10/02/2016 0844   HCT 34.6 (L) 10/02/2016 0844   PLT 200 10/02/2016 0844   MCV 96.1 10/02/2016 0844   MCH 32.2 10/02/2016 0844   MCHC 33.5 10/02/2016 0844   RDW 15.1 10/02/2016 0844   LYMPHSABS 0.8 10/02/2016 0844   MONOABS 0.5 10/02/2016 0844   EOSABS 0.1 10/02/2016 0844   BASOSABS 0.0 10/02/2016 0844      Chemistry      Component Value Date/Time   NA 136 10/02/2016 0844   K 3.6 10/02/2016 0844   CL 101 10/02/2016 0844   CO2 25 10/02/2016 0844   BUN 14 10/02/2016 0844   CREATININE 0.74 10/02/2016 0844      Component Value Date/Time   CALCIUM 8.8 (L) 10/02/2016 0844   ALKPHOS 78 10/02/2016 0844   AST 23 10/02/2016 0844   ALT 20 10/02/2016 0844   BILITOT 0.7 10/02/2016 0844     Lab Results  Component Value Date   PSA 66.58 (H) 07/14/2016   PSA 62.25 (H) 06/23/2016   PSA 68.32 (H) 04/14/2016     PENDING LABS:   RADIOGRAPHIC STUDIES:  No results found.   PATHOLOGY:    ASSESSMENT AND PLAN:  Castration resistant metastatic prostate cancer with disease to bone, initially diagnosed in August 2014 with elevated PSA at 123 and proatate biopsy demonstrating a Gleason score of 4+5=9 with high-volume disease.  Currently on Jevtana beginning on 01/28/2016. Osseous disease has progressed on June 2018 restaging scans, but with limited options, stable PSA, and good performance status without osseous symptoms, will continue with Jevtana.  -PSA has been stable. -Continued Jevtana. If the bone pain becomes more diffuse and worsens then we can consider Xofigo. -Last from today reviewed. -Return to clinic in 3 weeks for follow-up as well as his next cycle of Jevtana.  THERAPY PLAN:  Continue with palliative treatment as described above.  All questions were answered.  The patient knows to call the clinic with any problems, questions or concerns. We can certainly see the patient much sooner if necessary.   This note is electronically signed by: Twana First, MD 10/06/2016 10:08 AM

## 2016-10-07 ENCOUNTER — Other Ambulatory Visit (HOSPITAL_COMMUNITY): Payer: Self-pay | Admitting: Oncology

## 2016-10-07 DIAGNOSIS — C7951 Secondary malignant neoplasm of bone: Principal | ICD-10-CM

## 2016-10-07 DIAGNOSIS — C61 Malignant neoplasm of prostate: Secondary | ICD-10-CM

## 2016-10-23 ENCOUNTER — Other Ambulatory Visit (HOSPITAL_COMMUNITY): Payer: Self-pay | Admitting: Oncology

## 2016-10-26 NOTE — Progress Notes (Signed)
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      10/04/2012 - 05/16/2013 Chemotherapy    Depo-Lupron and Casodex initiated      05/16/2013 -  Chemotherapy    Depo-Lupron monthly continued      05/16/2013 Progression    Progression by PSA elevation      05/16/2013 - 10/22/2014 Chemotherapy    Abiraterone and prednisone initiated in conjunction with ongoing Depo-Lupron.  Denosumab also ongoing.      10/23/2014 Progression    PSA increasing from 0.2- 1.6 in less than 6 months.        10/23/2014 - 01/30/2015 Chemotherapy    Enzalutamide and Prednisone (5 mg in AM and 2.5 mg in PM)      01/30/2015 Imaging    Bone scan- New focus of intense activity in right proximal humerus.  Interim increase in activity over left hip.      01/30/2015 Progression    Bone scan reveals new disease in right humerus consistent with progression of disease      01/31/2015 Imaging    Right humerus xray- blastic foci in proximal right humeral metaphysis and over right mid-humeral diaphysis.  No evidence of fracture      07/06/2015 Progression    Progression in multiple bones especially L hip and femurs      07/06/2015 Imaging    Bone scan- progressive multifocal osseous metastases in the right proximal femora and distal femoral shafts.  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 Tammi Klippel      11/23/2015 - 01/04/2016 Chemotherapy    The patient had pegfilgrastim (NEULASTA ONPRO KIT) injection 6 mg, 6 mg, Subcutaneous, Once, 3 of 7 cycles  DOCEtaxel (TAXOTERE) 180 mg in dextrose 5 % 250 mL chemo infusion, 75 mg/m2 = 180 mg, Intravenous,  Once, 3 of 7 cycles Dose modification: 64 mg/m2 (original dose 75 mg/m2, Cycle 2, Reason: Dose not tolerated)  pegfilgrastim (NEULASTA ONPRO KIT) injection 6 mg, 6 mg, Subcutaneous, Once, 0 of 4 cycles  cabazitaxel (JEVTANA) 60 mg in dextrose 5 % 250 mL chemo infusion, 25 mg/m2, Intravenous,  Once, 0 of 4 cycles  for chemotherapy treatment.        11/30/2015 Adverse Reaction    Diarrhea (secondary to chemotherapy) and dehydration requiring IV fluids      12/14/2015 Treatment Plan Change    Docetaxel dose reduced by 15%      12/31/2015 Procedure    Port placed by Dr. Arnoldo Morale      01/28/2016 -  Chemotherapy    Cabazitaxel (Jevtana)       03/27/2016 Imaging    CT Chest, Abdomen, and Pelvis with contrast 1. Diffuse sclerotic osseous metastatic disease in the chest, abdomen, and pelvis without acute fracture identified. 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 These  appear chronic. 2. The prostate gland is normal in size and no adenopathy is currently identified. 3. On a prior MRI of 10/04/2012, there was a posterior right hepatic lobe lesion. This lesion is not currently visible on today' s CT. This could be due to differences in cons acuity between CT or MRI, or resolution of the lesion. 4. Coronary, aortic arch, and branch vessel atherosclerotic vascular disease. Aortoiliac atherosclerotic vascular disease. 5. Single bilateral renal cysts.      04/16/2016 Imaging    Bone scan- Findings consist with progressive metastatic disease. Activity over the proximal right humerus and proximal bilateral femurs are worrisome for the possible development of pathologic fractures.      08/21/2016 Imaging    CT  C/A/P: IMPRESSION: No significant change since 03/27/2016 CT. Diffuse bony metastases without other evidence of metastatic disease.      08/21/2016 Imaging    Bone Scan: IMPRESSION: Multiple areas of increased activity again noted throughout the axial and appendicular skeleton in similar locations as prior exam. Intensity of uptake is increased from prior exam suggesting progressive disease. Lesions present in the proximal humeri, proximal femurs, and the mid right femur susceptible to pathologic fracture.          INTERVAL HISTORY:  Mr. Tejada 71 y.o. male returns for routine follow-up and consideration for next cycle of chemotherapy.   Due next cycle of Jevtana today.   Overall, he tells me he has been feeling pretty well. Energy levels 50%; appetite 50%. Reports that he eats, "I just get full."  Weight is down ~20 lbs in past 2 months. He has chronic (L) knee, bilat leg and feet pain which is "a little worse" than previous.  He shares with me that he's been working in an attic lately and he thinks that has irritated his joints.  He has chronic peripheral neuropathy to his feet, which is "about the same."  He has some shortness of breath on exertion, which is also stable. Has occasional diarrhea, which is well-managed with Imodium.    Overall, he feels ready for next cycle of treatment today.     REVIEW OF SYSTEMS:  Review of Systems  Constitutional: Positive for fatigue and unexpected weight change. Negative for chills and fever.  HENT:  Negative.   Eyes: Negative.   Respiratory: Positive for shortness of breath. Negative for cough.   Cardiovascular: Negative.   Gastrointestinal: Positive for diarrhea. Negative for abdominal pain, blood in stool, constipation, nausea and vomiting.  Endocrine: Negative.   Genitourinary: Negative.  Negative for dysuria and hematuria.   Musculoskeletal: Positive for arthralgias.  Skin: Negative.  Negative for rash.  Neurological: Positive  for numbness.  Hematological: Negative.   Psychiatric/Behavioral: Negative.      PAST MEDICAL/SURGICAL HISTORY:  Past Medical History:  Diagnosis Date  . Diabetes mellitus without complication ([facility])   . Hypertension   . Prostate cancer ([facility]) 09/05/2015  . Prostate cancer metastatic to bone ([facility]) 09/05/2015  . Sleep apnea   . Thyroid disease    Past Surgical History:  Procedure Laterality Date  . CATARACT EXTRACTION    . PORTACATH PLACEMENT Left 12/31/2015   Procedure: INSERTION PORT-A-CATH LEFT SUBCLAVIAN;  Surgeon: Aviva Signs, MD;  Location: [facility];  Service: General;  Laterality: Left;  . REPLACEMENT TOTAL KNEE Left      SOCIAL HISTORY:  Social History   Social History  . Marital status: Married    Spouse name: N/A  . Number of children: N/A  . Years  of education: N/A   Occupational History  . Not on file.   Social History Main Topics  . Smoking status: Never Smoker  . Smokeless tobacco: Never Used  . Alcohol use Yes     Comment: 1 beer each month  . Drug use: No  . Sexual activity: No     Comment: married   Other Topics Concern  . Not on file   Social History Narrative  . No narrative on file    FAMILY HISTORY:  No family history on file.  CURRENT MEDICATIONS:  Outpatient Encounter Prescriptions as of 10/27/2016  Medication Sig  . atenolol (TENORMIN) 100 MG tablet Take 100 mg by mouth daily.  Marland Kitchen atorvastatin (LIPITOR) 20 MG tablet Take 10 mg by mouth daily.  . Cabazitaxel (JEVTANA IV) Inject into the vein. Every 3 weeks  . calcium carbonate (OS-CAL - DOSED IN MG OF ELEMENTAL CALCIUM) 1250 (500 Ca) MG tablet Take 2 tablets by mouth daily with breakfast.   . diltiazem (CARDIZEM CD) 300 MG 24 hr capsule Take 300 mg by mouth daily.  Marland Kitchen esomeprazole (NEXIUM) 20 MG capsule Take 1 capsule (20 mg total) by mouth daily at 12 noon.  . Glucosamine-Chondroit-Vit C-Mn (GLUCOSAMINE 1500 COMPLEX PO) Take 1 tablet by mouth 2 (two) times daily.   Marland Kitchen KLOR-CON M20 20 MEQ  tablet TAKE 1 TABLET BY MOUTH ONCE DAILY  . levothyroxine (SYNTHROID, LEVOTHROID) 25 MCG tablet Take 25 mcg by mouth daily before breakfast.  . lidocaine-prilocaine (EMLA) cream Apply to affected area once  . loperamide (IMODIUM) 1 MG/5ML solution Take 1 mg by mouth as needed for diarrhea or loose stools.  . magic mouthwash w/lidocaine SOLN 1 part of each of the following: Benadryl 12.34m /581m Viscous lidocaine 2%, Maalox. Swish and swallow 5 mL QID.  . Marland KitchenetFORMIN (GLUCOPHAGE) 500 MG tablet Take 500 mg by mouth 2 (two) times daily with a meal.  . morphine (MS CONTIN) 30 MG 12 hr tablet Take 1 tablet (30 mg total) by mouth every 12 (twelve) hours.  . Multiple Vitamin (MULTIVITAMIN WITH MINERALS) TABS tablet Take 1 tablet by mouth daily.  . Omega-3 Fatty Acids (FISH OIL) 1000 MG CAPS Take 1 capsule by mouth daily.  . ondansetron (ZOFRAN) 8 MG tablet Take 1 tablet (8 mg total) by mouth 2 (two) times daily as needed (Nausea or vomiting).  . Marland KitchenxyCODONE-acetaminophen (PERCOCET) 10-325 MG tablet Take 1 tablet by mouth every 6 (six) hours as needed for pain.  . Marland Kitchenegfilgrastim (NEULASTA ONPRO [place]) Inject into the skin. Every 21 days  . predniSONE (DELTASONE) 5 MG tablet TAKE 1 TABLET BY MOUTH TWICE DAILY WITH A MEAL  . prochlorperazine (COMPAZINE) 10 MG tablet Take 1 tablet (10 mg total) by mouth every 6 (six) hours as needed (Nausea or vomiting).  . tamsulosin (FLOMAX) 0.4 MG CAPS capsule Take 0.8 mg by mouth daily.   . Marland Kitchenriamterene-hydrochlorothiazide (MAXZIDE-25) 37.5-25 MG tablet Take 1 tablet by mouth daily. for high blood pressure  . vitamin B-12 (CYANOCOBALAMIN) 500 MCG tablet Take by mouth.  . vitamin E 400 UNIT capsule Take 400 Units by mouth 2 (two) times daily.   . [DISCONTINUED] morphine (MS CONTIN) 30 MG 12 hr tablet Take 1 tablet (30 mg total) by mouth every 12 (twelve) hours.  . [DISCONTINUED] oxyCODONE-acetaminophen (PERCOCET) 10-325 MG tablet Take 1 tablet by mouth every 6 (six) hours as  needed for pain.   Facility-Administered Encounter Medications as of 10/27/2016  Medication  . [COMPLETED] 0.9 %  sodium  chloride infusion  . [COMPLETED] cabazitaxel (JEVTANA) 48 mg in dextrose 5 % 250 mL chemo infusion  . [COMPLETED] dexamethasone (DECADRON) injection 10 mg  . [COMPLETED] diphenhydrAMINE (BENADRYL) injection 25 mg  . [COMPLETED] famotidine (PEPCID) IVPB 20 mg premix  . [COMPLETED] heparin lock flush 100 unit/mL  . leuprolide (LUPRON) injection 7.5 mg  . [COMPLETED] pegfilgrastim (NEULASTA ONPRO KIT) injection 6 mg  . [DISCONTINUED] sodium chloride flush (NS) 0.9 % injection 10 mL    ALLERGIES:  No Known Allergies   PHYSICAL EXAM:  ECOG Performance status: 1-2 - Symptomatic; requires occasional assistance      Physical Exam  Constitutional: He is oriented to person, place, and time and well-developed, well-nourished, and in no distress.  Seen in chemo chair in infusion area   HENT:  Head: Normocephalic.  Mouth/Throat: Oropharynx is clear and moist.  Eyes: Conjunctivae are normal. No scleral icterus.  Neck: Normal range of motion. Neck supple.  Cardiovascular: Regular rhythm.   Bradycardic  Pulmonary/Chest: Effort normal and breath sounds normal. No respiratory distress.  Abdominal: Soft. Bowel sounds are normal. There is no tenderness.  Musculoskeletal: Normal range of motion. He exhibits no edema.  Lymphadenopathy:    He has no cervical adenopathy.       Right: No supraclavicular adenopathy present.       Left: No supraclavicular adenopathy present.  Neurological: He is alert and oriented to person, place, and time. No cranial nerve deficit.  Skin: Skin is warm and dry. No rash noted.  Psychiatric: Mood, memory, affect and judgment normal.  Nursing note and vitals reviewed.    LABORATORY DATA:  I have reviewed the labs as listed.  CBC    Component Value Date/Time   WBC 7.4 10/27/2016 1037   RBC 3.81 (L) 10/27/2016 1037   HGB 12.3 (L) 10/27/2016  1037   HCT 35.8 (L) 10/27/2016 1037   PLT 200 10/27/2016 1037   MCV 94.0 10/27/2016 1037   MCH 32.3 10/27/2016 1037   MCHC 34.4 10/27/2016 1037   RDW 14.6 10/27/2016 1037   LYMPHSABS 0.8 10/27/2016 1037   MONOABS 0.5 10/27/2016 1037   EOSABS 0.1 10/27/2016 1037   BASOSABS 0.0 10/27/2016 1037   CMP Latest Ref Rng & Units 10/27/2016 10/02/2016 09/15/2016  Glucose 65 - 99 mg/dL 157(H) 123(H) 180(H)  BUN 6 - 20 mg/dL '16 14 13  ' Creatinine 0.61 - 1.24 mg/dL 0.75 0.74 0.78  Sodium 135 - 145 mmol/L 139 136 140  Potassium 3.5 - 5.1 mmol/L 3.9 3.6 3.5  Chloride 101 - 111 mmol/L 103 101 106  CO2 22 - 32 mmol/L '25 25 25  ' Calcium 8.9 - 10.3 mg/dL 9.1 8.8(L) 8.9  Total Protein 6.5 - 8.1 g/dL 6.1(L) 6.2(L) 5.8(L)  Total Bilirubin 0.3 - 1.2 mg/dL 0.9 0.7 0.7  Alkaline Phos 38 - 126 U/L 80 78 76  AST 15 - 41 U/L '23 23 25  ' ALT 17 - 63 U/L '23 20 24   ' Results for DEMBA, NIGH (MRN [id number])   Ref. Range 02/18/2016 09:21 03/10/2016 08:44 04/14/2016 10:33 06/23/2016 10:38 07/14/2016 09:08  PSA Latest Ref Range: 0.00 - 4.00 ng/mL 88.36 (H) 96.28 (H) 68.32 (H) 62.25 (H) 66.58 (H)    PENDING LABS:    DIAGNOSTIC IMAGING:  *The following radiologic images and reports have been reviewed independently and agree with below findings.  CT chest/abd/pelvis: 08/21/16 CLINICAL DATA:  71 year old male for restaging of prostate cancer.  EXAM: CT CHEST, ABDOMEN, AND PELVIS WITH CONTRAST  TECHNIQUE: Multidetector  CT imaging of the chest, abdomen and pelvis was performed following the standard protocol during bolus administration of intravenous contrast.  CONTRAST:  171m ISOVUE-300 IOPAMIDOL (ISOVUE-300) INJECTION 61%  COMPARISON:  08/18/2016 and prior bone scans. 03/27/2016 and prior CTs  FINDINGS: CT CHEST FINDINGS  Cardiovascular: Normal heart size. Coronary artery and thoracic aortic atherosclerotic calcifications noted. No evidence of thoracic aortic aneurysm or pericardial  effusion  Mediastinum/Nodes: No enlarged mediastinal, hilar, or axillary lymph nodes. Thyroid gland, trachea, and esophagus demonstrate no significant findings.  Lungs/Pleura: Very small subpleural nodules are unchanged. No suspicious nodule, mass, airspace disease, consolidation, pleural effusion or pneumothorax identified.  Musculoskeletal: Unchanged sclerotic lesions within visualized shoulders, clavicles, scattered ribs and thoracic spine again noted.  CT ABDOMEN PELVIS FINDINGS  Hepatobiliary: The liver is unremarkable. Cholelithiasis identified without CT evidence of acute cholecystitis. There is no evidence of biliary dilatation.  Pancreas: Unremarkable  Spleen: Unremarkable  Adrenals/Urinary Tract: The kidneys, adrenal glands and bladder are unremarkable except for bilateral renal cysts.  Stomach/Bowel: Stomach is within normal limits. Appendix appears normal. No evidence of bowel wall thickening, distention, or inflammatory changes.  Vascular/Lymphatic: Aortic atherosclerosis. No enlarged abdominal or pelvic lymph nodes.  Reproductive: Prostate is unremarkable.  Other:  No ascites, focal collection or pneumoperitoneum.  Musculoskeletal: Scattered sclerotic lesions throughout the visualized bony structures again noted and not significantly changed.  IMPRESSION: No significant change since 03/27/2016 CT. Diffuse bony metastases without other evidence of metastatic disease.  Cholelithiasis.  Aortic Atherosclerosis (ICD10-I70.0).   Electronically Signed   By: JMargarette CanadaM.D.   On: 08/21/2016 13:45   Bone scan: 08/18/16 CLINICAL DATA:  Prostate cancer.  EXAM: NUCLEAR MEDICINE WHOLE BODY BONE SCAN  TECHNIQUE: Whole body anterior and posterior images were obtained approximately 3 hours after intravenous injection of radiopharmaceutical.  RADIOPHARMACEUTICALS:  20.0 MCi Technetium-955mDP IV  COMPARISON:  Bone scan 04/16/2016  .  FINDINGS: Bilateral renal function excretion. Multiple areas of increased activity about the axial and appendicular skeleton again noted in similar locations to prior exams. Uptake intensity is increased from prior exam. Again areas of increased activity are noted in the proximal humeri and femurs as well as the right mid femoral shaft are susceptible to pathologic fracture .  IMPRESSION: Multiple areas of increased activity again noted throughout the axial and appendicular skeleton in similar locations as prior exam. Intensity of uptake is increased from prior exam suggesting progressive disease. Lesions present in the proximal humeri, proximal femurs, and the mid right femur susceptible to pathologic fracture.   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 Initial PSA elevated at 123.  -Now on Jevtana with Neulasta OnPro support; tolerating relatively well.  -Most recent CT chest/abd/pelvis on 08/21/16 revealed stable disease with diffuse bony mets, but without any other evidence of visceral metastatic disease.  -PSA remains elevated, but is overall stable in the mid-60s; most recent PSA 68.0 on 09/15/16. PSA from today pending. Will continue to monitor PSA.  -Labs reviewed and adequate for treatment today.  -Repeat CT chest/abd/pelvis & bone scan due in 12/2016; orders placed today. -Continue Jevtana every 3 weeks.  -Return to cancer center in ~6 weeks after restaging scans for follow-up and with subsequent  chemo.   Diarrhea:  -Likely secondary to Jevtana.  -Continue Imodium PRN, which has been effective for him   Bone mets:  -Continue Xgeva injections monthly to help prevent skeletal-related events.   -Encouraged continued calcium supplementation.  Neoplasm related pain:  -Currently on MS Contin 30 mg Q12H and Percocet 10/325 prn. Pain effectively managed on this regimen.  He requests refills on both today.  -[place] Controlled Substance Reporting System reviewed. Post-dated paper prescriptions given to patient today.  -Encouraged him to continue to pay close attention to his bowel regimen while taking opiates.     Dispo:  -Restaging CT chest/abd/pelvis and bone scan due in 12/2016; orders placed today.  -Continue chemo every 3 weeks.  -Return to cancer center in ~6 weeks after restaging scans for follow-up and with subsequent chemo.    All questions were answered to patient's stated satisfaction. Encouraged patient to call with any new concerns or questions before his next visit to the cancer center and we can certain see him sooner, if needed.    Plan of care discussed with Dr. Talbert Cage, who agrees with the above aforementioned.    Orders placed this encounter:  Orders Placed This Encounter  Procedures  . CT Chest W Contrast  . CT ABDOMEN PELVIS W CONTRAST  . NM Bone Scan Whole Body      Mike Craze, NP [place] [phone]

## 2016-10-27 ENCOUNTER — Encounter (HOSPITAL_BASED_OUTPATIENT_CLINIC_OR_DEPARTMENT_OTHER): Payer: Medicare Other

## 2016-10-27 ENCOUNTER — Encounter (HOSPITAL_COMMUNITY): Payer: Self-pay

## 2016-10-27 ENCOUNTER — Encounter (HOSPITAL_BASED_OUTPATIENT_CLINIC_OR_DEPARTMENT_OTHER): Payer: Medicare Other | Admitting: Adult Health

## 2016-10-27 VITALS — BP 139/77 | HR 55 | Temp 97.8°F | Resp 18 | Wt 240.2 lb

## 2016-10-27 DIAGNOSIS — Z5111 Encounter for antineoplastic chemotherapy: Secondary | ICD-10-CM | POA: Diagnosis not present

## 2016-10-27 DIAGNOSIS — C61 Malignant neoplasm of prostate: Secondary | ICD-10-CM

## 2016-10-27 DIAGNOSIS — C7951 Secondary malignant neoplasm of bone: Secondary | ICD-10-CM

## 2016-10-27 DIAGNOSIS — R197 Diarrhea, unspecified: Secondary | ICD-10-CM

## 2016-10-27 DIAGNOSIS — G893 Neoplasm related pain (acute) (chronic): Secondary | ICD-10-CM

## 2016-10-27 LAB — COMPREHENSIVE METABOLIC PANEL
ALT: 23 U/L (ref 17–63)
AST: 23 U/L (ref 15–41)
Albumin: 3.9 g/dL (ref 3.5–5.0)
Alkaline Phosphatase: 80 U/L (ref 38–126)
Anion gap: 11 (ref 5–15)
BILIRUBIN TOTAL: 0.9 mg/dL (ref 0.3–1.2)
BUN: 16 mg/dL (ref 6–20)
CHLORIDE: 103 mmol/L (ref 101–111)
CO2: 25 mmol/L (ref 22–32)
CREATININE: 0.75 mg/dL (ref 0.61–1.24)
Calcium: 9.1 mg/dL (ref 8.9–10.3)
GFR calc Af Amer: >60 mL/min (ref >60)
GLUCOSE: 157 mg/dL — AB (ref 65–99)
POTASSIUM: 3.9 mmol/L (ref 3.5–5.1)
SODIUM: 139 mmol/L (ref 135–145)
Total Protein: 6.1 g/dL — ABNORMAL LOW (ref 6.5–8.1)

## 2016-10-27 LAB — CBC WITH DIFFERENTIAL/PLATELET
Basophils Absolute: 0 10*3/uL (ref 0.0–0.1)
Basophils Relative: 0 %
EOS ABS: 0.1 10*3/uL (ref 0.0–0.7)
EOS PCT: 1 %
HCT: 35.8 % — ABNORMAL LOW (ref 39.0–52.0)
HEMOGLOBIN: 12.3 g/dL — AB (ref 13.0–17.0)
LYMPHS ABS: 0.8 10*3/uL (ref 0.7–4.0)
Lymphocytes Relative: 11 %
MCH: 32.3 pg (ref 26.0–34.0)
MCHC: 34.4 g/dL (ref 30.0–36.0)
MCV: 94 fL (ref 78.0–100.0)
MONO ABS: 0.5 10*3/uL (ref 0.1–1.0)
MONOS PCT: 7 %
NEUTROS PCT: 81 %
Neutro Abs: 6 10*3/uL (ref 1.7–7.7)
Platelets: 200 10*3/uL (ref 150–400)
RBC: 3.81 MIL/uL — ABNORMAL LOW (ref 4.22–5.81)
RDW: 14.6 % (ref 11.5–15.5)
WBC: 7.4 10*3/uL (ref 4.0–10.5)

## 2016-10-27 LAB — PSA: PROSTATIC SPECIFIC ANTIGEN: 70.41 ng/mL — AB (ref 0.00–4.00)

## 2016-10-27 MED ORDER — DEXAMETHASONE SODIUM PHOSPHATE 10 MG/ML IJ SOLN
10.0000 mg | Freq: Once | INTRAMUSCULAR | Status: AC
  Administered 2016-10-27: 10 mg via INTRAVENOUS
  Filled 2016-10-27: qty 1

## 2016-10-27 MED ORDER — DIPHENHYDRAMINE HCL 50 MG/ML IJ SOLN
25.0000 mg | Freq: Once | INTRAMUSCULAR | Status: AC
  Administered 2016-10-27: 25 mg via INTRAVENOUS
  Filled 2016-10-27: qty 1

## 2016-10-27 MED ORDER — HEPARIN SOD (PORK) LOCK FLUSH 100 UNIT/ML IV SOLN
500.0000 [IU] | Freq: Once | INTRAVENOUS | Status: AC | PRN
  Administered 2016-10-27: 500 [IU]
  Filled 2016-10-27: qty 5

## 2016-10-27 MED ORDER — PEGFILGRASTIM 6 MG/0.6ML ~~LOC~~ PSKT
6.0000 mg | PREFILLED_SYRINGE | Freq: Once | SUBCUTANEOUS | Status: AC
  Administered 2016-10-27: 6 mg via SUBCUTANEOUS
  Filled 2016-10-27: qty 0.6

## 2016-10-27 MED ORDER — OXYCODONE-ACETAMINOPHEN 10-325 MG PO TABS: 1.0000 | ORAL_TABLET | Freq: Four times a day (QID) | ORAL | 0 refills | Status: DC | PRN

## 2016-10-27 MED ORDER — SODIUM CHLORIDE 0.9 % IV SOLN
Freq: Once | INTRAVENOUS | Status: AC
  Administered 2016-10-27: 12:00:00 via INTRAVENOUS

## 2016-10-27 MED ORDER — SODIUM CHLORIDE 0.9% FLUSH
10.0000 mL | INTRAVENOUS | Status: DC | PRN
  Administered 2016-10-27: 10 mL
  Filled 2016-10-27: qty 10

## 2016-10-27 MED ORDER — MORPHINE SULFATE ER 30 MG PO TBCR: 30.0000 mg | EXTENDED_RELEASE_TABLET | Freq: Two times a day (BID) | ORAL | 0 refills | Status: DC

## 2016-10-27 MED ORDER — DEXTROSE 5 % IV SOLN
20.0000 mg/m2 | Freq: Once | INTRAVENOUS | Status: AC
  Administered 2016-10-27: 48 mg via INTRAVENOUS
  Filled 2016-10-27: qty 4.8

## 2016-10-27 MED ORDER — FAMOTIDINE IN NACL 20-0.9 MG/50ML-% IV SOLN
20.0000 mg | Freq: Once | INTRAVENOUS | Status: AC
  Administered 2016-10-27: 20 mg via INTRAVENOUS
  Filled 2016-10-27: qty 50

## 2016-10-27 NOTE — Progress Notes (Signed)
Micheal Davis tolerated chemo with Neulasta on-pro well without complaints or incident. Labs reviewed prior to administering chemotherapy. Neulasta on-pro applied to pt's right arm with green indicator light flashing and VSS upon discharge. Pt discharged self ambulatory in satisfactory condition accompanied by his wife

## 2016-10-27 NOTE — Patient Instructions (Signed)
[facility] Discharge Instructions for Patients Receiving Chemotherapy   Beginning January 23rd 2017 lab work for the [facility] will be done in the  Main lab at [facility] on 1st floor. If you have a lab appointment with the [facility] please come in thru the  Main Entrance and check in at the main information desk   Today you received the following chemotherapy agents Jevtana as well as Neulasta on-pro. Follow-up as scheduled. Call clinic for any questions or concerns  To help prevent nausea and vomiting after your treatment, we encourage you to take your nausea medication   If you develop nausea and vomiting, or diarrhea that is not controlled by your medication, call the clinic.  The clinic phone number is [phone]. Office hours are Monday-Friday 8:30am-5:00pm.  BELOW ARE SYMPTOMS THAT SHOULD BE REPORTED IMMEDIATELY:  *FEVER GREATER THAN 101.0 F  *CHILLS WITH OR WITHOUT FEVER  NAUSEA AND VOMITING THAT IS NOT CONTROLLED WITH YOUR NAUSEA MEDICATION  *UNUSUAL SHORTNESS OF BREATH  *UNUSUAL BRUISING OR BLEEDING  TENDERNESS IN MOUTH AND THROAT WITH OR WITHOUT PRESENCE OF ULCERS  *URINARY PROBLEMS  *BOWEL PROBLEMS  UNUSUAL RASH Items with * indicate a potential emergency and should be followed up as soon as possible. If you have an emergency after office hours please contact your primary care physician or go to the nearest emergency department.  Please call the clinic during office hours if you have any questions or concerns.   You may also contact the Patient Navigator at [phone] should you have any questions or need assistance in obtaining follow up care.      Resources For Cancer Patients and their Caregivers ? American Cancer Society: Can assist with transportation, wigs, general needs, runs Look Good Feel Better.        260-798-9770 ? Cancer Care: Provides financial assistance, online support groups, medication/co-pay  assistance.  1-800-813-HOPE 858 214 3320) ? [facility] Assists [place] cancer patients and their families through emotional , educational and financial support.  (920)445-5886 ? [place] DSS Where to apply for food stamps, Medicaid and utility assistance. (219)349-9873 ? [facility]: Transportation to medical appointments. 3478546001 ? Social Security Administration: May apply for disability if have a Stage IV cancer. ([phone] 732-135-2034 ? [employer], Disability and Transit Services: Assists with nutrition, care and transit needs. [phone]

## 2016-10-29 ENCOUNTER — Ambulatory Visit (HOSPITAL_COMMUNITY): Payer: Medicare Other

## 2016-11-04 ENCOUNTER — Encounter (HOSPITAL_COMMUNITY): Payer: Medicare Other | Attending: Hematology & Oncology

## 2016-11-04 ENCOUNTER — Encounter (HOSPITAL_COMMUNITY): Payer: Self-pay

## 2016-11-04 ENCOUNTER — Encounter (HOSPITAL_COMMUNITY): Payer: Medicare Other

## 2016-11-04 DIAGNOSIS — C7951 Secondary malignant neoplasm of bone: Secondary | ICD-10-CM

## 2016-11-04 DIAGNOSIS — C61 Malignant neoplasm of prostate: Secondary | ICD-10-CM | POA: Insufficient documentation

## 2016-11-04 DIAGNOSIS — Z5111 Encounter for antineoplastic chemotherapy: Secondary | ICD-10-CM

## 2016-11-04 LAB — COMPREHENSIVE METABOLIC PANEL
ALT: 17 U/L (ref 17–63)
AST: 19 U/L (ref 15–41)
Albumin: 3.9 g/dL (ref 3.5–5.0)
Alkaline Phosphatase: 108 U/L (ref 38–126)
Anion gap: 9 (ref 5–15)
BUN: 20 mg/dL (ref 6–20)
CHLORIDE: 101 mmol/L (ref 101–111)
CO2: 27 mmol/L (ref 22–32)
Calcium: 9 mg/dL (ref 8.9–10.3)
Creatinine, Ser: 1.07 mg/dL (ref 0.61–1.24)
GFR calc non Af Amer: >60 mL/min (ref >60)
Glucose, Bld: 124 mg/dL — ABNORMAL HIGH (ref 65–99)
POTASSIUM: 3.9 mmol/L (ref 3.5–5.1)
SODIUM: 137 mmol/L (ref 135–145)
Total Bilirubin: 0.7 mg/dL (ref 0.3–1.2)
Total Protein: 6.1 g/dL — ABNORMAL LOW (ref 6.5–8.1)

## 2016-11-04 LAB — CBC WITH DIFFERENTIAL/PLATELET
BASOS ABS: 0.1 10*3/uL (ref 0.0–0.1)
Basophils Relative: 1 %
EOS ABS: 0.1 10*3/uL (ref 0.0–0.7)
EOS PCT: 1 %
HCT: 35.4 % — ABNORMAL LOW (ref 39.0–52.0)
Hemoglobin: 11.9 g/dL — ABNORMAL LOW (ref 13.0–17.0)
LYMPHS ABS: 1.3 10*3/uL (ref 0.7–4.0)
LYMPHS PCT: 13 %
MCH: 31.8 pg (ref 26.0–34.0)
MCHC: 33.6 g/dL (ref 30.0–36.0)
MCV: 94.7 fL (ref 78.0–100.0)
MONO ABS: 0.9 10*3/uL (ref 0.1–1.0)
Monocytes Relative: 9 %
Neutro Abs: 7.6 10*3/uL (ref 1.7–7.7)
Neutrophils Relative %: 76 %
PLATELETS: 185 10*3/uL (ref 150–400)
RBC: 3.74 MIL/uL — AB (ref 4.22–5.81)
RDW: 14.4 % (ref 11.5–15.5)
WBC: 9.8 10*3/uL (ref 4.0–10.5)

## 2016-11-04 MED ORDER — DENOSUMAB 120 MG/1.7ML ~~LOC~~ SOLN
120.0000 mg | Freq: Once | SUBCUTANEOUS | Status: AC
  Administered 2016-11-04: 120 mg via SUBCUTANEOUS
  Filled 2016-11-04: qty 1.7

## 2016-11-04 MED ORDER — LEUPROLIDE ACETATE 7.5 MG IM KIT
7.5000 mg | PACK | INTRAMUSCULAR | Status: DC
  Administered 2016-11-04: 7.5 mg via INTRAMUSCULAR
  Filled 2016-11-04: qty 7.5

## 2016-11-04 NOTE — Progress Notes (Signed)
Micheal Davis presents today for injection per MD orders. xgeva 120mg  administered SQ in right Abdomen. Administration without incident. Patient tolerated well.  Micheal Davis presents today for injection per MD orders. Lupron 7.5 mg administered IM in right upper buttocks Administration without incident. Patient tolerated well.  Treatment given per orders. Patient tolerated it well without problems. Vitals stable and discharged home from clinic ambulatory. Follow up as scheduled.

## 2016-11-04 NOTE — Patient Instructions (Signed)
[place] at [facility] Discharge Instructions  RECOMMENDATIONS MADE BY THE CONSULTANT AND ANY TEST RESULTS WILL BE SENT TO YOUR REFERRING PHYSICIAN.  Lupron and xgeva given today per orders Follow up as scheduled.  Thank you for choosing [place] at [facility] to provide your oncology and hematology care.  To afford each patient quality time with our provider, please arrive at least 15 minutes before your scheduled appointment time.    If you have a lab appointment with the [place] please come in thru the  Main Entrance and check in at the main information desk  You need to re-schedule your appointment should you arrive 10 or more minutes late.  We strive to give you quality time with our providers, and arriving late affects you and other patients whose appointments are after yours.  Also, if you no show three or more times for appointments you may be dismissed from the clinic at the providers discretion.     Again, thank you for choosing [facility].  Our hope is that these requests will decrease the amount of time that you wait before being seen by our physicians.       _____________________________________________________________  Should you have questions after your visit to [facility], please contact our office at [phone] between the hours of 8:30 a.m. and 4:30 p.m.  Voicemails left after 4:30 p.m. will not be returned until the following business day.  For prescription refill requests, have your pharmacy contact our office.       Resources For Cancer Patients and their Caregivers ? American Cancer Society: Can assist with transportation, wigs, general needs, runs Look Good Feel Better.        (226) 732-0837 ? Cancer Care: Provides financial assistance, online support groups, medication/co-pay assistance.  1-800-813-HOPE 206-819-1997) ? [place] Assists [place]  cancer patients and their families through emotional , educational and financial support.  270-605-0357 ? [place] DSS Where to apply for food stamps, Medicaid and utility assistance. 289-861-1631 ? [employer]: Transportation to medical appointments. (570)630-3338 ? Social Security Administration: May apply for disability if have a Stage IV cancer. 979-072-1472 705-094-7452 ? [employer], Disability and Transit Services: Assists with nutrition, care and transit needs. [place] Support Programs: @10RELATIVEDAYS @ > Cancer Support Group  2nd Tuesday of the month 1pm-2pm, Journey Room  > Creative Journey  3rd Tuesday of the month 1130am-1pm, Journey Room  > Look Good Feel Better  1st Wednesday of the month 10am-12 noon, Journey Room (Call [place] to register [phone])

## 2016-11-17 ENCOUNTER — Encounter (HOSPITAL_BASED_OUTPATIENT_CLINIC_OR_DEPARTMENT_OTHER): Payer: Medicare Other

## 2016-11-17 ENCOUNTER — Encounter (HOSPITAL_COMMUNITY): Payer: Self-pay

## 2016-11-17 VITALS — BP 175/85 | HR 57 | Temp 97.6°F | Resp 18 | Wt 236.7 lb

## 2016-11-17 DIAGNOSIS — C7951 Secondary malignant neoplasm of bone: Secondary | ICD-10-CM

## 2016-11-17 DIAGNOSIS — Z5111 Encounter for antineoplastic chemotherapy: Secondary | ICD-10-CM

## 2016-11-17 DIAGNOSIS — C61 Malignant neoplasm of prostate: Secondary | ICD-10-CM | POA: Diagnosis not present

## 2016-11-17 LAB — COMPREHENSIVE METABOLIC PANEL
ALBUMIN: 4 g/dL (ref 3.5–5.0)
ALK PHOS: 101 U/L (ref 38–126)
ALT: 21 U/L (ref 17–63)
ANION GAP: 13 (ref 5–15)
AST: 23 U/L (ref 15–41)
BUN: 15 mg/dL (ref 6–20)
CALCIUM: 9.3 mg/dL (ref 8.9–10.3)
CO2: 24 mmol/L (ref 22–32)
Chloride: 99 mmol/L — ABNORMAL LOW (ref 101–111)
Creatinine, Ser: 0.77 mg/dL (ref 0.61–1.24)
GFR calc Af Amer: >60 mL/min (ref >60)
GFR calc non Af Amer: >60 mL/min (ref >60)
GLUCOSE: 150 mg/dL — AB (ref 65–99)
Potassium: 3.7 mmol/L (ref 3.5–5.1)
SODIUM: 136 mmol/L (ref 135–145)
Total Bilirubin: 0.8 mg/dL (ref 0.3–1.2)
Total Protein: 6.5 g/dL (ref 6.5–8.1)

## 2016-11-17 LAB — CBC WITH DIFFERENTIAL/PLATELET
BASOS PCT: 0 %
Basophils Absolute: 0 10*3/uL (ref 0.0–0.1)
Eosinophils Absolute: 0 10*3/uL (ref 0.0–0.7)
Eosinophils Relative: 0 %
HCT: 37.2 % — ABNORMAL LOW (ref 39.0–52.0)
HEMOGLOBIN: 12.7 g/dL — AB (ref 13.0–17.0)
LYMPHS ABS: 1.1 10*3/uL (ref 0.7–4.0)
LYMPHS PCT: 12 %
MCH: 32.2 pg (ref 26.0–34.0)
MCHC: 34.1 g/dL (ref 30.0–36.0)
MCV: 94.4 fL (ref 78.0–100.0)
MONOS PCT: 7 %
Monocytes Absolute: 0.7 10*3/uL (ref 0.1–1.0)
NEUTROS ABS: 7.7 10*3/uL (ref 1.7–7.7)
NEUTROS PCT: 81 %
Platelets: 221 10*3/uL (ref 150–400)
RBC: 3.94 MIL/uL — ABNORMAL LOW (ref 4.22–5.81)
RDW: 14.7 % (ref 11.5–15.5)
WBC: 9.6 10*3/uL (ref 4.0–10.5)

## 2016-11-17 MED ORDER — DIPHENHYDRAMINE HCL 50 MG/ML IJ SOLN
INTRAMUSCULAR | Status: AC
  Filled 2016-11-17: qty 1

## 2016-11-17 MED ORDER — PEGFILGRASTIM 6 MG/0.6ML ~~LOC~~ PSKT
6.0000 mg | PREFILLED_SYRINGE | Freq: Once | SUBCUTANEOUS | Status: AC
  Administered 2016-11-17: 6 mg via SUBCUTANEOUS
  Filled 2016-11-17: qty 0.6

## 2016-11-17 MED ORDER — DEXAMETHASONE SODIUM PHOSPHATE 10 MG/ML IJ SOLN
10.0000 mg | Freq: Once | INTRAMUSCULAR | Status: AC
  Administered 2016-11-17: 10 mg via INTRAVENOUS

## 2016-11-17 MED ORDER — DIPHENHYDRAMINE HCL 50 MG/ML IJ SOLN
25.0000 mg | Freq: Once | INTRAMUSCULAR | Status: AC
  Administered 2016-11-17: 25 mg via INTRAVENOUS

## 2016-11-17 MED ORDER — HEPARIN SOD (PORK) LOCK FLUSH 100 UNIT/ML IV SOLN
500.0000 [IU] | Freq: Once | INTRAVENOUS | Status: AC | PRN
Start: 2016-11-17 — End: 2016-11-17
  Administered 2016-11-17: 500 [IU]
  Filled 2016-11-17: qty 5

## 2016-11-17 MED ORDER — FAMOTIDINE IN NACL 20-0.9 MG/50ML-% IV SOLN
20.0000 mg | Freq: Once | INTRAVENOUS | Status: AC
  Administered 2016-11-17: 20 mg via INTRAVENOUS

## 2016-11-17 MED ORDER — SODIUM CHLORIDE 0.9 % IV SOLN
Freq: Once | INTRAVENOUS | Status: AC
  Administered 2016-11-17: 10:00:00 via INTRAVENOUS

## 2016-11-17 MED ORDER — DEXTROSE 5 % IV SOLN
20.0000 mg/m2 | Freq: Once | INTRAVENOUS | Status: AC
  Administered 2016-11-17: 48 mg via INTRAVENOUS
  Filled 2016-11-17: qty 4.8

## 2016-11-17 MED ORDER — DEXAMETHASONE SODIUM PHOSPHATE 10 MG/ML IJ SOLN
INTRAMUSCULAR | Status: AC
  Filled 2016-11-17: qty 1

## 2016-11-17 MED ORDER — FAMOTIDINE IN NACL 20-0.9 MG/50ML-% IV SOLN
INTRAVENOUS | Status: AC
  Filled 2016-11-17: qty 50

## 2016-11-17 NOTE — Progress Notes (Signed)
All lab results reviewed with Dr. Talbert Cage.  Okay to tx today per MD.   Tolerated tx w/o adverse reaction.  Alert, in no distress.  VSS.  Discharged ambulatory in c/o spouse.

## 2016-12-02 ENCOUNTER — Encounter (HOSPITAL_COMMUNITY): Payer: Self-pay

## 2016-12-02 ENCOUNTER — Encounter (HOSPITAL_COMMUNITY): Payer: Medicare Other | Attending: Oncology

## 2016-12-02 ENCOUNTER — Encounter (HOSPITAL_COMMUNITY): Payer: Medicare Other

## 2016-12-02 ENCOUNTER — Ambulatory Visit (HOSPITAL_COMMUNITY)
Admission: RE | Admit: 2016-12-02 | Discharge: 2016-12-02 | Disposition: A | Discharge status: Home / Self Care | Payer: Medicare Other | Source: Ambulatory Visit | Attending: Adult Health | Admitting: Adult Health

## 2016-12-02 VITALS — BP 149/78 | HR 50 | Temp 98.1°F | Resp 18 | Wt 236.4 lb

## 2016-12-02 DIAGNOSIS — G629 Polyneuropathy, unspecified: Secondary | ICD-10-CM | POA: Diagnosis not present

## 2016-12-02 DIAGNOSIS — G4730 Sleep apnea, unspecified: Secondary | ICD-10-CM | POA: Insufficient documentation

## 2016-12-02 DIAGNOSIS — C61 Malignant neoplasm of prostate: Secondary | ICD-10-CM

## 2016-12-02 DIAGNOSIS — M47816 Spondylosis without myelopathy or radiculopathy, lumbar region: Secondary | ICD-10-CM | POA: Diagnosis not present

## 2016-12-02 DIAGNOSIS — M48061 Spinal stenosis, lumbar region without neurogenic claudication: Secondary | ICD-10-CM | POA: Insufficient documentation

## 2016-12-02 DIAGNOSIS — I700 Atherosclerosis of aorta: Secondary | ICD-10-CM | POA: Insufficient documentation

## 2016-12-02 DIAGNOSIS — K8020 Calculus of gallbladder without cholecystitis without obstruction: Secondary | ICD-10-CM | POA: Insufficient documentation

## 2016-12-02 DIAGNOSIS — Z5111 Encounter for antineoplastic chemotherapy: Secondary | ICD-10-CM | POA: Diagnosis present

## 2016-12-02 DIAGNOSIS — E079 Disorder of thyroid, unspecified: Secondary | ICD-10-CM | POA: Insufficient documentation

## 2016-12-02 DIAGNOSIS — M5136 Other intervertebral disc degeneration, lumbar region: Secondary | ICD-10-CM | POA: Diagnosis not present

## 2016-12-02 DIAGNOSIS — I2510 Atherosclerotic heart disease of native coronary artery without angina pectoris: Secondary | ICD-10-CM | POA: Insufficient documentation

## 2016-12-02 DIAGNOSIS — K769 Liver disease, unspecified: Secondary | ICD-10-CM | POA: Insufficient documentation

## 2016-12-02 DIAGNOSIS — N281 Cyst of kidney, acquired: Secondary | ICD-10-CM | POA: Insufficient documentation

## 2016-12-02 DIAGNOSIS — E1140 Type 2 diabetes mellitus with diabetic neuropathy, unspecified: Secondary | ICD-10-CM | POA: Diagnosis not present

## 2016-12-02 DIAGNOSIS — M4316 Spondylolisthesis, lumbar region: Secondary | ICD-10-CM | POA: Insufficient documentation

## 2016-12-02 DIAGNOSIS — Z9889 Other specified postprocedural states: Secondary | ICD-10-CM | POA: Insufficient documentation

## 2016-12-02 DIAGNOSIS — K5900 Constipation, unspecified: Secondary | ICD-10-CM | POA: Diagnosis not present

## 2016-12-02 DIAGNOSIS — Z96652 Presence of left artificial knee joint: Secondary | ICD-10-CM | POA: Insufficient documentation

## 2016-12-02 DIAGNOSIS — C7951 Secondary malignant neoplasm of bone: Secondary | ICD-10-CM | POA: Insufficient documentation

## 2016-12-02 DIAGNOSIS — Z9221 Personal history of antineoplastic chemotherapy: Secondary | ICD-10-CM | POA: Diagnosis not present

## 2016-12-02 DIAGNOSIS — R918 Other nonspecific abnormal finding of lung field: Secondary | ICD-10-CM | POA: Diagnosis not present

## 2016-12-02 DIAGNOSIS — M898X9 Other specified disorders of bone, unspecified site: Secondary | ICD-10-CM | POA: Insufficient documentation

## 2016-12-02 DIAGNOSIS — I119 Hypertensive heart disease without heart failure: Secondary | ICD-10-CM | POA: Diagnosis not present

## 2016-12-02 LAB — CBC WITH DIFFERENTIAL/PLATELET
Basophils Absolute: 0.1 10*3/uL (ref 0.0–0.1)
Basophils Relative: 1 %
EOS ABS: 0.1 10*3/uL (ref 0.0–0.7)
EOS PCT: 1 %
HCT: 38.2 % — ABNORMAL LOW (ref 39.0–52.0)
Hemoglobin: 12.7 g/dL — ABNORMAL LOW (ref 13.0–17.0)
LYMPHS ABS: 1.5 10*3/uL (ref 0.7–4.0)
LYMPHS PCT: 11 %
MCH: 31.9 pg (ref 26.0–34.0)
MCHC: 33.2 g/dL (ref 30.0–36.0)
MCV: 96 fL (ref 78.0–100.0)
MONO ABS: 0.6 10*3/uL (ref 0.1–1.0)
MONOS PCT: 4 %
Neutro Abs: 10.9 10*3/uL — ABNORMAL HIGH (ref 1.7–7.7)
Neutrophils Relative %: 83 %
PLATELETS: 184 10*3/uL (ref 150–400)
RBC: 3.98 MIL/uL — AB (ref 4.22–5.81)
RDW: 14.8 % (ref 11.5–15.5)
WBC: 13.1 10*3/uL — AB (ref 4.0–10.5)

## 2016-12-02 LAB — COMPREHENSIVE METABOLIC PANEL
ALK PHOS: 114 U/L (ref 38–126)
ALT: 19 U/L (ref 17–63)
AST: 22 U/L (ref 15–41)
Albumin: 4.2 g/dL (ref 3.5–5.0)
Anion gap: 9 (ref 5–15)
BUN: 16 mg/dL (ref 6–20)
CALCIUM: 9.6 mg/dL (ref 8.9–10.3)
CO2: 27 mmol/L (ref 22–32)
CREATININE: 0.81 mg/dL (ref 0.61–1.24)
Chloride: 101 mmol/L (ref 101–111)
Glucose, Bld: 123 mg/dL — ABNORMAL HIGH (ref 65–99)
Potassium: 4 mmol/L (ref 3.5–5.1)
Sodium: 137 mmol/L (ref 135–145)
Total Bilirubin: 0.6 mg/dL (ref 0.3–1.2)
Total Protein: 6.7 g/dL (ref 6.5–8.1)

## 2016-12-02 MED ORDER — IOPAMIDOL (ISOVUE-300) INJECTION 61%
100.0000 mL | Freq: Once | INTRAVENOUS | Status: AC | PRN
  Administered 2016-12-02: 100 mL via INTRAVENOUS

## 2016-12-02 MED ORDER — DENOSUMAB 120 MG/1.7ML ~~LOC~~ SOLN
120.0000 mg | Freq: Once | SUBCUTANEOUS | Status: AC
  Administered 2016-12-02: 120 mg via SUBCUTANEOUS
  Filled 2016-12-02: qty 1.7

## 2016-12-02 MED ORDER — LEUPROLIDE ACETATE 7.5 MG IM KIT
7.5000 mg | PACK | INTRAMUSCULAR | Status: DC
  Administered 2016-12-02: 7.5 mg via INTRAMUSCULAR
  Filled 2016-12-02: qty 7.5

## 2016-12-02 NOTE — Progress Notes (Signed)
Micheal Davis presents today for injection per the provider's orders.  Delton See and Luporn administrations without incident; see MAR for injection details.  Patient tolerated procedure well and without incident.  No questions or complaints noted at this time. Discharged ambulatory.

## 2016-12-03 ENCOUNTER — Encounter (HOSPITAL_COMMUNITY)
Admission: RE | Admit: 2016-12-03 | Discharge: 2016-12-03 | Disposition: A | Discharge status: Home / Self Care | Payer: Medicare Other | Source: Ambulatory Visit | Attending: Adult Health | Admitting: Adult Health

## 2016-12-03 ENCOUNTER — Encounter (HOSPITAL_COMMUNITY): Payer: Self-pay

## 2016-12-03 DIAGNOSIS — C7951 Secondary malignant neoplasm of bone: Secondary | ICD-10-CM | POA: Diagnosis not present

## 2016-12-03 DIAGNOSIS — C61 Malignant neoplasm of prostate: Secondary | ICD-10-CM | POA: Insufficient documentation

## 2016-12-03 MED ORDER — TECHNETIUM TC 99M MEDRONATE IV KIT
20.0000 | PACK | Freq: Once | INTRAVENOUS | Status: AC | PRN
  Administered 2016-12-03: 22 via INTRAVENOUS

## 2016-12-08 ENCOUNTER — Encounter (HOSPITAL_COMMUNITY): Payer: Self-pay | Admitting: Oncology

## 2016-12-08 ENCOUNTER — Encounter (HOSPITAL_BASED_OUTPATIENT_CLINIC_OR_DEPARTMENT_OTHER): Payer: Medicare Other | Admitting: Oncology

## 2016-12-08 ENCOUNTER — Encounter (HOSPITAL_BASED_OUTPATIENT_CLINIC_OR_DEPARTMENT_OTHER): Payer: Medicare Other

## 2016-12-08 VITALS — BP 140/69 | HR 54 | Temp 97.8°F | Resp 18 | Wt 234.2 lb

## 2016-12-08 DIAGNOSIS — C61 Malignant neoplasm of prostate: Secondary | ICD-10-CM | POA: Diagnosis not present

## 2016-12-08 DIAGNOSIS — C7951 Secondary malignant neoplasm of bone: Secondary | ICD-10-CM

## 2016-12-08 DIAGNOSIS — I119 Hypertensive heart disease without heart failure: Secondary | ICD-10-CM | POA: Diagnosis not present

## 2016-12-08 DIAGNOSIS — Z5111 Encounter for antineoplastic chemotherapy: Secondary | ICD-10-CM

## 2016-12-08 DIAGNOSIS — N281 Cyst of kidney, acquired: Secondary | ICD-10-CM | POA: Diagnosis not present

## 2016-12-08 DIAGNOSIS — K769 Liver disease, unspecified: Secondary | ICD-10-CM | POA: Diagnosis not present

## 2016-12-08 DIAGNOSIS — Z9221 Personal history of antineoplastic chemotherapy: Secondary | ICD-10-CM | POA: Diagnosis not present

## 2016-12-08 LAB — COMPREHENSIVE METABOLIC PANEL
ALBUMIN: 3.9 g/dL (ref 3.5–5.0)
ALK PHOS: 92 U/L (ref 38–126)
ALT: 22 U/L (ref 17–63)
ANION GAP: 12 (ref 5–15)
AST: 23 U/L (ref 15–41)
BUN: 18 mg/dL (ref 6–20)
CALCIUM: 9.7 mg/dL (ref 8.9–10.3)
CO2: 24 mmol/L (ref 22–32)
Chloride: 101 mmol/L (ref 101–111)
Creatinine, Ser: 0.85 mg/dL (ref 0.61–1.24)
GFR calc Af Amer: >60 mL/min (ref >60)
GFR calc non Af Amer: >60 mL/min (ref >60)
GLUCOSE: 125 mg/dL — AB (ref 65–99)
Potassium: 3.9 mmol/L (ref 3.5–5.1)
SODIUM: 137 mmol/L (ref 135–145)
Total Bilirubin: 0.8 mg/dL (ref 0.3–1.2)
Total Protein: 6.2 g/dL — ABNORMAL LOW (ref 6.5–8.1)

## 2016-12-08 LAB — CBC WITH DIFFERENTIAL/PLATELET
Basophils Absolute: 0 10*3/uL (ref 0.0–0.1)
Basophils Relative: 0 %
EOS PCT: 0 %
Eosinophils Absolute: 0 10*3/uL (ref 0.0–0.7)
HCT: 36.5 % — ABNORMAL LOW (ref 39.0–52.0)
Hemoglobin: 12.1 g/dL — ABNORMAL LOW (ref 13.0–17.0)
LYMPHS ABS: 0.9 10*3/uL (ref 0.7–4.0)
LYMPHS PCT: 11 %
MCH: 31.6 pg (ref 26.0–34.0)
MCHC: 33.2 g/dL (ref 30.0–36.0)
MCV: 95.3 fL (ref 78.0–100.0)
MONO ABS: 0.6 10*3/uL (ref 0.1–1.0)
Monocytes Relative: 7 %
Neutro Abs: 6.7 10*3/uL (ref 1.7–7.7)
Neutrophils Relative %: 82 %
Platelets: 197 10*3/uL (ref 150–400)
RBC: 3.83 MIL/uL — ABNORMAL LOW (ref 4.22–5.81)
RDW: 14.9 % (ref 11.5–15.5)
WBC: 8.2 10*3/uL (ref 4.0–10.5)

## 2016-12-08 LAB — PSA: Prostatic Specific Antigen: 61.42 ng/mL — ABNORMAL HIGH (ref 0.00–4.00)

## 2016-12-08 MED ORDER — DIPHENHYDRAMINE HCL 50 MG/ML IJ SOLN
25.0000 mg | Freq: Once | INTRAMUSCULAR | Status: AC
  Administered 2016-12-08: 25 mg via INTRAVENOUS
  Filled 2016-12-08: qty 1

## 2016-12-08 MED ORDER — FAMOTIDINE IN NACL 20-0.9 MG/50ML-% IV SOLN
20.0000 mg | Freq: Once | INTRAVENOUS | Status: AC
  Administered 2016-12-08: 20 mg via INTRAVENOUS

## 2016-12-08 MED ORDER — MORPHINE SULFATE ER 30 MG PO TBCR: 30.0000 mg | EXTENDED_RELEASE_TABLET | Freq: Two times a day (BID) | ORAL | 0 refills | Status: DC

## 2016-12-08 MED ORDER — SODIUM CHLORIDE 0.9% FLUSH
10.0000 mL | INTRAVENOUS | Status: DC | PRN
  Administered 2016-12-08: 10 mL
  Filled 2016-12-08: qty 10

## 2016-12-08 MED ORDER — PEGFILGRASTIM 6 MG/0.6ML ~~LOC~~ PSKT
6.0000 mg | PREFILLED_SYRINGE | Freq: Once | SUBCUTANEOUS | Status: AC
  Administered 2016-12-08: 6 mg via SUBCUTANEOUS
  Filled 2016-12-08: qty 0.6

## 2016-12-08 MED ORDER — SODIUM CHLORIDE 0.9 % IV SOLN
Freq: Once | INTRAVENOUS | Status: AC
  Administered 2016-12-08: 10:00:00 via INTRAVENOUS

## 2016-12-08 MED ORDER — DEXAMETHASONE SODIUM PHOSPHATE 10 MG/ML IJ SOLN
10.0000 mg | Freq: Once | INTRAMUSCULAR | Status: AC
  Administered 2016-12-08: 10 mg via INTRAVENOUS
  Filled 2016-12-08: qty 1

## 2016-12-08 MED ORDER — GABAPENTIN 300 MG PO CAPS: ORAL_CAPSULE | ORAL | 1 refills | Status: DC

## 2016-12-08 MED ORDER — HEPARIN SOD (PORK) LOCK FLUSH 100 UNIT/ML IV SOLN
500.0000 [IU] | Freq: Once | INTRAVENOUS | Status: AC | PRN
  Administered 2016-12-08: 500 [IU]

## 2016-12-08 MED ORDER — FAMOTIDINE IN NACL 20-0.9 MG/50ML-% IV SOLN
INTRAVENOUS | Status: AC
  Filled 2016-12-08: qty 50

## 2016-12-08 MED ORDER — DEXTROSE 5 % IV SOLN
20.0000 mg/m2 | Freq: Once | INTRAVENOUS | Status: AC
  Administered 2016-12-08: 48 mg via INTRAVENOUS
  Filled 2016-12-08: qty 4.8

## 2016-12-08 NOTE — Patient Instructions (Signed)
[facility] Discharge Instructions for Patients Receiving Chemotherapy   Beginning January 23rd 2017 lab work for the [facility] will be done in the  Main lab at [facility] on 1st floor. If you have a lab appointment with the [place] please come in thru the  Main Entrance and check in at the main information desk   Today you received the following chemotherapy agents Jevtana. Follow-up as scheduled. Call clinic for any questions or concerns  To help prevent nausea and vomiting after your treatment, we encourage you to take your nausea medication   If you develop nausea and vomiting, or diarrhea that is not controlled by your medication, call the clinic.  The clinic phone number is [phone]. Office hours are Monday-Friday 8:30am-5:00pm.  BELOW ARE SYMPTOMS THAT SHOULD BE REPORTED IMMEDIATELY:  *FEVER GREATER THAN 101.0 F  *CHILLS WITH OR WITHOUT FEVER  NAUSEA AND VOMITING THAT IS NOT CONTROLLED WITH YOUR NAUSEA MEDICATION  *UNUSUAL SHORTNESS OF BREATH  *UNUSUAL BRUISING OR BLEEDING  TENDERNESS IN MOUTH AND THROAT WITH OR WITHOUT PRESENCE OF ULCERS  *URINARY PROBLEMS  *BOWEL PROBLEMS  UNUSUAL RASH Items with * indicate a potential emergency and should be followed up as soon as possible. If you have an emergency after office hours please contact your primary care physician or go to the nearest emergency department.  Please call the clinic during office hours if you have any questions or concerns.   You may also contact the Patient Navigator at [phone] should you have any questions or need assistance in obtaining follow up care.      Resources For Cancer Patients and their Caregivers ? American Cancer Society: Can assist with transportation, wigs, general needs, runs Look Good Feel Better.        816-528-8074 ? Cancer Care: Provides financial assistance, online support groups, medication/co-pay assistance.  1-800-813-HOPE  (780)537-1874) ? [place] Assists [place] cancer patients and their families through emotional , educational and financial support.  (915) 872-2293 ? [place] DSS Where to apply for food stamps, Medicaid and utility assistance. 630-881-9977 ? [employer]: Transportation to medical appointments. 769-857-0578 ? Social Security Administration: May apply for disability if have a Stage IV cancer. 563-587-4729 351-038-0912 ? [employer], Disability and Transit Services: Assists with nutrition, care and transit needs. [phone]

## 2016-12-08 NOTE — Patient Instructions (Signed)
[facility] at [facility] Discharge Instructions  RECOMMENDATIONS MADE BY THE CONSULTANT AND ANY TEST RESULTS WILL BE SENT TO YOUR REFERRING PHYSICIAN.  You were seen today by Dr. Louise Zhou    Thank you for choosing  [facility] at [facility] to provide your oncology and hematology care.  To afford each patient quality time with our provider, please arrive at least 15 minutes before your scheduled appointment time.    If you have a lab appointment with the Cancer Center please come in thru the  Main Entrance and check in at the main information desk  You need to re-schedule your appointment should you arrive 10 or more minutes late.  We strive to give you quality time with our providers, and arriving late affects you and other patients whose appointments are after yours.  Also, if you no show three or more times for appointments you may be dismissed from the clinic at the providers discretion.     Again, thank you for choosing [facility].  Our hope is that these requests will decrease the amount of time that you wait before being seen by our physicians.       _____________________________________________________________  Should you have questions after your visit to [facility], please contact our office at [phone] between the hours of 8:30 a.m. and 4:30 p.m.  Voicemails left after 4:30 p.m. will not be returned until the following business day.  For prescription refill requests, have your pharmacy contact our office.       Resources For Cancer Patients and their Caregivers ? American Cancer Society: Can assist with transportation, wigs, general needs, runs Look Good Feel Better.        1-888-227-6333 ? Cancer Care: Provides financial assistance, online support groups, medication/co-pay assistance.  1-800-813-HOPE (4673) ? [facility] Assists [place] cancer patients and their  families through emotional , educational and financial support.  336-427-4357 ? [place] DSS Where to apply for food stamps, Medicaid and utility assistance. 336-342-1394 ? [employer]: Transportation to medical appointments. 336-347-2287 ? Social Security Administration: May apply for disability if have a Stage IV cancer. 336-342-7796 1-800-772-1213 ? [place] [employer], Disability and Transit Services: Assists with nutrition, care and transit needs. 336-349-2343  Cancer Center Support Programs: @10RELATIVEDAYS@ > Cancer Support Group  2nd Tuesday of the month 1pm-2pm, Journey Room  > Creative Journey  3rd Tuesday of the month 1130am-1pm, Journey Room  > Look Good Feel Better  1st Wednesday of the month 10am-12 noon, Journey Room (Call [employer] to register 1-800-395-5775)    

## 2016-12-08 NOTE — Progress Notes (Signed)
Micheal Labrum, MD [address] [place] [address]  Prostate cancer metastatic to bone [facility]) - Plan: morphine (MS CONTIN) 30 MG 12 hr tablet  CURRENT THERAPY: Jevtana beginning on 01/28/2016  INTERVAL HISTORY: Micheal Davis 71 y.o. 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    Prostate cancer metastatic to bone ([place])   10/01/2012 Initial Diagnosis    Prostate biopsied with highest Gleason score of 9 seen and the lowest score was 7.      10/04/2012 - 05/16/2013 Chemotherapy    Depo-Lupron and Casodex initiated      05/16/2013 -  Chemotherapy    Depo-Lupron monthly continued      05/16/2013 Progression    Progression by PSA elevation      05/16/2013 - 10/22/2014 Chemotherapy    Abiraterone and prednisone initiated in conjunction with ongoing Depo-Lupron.  Denosumab also ongoing.      10/23/2014 Progression    PSA increasing from 0.2- 1.6 in less than 6 months.        10/23/2014 - 01/30/2015 Chemotherapy    Enzalutamide and Prednisone (5 mg in AM and 2.5 mg in PM)      01/30/2015 Imaging    Bone scan- New focus of intense activity in right proximal humerus.  Interim increase in activity over left hip.      01/30/2015 Progression    Bone scan reveals new disease in right humerus consistent with progression of disease      01/31/2015 Imaging    Right humerus xray- blastic foci in proximal right humeral metaphysis and over right mid-humeral diaphysis.  No evidence of fracture      07/06/2015 Progression    Progression in multiple bones especially L hip and femurs      07/06/2015 Imaging    Bone scan- progressive multifocal osseous metastases in the right proximal femora and distal femoral shafts.  Stable update in bilateral ribs suspicious for small rib metastases        07/16/2015 - 07/31/2015 Radiation Therapy    Left femur 30 Gy in 10 fractions by Dr. Tammi Klippel      11/23/2015 - 01/04/2016 Chemotherapy    The patient had pegfilgrastim (NEULASTA ONPRO KIT) injection 6 mg, 6 mg, Subcutaneous, Once, 3 of 7 cycles  DOCEtaxel (TAXOTERE) 180 mg in dextrose 5 % 250 mL chemo infusion, 75 mg/m2 = 180 mg, Intravenous,  Once, 3 of 7 cycles Dose modification: 64 mg/m2 (original dose 75 mg/m2, Cycle 2, Reason: Dose not tolerated)  pegfilgrastim (NEULASTA ONPRO KIT) injection 6 mg, 6 mg, Subcutaneous, Once, 0 of 4 cycles  cabazitaxel (JEVTANA) 60 mg in dextrose 5 % 250 mL chemo infusion, 25 mg/m2, Intravenous,  Once, 0 of 4 cycles  for chemotherapy treatment.        11/30/2015 Adverse Reaction    Diarrhea (secondary to chemotherapy) and dehydration requiring IV fluids      12/14/2015 Treatment Plan Change    Docetaxel dose reduced by 15%      12/31/2015 Procedure    Port placed by Dr. Arnoldo Morale      01/28/2016 -  Chemotherapy    Cabazitaxel (Jevtana)       03/27/2016 Imaging    CT Chest, Abdomen, and Pelvis with contrast 1. Diffuse sclerotic osseous metastatic disease in the chest,  abdomen, and pelvis without acute fracture identified. 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 These appear chronic. 2. The prostate gland is normal in size and no adenopathy is currently identified. 3. On a prior MRI of 10/04/2012, there was a posterior right hepatic lobe lesion. This lesion is not currently visible on today' s CT. This could be due to differences in cons acuity between CT or MRI, or resolution of the lesion. 4. Coronary, aortic arch, and branch vessel atherosclerotic vascular disease. Aortoiliac atherosclerotic vascular disease. 5. Single bilateral renal cysts.      04/16/2016 Imaging    Bone scan- Findings consist with progressive metastatic disease. Activity over the proximal right humerus and proximal bilateral  femurs are worrisome for the possible development of pathologic fractures.      08/21/2016 Imaging    CT C/A/P: IMPRESSION: No significant change since 03/27/2016 CT. Diffuse bony metastases without other evidence of metastatic disease.      08/21/2016 Imaging    Bone Scan: IMPRESSION: Multiple areas of increased activity again noted throughout the axial and appendicular skeleton in similar locations as prior exam. Intensity of uptake is increased from prior exam suggesting progressive disease. Lesions present in the proximal humeri, proximal femurs, and the mid right femur susceptible to pathologic fracture.      12/02/2016 Imaging    CT C/A/P: IMPRESSION: 1. Overall stable appearance of diffuse osseous metastatic disease and resulting patchy sclerosis. 2. The patient had a posterior right hepatic lobe lesion on prior MRI from 2014 which is been relatively occult on CT. Given the lack of progression I suspect that this is benign or has been effectively treated. 3. Aortic Atherosclerosis (ICD10-I70.0). Coronary atherosclerosis with mild cardiomegaly. 4. Cholelithiasis. 5. Bilateral benign renal cysts. 6. 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      12/02/2016 Imaging    Bone Scan: IMPRESSION: 1. Widespread osseous metastatic disease. Multiplicity is similar to previous exam with interval increase in degree of tracer uptake associated with multiple lesions.       Patient presents today with his wife for continued follow-up as well as his Jevtana treatment and review of scan results. He states that he has been doing well except that he feels like he is having increasing fatigue and has slowed down a lot at work. He continues to have his chronic bone pains but it has been worse in his legs and back. He  also states that he has numbness in his feet which is more constant. He continues to have hot flashes. Bowel movements are normal except for occasional constipation. He denies any chest pain, shortness breath, abdominal pain. Appetite is stable.  Review of Systems  Constitutional: Negative.  Negative for chills, fever and weight loss.  HENT: Negative.   Eyes: Negative.   Respiratory: Negative.  Negative for cough.   Cardiovascular: Negative.  Negative for chest pain.  Gastrointestinal: Negative.  Negative for blood in stool, constipation, diarrhea, melena, nausea and vomiting.  Genitourinary: Negative.   Musculoskeletal: Negative.   Skin: Negative.   Neurological: Negative.  Negative for weakness.  Endo/Heme/Allergies: Negative.   Psychiatric/Behavioral: Negative.     Past Medical History:  Diagnosis Date  . Diabetes mellitus without complication ([place])   . Hypertension   . Prostate cancer ([place]) 09/05/2015  . Prostate cancer metastatic to bone ([place]) 09/05/2015  .  Sleep apnea   . Thyroid disease     Past Surgical History:  Procedure Laterality Date  . CATARACT EXTRACTION    . PORTACATH PLACEMENT Left 12/31/2015   Procedure: INSERTION PORT-A-CATH LEFT SUBCLAVIAN;  Surgeon: Aviva Signs, MD;  Location: [facility];  Service: General;  Laterality: Left;  . REPLACEMENT TOTAL KNEE Left     History reviewed. No pertinent family history.  Social History   Social History  . Marital status: Married    Spouse name: N/A  . Number of children: N/A  . Years of education: N/A   Social History Main Topics  . Smoking status: Never Smoker  . Smokeless tobacco: Never Used  . Alcohol use Yes     Comment: 1 beer each month  . Drug use: No  . Sexual activity: No     Comment: married   Other Topics Concern  . None   Social History Narrative  . None     PHYSICAL EXAMINATION  ECOG PERFORMANCE STATUS: 1 - Symptomatic but completely ambulatory  RN vitals reviewed.  GENERAL:alert, no  distress, well nourished, well developed, comfortable, cooperative, obese, smiling and in chemo-recliner, accompanied by wife. SKIN: skin color, texture, turgor are normal, no rashes or significant lesions HEAD: Normocephalic, No masses, lesions, tenderness or abnormalities EYES: normal, EOMI, Conjunctiva are pink and non-injected EARS: External ears normal OROPHARYNX:lips, buccal mucosa, and tongue normal and mucous membranes are moist  NECK: supple, no adenopathy, trachea midline LYMPH:  no palpable lymphadenopathy BREAST:not examined LUNGS: clear to auscultation  HEART: regular rate & rhythm, no murmurs and no gallops ABDOMEN:abdomen soft, non-tender, obese and normal bowel sounds BACK: Back symmetric, no curvature. EXTREMITIES:less then 2 second capillary refill, no joint deformities, effusion, or inflammation, no edema, no skin discoloration, no clubbing, no cyanosis  NEURO: alert & oriented x 3 with fluent speech, no focal motor/sensory deficits, gait normal   LABORATORY DATA: CBC    Component Value Date/Time   WBC 13.1 (H) 12/02/2016 0802   RBC 3.98 (L) 12/02/2016 0802   HGB 12.7 (L) 12/02/2016 0802   HCT 38.2 (L) 12/02/2016 0802   PLT 184 12/02/2016 0802   MCV 96.0 12/02/2016 0802   MCH 31.9 12/02/2016 0802   MCHC 33.2 12/02/2016 0802   RDW 14.8 12/02/2016 0802   LYMPHSABS 1.5 12/02/2016 0802   MONOABS 0.6 12/02/2016 0802   EOSABS 0.1 12/02/2016 0802   BASOSABS 0.1 12/02/2016 0802      Chemistry      Component Value Date/Time   NA 137 12/02/2016 0802   K 4.0 12/02/2016 0802   CL 101 12/02/2016 0802   CO2 27 12/02/2016 0802   BUN 16 12/02/2016 0802   CREATININE 0.81 12/02/2016 0802      Component Value Date/Time   CALCIUM 9.6 12/02/2016 0802   ALKPHOS 114 12/02/2016 0802   AST 22 12/02/2016 0802   ALT 19 12/02/2016 0802   BILITOT 0.6 12/02/2016 0802     Lab Results  Component Value Date   PSA 66.58 (H) 07/14/2016   PSA 62.25 (H) 06/23/2016   PSA 68.32  (H) 04/14/2016     PENDING LABS:   RADIOGRAPHIC STUDIES:  Ct Chest W Contrast  Result Date: 12/02/2016 CLINICAL DATA:  Prostate cancer with metastatic disease to bone and ongoing chemotherapy. Diabetes. EXAM: CT CHEST, ABDOMEN, AND PELVIS WITH CONTRAST TECHNIQUE: Multidetector CT imaging of the chest, abdomen and pelvis was performed following the standard protocol during bolus administration of intravenous contrast. CONTRAST:  150m ISOVUE-300  IOPAMIDOL (ISOVUE-300) INJECTION 61% COMPARISON:  Multiple exams, including 08/21/2016 FINDINGS: CT CHEST FINDINGS Cardiovascular: Left Port-A-Cath tip:  Upper SVC. Coronary and aortic arch atherosclerosis.  Mild cardiomegaly. Mediastinum/Nodes: No pathologic adenopathy in the chest identified. Lungs/Pleura: Mild scarring anterolaterally in the right lower lobe. Musculoskeletal: Widespread osseous metastatic disease not appreciably changed from prior. Right rib deformities from prior fractures. CT ABDOMEN PELVIS FINDINGS Hepatobiliary: 2.2 cm gallstone in the gallbladder. The patient did have a right hepatic lobe posterior lesion on 10/04/2012 but this lesion is not well appreciated on CT aside from perhaps a tiny and subtle hypodensity on image 58/2. Pancreas: Unremarkable Spleen: Unremarkable Adrenals/Urinary Tract: 1.6 cm right mid upper kidney cyst, stable. 1.4 by 1.2 by 1.1 cm left mid kidney hypodense lesion is somewhat less sharply defined and appears to of likely been present since 2007 and accordingly is probably a benign cyst. This was present and did not enhance on 10/04/2012. Stomach/Bowel: Unremarkable Vascular/Lymphatic: Aortoiliac atherosclerotic vascular disease. No pathologic adenopathy. Reproductive: Unremarkable Other: No supplemental non-categorized findings. Musculoskeletal: Widespread sclerosis indicating diffuse osseous metastatic disease. Chronic bilateral pars defects at L5 with 9 mm of anterolisthesis and resulting bilateral foraminal  stenosis. Lumbar spondylosis and degenerative disc disease along with congenitally short pedicles. IMPRESSION: 1. Overall stable appearance of diffuse osseous metastatic disease and resulting patchy sclerosis. 2. The patient had a posterior right hepatic lobe lesion on prior MRI from 2014 which is been relatively occult on CT. Given the lack of progression I suspect that this is benign or has been effectively treated. 3. Aortic Atherosclerosis (ICD10-I70.0). Coronary atherosclerosis with mild cardiomegaly. 4. Cholelithiasis. 5. Bilateral benign renal cysts. 6. 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 Electronically Signed   By: Van Clines M.D.   On: 12/02/2016 10:54   Nm Bone Scan Whole Body  Result Date: 12/03/2016 CLINICAL DATA:  Evaluate bone metastases. History of prostate cancer. EXAM: NUCLEAR MEDICINE WHOLE BODY BONE SCAN TECHNIQUE: Whole body anterior and posterior images were obtained approximately 3 hours after intravenous injection of radiopharmaceutical. RADIOPHARMACEUTICALS:  22.0 MCi Technetium-62mMDP IV COMPARISON:  08/18/2016 FINDINGS: Multifocal areas of abnormal increased uptake involving the axial and appendicular skeleton are again noted compatible with widespread osseous metastatic disease. The number and location of abnormal uptake is unchanged from previous exam. The degree of uptake hour appears increased in the interval. Physiologic uptake is seen within both kidneys and the urinary bladder. IMPRESSION: 1. Widespread osseous metastatic disease. Multiplicity is similar to previous exam with interval increase in degree of tracer uptake associated with multiple lesions. Electronically Signed   By: TKerby MoorsM.D.   On: 12/03/2016 15:29   Ct Abdomen Pelvis W Contrast  Result Date: 12/02/2016 CLINICAL  DATA:  Prostate cancer with metastatic disease to bone and ongoing chemotherapy. Diabetes. EXAM: CT CHEST, ABDOMEN, AND PELVIS WITH CONTRAST TECHNIQUE: Multidetector CT imaging of the chest, abdomen and pelvis was performed following the standard protocol during bolus administration of intravenous contrast. CONTRAST:  1039mISOVUE-300 IOPAMIDOL (ISOVUE-300) INJECTION 61% COMPARISON:  Multiple exams, including 08/21/2016 FINDINGS: CT CHEST FINDINGS Cardiovascular: Left Port-A-Cath tip:  Upper SVC. Coronary and aortic arch atherosclerosis.  Mild cardiomegaly. Mediastinum/Nodes: No pathologic adenopathy in the chest identified. Lungs/Pleura: Mild scarring anterolaterally in the right lower lobe. Musculoskeletal: Widespread osseous metastatic disease not appreciably changed from prior. Right rib deformities from prior fractures. CT ABDOMEN  PELVIS FINDINGS Hepatobiliary: 2.2 cm gallstone in the gallbladder. The patient did have a right hepatic lobe posterior lesion on 10/04/2012 but this lesion is not well appreciated on CT aside from perhaps a tiny and subtle hypodensity on image 58/2. Pancreas: Unremarkable Spleen: Unremarkable Adrenals/Urinary Tract: 1.6 cm right mid upper kidney cyst, stable. 1.4 by 1.2 by 1.1 cm left mid kidney hypodense lesion is somewhat less sharply defined and appears to of likely been present since 2007 and accordingly is probably a benign cyst. This was present and did not enhance on 10/04/2012. Stomach/Bowel: Unremarkable Vascular/Lymphatic: Aortoiliac atherosclerotic vascular disease. No pathologic adenopathy. Reproductive: Unremarkable Other: No supplemental non-categorized findings. Musculoskeletal: Widespread sclerosis indicating diffuse osseous metastatic disease. Chronic bilateral pars defects at L5 with 9 mm of anterolisthesis and resulting bilateral foraminal stenosis. Lumbar spondylosis and degenerative disc disease along with congenitally short pedicles. IMPRESSION: 1. Overall  stable appearance of diffuse osseous metastatic disease and resulting patchy sclerosis. 2. The patient had a posterior right hepatic lobe lesion on prior MRI from 2014 which is been relatively occult on CT. Given the lack of progression I suspect that this is benign or has been effectively treated. 3. Aortic Atherosclerosis (ICD10-I70.0). Coronary atherosclerosis with mild cardiomegaly. 4. Cholelithiasis. 5. Bilateral benign renal cysts. 6. 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 Electronically Signed   By: Van Clines M.D.   On: 12/02/2016 10:54     PATHOLOGY:    ASSESSMENT AND PLAN:  Castration resistant metastatic prostate cancer with disease to bone, initially diagnosed in August 2014 with elevated PSA at 123 and proatate biopsy demonstrating a Gleason score of 4+5=9 with high-volume disease.  Currently on Jevtana beginning on 01/28/2016. Osseous disease has progressed on June 2018 restaging scans, but with limited options, stable PSA, and good performance status without osseous symptoms, will continue with Jevtana.  -Continued Jevtana. CT C/A/P and bone scan reviewed with the patient and his wife. Scans are stable except for increased radiotracer uptake on the bone scan, however the number of lesions is stable. If the bone pain becomes more diffuse and worsens then we can consider Xofigo. -Last from today reviewed. -Gabapentin prescribed today for his neuropathy in his feet. -Refilled MS contin today. -Return to clinic in 3 weeks for follow-up as well as his next cycle of Jevtana.  THERAPY PLAN:  Continue with palliative treatment as described above.  All questions were answered. The patient knows to call the clinic with any problems, questions or concerns. We can certainly see the patient much sooner if  necessary.   This note is electronically signed by: Twana First, MD 12/08/2016 9:50 AM

## 2016-12-08 NOTE — Progress Notes (Signed)
1010 Labs from 10/2 reviewed with Dr. Talbert Cage and pt approved for chemo tx today using those lab results         Micheal Davis tolerated chemo tx with Neulasta on-pro well without complaints or incident. Labs from today reviewed as well VSS upon discharge.Pt discharged with Neulasta on-pro applied to pt's right arm with green indicator light flashing. Pt discharged self ambulatory in satisfactory condition accompanied by his wife

## 2016-12-09 DIAGNOSIS — Z23 Encounter for immunization: Secondary | ICD-10-CM | POA: Diagnosis not present

## 2016-12-29 NOTE — Progress Notes (Signed)
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      10/04/2012 - 05/16/2013 Chemotherapy    Depo-Lupron and Casodex initiated      05/16/2013 -  Chemotherapy    Depo-Lupron monthly continued      05/16/2013 Progression    Progression by PSA elevation      05/16/2013 - 10/22/2014 Chemotherapy    Abiraterone and prednisone initiated in conjunction with ongoing Depo-Lupron.  Denosumab also ongoing.      10/23/2014 Progression    PSA increasing from 0.2- 1.6 in less than 6 months.        10/23/2014 - 01/30/2015 Chemotherapy    Enzalutamide and Prednisone (5 mg in AM and 2.5 mg in PM)      01/30/2015 Imaging    Bone scan- New focus of intense activity in right proximal humerus.  Interim increase in activity over left hip.      01/30/2015 Progression    Bone scan reveals new disease in right humerus consistent with progression of disease      01/31/2015 Imaging    Right humerus xray- blastic foci in proximal right humeral metaphysis and over right mid-humeral diaphysis.  No evidence of fracture      07/06/2015 Progression    Progression in multiple bones especially L hip and femurs      07/06/2015 Imaging    Bone scan- progressive multifocal osseous metastases in the right proximal femora and distal femoral shafts.  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 Tammi Klippel      11/23/2015 - 01/04/2016 Chemotherapy    The patient had pegfilgrastim (NEULASTA ONPRO KIT) injection 6 mg, 6 mg, Subcutaneous, Once, 3 of 7 cycles  DOCEtaxel (TAXOTERE) 180 mg in dextrose 5 % 250 mL chemo infusion, 75 mg/m2 = 180 mg, Intravenous,  Once, 3 of 7 cycles Dose modification: 64 mg/m2 (original dose 75 mg/m2, Cycle 2, Reason: Dose not tolerated)  pegfilgrastim (NEULASTA ONPRO KIT) injection 6 mg, 6 mg, Subcutaneous, Once, 0 of 4 cycles  cabazitaxel (JEVTANA) 60 mg in dextrose 5 % 250 mL chemo infusion, 25 mg/m2, Intravenous,  Once, 0 of 4 cycles  for chemotherapy treatment.        11/30/2015 Adverse Reaction    Diarrhea (secondary to chemotherapy) and dehydration requiring IV fluids      12/14/2015 Treatment Plan Change    Docetaxel dose reduced by 15%      12/31/2015 Procedure    Port placed by Dr. Arnoldo Morale      01/28/2016 -  Chemotherapy    Cabazitaxel (Jevtana)       03/27/2016 Imaging    CT Chest, Abdomen, and Pelvis with contrast 1. Diffuse sclerotic osseous metastatic disease in the chest, abdomen, and pelvis without acute fracture identified. 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 These  appear chronic. 2. The prostate gland is normal in size and no adenopathy is currently identified. 3. On a prior MRI of 10/04/2012, there was a posterior right hepatic lobe lesion. This lesion is not currently visible on today' s CT. This could be due to differences in cons acuity between CT or MRI, or resolution of the lesion. 4. Coronary, aortic arch, and branch vessel atherosclerotic vascular disease. Aortoiliac atherosclerotic vascular disease. 5. Single bilateral renal cysts.      04/16/2016 Imaging    Bone scan- Findings consist with progressive metastatic disease. Activity over the proximal right humerus and proximal bilateral femurs are worrisome for the possible development of pathologic fractures.      08/21/2016 Imaging    CT  C/A/P: IMPRESSION: No significant change since 03/27/2016 CT. Diffuse bony metastases without other evidence of metastatic disease.      08/21/2016 Imaging    Bone Scan: IMPRESSION: Multiple areas of increased activity again noted throughout the axial and appendicular skeleton in similar locations as prior exam. Intensity of uptake is increased from prior exam suggesting progressive disease. Lesions present in the proximal humeri, proximal femurs, and the mid right femur susceptible to pathologic fracture.      12/02/2016 Imaging    CT C/A/P: IMPRESSION: 1. Overall stable appearance of diffuse osseous metastatic disease and resulting patchy sclerosis. 2. The patient had a posterior right hepatic lobe lesion on prior MRI from 2014 which is been relatively occult on CT. Given the lack of progression I suspect that this is benign or has been effectively treated. 3. Aortic Atherosclerosis (ICD10-I70.0). Coronary atherosclerosis with mild cardiomegaly. 4. Cholelithiasis. 5. Bilateral benign renal cysts. 6. 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      12/02/2016 Imaging    Bone Scan: IMPRESSION: 1. Widespread osseous metastatic disease. Multiplicity is similar to previous exam with interval increase in degree of tracer uptake associated with multiple lesions.          INTERVAL HISTORY:  Micheal Davis 71 y.o. male returns for routine follow-up and consideration for next cycle of chemotherapy.   Due next cycle of Jevtana today.  Here today with his wife.  Overall, he tells me she has been feeling pretty well.  Appetite 75%; energy levels 50%.  He feels fatigued and weak at times; his wife reports that he is not very active "except sitting on the couch and using the remote."  He has chronic dyspnea on  exertion and leg swelling, which are no worse.  He has occasional nausea with eating; his anti-emetics control his nausea well.  Chart reviewed; his weight is actually up ~6 lbs in the past 3 weeks.    Continues to have arthralgias, particularly to his knees and legs.  Remains on MS Contin BID with Percocet for breakthrough pain. States that he generally only requires ~2 doses of Percocet per day.  Continues to have some peripheral neuropathy to his legs/feet and fingertips; he is still able to button his buttons on his clothes.  He started gabapentin and is taking BID; he thinks his symptoms have improved some.  "I do stagger a little when I walk now, but I haven't fallen down."   His bowels are moving well; he has intermittent diarrhea episodes, which he controls with Imodium.    Overall, he feels ready for next cycle of treatment today.  REVIEW OF SYSTEMS:  Review of Systems  Constitutional: Positive for fatigue. Negative for chills and fever.  HENT:  Negative.   Eyes: Negative.   Respiratory: Positive for shortness of breath.   Cardiovascular: Positive for leg swelling.  Gastrointestinal: Positive for diarrhea and nausea. Negative for abdominal pain, blood in stool and vomiting.  Endocrine: Negative.   Genitourinary: Negative.  Negative for dysuria and hematuria.   Musculoskeletal: Positive for arthralgias.  Skin: Negative.  Negative for rash.  Neurological: Positive for extremity weakness and numbness. Negative for dizziness and headaches.  Hematological: Bruises/bleeds easily (bruises easily ).  Psychiatric/Behavioral: Negative.      PAST MEDICAL/SURGICAL HISTORY:  Past Medical History:  Diagnosis Date  . Diabetes mellitus without complication ([place])   . Hypertension   . Prostate cancer ([place]) 09/05/2015  . Prostate cancer metastatic to bone ([place]) 09/05/2015  . Sleep apnea   . Thyroid disease    Past Surgical History:  Procedure Laterality Date  . CATARACT EXTRACTION    .  PORTACATH PLACEMENT Left 12/31/2015   Procedure: INSERTION PORT-A-CATH LEFT SUBCLAVIAN;  Surgeon: Aviva Signs, MD;  Location: [facility];  Service: General;  Laterality: Left;  . REPLACEMENT TOTAL KNEE Left      SOCIAL HISTORY:  Social History   Social History  . Marital status: Married    Spouse name: N/A  . Number of children: N/A  . Years of education: N/A   Occupational History  . Not on file.   Social History Main Topics  . Smoking status: Never Smoker  . Smokeless tobacco: Never Used  . Alcohol use Yes     Comment: 1 beer each month  . Drug use: No  . Sexual activity: No     Comment: married   Other Topics Concern  . Not on file   Social History Narrative  . No narrative on file    FAMILY HISTORY:  History reviewed. No pertinent family history.  CURRENT MEDICATIONS:  Outpatient Encounter Prescriptions as of 12/30/2016  Medication Sig  . atenolol (TENORMIN) 100 MG tablet Take 100 mg by mouth daily.  Marland Kitchen atorvastatin (LIPITOR) 20 MG tablet Take 10 mg by mouth daily.  . Cabazitaxel (JEVTANA IV) Inject into the vein. Every 3 weeks  . calcium carbonate (OS-CAL - DOSED IN MG OF ELEMENTAL CALCIUM) 1250 (500 Ca) MG tablet Take 2 tablets by mouth daily with breakfast.   . diltiazem (CARDIZEM CD) 300 MG 24 hr capsule Take 300 mg by mouth daily.  Marland Kitchen esomeprazole (NEXIUM) 20 MG capsule Take 1 capsule (20 mg total) by mouth daily at 12 noon.  . gabapentin (NEURONTIN) 300 MG capsule Take 1 tab qHS for the first week, if tolerating can increase to 1 tab PO BID by week 2  . Glucosamine-Chondroit-Vit C-Mn (GLUCOSAMINE 1500 COMPLEX PO) Take 1 tablet by mouth 2 (two) times daily.   Marland Kitchen KLOR-CON M20 20 MEQ tablet TAKE 1 TABLET BY MOUTH ONCE DAILY  . levothyroxine (SYNTHROID, LEVOTHROID) 25 MCG tablet Take 25 mcg by mouth daily before breakfast.  . lidocaine-prilocaine (EMLA) cream Apply to affected area once  . loperamide (IMODIUM) 1 MG/5ML solution Take 1 mg by mouth as needed for  diarrhea or loose stools.  . magic mouthwash w/lidocaine SOLN 1 part of each of the following: Benadryl 12.65m /569m Viscous lidocaine 2%, Maalox. Swish and swallow 5 mL QID.  . Marland KitchenetFORMIN (GLUCOPHAGE) 500 MG tablet Take 500 mg by mouth 2 (two) times daily with a meal.  . morphine (MS  CONTIN) 30 MG 12 hr tablet Take 1 tablet (30 mg total) by mouth every 12 (twelve) hours.  . Multiple Vitamin (MULTIVITAMIN WITH MINERALS) TABS tablet Take 1 tablet by mouth daily.  . Omega-3 Fatty Acids (FISH OIL) 1000 MG CAPS Take 1 capsule by mouth daily.  . ondansetron (ZOFRAN) 8 MG tablet Take 1 tablet (8 mg total) by mouth 2 (two) times daily as needed (Nausea or vomiting).  Marland Kitchen oxyCODONE-acetaminophen (PERCOCET) 10-325 MG tablet Take 1 tablet by mouth every 6 (six) hours as needed for pain.  Marland Kitchen Pegfilgrastim (NEULASTA ONPRO [place]) Inject into the skin. Every 21 days  . predniSONE (DELTASONE) 5 MG tablet TAKE 1 TABLET BY MOUTH TWICE DAILY WITH A MEAL  . prochlorperazine (COMPAZINE) 10 MG tablet Take 1 tablet (10 mg total) by mouth every 6 (six) hours as needed (Nausea or vomiting).  . tamsulosin (FLOMAX) 0.4 MG CAPS capsule Take 0.8 mg by mouth daily.   Marland Kitchen triamterene-hydrochlorothiazide (MAXZIDE-25) 37.5-25 MG tablet Take 1 tablet by mouth daily. for high blood pressure  . vitamin B-12 (CYANOCOBALAMIN) 500 MCG tablet Take by mouth.  . vitamin E 400 UNIT capsule Take 400 Units by mouth 2 (two) times daily.   . [DISCONTINUED] morphine (MS CONTIN) 30 MG 12 hr tablet Take 1 tablet (30 mg total) by mouth every 12 (twelve) hours.   Facility-Administered Encounter Medications as of 12/30/2016  Medication  . leuprolide (LUPRON) injection 7.5 mg    ALLERGIES:  No Known Allergies   PHYSICAL EXAM:  ECOG Performance status: 1 - Symptomatic; remains largely independent  Vitals:   12/30/16 0931  BP: (!) 154/81  Pulse: 60  Resp: 18  Temp: (!) 97.5 F (36.4 C)  SpO2: 98%       Physical Exam  Constitutional: He  is oriented to person, place, and time and well-developed, well-nourished, and in no distress.  Seen in chemo chair in infusion area   HENT:  Head: Normocephalic.  Mouth/Throat: Oropharynx is clear and moist.  Small fissure to (R) lower lip; patient states he recently had "fever blister."   Eyes: Conjunctivae are normal. No scleral icterus.  Neck: Normal range of motion. Neck supple.  Cardiovascular: Normal rate and regular rhythm.   Pulmonary/Chest: Effort normal and breath sounds normal. No respiratory distress. He has no wheezes.  Abdominal: Soft. Bowel sounds are normal. There is no tenderness. There is no rebound.  Musculoskeletal: Normal range of motion. He exhibits edema (Trace BLE/ankle edema ).  Lymphadenopathy:    He has no cervical adenopathy.       Right: No supraclavicular adenopathy present.       Left: No supraclavicular adenopathy present.  Neurological: He is alert and oriented to person, place, and time. No cranial nerve deficit.  Skin: Skin is warm and dry. No rash noted.  Psychiatric: Mood, memory, affect and judgment normal.  Nursing note and vitals reviewed.    LABORATORY DATA:  I have reviewed the labs as listed.  CBC    Component Value Date/Time   WBC 8.9 12/30/2016 1000   RBC 3.72 (L) 12/30/2016 1000   HGB 11.9 (L) 12/30/2016 1000   HCT 34.7 (L) 12/30/2016 1000   PLT 182 12/30/2016 1000   MCV 93.3 12/30/2016 1000   MCH 32.0 12/30/2016 1000   MCHC 34.3 12/30/2016 1000   RDW 14.9 12/30/2016 1000   LYMPHSABS 1.0 12/30/2016 1000   MONOABS 0.7 12/30/2016 1000   EOSABS 0.1 12/30/2016 1000   BASOSABS 0.0 12/30/2016 1000  CMP Latest Ref Rng & Units 12/08/2016 12/02/2016 11/17/2016  Glucose 65 - 99 mg/dL 125(H) 123(H) 150(H)  BUN 6 - 20 mg/dL '18 16 15  ' Creatinine 0.61 - 1.24 mg/dL 0.85 0.81 0.77  Sodium 135 - 145 mmol/L 137 137 136  Potassium 3.5 - 5.1 mmol/L 3.9 4.0 3.7  Chloride 101 - 111 mmol/L 101 101 99(L)  CO2 22 - 32 mmol/L '24 27 24  ' Calcium 8.9  - 10.3 mg/dL 9.7 9.6 9.3  Total Protein 6.5 - 8.1 g/dL 6.2(L) 6.7 6.5  Total Bilirubin 0.3 - 1.2 mg/dL 0.8 0.6 0.8  Alkaline Phos 38 - 126 U/L 92 114 101  AST 15 - 41 U/L '23 22 23  ' ALT 17 - 63 U/L '22 19 21   ' Results for HAYGEN, ZEBROWSKI (MRN [id number])   Ref. Range 02/18/2016 09:21 03/10/2016 08:44 04/14/2016 10:33 06/23/2016 10:38 07/14/2016 09:08  PSA Latest Ref Range: 0.00 - 4.00 ng/mL 88.36 (H) 96.28 (H) 68.32 (H) 62.25 (H) 66.58 (H)    PENDING LABS:    DIAGNOSTIC IMAGING:  *The following radiologic images and reports have been reviewed independently and agree with below findings.  CT chest/abd/pelvis: 12/02/16 CLINICAL DATA:  Prostate cancer with metastatic disease to bone and ongoing chemotherapy. Diabetes.  EXAM: CT CHEST, ABDOMEN, AND PELVIS WITH CONTRAST  TECHNIQUE: Multidetector CT imaging of the chest, abdomen and pelvis was performed following the standard protocol during bolus administration of intravenous contrast.  CONTRAST:  121m ISOVUE-300 IOPAMIDOL (ISOVUE-300) INJECTION 61%  COMPARISON:  Multiple exams, including 08/21/2016  FINDINGS: CT CHEST FINDINGS  Cardiovascular: Left Port-A-Cath tip:  Upper SVC.  Coronary and aortic arch atherosclerosis.  Mild cardiomegaly.  Mediastinum/Nodes: No pathologic adenopathy in the chest identified.  Lungs/Pleura: Mild scarring anterolaterally in the right lower lobe.  Musculoskeletal: Widespread osseous metastatic disease not appreciably changed from prior. Right rib deformities from prior fractures.  CT ABDOMEN PELVIS FINDINGS  Hepatobiliary: 2.2 cm gallstone in the gallbladder. The patient did have a right hepatic lobe posterior lesion on 10/04/2012 but this lesion is not well appreciated on CT aside from perhaps a tiny and subtle hypodensity on image 58/2.  Pancreas: Unremarkable  Spleen: Unremarkable  Adrenals/Urinary Tract: 1.6 cm right mid upper kidney cyst, stable. 1.4 by 1.2 by 1.1 cm  left mid kidney hypodense lesion is somewhat less sharply defined and appears to of likely been present since 2007 and accordingly is probably a benign cyst. This was present and did not enhance on 10/04/2012.  Stomach/Bowel: Unremarkable  Vascular/Lymphatic: Aortoiliac atherosclerotic vascular disease. No pathologic adenopathy.  Reproductive: Unremarkable  Other: No supplemental non-categorized findings.  Musculoskeletal: Widespread sclerosis indicating diffuse osseous metastatic disease. Chronic bilateral pars defects at L5 with 9 mm of anterolisthesis and resulting bilateral foraminal stenosis. Lumbar spondylosis and degenerative disc disease along with congenitally short pedicles.  IMPRESSION: 1. Overall stable appearance of diffuse osseous metastatic disease and resulting patchy sclerosis. 2. The patient had a posterior right hepatic lobe lesion on prior MRI from 2014 which is been relatively occult on CT. Given the lack of progression I suspect that this is benign or has been effectively treated. 3. Aortic Atherosclerosis (ICD10-I70.0). Coronary atherosclerosis with mild cardiomegaly. 4. Cholelithiasis. 5. Bilateral benign renal cysts. 6. 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   Electronically Signed   By: WVan ClinesM.D.   On: 12/02/2016 10:54  Bone scan: 12/03/16 CLINICAL DATA:  Evaluate bone metastases. History of prostate cancer.  EXAM: NUCLEAR MEDICINE WHOLE BODY BONE SCAN  TECHNIQUE: Whole body anterior and posterior images were obtained approximately 3 hours after intravenous injection of radiopharmaceutical.  RADIOPHARMACEUTICALS:  22.0 MCi Technetium-67mMDP IV  COMPARISON:  08/18/2016  FINDINGS: Multifocal areas of abnormal increased uptake  involving the axial and appendicular skeleton are again noted compatible with widespread osseous metastatic disease. The number and location of abnormal uptake is unchanged from previous exam. The degree of uptake hour appears increased in the interval. Physiologic uptake is seen within both kidneys and the urinary bladder.  IMPRESSION: 1. Widespread osseous metastatic disease. Multiplicity is similar to previous exam with interval increase in degree of tracer uptake associated with multiple lesions.   Electronically Signed   By: TKerby MoorsM.D.   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 Initial PSA elevated at 123.  -Now on Jevtana with Neulasta OnPro support; tolerating relatively well.  -Most recent CT chest/abd/pelvis on 12/02/16 revealed overall stable disease with diffuse bony mets, but without any other evidence of visceral metastatic disease. Bone scan on 12/03/16 also revealed stable disease.  -PSA trends remain elevated in the ~60 range.  Last PSA 61.42 on 12/08/16.  -Discussed with Dr. ZTalbert Cage  Given stability of disease, will continue with Jevtana for now.  Reviewed results with patient/family today.  If his PSA rises over time and/or if his scans reveal gross progression of disease, then we will discuss changing treatment plan at that time.  He understands that his disease is not curable, but remains treatable.  He will be due for restaging imaging in ~4 months (04/2017); will place orders at subsequent follow-up visits.  -Labs reviewed and adequate for chemo today.  -Continue Jevtana every 3 weeks.  -Return to cancer center in ~6 weeks for follow-up with subsequent treatment.   Bone mets:  -Continue Xgeva injections monthly to help prevent  skeletal-related events.  -Encouraged continued calcium supplementation.  Fatigue:  -Likely multifactorial given metastatic cancer and active chemotherapy. However, I did encourage him to work on increasing his physical activity as tolerated. The best way to combat treatment-related fatigue is exercise. Walking would be a great place for him to start. This may also help rebuild some of his leg strength and help with knee/leg pain over time.  He agrees and will try to increase his physical activity as tolerated.   Peripheral neuropathy:  -[facility] be [facility] he also has diabetes, which may be contributing as well.  -Currently grade 1-2. Does not significantly interfere with ADLs.  He has had symptomatic improvement with gabapentin BID.  Notes some "staggered" walking, but denies any falls.   -Encouraged him to continue gabapentin as currently prescribed.  If his peripheral neuropathy worsens, it will be important for uKoreato know, as he may require dose-reduction in JPoynorin the future with progressive neuropathy.    Neoplasm related pain:  -Currently on MS Contin 30 mg Q12H and Percocet 10/325 prn. Pain effectively managed on this regimen; generally only requiring Percocet ~2x/day.  He requests refill of MS Contin today.  -[place] Controlled Substance Reporting System reviewed. Paper prescription given to patient today.  -Encouraged him to continue to  pay close attention to his bowel regimen while taking opiates.     Magnesium/Potassium supplementation:  -Patient reports that he has not been taking his magnesium supplements at home recently. He wants to know if he needs to restart them.  -Will add-on serum mag today; if normal then he can continue to hold mag supp for now.  -His potassium has been normal for the past several lab draws as well; continue potassium supplementation as prescribed given periodic diarrhea.        Dispo:  -Continue chemo every 3 weeks.  -Return to cancer  center in ~6 weeks after restaging scans for follow-up and with subsequent chemo.    All questions were answered to patient's stated satisfaction. Encouraged patient to call with any new concerns or questions before his next visit to the cancer center and we can certain see him sooner, if needed.    Plan of care discussed with Dr. Talbert Cage, who agrees with the above aforementioned.    Orders placed this encounter:  Orders Placed This Encounter  Procedures  . Magnesium      Mike Craze, NP [place] [phone]

## 2016-12-30 ENCOUNTER — Encounter (HOSPITAL_BASED_OUTPATIENT_CLINIC_OR_DEPARTMENT_OTHER): Payer: Medicare Other

## 2016-12-30 ENCOUNTER — Encounter (HOSPITAL_COMMUNITY): Payer: Self-pay | Admitting: Adult Health

## 2016-12-30 ENCOUNTER — Other Ambulatory Visit (HOSPITAL_COMMUNITY): Payer: Self-pay | Admitting: Adult Health

## 2016-12-30 ENCOUNTER — Encounter (HOSPITAL_BASED_OUTPATIENT_CLINIC_OR_DEPARTMENT_OTHER): Payer: Medicare Other | Admitting: Adult Health

## 2016-12-30 VITALS — BP 132/64 | HR 55 | Temp 97.8°F | Resp 16 | Wt 240.0 lb

## 2016-12-30 VITALS — BP 154/81 | HR 60 | Temp 97.5°F | Resp 18

## 2016-12-30 DIAGNOSIS — G620 Drug-induced polyneuropathy: Secondary | ICD-10-CM | POA: Diagnosis not present

## 2016-12-30 DIAGNOSIS — C61 Malignant neoplasm of prostate: Secondary | ICD-10-CM

## 2016-12-30 DIAGNOSIS — Z5111 Encounter for antineoplastic chemotherapy: Secondary | ICD-10-CM

## 2016-12-30 DIAGNOSIS — G893 Neoplasm related pain (acute) (chronic): Secondary | ICD-10-CM | POA: Diagnosis not present

## 2016-12-30 DIAGNOSIS — C7951 Secondary malignant neoplasm of bone: Secondary | ICD-10-CM

## 2016-12-30 DIAGNOSIS — I119 Hypertensive heart disease without heart failure: Secondary | ICD-10-CM | POA: Diagnosis not present

## 2016-12-30 DIAGNOSIS — R5383 Other fatigue: Secondary | ICD-10-CM | POA: Diagnosis not present

## 2016-12-30 DIAGNOSIS — K769 Liver disease, unspecified: Secondary | ICD-10-CM | POA: Diagnosis not present

## 2016-12-30 DIAGNOSIS — Z9221 Personal history of antineoplastic chemotherapy: Secondary | ICD-10-CM | POA: Diagnosis not present

## 2016-12-30 DIAGNOSIS — N281 Cyst of kidney, acquired: Secondary | ICD-10-CM | POA: Diagnosis not present

## 2016-12-30 LAB — CBC WITH DIFFERENTIAL/PLATELET
Basophils Absolute: 0 10*3/uL (ref 0.0–0.1)
Basophils Relative: 0 %
EOS PCT: 1 %
Eosinophils Absolute: 0.1 10*3/uL (ref 0.0–0.7)
HCT: 34.7 % — ABNORMAL LOW (ref 39.0–52.0)
Hemoglobin: 11.9 g/dL — ABNORMAL LOW (ref 13.0–17.0)
LYMPHS ABS: 1 10*3/uL (ref 0.7–4.0)
LYMPHS PCT: 11 %
MCH: 32 pg (ref 26.0–34.0)
MCHC: 34.3 g/dL (ref 30.0–36.0)
MCV: 93.3 fL (ref 78.0–100.0)
MONO ABS: 0.7 10*3/uL (ref 0.1–1.0)
MONOS PCT: 7 %
Neutro Abs: 7.2 10*3/uL (ref 1.7–7.7)
Neutrophils Relative %: 81 %
PLATELETS: 182 10*3/uL (ref 150–400)
RBC: 3.72 MIL/uL — AB (ref 4.22–5.81)
RDW: 14.9 % (ref 11.5–15.5)
WBC: 8.9 10*3/uL (ref 4.0–10.5)

## 2016-12-30 LAB — COMPREHENSIVE METABOLIC PANEL
ALT: 22 U/L (ref 17–63)
AST: 24 U/L (ref 15–41)
Albumin: 3.9 g/dL (ref 3.5–5.0)
Alkaline Phosphatase: 93 U/L (ref 38–126)
Anion gap: 8 (ref 5–15)
BUN: 17 mg/dL (ref 6–20)
CHLORIDE: 101 mmol/L (ref 101–111)
CO2: 26 mmol/L (ref 22–32)
CREATININE: 0.69 mg/dL (ref 0.61–1.24)
Calcium: 9.4 mg/dL (ref 8.9–10.3)
GFR calc Af Amer: >60 mL/min (ref >60)
GFR calc non Af Amer: >60 mL/min (ref >60)
Glucose, Bld: 118 mg/dL — ABNORMAL HIGH (ref 65–99)
Potassium: 3.4 mmol/L — ABNORMAL LOW (ref 3.5–5.1)
Sodium: 135 mmol/L (ref 135–145)
Total Bilirubin: 0.6 mg/dL (ref 0.3–1.2)
Total Protein: 6.2 g/dL — ABNORMAL LOW (ref 6.5–8.1)

## 2016-12-30 LAB — MAGNESIUM: MAGNESIUM: 1.7 mg/dL (ref 1.7–2.4)

## 2016-12-30 MED ORDER — DEXAMETHASONE SODIUM PHOSPHATE 10 MG/ML IJ SOLN
10.0000 mg | Freq: Once | INTRAMUSCULAR | Status: AC
  Administered 2016-12-30: 10 mg via INTRAVENOUS

## 2016-12-30 MED ORDER — CYANOCOBALAMIN 1000 MCG/ML IJ SOLN
INTRAMUSCULAR | Status: AC
  Filled 2016-12-30: qty 1

## 2016-12-30 MED ORDER — MORPHINE SULFATE ER 30 MG PO TBCR: 30.0000 mg | EXTENDED_RELEASE_TABLET | Freq: Two times a day (BID) | ORAL | 0 refills | Status: DC

## 2016-12-30 MED ORDER — SODIUM CHLORIDE 0.9 % IV SOLN
Freq: Once | INTRAVENOUS | Status: AC
  Administered 2016-12-30: 11:00:00 via INTRAVENOUS

## 2016-12-30 MED ORDER — PEGFILGRASTIM 6 MG/0.6ML ~~LOC~~ PSKT
6.0000 mg | PREFILLED_SYRINGE | Freq: Once | SUBCUTANEOUS | Status: AC
  Administered 2016-12-30: 6 mg via SUBCUTANEOUS

## 2016-12-30 MED ORDER — DENOSUMAB 120 MG/1.7ML ~~LOC~~ SOLN
120.0000 mg | Freq: Once | SUBCUTANEOUS | Status: AC
  Administered 2016-12-30: 120 mg via SUBCUTANEOUS
  Filled 2016-12-30: qty 1.7

## 2016-12-30 MED ORDER — DIPHENHYDRAMINE HCL 50 MG/ML IJ SOLN
INTRAMUSCULAR | Status: AC
  Filled 2016-12-30: qty 1

## 2016-12-30 MED ORDER — FAMOTIDINE IN NACL 20-0.9 MG/50ML-% IV SOLN
20.0000 mg | Freq: Once | INTRAVENOUS | Status: AC
  Administered 2016-12-30: 20 mg via INTRAVENOUS

## 2016-12-30 MED ORDER — DEXTROSE 5 % IV SOLN
20.0000 mg/m2 | Freq: Once | INTRAVENOUS | Status: AC
  Administered 2016-12-30: 48 mg via INTRAVENOUS
  Filled 2016-12-30: qty 4.8

## 2016-12-30 MED ORDER — SODIUM CHLORIDE 0.9% FLUSH
10.0000 mL | INTRAVENOUS | Status: DC | PRN
  Administered 2016-12-30: 10 mL
  Filled 2016-12-30: qty 10

## 2016-12-30 MED ORDER — PEGFILGRASTIM 6 MG/0.6ML ~~LOC~~ PSKT
PREFILLED_SYRINGE | SUBCUTANEOUS | Status: AC
  Filled 2016-12-30: qty 0.6

## 2016-12-30 MED ORDER — DIPHENHYDRAMINE HCL 50 MG/ML IJ SOLN
25.0000 mg | Freq: Once | INTRAMUSCULAR | Status: AC
  Administered 2016-12-30: 25 mg via INTRAVENOUS

## 2016-12-30 MED ORDER — HEPARIN SOD (PORK) LOCK FLUSH 100 UNIT/ML IV SOLN
500.0000 [IU] | Freq: Once | INTRAVENOUS | Status: AC | PRN
  Administered 2016-12-30: 500 [IU]
  Filled 2016-12-30: qty 5

## 2016-12-30 MED ORDER — FAMOTIDINE IN NACL 20-0.9 MG/50ML-% IV SOLN
INTRAVENOUS | Status: AC
  Filled 2016-12-30: qty 50

## 2016-12-30 MED ORDER — LEUPROLIDE ACETATE 7.5 MG IM KIT
7.5000 mg | PACK | INTRAMUSCULAR | Status: DC
  Administered 2016-12-30: 7.5 mg via INTRAMUSCULAR
  Filled 2016-12-30: qty 7.5

## 2016-12-30 MED ORDER — DEXAMETHASONE SODIUM PHOSPHATE 10 MG/ML IJ SOLN
INTRAMUSCULAR | Status: AC
Start: 2016-12-30 — End: 2016-12-30
  Filled 2016-12-30: qty 1

## 2016-12-30 NOTE — Progress Notes (Signed)
Labs reviewed with MD. Proceed with treatment.  [name] presents today for injection per MD orders. xgeva 120 mg administered SQ in left Abdomen. Administration without incident. Patient tolerated well.  [name] presents today for injection per MD orders. Lupron 7.5 mg administered IM in right upper buttocks.  Administration without incident. Patient tolerated well.  Marland KitchenCristy Friedlander arrived today for [facility] neulasta on body injector. See MAR for administration details. Injector in place and engaged with green light indicator on flashing. Tolerated application with out problems.    Treatment given per orders. Patient tolerated it well without problems. Vitals stable and discharged home from clinic ambulatory. Follow up as scheduled.

## 2016-12-30 NOTE — Patient Instructions (Addendum)
[place] at [facility] Discharge Instructions  RECOMMENDATIONS MADE BY THE CONSULTANT AND ANY TEST RESULTS WILL BE SENT TO YOUR REFERRING PHYSICIAN.  You were seen today by Mike Craze, NP Continue every 3 weeks with your jevtana  Follow up in 6 weeks with treatment and provider See schedulers up front for appointments   Thank you for choosing [place] at [facility] to provide your oncology and hematology care.  To afford each patient quality time with our provider, please arrive at least 15 minutes before your scheduled appointment time.    If you have a lab appointment with the [facility] please come in thru the  Main Entrance and check in at the main information desk  You need to re-schedule your appointment should you arrive 10 or more minutes late.  We strive to give you quality time with our providers, and arriving late affects you and other patients whose appointments are after yours.  Also, if you no show three or more times for appointments you may be dismissed from the clinic at the providers discretion.     Again, thank you for choosing [facility].  Our hope is that these requests will decrease the amount of time that you wait before being seen by our physicians.       _____________________________________________________________  Should you have questions after your visit to [facility], please contact our office at [phone] between the hours of 8:30 a.m. and 4:30 p.m.  Voicemails left after 4:30 p.m. will not be returned until the following business day.  For prescription refill requests, have your pharmacy contact our office.       Resources For Cancer Patients and their Caregivers ? American Cancer Society: Can assist with transportation, wigs, general needs, runs Look Good Feel Better.        ([phone] ? Cancer Care: Provides financial assistance, online support groups,  medication/co-pay assistance.  1-800-813-HOPE 705-629-4370) ? [place] Assists [place] cancer patients and their families through emotional , educational and financial support.  8705914706 ? [place] DSS Where to apply for food stamps, Medicaid and utility assistance. 318-540-1972 ? [employer]: Transportation to medical appointments. 778-386-9448 ? Social Security Administration: May apply for disability if have a Stage IV cancer. [phone] ? [employer], Disability and Transit Services: Assists with nutrition, care and transit needs. [place] Support Programs: @10RELATIVEDAYS @ > Cancer Support Group  2nd Tuesday of the month 1pm-2pm, Journey Room  > Creative Journey  3rd Tuesday of the month 1130am-1pm, Journey Room  > Look Good Feel Better  1st Wednesday of the month 10am-12 noon, Journey Room (Call [employer] to register [phone])

## 2016-12-30 NOTE — Patient Instructions (Signed)
[facility] Discharge Instructions for Patients Receiving Chemotherapy   Beginning January 23rd 2017 lab work for the Cancer Center will be done in the  Main lab at [facility] on 1st floor. If you have a lab appointment with the Cancer Center please come in thru the  Main Entrance and check in at the main information desk   Today you received the following chemotherapy agents   To help prevent nausea and vomiting after your treatment, we encourage you to take your nausea medication     If you develop nausea and vomiting, or diarrhea that is not controlled by your medication, call the clinic.  The clinic phone number is [phone]. Office hours are Monday-Friday 8:30am-5:00pm.  BELOW ARE SYMPTOMS THAT SHOULD BE REPORTED IMMEDIATELY:  *FEVER GREATER THAN 101.0 F  *CHILLS WITH OR WITHOUT FEVER  NAUSEA AND VOMITING THAT IS NOT CONTROLLED WITH YOUR NAUSEA MEDICATION  *UNUSUAL SHORTNESS OF BREATH  *UNUSUAL BRUISING OR BLEEDING  TENDERNESS IN MOUTH AND THROAT WITH OR WITHOUT PRESENCE OF ULCERS  *URINARY PROBLEMS  *BOWEL PROBLEMS  UNUSUAL RASH Items with * indicate a potential emergency and should be followed up as soon as possible. If you have an emergency after office hours please contact your primary care physician or go to the nearest emergency department.  Please call the clinic during office hours if you have any questions or concerns.   You may also contact the Patient Navigator at [phone] should you have any questions or need assistance in obtaining follow up care.      Resources For Cancer Patients and their Caregivers ? American Cancer Society: Can assist with transportation, wigs, general needs, runs Look Good Feel Better.        1-888-227-6333 ? Cancer Care: Provides financial assistance, online support groups, medication/co-pay assistance.  1-800-813-HOPE (4673) ? [facility] Assists [place] cancer  patients and their families through emotional , educational and financial support.  336-427-4357 ? [place] DSS Where to apply for food stamps, Medicaid and utility assistance. 336-342-1394 ? [employer]: Transportation to medical appointments. 336-347-2287 ? Social Security Administration: May apply for disability if have a Stage IV cancer. 336-342-7796 1-800-772-1213 ? [place] [employer], Disability and Transit Services: Assists with nutrition, care and transit needs. [phone]         

## 2017-01-20 ENCOUNTER — Ambulatory Visit (HOSPITAL_COMMUNITY): Payer: Medicare Other

## 2017-01-20 ENCOUNTER — Encounter (HOSPITAL_COMMUNITY): Payer: Medicare Other | Attending: Hematology & Oncology

## 2017-01-20 ENCOUNTER — Encounter (HOSPITAL_COMMUNITY): Payer: Self-pay

## 2017-01-20 VITALS — BP 145/90 | HR 62 | Temp 98.1°F | Resp 18 | Wt 245.0 lb

## 2017-01-20 DIAGNOSIS — C61 Malignant neoplasm of prostate: Secondary | ICD-10-CM | POA: Diagnosis not present

## 2017-01-20 DIAGNOSIS — C7951 Secondary malignant neoplasm of bone: Secondary | ICD-10-CM | POA: Diagnosis not present

## 2017-01-20 DIAGNOSIS — Z5111 Encounter for antineoplastic chemotherapy: Secondary | ICD-10-CM

## 2017-01-20 LAB — CBC WITH DIFFERENTIAL/PLATELET
BASOS ABS: 0 10*3/uL (ref 0.0–0.1)
BASOS PCT: 0 %
EOS ABS: 0 10*3/uL (ref 0.0–0.7)
EOS PCT: 1 %
HCT: 35.1 % — ABNORMAL LOW (ref 39.0–52.0)
Hemoglobin: 11.5 g/dL — ABNORMAL LOW (ref 13.0–17.0)
Lymphocytes Relative: 10 %
Lymphs Abs: 0.8 10*3/uL (ref 0.7–4.0)
MCH: 32.4 pg (ref 26.0–34.0)
MCHC: 32.8 g/dL (ref 30.0–36.0)
MCV: 98.9 fL (ref 78.0–100.0)
Monocytes Absolute: 0.4 10*3/uL (ref 0.1–1.0)
Monocytes Relative: 5 %
Neutro Abs: 6.4 10*3/uL (ref 1.7–7.7)
Neutrophils Relative %: 84 %
PLATELETS: 187 10*3/uL (ref 150–400)
RBC: 3.55 MIL/uL — AB (ref 4.22–5.81)
RDW: 15.2 % (ref 11.5–15.5)
WBC: 7.7 10*3/uL (ref 4.0–10.5)

## 2017-01-20 LAB — COMPREHENSIVE METABOLIC PANEL
ALT: 19 U/L (ref 17–63)
AST: 25 U/L (ref 15–41)
Albumin: 3.8 g/dL (ref 3.5–5.0)
Alkaline Phosphatase: 94 U/L (ref 38–126)
Anion gap: 7 (ref 5–15)
BILIRUBIN TOTAL: 0.5 mg/dL (ref 0.3–1.2)
BUN: 15 mg/dL (ref 6–20)
CALCIUM: 9 mg/dL (ref 8.9–10.3)
CO2: 27 mmol/L (ref 22–32)
CREATININE: 0.75 mg/dL (ref 0.61–1.24)
Chloride: 104 mmol/L (ref 101–111)
GFR calc Af Amer: >60 mL/min (ref >60)
Glucose, Bld: 183 mg/dL — ABNORMAL HIGH (ref 65–99)
POTASSIUM: 3.7 mmol/L (ref 3.5–5.1)
Sodium: 138 mmol/L (ref 135–145)
TOTAL PROTEIN: 6.1 g/dL — AB (ref 6.5–8.1)

## 2017-01-20 LAB — MAGNESIUM: Magnesium: 1.8 mg/dL (ref 1.7–2.4)

## 2017-01-20 MED ORDER — DEXAMETHASONE SODIUM PHOSPHATE 10 MG/ML IJ SOLN
INTRAMUSCULAR | Status: AC
  Filled 2017-01-20: qty 1

## 2017-01-20 MED ORDER — OXYCODONE-ACETAMINOPHEN 10-325 MG PO TABS: 1.0000 | ORAL_TABLET | Freq: Four times a day (QID) | ORAL | 0 refills | Status: DC | PRN

## 2017-01-20 MED ORDER — DIPHENHYDRAMINE HCL 50 MG/ML IJ SOLN
INTRAMUSCULAR | Status: AC
  Filled 2017-01-20: qty 1

## 2017-01-20 MED ORDER — CABAZITAXEL CHEMO INJECTION 60 MG/6ML W/DILUENT
20.0000 mg/m2 | Freq: Once | INTRAVENOUS | Status: AC
  Administered 2017-01-20: 48 mg via INTRAVENOUS
  Filled 2017-01-20: qty 4.8

## 2017-01-20 MED ORDER — SODIUM CHLORIDE 0.9 % IV SOLN
Freq: Once | INTRAVENOUS | Status: AC
  Administered 2017-01-20: 11:00:00 via INTRAVENOUS

## 2017-01-20 MED ORDER — FAMOTIDINE IN NACL 20-0.9 MG/50ML-% IV SOLN
20.0000 mg | Freq: Once | INTRAVENOUS | Status: AC
  Administered 2017-01-20: 20 mg via INTRAVENOUS

## 2017-01-20 MED ORDER — DEXAMETHASONE SODIUM PHOSPHATE 10 MG/ML IJ SOLN
10.0000 mg | Freq: Once | INTRAMUSCULAR | Status: AC
  Administered 2017-01-20: 10 mg via INTRAVENOUS

## 2017-01-20 MED ORDER — PEGFILGRASTIM 6 MG/0.6ML ~~LOC~~ PSKT
6.0000 mg | PREFILLED_SYRINGE | Freq: Once | SUBCUTANEOUS | Status: AC
  Administered 2017-01-20: 6 mg via SUBCUTANEOUS

## 2017-01-20 MED ORDER — HEPARIN SOD (PORK) LOCK FLUSH 100 UNIT/ML IV SOLN
500.0000 [IU] | Freq: Once | INTRAVENOUS | Status: AC | PRN
  Administered 2017-01-20: 500 [IU]

## 2017-01-20 MED ORDER — SODIUM CHLORIDE 0.9% FLUSH
10.0000 mL | INTRAVENOUS | Status: DC | PRN
  Administered 2017-01-20: 10 mL
  Filled 2017-01-20: qty 10

## 2017-01-20 MED ORDER — FAMOTIDINE IN NACL 20-0.9 MG/50ML-% IV SOLN
INTRAVENOUS | Status: AC
  Filled 2017-01-20: qty 50

## 2017-01-20 MED ORDER — DIPHENHYDRAMINE HCL 50 MG/ML IJ SOLN
25.0000 mg | Freq: Once | INTRAMUSCULAR | Status: AC
  Administered 2017-01-20: 25 mg via INTRAVENOUS

## 2017-01-20 NOTE — Patient Instructions (Signed)
[facility] Discharge Instructions for Patients Receiving Chemotherapy   Beginning January 23rd 2017 lab work for the [facility] will be done in the  Main lab at [facility] on 1st floor. If you have a lab appointment with the [place] [facility] please come in thru the  Main Entrance and check in at the main information desk   Today you received the following chemotherapy agents Jevtana as well as Neulasta on-pro. Follow-up as scheduled. Call clinic for any questions or concerns  To help prevent nausea and vomiting after your treatment, we encourage you to take your nausea medication   If you develop nausea and vomiting, or diarrhea that is not controlled by your medication, call the clinic.  The clinic phone number is [phone]. Office hours are Monday-Friday 8:30am-5:00pm.  BELOW ARE SYMPTOMS THAT SHOULD BE REPORTED IMMEDIATELY:  *FEVER GREATER THAN 101.0 F  *CHILLS WITH OR WITHOUT FEVER  NAUSEA AND VOMITING THAT IS NOT CONTROLLED WITH YOUR NAUSEA MEDICATION  *UNUSUAL SHORTNESS OF BREATH  *UNUSUAL BRUISING OR BLEEDING  TENDERNESS IN MOUTH AND THROAT WITH OR WITHOUT PRESENCE OF ULCERS  *URINARY PROBLEMS  *BOWEL PROBLEMS  UNUSUAL RASH Items with * indicate a potential emergency and should be followed up as soon as possible. If you have an emergency after office hours please contact your primary care physician or go to the nearest emergency department.  Please call the clinic during office hours if you have any questions or concerns.   You may also contact the Patient Navigator at [phone] should you have any questions or need assistance in obtaining follow up care.      Resources For Cancer Patients and their Caregivers ? American Cancer Society: Can assist with transportation, wigs, general needs, runs Look Good Feel Better.        [phone] ? Cancer Care: Provides financial assistance, online support groups, medication/co-pay  assistance.  1-800-813-HOPE 647-050-5260) ? [place] Assists [place] cancer patients and their families through emotional , educational and financial support.  716-113-0612 ? [place] DSS Where to apply for food stamps, Medicaid and utility assistance. 248-544-7319 ? [employer]: Transportation to medical appointments. 418-520-8967 ? Social Security Administration: May apply for disability if have a Stage IV cancer. [phone] (443) [phone] ? [employer], Disability and Transit Services: Assists with nutrition, care and transit needs. [phone]

## 2017-01-20 NOTE — Progress Notes (Signed)
Micheal Davis tolerated Jevtana infusion and Neulasta on-pro well without complaints or incident. Labs reviewed with Dr. Talbert Cage prior to administering this medication. VSS upon discharge. Neulasta on-pro applied to pt's right arm with green indicator light flashing upon discharge. Pt discharged self ambulatory in satisfactory condition accompanied by his wife

## 2017-01-27 ENCOUNTER — Encounter (HOSPITAL_BASED_OUTPATIENT_CLINIC_OR_DEPARTMENT_OTHER): Payer: Medicare Other

## 2017-01-27 ENCOUNTER — Encounter (HOSPITAL_COMMUNITY): Payer: Medicare Other

## 2017-01-27 ENCOUNTER — Encounter (HOSPITAL_COMMUNITY): Payer: Self-pay

## 2017-01-27 VITALS — BP 153/82 | HR 63 | Temp 98.0°F | Resp 18 | Wt 241.4 lb

## 2017-01-27 DIAGNOSIS — C61 Malignant neoplasm of prostate: Secondary | ICD-10-CM

## 2017-01-27 DIAGNOSIS — C7951 Secondary malignant neoplasm of bone: Secondary | ICD-10-CM

## 2017-01-27 DIAGNOSIS — Z5111 Encounter for antineoplastic chemotherapy: Secondary | ICD-10-CM

## 2017-01-27 LAB — CBC WITH DIFFERENTIAL/PLATELET
BASOS ABS: 0.1 10*3/uL (ref 0.0–0.1)
BASOS PCT: 0 %
EOS PCT: 2 %
Eosinophils Absolute: 0.3 10*3/uL (ref 0.0–0.7)
HCT: 38.6 % — ABNORMAL LOW (ref 39.0–52.0)
Hemoglobin: 12.2 g/dL — ABNORMAL LOW (ref 13.0–17.0)
LYMPHS PCT: 7 %
Lymphs Abs: 1.2 10*3/uL (ref 0.7–4.0)
MCH: 31.9 pg (ref 26.0–34.0)
MCHC: 31.6 g/dL (ref 30.0–36.0)
MCV: 101 fL — AB (ref 78.0–100.0)
MONO ABS: 1.2 10*3/uL — AB (ref 0.1–1.0)
MONOS PCT: 8 %
Neutro Abs: 13.3 10*3/uL — ABNORMAL HIGH (ref 1.7–7.7)
Neutrophils Relative %: 83 %
PLATELETS: 176 10*3/uL (ref 150–400)
RBC: 3.82 MIL/uL — ABNORMAL LOW (ref 4.22–5.81)
RDW: 15.5 % (ref 11.5–15.5)
WBC: 16 10*3/uL — ABNORMAL HIGH (ref 4.0–10.5)

## 2017-01-27 LAB — COMPREHENSIVE METABOLIC PANEL
ALT: 25 U/L (ref 17–63)
ANION GAP: 9 (ref 5–15)
AST: 23 U/L (ref 15–41)
Albumin: 3.9 g/dL (ref 3.5–5.0)
Alkaline Phosphatase: 147 U/L — ABNORMAL HIGH (ref 38–126)
BILIRUBIN TOTAL: 0.8 mg/dL (ref 0.3–1.2)
BUN: 14 mg/dL (ref 6–20)
CO2: 28 mmol/L (ref 22–32)
CREATININE: 0.79 mg/dL (ref 0.61–1.24)
Calcium: 9.6 mg/dL (ref 8.9–10.3)
Chloride: 101 mmol/L (ref 101–111)
GFR calc Af Amer: >60 mL/min (ref >60)
Glucose, Bld: 125 mg/dL — ABNORMAL HIGH (ref 65–99)
Potassium: 4 mmol/L (ref 3.5–5.1)
Sodium: 138 mmol/L (ref 135–145)
Total Protein: 6.2 g/dL — ABNORMAL LOW (ref 6.5–8.1)

## 2017-01-27 MED ORDER — LEUPROLIDE ACETATE 7.5 MG IM KIT
7.5000 mg | PACK | INTRAMUSCULAR | Status: DC
  Administered 2017-01-27: 7.5 mg via INTRAMUSCULAR
  Filled 2017-01-27: qty 7.5

## 2017-01-27 MED ORDER — DENOSUMAB 120 MG/1.7ML ~~LOC~~ SOLN
120.0000 mg | Freq: Once | SUBCUTANEOUS | Status: AC
  Administered 2017-01-27: 120 mg via SUBCUTANEOUS
  Filled 2017-01-27: qty 1.7

## 2017-01-27 MED ORDER — MORPHINE SULFATE ER 30 MG PO TBCR: 30.0000 mg | EXTENDED_RELEASE_TABLET | Freq: Two times a day (BID) | ORAL | 0 refills | Status: DC

## 2017-01-27 NOTE — Progress Notes (Signed)
[name] tolerated Xgeva and Lupron injections well without complaints or incident. Labs reviewed and pt denied any tooth,jaw or leg pain and no recent or future dental appts prior to administering these injections. Calcium 9.6 today. VSS Pt discharged self ambulatory in satisfactory condition

## 2017-01-27 NOTE — Patient Instructions (Signed)
[place] at [facility] Discharge Instructions  RECOMMENDATIONS MADE BY THE CONSULTANT AND ANY TEST RESULTS WILL BE SENT TO YOUR REFERRING PHYSICIAN.  Received Xgeva and Lupron injections today. Follow-up as scheduled. Call clinic for any questions or concerns  Thank you for choosing [place] at [facility] to provide your oncology and hematology care.  To afford each patient quality time with our provider, please arrive at least 15 minutes before your scheduled appointment time.    If you have a lab appointment with the [facility] please come in thru the  Main Entrance and check in at the main information desk  You need to re-schedule your appointment should you arrive 10 or more minutes late.  We strive to give you quality time with our providers, and arriving late affects you and other patients whose appointments are after yours.  Also, if you no show three or more times for appointments you may be dismissed from the clinic at the providers discretion.     Again, thank you for choosing [facility].  Our hope is that these requests will decrease the amount of time that you wait before being seen by our physicians.       _____________________________________________________________  Should you have questions after your visit to [facility], please contact our office at [phone] between the hours of 8:30 a.m. and 4:30 p.m.  Voicemails left after 4:30 p.m. will not be returned until the following business day.  For prescription refill requests, have your pharmacy contact our office.       Resources For Cancer Patients and their Caregivers ? American Cancer Society: Can assist with transportation, wigs, general needs, runs Look Good Feel Better.        707-077-5726 ? Cancer Care: Provides financial assistance, online support groups, medication/co-pay assistance.  1-800-813-HOPE 208-084-2041) ? [place] Assists [place] cancer patients and their families through emotional , educational and financial support.  (315)650-2860 ? [place] DSS Where to apply for food stamps, Medicaid and utility assistance. 762-729-3689 ? [employer]: Transportation to medical appointments. 986-799-2227 ? Social Security Administration: May apply for disability if have a Stage IV cancer. ([phone] 305-295-4917 ? [employer], Disability and Transit Services: Assists with nutrition, care and transit needs. [place] Support Programs: @10RELATIVEDAYS @ > Cancer Support Group  2nd Tuesday of the month 1pm-2pm, Journey Room  > Creative Journey  3rd Tuesday of the month 1130am-1pm, Journey Room  > Look Good Feel Better  1st Wednesday of the month 10am-12 noon, Journey Room (Call [place] to register [phone])

## 2017-01-29 ENCOUNTER — Other Ambulatory Visit (HOSPITAL_COMMUNITY): Payer: Self-pay | Admitting: Adult Health

## 2017-01-29 DIAGNOSIS — C7951 Secondary malignant neoplasm of bone: Principal | ICD-10-CM

## 2017-01-29 DIAGNOSIS — C61 Malignant neoplasm of prostate: Secondary | ICD-10-CM

## 2017-02-03 DIAGNOSIS — H2512 Age-related nuclear cataract, left eye: Secondary | ICD-10-CM | POA: Diagnosis not present

## 2017-02-09 NOTE — Progress Notes (Signed)
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      10/04/2012 - 05/16/2013 Chemotherapy    Depo-Lupron and Casodex initiated      05/16/2013 -  Chemotherapy    Depo-Lupron monthly continued      05/16/2013 Progression    Progression by PSA elevation      05/16/2013 - 10/22/2014 Chemotherapy    Abiraterone and prednisone initiated in conjunction with ongoing Depo-Lupron.  Denosumab also ongoing.      10/23/2014 Progression    PSA increasing from 0.2- 1.6 in less than 6 months.        10/23/2014 - 01/30/2015 Chemotherapy    Enzalutamide and Prednisone (5 mg in AM and 2.5 mg in PM)      01/30/2015 Imaging    Bone scan- New focus of intense activity in right proximal humerus.  Interim increase in activity over left hip.      01/30/2015 Progression    Bone scan reveals new disease in right humerus consistent with progression of disease      01/31/2015 Imaging    Right humerus xray- blastic foci in proximal right humeral metaphysis and over right mid-humeral diaphysis.  No evidence of fracture      07/06/2015 Progression    Progression in multiple bones especially L hip and femurs      07/06/2015 Imaging    Bone scan- progressive multifocal osseous metastases in the right proximal femora and distal femoral shafts.  Stable update in bilateral ribs suspicious for small rib metastases      07/16/2015 - 07/31/2015 Radiation Therapy    Left femur 30 Gy in 10 fractions by  Dr. Tammi Klippel      11/23/2015 - 01/04/2016 Chemotherapy    The patient had pegfilgrastim (NEULASTA ONPRO KIT) injection 6 mg, 6 mg, Subcutaneous, Once, 3 of 7 cycles  DOCEtaxel (TAXOTERE) 180 mg in dextrose 5 % 250 mL chemo infusion, 75 mg/m2 = 180 mg, Intravenous,  Once, 3 of 7 cycles Dose modification: 64 mg/m2 (original dose 75 mg/m2, Cycle 2, Reason: Dose not tolerated)  pegfilgrastim (NEULASTA ONPRO KIT) injection 6 mg, 6 mg, Subcutaneous, Once, 0 of 4 cycles  cabazitaxel (JEVTANA) 60 mg in dextrose 5 % 250 mL chemo infusion, 25 mg/m2, Intravenous,  Once, 0 of 4 cycles  for chemotherapy treatment.        11/30/2015 Adverse Reaction    Diarrhea (secondary to chemotherapy) and dehydration requiring IV fluids      12/14/2015 Treatment Plan Change    Docetaxel dose reduced by 15%      12/31/2015 Procedure    Port placed by Dr. Arnoldo Morale      01/28/2016 -  Chemotherapy    Cabazitaxel (Jevtana)       03/27/2016 Imaging    CT Chest, Abdomen, and Pelvis with contrast 1. Diffuse sclerotic osseous metastatic disease in the chest, abdomen, and pelvis without acute fracture identified. 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 These appear chronic. 2. The prostate gland is normal in size and no adenopathy is currently identified. 3. On a prior MRI of 10/04/2012, there was a posterior right hepatic lobe lesion. This lesion is not currently visible on today' s CT. This could be due to differences in cons acuity between CT or MRI, or resolution of the lesion. 4. Coronary, aortic arch, and branch vessel atherosclerotic vascular disease. Aortoiliac atherosclerotic vascular disease. 5. Single bilateral renal cysts.      04/16/2016 Imaging    Bone scan- Findings consist with progressive metastatic disease. Activity over the proximal right humerus and proximal bilateral femurs are worrisome for the possible development of pathologic fractures.       08/21/2016 Imaging    CT C/A/P: IMPRESSION: No significant change since 03/27/2016 CT. Diffuse bony metastases without other evidence of metastatic disease.      08/21/2016 Imaging    Bone Scan: IMPRESSION: Multiple areas of increased activity again noted throughout the axial and appendicular skeleton in similar locations as prior exam. Intensity of uptake is increased from prior exam suggesting progressive disease. Lesions present in the proximal humeri, proximal femurs, and the mid right femur susceptible to pathologic fracture.      12/02/2016 Imaging    CT C/A/P: IMPRESSION: 1. Overall stable appearance of diffuse osseous metastatic disease and resulting patchy sclerosis. 2. The patient had a posterior right hepatic lobe lesion on prior MRI from 2014 which is been relatively occult on CT. Given the lack of progression I suspect that this is benign or has been effectively treated. 3. Aortic Atherosclerosis (ICD10-I70.0). Coronary atherosclerosis with mild cardiomegaly. 4. Cholelithiasis. 5. Bilateral benign renal cysts. 6. 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      12/02/2016 Imaging    Bone Scan: IMPRESSION: 1. Widespread osseous metastatic disease. Multiplicity is similar to previous exam with interval increase in degree of tracer uptake associated with multiple lesions.          INTERVAL HISTORY:  Mr. Schnorr 71 y.o. male returns for routine follow-up for metastatic prostate cancer.    Due next cycle of Jevtana today.  Here today with his wife.  Overall, he tells me he has been feeling "pretty good."  Appetite and energy levels both 75%.  Endorses that his bilateral knee pain and bilateral peripheral neuropathy to his feet a both a bit worse.  He has been taking the gabapentin BID without  much improvement in his numbness/tingling symptoms. "I can't really tell much difference at all since I started taking that medicine."  He has had frequent falls in the past few months; states that he "loses his footing" and falls. Denies any syncope or pre-syncopal episodes associated with the falls. No serious injuries reported with the falls. "My feet just stay numb and they might be a little bit worse but not too bad."   Remains on MS Contin Q12H; on average, he requires 1-2 doses of Percocet for breakthrough pain.  On a "bad day", the max number of Percocet doses is 4.    His wife shares with me that his urine is intermittently black in color. Patient denies any dysuria or hematuria that he is aware of. He states that he increases his water consumption and his urine color clears up.  He tells me that he even brought in a  urine sample recently "and everything looked okay with it."  Denies any fever/chills.  Denies any diarrhea, constipation, N&V, or rash.  Endorses intermittent hot flashes.    He is planning on having cataract surgery sometime in February. He is working on planning this procedure for one of his "off weeks" of chemo.   Overall, he feels ready for next cycle of Jevtana today.     REVIEW OF SYSTEMS:  Review of Systems  Constitutional: Positive for fatigue. Negative for chills and fever.  HENT:  Negative.   Eyes: Negative.   Respiratory: Negative.  Negative for cough and shortness of breath.   Cardiovascular: Positive for leg swelling.  Gastrointestinal: Negative.  Negative for abdominal pain, blood in stool, constipation, diarrhea, nausea and vomiting.  Endocrine: Positive for hot flashes.  Genitourinary: Negative.  Negative for dysuria and hematuria.   Musculoskeletal: Positive for arthralgias.  Skin: Negative.  Negative for rash.  Neurological: Positive for numbness.  Hematological: Negative.   Psychiatric/Behavioral: Negative.      PAST MEDICAL/SURGICAL HISTORY:    Past Medical History:  Diagnosis Date  . Diabetes mellitus without complication ([facility])   . Hypertension   . Prostate cancer ([facility]) 09/05/2015  . Prostate cancer metastatic to bone ([facility]) 09/05/2015  . Sleep apnea   . Thyroid disease    Past Surgical History:  Procedure Laterality Date  . CATARACT EXTRACTION    . PORTACATH PLACEMENT Left 12/31/2015   Procedure: INSERTION PORT-A-CATH LEFT SUBCLAVIAN;  Surgeon: Aviva Signs, MD;  Location: [facility];  Service: General;  Laterality: Left;  . REPLACEMENT TOTAL KNEE Left      SOCIAL HISTORY:  Social History   Socioeconomic History  . Marital status: Married    Spouse name: Not on file  . Number of children: Not on file  . Years of education: Not on file  . Highest education level: Not on file  Social Needs  . Financial resource strain: Not on file  . Food insecurity - worry: Not on file  . Food insecurity - inability: Not on file  . Transportation needs - medical: Not on file  . Transportation needs - non-medical: Not on file  Occupational History  . Not on file  Tobacco Use  . Smoking status: Never Smoker  . Smokeless tobacco: Never Used  Substance and Sexual Activity  . Alcohol use: Yes    Comment: 1 beer each month  . Drug use: No  . Sexual activity: No    Comment: married  Other Topics Concern  . Not on file  Social History Narrative  . Not on file    FAMILY HISTORY:  History reviewed. No pertinent family history.  CURRENT MEDICATIONS:  Outpatient Encounter Medications as of 02/10/2017  Medication Sig  . atenolol (TENORMIN) 100 MG tablet Take 100 mg by mouth daily.  Marland Kitchen atorvastatin (LIPITOR) 20 MG tablet Take 10 mg by mouth daily.  . Cabazitaxel (JEVTANA IV) Inject into the vein. Every 3 weeks  . calcium carbonate (OS-CAL - DOSED IN MG OF ELEMENTAL CALCIUM) 1250 (500 Ca) MG tablet Take 2 tablets by mouth daily with breakfast.   . diltiazem (CARDIZEM CD) 300 MG 24 hr capsule Take 300 mg by mouth daily.  Marland Kitchen esomeprazole  (NEXIUM) 20 MG capsule Take 1 capsule (20 mg total) by mouth daily at 12 noon.  . gabapentin (NEURONTIN) 300 MG capsule Take 1 capsule (300 mg total) by mouth 3 (three) times daily.  . Glucosamine-Chondroit-Vit C-Mn (GLUCOSAMINE 1500 COMPLEX PO) Take 1 tablet  by mouth 2 (two) times daily.   Marland Kitchen KLOR-CON M20 20 MEQ tablet TAKE 1 TABLET BY MOUTH ONCE DAILY  . levothyroxine (SYNTHROID, LEVOTHROID) 25 MCG tablet Take 25 mcg by mouth daily before breakfast.  . lidocaine-prilocaine (EMLA) cream Apply to affected area once  . loperamide (IMODIUM) 1 MG/5ML solution Take 1 mg by mouth as needed for diarrhea or loose stools.  . magic mouthwash w/lidocaine SOLN 1 part of each of the following: Benadryl 12.54m /511m Viscous lidocaine 2%, Maalox. Swish and swallow 5 mL QID.  . Marland KitchenetFORMIN (GLUCOPHAGE) 500 MG tablet Take 500 mg by mouth 2 (two) times daily with a meal.  . morphine (MS CONTIN) 30 MG 12 hr tablet Take 1 tablet (30 mg total) by mouth every 12 (twelve) hours.  . Multiple Vitamin (MULTIVITAMIN WITH MINERALS) TABS tablet Take 1 tablet by mouth daily.  . Omega-3 Fatty Acids (FISH OIL) 1000 MG CAPS Take 1 capsule by mouth daily.  . ondansetron (ZOFRAN) 8 MG tablet Take 1 tablet (8 mg total) by mouth 2 (two) times daily as needed (Nausea or vomiting).  . Marland KitchenxyCODONE-acetaminophen (PERCOCET) 10-325 MG tablet Take 1 tablet by mouth every 6 (six) hours as needed for pain.  . Marland Kitchenegfilgrastim (NEULASTA ONPRO [place]) Inject into the skin. Every 21 days  . predniSONE (DELTASONE) 5 MG tablet TAKE 1 TABLET BY MOUTH TWICE DAILY WITH  A  MEAL  . prochlorperazine (COMPAZINE) 10 MG tablet Take 1 tablet (10 mg total) by mouth every 6 (six) hours as needed (Nausea or vomiting).  . tamsulosin (FLOMAX) 0.4 MG CAPS capsule Take 0.8 mg by mouth daily.   . Marland Kitchenriamterene-hydrochlorothiazide (MAXZIDE-25) 37.5-25 MG tablet Take 1 tablet by mouth daily. for high blood pressure  . vitamin B-12 (CYANOCOBALAMIN) 500 MCG tablet Take by mouth.    . [DISCONTINUED] gabapentin (NEURONTIN) 300 MG capsule Take 1 tab qHS for the first week, if tolerating can increase to 1 tab PO BID by week 2  . [DISCONTINUED] vitamin E 400 UNIT capsule Take 400 Units by mouth 2 (two) times daily.    Facility-Administered Encounter Medications as of 02/10/2017  Medication  . leuprolide (LUPRON) injection 7.5 mg    ALLERGIES:  No Known Allergies   PHYSICAL EXAM:  ECOG Performance status: 1 - Symptomatic; remains largely independent       Physical Exam  Constitutional: He is oriented to person, place, and time and well-developed, well-nourished, and in no distress.  Seen in chemo chair in infusion area   HENT:  Head: Normocephalic.  Mouth/Throat: Oropharynx is clear and moist.  Eyes: Conjunctivae are normal. No scleral icterus.  Neck: Normal range of motion. Neck supple.  Cardiovascular: Normal rate and regular rhythm.  Pulmonary/Chest: Effort normal and breath sounds normal. No respiratory distress.  Abdominal: Soft. Bowel sounds are normal. There is no tenderness.  Musculoskeletal: Normal range of motion. He exhibits edema (Trace ankle edema).  Lymphadenopathy:    He has no cervical adenopathy.       Right: No supraclavicular adenopathy present.       Left: No supraclavicular adenopathy present.  Neurological: He is alert and oriented to person, place, and time. No cranial nerve deficit.  Skin: Skin is warm and dry. No rash noted.  Psychiatric: Mood, memory, affect and judgment normal.  Nursing note and vitals reviewed.    LABORATORY DATA:  I have reviewed the labs as listed.  CBC    Component Value Date/Time   WBC 8.0 02/10/2017 1023   RBC 3.73 (  L) 02/10/2017 1023   HGB 11.9 (L) 02/10/2017 1023   HCT 37.2 (L) 02/10/2017 1023   PLT 198 02/10/2017 1023   MCV 99.7 02/10/2017 1023   MCH 31.9 02/10/2017 1023   MCHC 32.0 02/10/2017 1023   RDW 15.1 02/10/2017 1023   LYMPHSABS 1.0 02/10/2017 1023   MONOABS 0.5 02/10/2017 1023    EOSABS 0.1 02/10/2017 1023   BASOSABS 0.0 02/10/2017 1023   CMP Latest Ref Rng & Units 02/10/2017 01/27/2017 01/20/2017  Glucose 65 - 99 mg/dL 157(H) 125(H) 183(H)  BUN 6 - 20 mg/dL '13 14 15  ' Creatinine 0.61 - 1.24 mg/dL 0.77 0.79 0.75  Sodium 135 - 145 mmol/L 137 138 138  Potassium 3.5 - 5.1 mmol/L 4.0 4.0 3.7  Chloride 101 - 111 mmol/L 104 101 104  CO2 22 - 32 mmol/L '25 28 27  ' Calcium 8.9 - 10.3 mg/dL 9.2 9.6 9.0  Total Protein 6.5 - 8.1 g/dL 6.4(L) 6.2(L) 6.1(L)  Total Bilirubin 0.3 - 1.2 mg/dL 0.9 0.8 0.5  Alkaline Phos 38 - 126 U/L 85 147(H) 94  AST 15 - 41 U/L '25 23 25  ' ALT 17 - 63 U/L '19 25 19   ' Results for MAXWELL, LEMEN (MRN [id number])   Ref. Range 02/18/2016 09:21 03/10/2016 08:44 04/14/2016 10:33 06/23/2016 10:38 07/14/2016 09:08  PSA Latest Ref Range: 0.00 - 4.00 ng/mL 88.36 (H) 96.28 (H) 68.32 (H) 62.25 (H) 66.58 (H)    PENDING LABS:    DIAGNOSTIC IMAGING:  *The following radiologic images and reports have been reviewed independently and agree with below findings.  CT chest/abd/pelvis: 12/02/16 CLINICAL DATA:  Prostate cancer with metastatic disease to bone and ongoing chemotherapy. Diabetes.  EXAM: CT CHEST, ABDOMEN, AND PELVIS WITH CONTRAST  TECHNIQUE: Multidetector CT imaging of the chest, abdomen and pelvis was performed following the standard protocol during bolus administration of intravenous contrast.  CONTRAST:  126m ISOVUE-300 IOPAMIDOL (ISOVUE-300) INJECTION 61%  COMPARISON:  Multiple exams, including 08/21/2016  FINDINGS: CT CHEST FINDINGS  Cardiovascular: Left Port-A-Cath tip:  Upper SVC.  Coronary and aortic arch atherosclerosis.  Mild cardiomegaly.  Mediastinum/Nodes: No pathologic adenopathy in the chest identified.  Lungs/Pleura: Mild scarring anterolaterally in the right lower lobe.  Musculoskeletal: Widespread osseous metastatic disease not appreciably changed from prior. Right rib deformities from prior fractures.  CT  ABDOMEN PELVIS FINDINGS  Hepatobiliary: 2.2 cm gallstone in the gallbladder. The patient did have a right hepatic lobe posterior lesion on 10/04/2012 but this lesion is not well appreciated on CT aside from perhaps a tiny and subtle hypodensity on image 58/2.  Pancreas: Unremarkable  Spleen: Unremarkable  Adrenals/Urinary Tract: 1.6 cm right mid upper kidney cyst, stable. 1.4 by 1.2 by 1.1 cm left mid kidney hypodense lesion is somewhat less sharply defined and appears to of likely been present since 2007 and accordingly is probably a benign cyst. This was present and did not enhance on 10/04/2012.  Stomach/Bowel: Unremarkable  Vascular/Lymphatic: Aortoiliac atherosclerotic vascular disease. No pathologic adenopathy.  Reproductive: Unremarkable  Other: No supplemental non-categorized findings.  Musculoskeletal: Widespread sclerosis indicating diffuse osseous metastatic disease. Chronic bilateral pars defects at L5 with 9 mm of anterolisthesis and resulting bilateral foraminal stenosis. Lumbar spondylosis and degenerative disc disease along with congenitally short pedicles.  IMPRESSION: 1. Overall stable appearance of diffuse osseous metastatic disease and resulting patchy sclerosis. 2. The patient had a posterior right hepatic lobe lesion on prior MRI from 2014 which is been relatively occult on CT. Given the lack of progression I suspect that  this is benign or has been effectively treated. 3. Aortic Atherosclerosis (ICD10-I70.0). Coronary atherosclerosis with mild cardiomegaly. 4. Cholelithiasis. 5. Bilateral benign renal cysts. 6. 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   Electronically Signed   By: Van Clines M.D.   On: 12/02/2016 10:54    Bone scan:  12/03/16 CLINICAL DATA:  Evaluate bone metastases. History of prostate cancer.  EXAM: NUCLEAR MEDICINE WHOLE BODY BONE SCAN  TECHNIQUE: Whole body anterior and posterior images were obtained approximately 3 hours after intravenous injection of radiopharmaceutical.  RADIOPHARMACEUTICALS:  22.0 MCi Technetium-48mMDP IV  COMPARISON:  08/18/2016  FINDINGS: Multifocal areas of abnormal increased uptake involving the axial and appendicular skeleton are again noted compatible with widespread osseous metastatic disease. The number and location of abnormal uptake is unchanged from previous exam. The degree of uptake hour appears increased in the interval. Physiologic uptake is seen within both kidneys and the urinary bladder.  IMPRESSION: 1. Widespread osseous metastatic disease. Multiplicity is similar to previous exam with interval increase in degree of tracer uptake associated with multiple lesions.   Electronically Signed   By: TKerby MoorsM.D.   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 Initial PSA elevated at 123.  -Now on Jevtana + Lupron with Neulasta OnPro support; tolerating relatively well.  -Most recent CT chest/abd/pelvis on 12/02/16 revealed overall stable disease with diffuse bony mets, but without any other evidence of visceral metastatic disease. Bone scan on 12/03/16 also revealed stable disease.   -PSA trends remain elevated in the ~60 range.  Last PSA 61.42 on 12/08/16. Standing orders for PSA to be checked monthly placed today. (his port-a-cath does not reliably give blood return and he declines additional peripheral blood draw today, which is reasonable. Will check PSA in a couple of weeks when he returns for labs and  injections; he agrees with this plan).   -Due for next cycle of Jevtana today. Labs reviewed and adequate for treatment today as scheduled.  -Next restaging scans will be due in ~6 weeks (03/2017) with CT chest/abd/pelvis and bone scan; orders placed today.  -Continue Jevtana every 3 weeks.  -Return to cancer center in ~6 weeks a few days after restaging scans for follow-up visit, treatment planning, and subsequent treatment.   Bone mets:  -Continue Xgeva injections monthly to help prevent skeletal-related events.  -Encouraged continued calcium supplementation.  Peripheral neuropathy:  -Possibly multifactorial. Jevtana can cause peripheral neuropathy; patient also has underlying diabetes as well.   -Grade 2-3 at this time. Discussed with Dr. ZTalbert Cage Given his frequent falls and limited symptomatic improvement with gabapentin, we will dose-reduce his Jevtana by 25% today.  Will also increase his gabapentin to TID as well. New Rx for gabapentin e-scribed to his pharmacy today.    Neoplasm related pain:  -Continue MS Contin Q12H and Percocet for breakthrough pain. Generally only requires 1-2 doses of Percocet daily.  No refills needed today per patient.  -Reinforced the importance of adequate bowel regimen to prevent constipation while taking opiates.        Dispo:  -Continue Jevtana every 3 weeks as scheduled.  -  Check PSA monthly (standing orders placed today); patient did not want to have another lab draw today. Will collect PSA in a couple of weeks when he returns for injections.  -Continue Xgeva and Lupron injections monthly.  -Restaging CT chest/abd/pelvis and bone scan in 03/2017; orders placed today.  -Return to cancer center in ~6 weeks for follow-up visit a few days after scans with subsequent treatment.    All questions were answered to patient's stated satisfaction. Encouraged patient to call with any new concerns or questions before his next visit to the cancer center and we can  certain see him sooner, if needed.    Plan of care discussed with Dr. Talbert Cage, who agrees with the above aforementioned.    Orders placed this encounter:  Orders Placed This Encounter  Procedures  . CT Chest W Contrast  . CT Abdomen Pelvis W Contrast  . NM Bone Scan Whole Body  . PSA      Mike Craze, NP [place] [phone]

## 2017-02-10 ENCOUNTER — Encounter (HOSPITAL_BASED_OUTPATIENT_CLINIC_OR_DEPARTMENT_OTHER): Payer: Medicare Other

## 2017-02-10 ENCOUNTER — Encounter (HOSPITAL_COMMUNITY): Payer: Self-pay | Admitting: Adult Health

## 2017-02-10 ENCOUNTER — Other Ambulatory Visit: Payer: Self-pay

## 2017-02-10 ENCOUNTER — Encounter (HOSPITAL_COMMUNITY): Payer: Medicare Other | Attending: Hematology & Oncology | Admitting: Adult Health

## 2017-02-10 VITALS — BP 139/71 | HR 68 | Temp 97.6°F | Resp 18 | Wt 246.0 lb

## 2017-02-10 DIAGNOSIS — C61 Malignant neoplasm of prostate: Secondary | ICD-10-CM | POA: Diagnosis not present

## 2017-02-10 DIAGNOSIS — C7951 Secondary malignant neoplasm of bone: Secondary | ICD-10-CM | POA: Diagnosis not present

## 2017-02-10 DIAGNOSIS — T451X5A Adverse effect of antineoplastic and immunosuppressive drugs, initial encounter: Secondary | ICD-10-CM

## 2017-02-10 DIAGNOSIS — G620 Drug-induced polyneuropathy: Secondary | ICD-10-CM

## 2017-02-10 DIAGNOSIS — Z5111 Encounter for antineoplastic chemotherapy: Secondary | ICD-10-CM | POA: Diagnosis not present

## 2017-02-10 DIAGNOSIS — G893 Neoplasm related pain (acute) (chronic): Secondary | ICD-10-CM

## 2017-02-10 LAB — COMPREHENSIVE METABOLIC PANEL
ALBUMIN: 3.9 g/dL (ref 3.5–5.0)
ALT: 19 U/L (ref 17–63)
ANION GAP: 8 (ref 5–15)
AST: 25 U/L (ref 15–41)
Alkaline Phosphatase: 85 U/L (ref 38–126)
BILIRUBIN TOTAL: 0.9 mg/dL (ref 0.3–1.2)
BUN: 13 mg/dL (ref 6–20)
CHLORIDE: 104 mmol/L (ref 101–111)
CO2: 25 mmol/L (ref 22–32)
Calcium: 9.2 mg/dL (ref 8.9–10.3)
Creatinine, Ser: 0.77 mg/dL (ref 0.61–1.24)
GFR calc Af Amer: >60 mL/min (ref >60)
Glucose, Bld: 157 mg/dL — ABNORMAL HIGH (ref 65–99)
POTASSIUM: 4 mmol/L (ref 3.5–5.1)
Sodium: 137 mmol/L (ref 135–145)
TOTAL PROTEIN: 6.4 g/dL — AB (ref 6.5–8.1)

## 2017-02-10 LAB — CBC WITH DIFFERENTIAL/PLATELET
BASOS ABS: 0 10*3/uL (ref 0.0–0.1)
Basophils Relative: 0 %
Eosinophils Absolute: 0.1 10*3/uL (ref 0.0–0.7)
Eosinophils Relative: 1 %
HEMATOCRIT: 37.2 % — AB (ref 39.0–52.0)
HEMOGLOBIN: 11.9 g/dL — AB (ref 13.0–17.0)
LYMPHS ABS: 1 10*3/uL (ref 0.7–4.0)
LYMPHS PCT: 12 %
MCH: 31.9 pg (ref 26.0–34.0)
MCHC: 32 g/dL (ref 30.0–36.0)
MCV: 99.7 fL (ref 78.0–100.0)
Monocytes Absolute: 0.5 10*3/uL (ref 0.1–1.0)
Monocytes Relative: 6 %
NEUTROS ABS: 6.5 10*3/uL (ref 1.7–7.7)
Neutrophils Relative %: 81 %
PLATELETS: 198 10*3/uL (ref 150–400)
RBC: 3.73 MIL/uL — AB (ref 4.22–5.81)
RDW: 15.1 % (ref 11.5–15.5)
WBC: 8 10*3/uL (ref 4.0–10.5)

## 2017-02-10 LAB — MAGNESIUM: MAGNESIUM: 1.8 mg/dL (ref 1.7–2.4)

## 2017-02-10 MED ORDER — DIPHENHYDRAMINE HCL 50 MG/ML IJ SOLN
25.0000 mg | Freq: Once | INTRAMUSCULAR | Status: AC
  Administered 2017-02-10: 25 mg via INTRAVENOUS

## 2017-02-10 MED ORDER — SODIUM CHLORIDE 0.9% FLUSH
10.0000 mL | INTRAVENOUS | Status: DC | PRN
  Administered 2017-02-10: 10 mL
  Filled 2017-02-10: qty 10

## 2017-02-10 MED ORDER — PEGFILGRASTIM 6 MG/0.6ML ~~LOC~~ PSKT
6.0000 mg | PREFILLED_SYRINGE | Freq: Once | SUBCUTANEOUS | Status: AC
  Administered 2017-02-10: 6 mg via SUBCUTANEOUS
  Filled 2017-02-10: qty 0.6

## 2017-02-10 MED ORDER — DEXTROSE 5 % IV SOLN
15.0000 mg/m2 | Freq: Once | INTRAVENOUS | Status: AC
  Administered 2017-02-10: 36 mg via INTRAVENOUS
  Filled 2017-02-10: qty 3.6

## 2017-02-10 MED ORDER — FAMOTIDINE IN NACL 20-0.9 MG/50ML-% IV SOLN
20.0000 mg | Freq: Once | INTRAVENOUS | Status: AC
  Administered 2017-02-10: 20 mg via INTRAVENOUS

## 2017-02-10 MED ORDER — DIPHENHYDRAMINE HCL 50 MG/ML IJ SOLN
INTRAMUSCULAR | Status: AC
  Filled 2017-02-10: qty 1

## 2017-02-10 MED ORDER — SODIUM CHLORIDE 0.9 % IV SOLN
Freq: Once | INTRAVENOUS | Status: AC
  Administered 2017-02-10: 12:00:00 via INTRAVENOUS

## 2017-02-10 MED ORDER — DEXAMETHASONE SODIUM PHOSPHATE 10 MG/ML IJ SOLN
10.0000 mg | Freq: Once | INTRAMUSCULAR | Status: AC
  Administered 2017-02-10: 10 mg via INTRAVENOUS

## 2017-02-10 MED ORDER — DEXAMETHASONE SODIUM PHOSPHATE 10 MG/ML IJ SOLN
INTRAMUSCULAR | Status: AC
Start: 2017-02-10 — End: 2017-02-10
  Filled 2017-02-10: qty 1

## 2017-02-10 MED ORDER — FAMOTIDINE IN NACL 20-0.9 MG/50ML-% IV SOLN
INTRAVENOUS | Status: AC
Start: 2017-02-10 — End: 2017-02-10
  Filled 2017-02-10: qty 50

## 2017-02-10 MED ORDER — GABAPENTIN 300 MG PO CAPS: 300.0000 mg | ORAL_CAPSULE | Freq: Three times a day (TID) | ORAL | 3 refills | Status: DC

## 2017-02-10 MED ORDER — HEPARIN SOD (PORK) LOCK FLUSH 100 UNIT/ML IV SOLN
500.0000 [IU] | Freq: Once | INTRAVENOUS | Status: AC | PRN
  Administered 2017-02-10: 500 [IU]
  Filled 2017-02-10: qty 5

## 2017-02-10 NOTE — Progress Notes (Signed)
To treatment area for labs, oncology follow up, and treatment.  Stated no changes in neuropathy in feet and denied any trips, falls, or stumbles.  No problems with fingers.  Manages buttons, zippers, and jar lids.   No change in fatigue or SOB and able to perform ADL's without difficulty.  Fair appetite and complaints of bilateral knee pain.    Labs reviewed with Dr. Talbert Cage and ok to treat today.   Patient tolerated chemotherapy with no complaints voiced.  Port site clean and dry with no bruising or swelling noted at site.  Band aid applied.  Neulasta Onpro applied to right arm with green indicator light flashing.  Patient stated he felt the pinch with activation. VSS with discharge and left ambulatory with wife.  No s/s of distress noted.

## 2017-02-10 NOTE — Patient Instructions (Signed)
[facility] at [facility] Discharge Instructions  RECOMMENDATIONS MADE BY THE CONSULTANT AND ANY TEST RESULTS WILL BE SENT TO YOUR REFERRING PHYSICIAN.  You were seen today by Dr. Louise Zhou    Thank you for choosing [facility] at [facility] to provide your oncology and hematology care.  To afford each patient quality time with our provider, please arrive at least 15 minutes before your scheduled appointment time.    If you have a lab appointment with the Cancer Center please come in thru the  Main Entrance and check in at the main information desk  You need to re-schedule your appointment should you arrive 10 or more minutes late.  We strive to give you quality time with our providers, and arriving late affects you and other patients whose appointments are after yours.  Also, if you no show three or more times for appointments you may be dismissed from the clinic at the providers discretion.     Again, thank you for choosing [facility].  Our hope is that these requests will decrease the amount of time that you wait before being seen by our physicians.       _____________________________________________________________  Should you have questions after your visit to [facility], please contact our office at [phone] between the hours of 8:30 a.m. and 4:30 p.m.  Voicemails left after 4:30 p.m. will not be returned until the following business day.  For prescription refill requests, have your pharmacy contact our office.       Resources For Cancer Patients and their Caregivers ? American Cancer Society: Can assist with transportation, wigs, general needs, runs Look Good Feel Better.        1-888-227-6333 ? Cancer Care: Provides financial assistance, online support groups, medication/co-pay assistance.  1-800-813-HOPE (4673) ? [facility] Assists [place] cancer patients and their  families through emotional , educational and financial support.  336-427-4357 ? [place] DSS Where to apply for food stamps, Medicaid and utility assistance. 336-342-1394 ? [facility]: Transportation to medical appointments. 336-347-2287 ? Social Security Administration: May apply for disability if have a Stage IV cancer. 336-342-7796 1-800-772-1213 ? [place] Aging, Disability and Transit Services: Assists with nutrition, care and transit needs. 336-349-2343  Cancer Center Support Programs: @10RELATIVEDAYS@ > Cancer Support Group  2nd Tuesday of the month 1pm-2pm, Journey Room  > Creative Journey  3rd Tuesday of the month 1130am-1pm, Journey Room  > Look Good Feel Better  1st Wednesday of the month 10am-12 noon, Journey Room (Call American Cancer Society to register 1-800-395-5775)    

## 2017-02-10 NOTE — Patient Instructions (Signed)
[facility] Discharge Instructions for Patients Receiving Chemotherapy  Today you received the following chemotherapy agents Jevtana and Neulasta Onpro.    If you develop nausea and vomiting that is not controlled by your nausea medication, call the clinic.   BELOW ARE SYMPTOMS THAT SHOULD BE REPORTED IMMEDIATELY:  *FEVER GREATER THAN 100.5 F  *CHILLS WITH OR WITHOUT FEVER  NAUSEA AND VOMITING THAT IS NOT CONTROLLED WITH YOUR NAUSEA MEDICATION  *UNUSUAL SHORTNESS OF BREATH  *UNUSUAL BRUISING OR BLEEDING  TENDERNESS IN MOUTH AND THROAT WITH OR WITHOUT PRESENCE OF ULCERS  *URINARY PROBLEMS  *BOWEL PROBLEMS  UNUSUAL RASH Items with * indicate a potential emergency and should be followed up as soon as possible.  Feel free to call the clinic should you have any questions or concerns. The clinic phone number is [phone].  Please show the [place] at check-in to the Emergency Department and triage nurse.

## 2017-02-10 NOTE — Progress Notes (Signed)
Patient made aware of new directions for gabapentin three times a day and new prescription at pharmacy.  Patient verbalized understanding.

## 2017-02-26 ENCOUNTER — Encounter (HOSPITAL_COMMUNITY): Payer: Self-pay

## 2017-02-26 ENCOUNTER — Other Ambulatory Visit: Payer: Self-pay

## 2017-02-26 ENCOUNTER — Encounter (HOSPITAL_COMMUNITY): Payer: Medicare Other

## 2017-02-26 ENCOUNTER — Encounter (HOSPITAL_BASED_OUTPATIENT_CLINIC_OR_DEPARTMENT_OTHER): Payer: Medicare Other

## 2017-02-26 VITALS — BP 151/70 | HR 55 | Temp 98.3°F | Resp 20

## 2017-02-26 DIAGNOSIS — C61 Malignant neoplasm of prostate: Secondary | ICD-10-CM

## 2017-02-26 DIAGNOSIS — C7951 Secondary malignant neoplasm of bone: Secondary | ICD-10-CM | POA: Diagnosis not present

## 2017-02-26 DIAGNOSIS — Z5111 Encounter for antineoplastic chemotherapy: Secondary | ICD-10-CM

## 2017-02-26 LAB — CBC WITH DIFFERENTIAL/PLATELET
BASOS ABS: 0 10*3/uL (ref 0.0–0.1)
BASOS PCT: 1 %
Eosinophils Absolute: 0.1 10*3/uL (ref 0.0–0.7)
Eosinophils Relative: 2 %
HEMATOCRIT: 37.8 % — AB (ref 39.0–52.0)
HEMOGLOBIN: 12.3 g/dL — AB (ref 13.0–17.0)
Lymphocytes Relative: 18 %
Lymphs Abs: 1.3 10*3/uL (ref 0.7–4.0)
MCH: 31.6 pg (ref 26.0–34.0)
MCHC: 32.5 g/dL (ref 30.0–36.0)
MCV: 97.2 fL (ref 78.0–100.0)
MONOS PCT: 6 %
Monocytes Absolute: 0.4 10*3/uL (ref 0.1–1.0)
NEUTROS ABS: 5.4 10*3/uL (ref 1.7–7.7)
NEUTROS PCT: 75 %
Platelets: 203 10*3/uL (ref 150–400)
RBC: 3.89 MIL/uL — ABNORMAL LOW (ref 4.22–5.81)
RDW: 14.5 % (ref 11.5–15.5)
WBC: 7.3 10*3/uL (ref 4.0–10.5)

## 2017-02-26 LAB — COMPREHENSIVE METABOLIC PANEL
ALT: 18 U/L (ref 17–63)
AST: 26 U/L (ref 15–41)
Albumin: 3.8 g/dL (ref 3.5–5.0)
Alkaline Phosphatase: 118 U/L (ref 38–126)
Anion gap: 11 (ref 5–15)
BUN: 12 mg/dL (ref 6–20)
CHLORIDE: 103 mmol/L (ref 101–111)
CO2: 25 mmol/L (ref 22–32)
CREATININE: 0.72 mg/dL (ref 0.61–1.24)
Calcium: 9 mg/dL (ref 8.9–10.3)
GFR calc Af Amer: >60 mL/min (ref >60)
GFR calc non Af Amer: >60 mL/min (ref >60)
Glucose, Bld: 122 mg/dL — ABNORMAL HIGH (ref 65–99)
POTASSIUM: 4 mmol/L (ref 3.5–5.1)
SODIUM: 139 mmol/L (ref 135–145)
Total Bilirubin: 0.8 mg/dL (ref 0.3–1.2)
Total Protein: 6.5 g/dL (ref 6.5–8.1)

## 2017-02-26 LAB — PSA: PROSTATIC SPECIFIC ANTIGEN: 83.34 ng/mL — AB (ref 0.00–4.00)

## 2017-02-26 LAB — MAGNESIUM: Magnesium: 1.8 mg/dL (ref 1.7–2.4)

## 2017-02-26 MED ORDER — DENOSUMAB 120 MG/1.7ML ~~LOC~~ SOLN
120.0000 mg | Freq: Once | SUBCUTANEOUS | Status: AC
  Administered 2017-02-26: 120 mg via SUBCUTANEOUS
  Filled 2017-02-26: qty 1.7

## 2017-02-26 MED ORDER — LEUPROLIDE ACETATE 7.5 MG IM KIT
7.5000 mg | PACK | INTRAMUSCULAR | Status: DC
  Administered 2017-02-26: 7.5 mg via INTRAMUSCULAR
  Filled 2017-02-26: qty 7.5

## 2017-02-26 NOTE — Progress Notes (Signed)
Okay per Lorretta Harp, NP to use lab results from 02/10/17 for Xgeva administration.   Micheal Davis presents today for injection per the provider's orders.  Xgeva and administration without incident; see MAR for injection details.  Patient tolerated procedure well and without incident.  No questions or complaints noted at this time. Discharged ambulatory.

## 2017-03-04 ENCOUNTER — Encounter (HOSPITAL_COMMUNITY): Payer: Self-pay | Admitting: Hematology and Oncology

## 2017-03-04 ENCOUNTER — Other Ambulatory Visit (INDEPENDENT_AMBULATORY_CARE_PROVIDER_SITE_OTHER): Payer: Self-pay | Admitting: Otolaryngology

## 2017-03-04 ENCOUNTER — Other Ambulatory Visit: Payer: Self-pay

## 2017-03-04 ENCOUNTER — Inpatient Hospital Stay (HOSPITAL_BASED_OUTPATIENT_CLINIC_OR_DEPARTMENT_OTHER): Payer: Medicare Other | Admitting: Hematology and Oncology

## 2017-03-04 ENCOUNTER — Inpatient Hospital Stay (HOSPITAL_COMMUNITY): Payer: Medicare Other | Attending: Hematology and Oncology

## 2017-03-04 VITALS — BP 158/86 | HR 62 | Temp 97.5°F | Resp 18 | Ht 71.0 in | Wt 241.0 lb

## 2017-03-04 DIAGNOSIS — G620 Drug-induced polyneuropathy: Secondary | ICD-10-CM | POA: Diagnosis not present

## 2017-03-04 DIAGNOSIS — G629 Polyneuropathy, unspecified: Secondary | ICD-10-CM | POA: Insufficient documentation

## 2017-03-04 DIAGNOSIS — R1030 Lower abdominal pain, unspecified: Secondary | ICD-10-CM | POA: Diagnosis not present

## 2017-03-04 DIAGNOSIS — E119 Type 2 diabetes mellitus without complications: Secondary | ICD-10-CM | POA: Diagnosis not present

## 2017-03-04 DIAGNOSIS — C7951 Secondary malignant neoplasm of bone: Secondary | ICD-10-CM | POA: Diagnosis not present

## 2017-03-04 DIAGNOSIS — M549 Dorsalgia, unspecified: Secondary | ICD-10-CM

## 2017-03-04 DIAGNOSIS — C61 Malignant neoplasm of prostate: Secondary | ICD-10-CM

## 2017-03-04 DIAGNOSIS — R5383 Other fatigue: Secondary | ICD-10-CM | POA: Diagnosis not present

## 2017-03-04 DIAGNOSIS — Z5111 Encounter for antineoplastic chemotherapy: Secondary | ICD-10-CM

## 2017-03-04 DIAGNOSIS — Z923 Personal history of irradiation: Secondary | ICD-10-CM | POA: Insufficient documentation

## 2017-03-04 DIAGNOSIS — K5900 Constipation, unspecified: Secondary | ICD-10-CM

## 2017-03-04 DIAGNOSIS — M7989 Other specified soft tissue disorders: Secondary | ICD-10-CM | POA: Diagnosis not present

## 2017-03-04 DIAGNOSIS — E079 Disorder of thyroid, unspecified: Secondary | ICD-10-CM | POA: Insufficient documentation

## 2017-03-04 DIAGNOSIS — I10 Essential (primary) hypertension: Secondary | ICD-10-CM

## 2017-03-04 MED ORDER — OXYCODONE-ACETAMINOPHEN 10-325 MG PO TABS: 1.0000 | ORAL_TABLET | Freq: Four times a day (QID) | ORAL | 0 refills | Status: DC | PRN

## 2017-03-04 MED ORDER — MORPHINE SULFATE ER 30 MG PO TBCR: 30.0000 mg | EXTENDED_RELEASE_TABLET | Freq: Two times a day (BID) | ORAL | 0 refills | Status: DC

## 2017-03-04 NOTE — Progress Notes (Signed)
No tx today per Dr. Lebron Conners.  Will send pt for repeat scans per MD and proceed with plan of care after those results are obtained.

## 2017-03-11 ENCOUNTER — Encounter (HOSPITAL_COMMUNITY): Payer: Self-pay

## 2017-03-11 ENCOUNTER — Encounter (HOSPITAL_COMMUNITY)
Admission: RE | Admit: 2017-03-11 | Discharge: 2017-03-11 | Disposition: A | Discharge status: Home / Self Care | Payer: Medicare Other | Source: Ambulatory Visit | Attending: Adult Health | Admitting: Adult Health

## 2017-03-11 DIAGNOSIS — C61 Malignant neoplasm of prostate: Secondary | ICD-10-CM

## 2017-03-11 DIAGNOSIS — C7951 Secondary malignant neoplasm of bone: Secondary | ICD-10-CM | POA: Insufficient documentation

## 2017-03-11 MED ORDER — TECHNETIUM TC 99M MEDRONATE IV KIT
20.0000 | PACK | Freq: Once | INTRAVENOUS | Status: AC | PRN
  Administered 2017-03-11: 21 via INTRAVENOUS

## 2017-03-12 ENCOUNTER — Encounter (HOSPITAL_COMMUNITY): Payer: Self-pay

## 2017-03-12 ENCOUNTER — Ambulatory Visit (HOSPITAL_COMMUNITY)
Admission: RE | Admit: 2017-03-12 | Discharge: 2017-03-12 | Disposition: A | Discharge status: Home / Self Care | Payer: Medicare Other | Source: Ambulatory Visit | Attending: Hematology and Oncology | Admitting: Hematology and Oncology

## 2017-03-12 DIAGNOSIS — R918 Other nonspecific abnormal finding of lung field: Secondary | ICD-10-CM | POA: Diagnosis not present

## 2017-03-12 DIAGNOSIS — I700 Atherosclerosis of aorta: Secondary | ICD-10-CM | POA: Diagnosis not present

## 2017-03-12 DIAGNOSIS — K8020 Calculus of gallbladder without cholecystitis without obstruction: Secondary | ICD-10-CM | POA: Insufficient documentation

## 2017-03-12 DIAGNOSIS — I2510 Atherosclerotic heart disease of native coronary artery without angina pectoris: Secondary | ICD-10-CM | POA: Insufficient documentation

## 2017-03-12 DIAGNOSIS — C7951 Secondary malignant neoplasm of bone: Secondary | ICD-10-CM | POA: Insufficient documentation

## 2017-03-12 DIAGNOSIS — C61 Malignant neoplasm of prostate: Secondary | ICD-10-CM

## 2017-03-12 MED ORDER — IOPAMIDOL (ISOVUE-300) INJECTION 61%
100.0000 mL | Freq: Once | INTRAVENOUS | Status: AC | PRN
  Administered 2017-03-12: 100 mL via INTRAVENOUS

## 2017-03-13 NOTE — Assessment & Plan Note (Signed)
72 y.o. 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  Repeat PSA is up to 83.3 up from 61.4 previously.  Clinical evaluation and lab workup permissive to proceed with chemotherapy, but I would prefer to obtain restaging imaging with bone scan and CT of the chest/abdomen/pelvis prior to proceeding with additional therapy due to concern for possible disease progression that would necessitate therapy change.  Plan: -- Hold chemotherapy today -- CT of the chest/abdomen/pelvis, bone scan -- Return to clinic in 1-2 weeks after the scans to review the findings and possibly continue therapy if no radiographic progression is observed.  If patient does have progressive disease, patient can be tried on abiraterone with prednisone, but if only skeletal progression is noted, radium-223 can be considered.  If neither option is possible, and patient is willing, we may consider mitoxantrone therapy.

## 2017-03-13 NOTE — Progress Notes (Signed)
[facility] Follow-up Visit:  Assessment: Prostate cancer metastatic to bone [facility]) 72 y.o. 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  Repeat PSA is up to 83.3 up from 61.4 previously.  Clinical evaluation and lab workup permissive to proceed with chemotherapy, but I would prefer to obtain restaging imaging with bone scan and CT of the chest/abdomen/pelvis prior to proceeding with additional therapy due to concern for possible disease progression that would necessitate therapy change.  Plan: -- Hold chemotherapy today -- CT of the chest/abdomen/pelvis, bone scan -- Return to clinic in 1-2 weeks after the scans to review the findings and possibly continue therapy if no radiographic progression is observed.  If patient does have progressive disease, patient can be tried on abiraterone with prednisone, but if only skeletal progression is noted, radium-223 can be considered.  If neither option is possible, and patient is willing, we may consider mitoxantrone therapy.  Voice recognition software was used and creation of this note. Despite my best effort at editing the text, some misspelling/errors may have occurred.  Orders Placed This Encounter  Procedures  . CT Abdomen Pelvis W Contrast    Standing Status:   Future    Number of Occurrences:   1    Standing Expiration Date:   03/04/2018    Order Specific Question:   If indicated for the ordered procedure, I authorize the administration of contrast media per Radiology protocol    Answer:   Yes    Order Specific Question:   Preferred imaging location?    Answer:   [facility]    Order Specific Question:   Radiology Contrast Protocol - do NOT remove file path    Answer:   [email or web address]    Order Specific Question:   Reason for Exam additional comments    Answer:   Met prostate cancer, please eval for progression  . CT Chest W  Contrast    Standing Status:   Future    Number of Occurrences:   1    Standing Expiration Date:   03/04/2018    Order Specific Question:   If indicated for the ordered procedure, I authorize the administration of contrast media per Radiology protocol    Answer:   Yes    Order Specific Question:   Preferred imaging location?    Answer:   [facility]    Order Specific Question:   Radiology Contrast Protocol - do NOT remove file path    Answer:   [email or web address]    Order Specific Question:   Reason for Exam additional comments    Answer:   Met prostate cancer, please eval for progression  . CBC with Differential    Standing Status:   Future    Standing Expiration Date:   03/04/2018  . Comprehensive metabolic panel    Standing Status:   Future    Standing Expiration Date:   03/04/2018  . Phosphorus    Standing Status:   Future    Standing Expiration Date:   03/04/2018  . Magnesium    Standing Status:   Future    Standing Expiration Date:   03/04/2018    Cancer Staging Prostate cancer metastatic to bone [facility]) Staging form: Prostate, AJCC 7th Edition - Clinical: No stage assigned - Unsigned   All questions were answered.  . The patient knows to call the clinic with any problems, questions or concerns.  This note was electronically signed.  History of Presenting Illness Micheal Davis Davis 72 y.o. presenting to the [place] for Diagnosis of metastatic, castrate refractory prostate cancer.  Currently patient is undergoing therapy with monthly Lupron and cabazitaxel with Neulasta support.  Patient returns to the clinic for consideration for possible cycle #20.  In the interim, patient continues to complain of peripheral neuropathy without significant change, he also complains of generalized weakness, fatigue, constipation, and swelling in lower extremities.  In addition to that, patient has chronic back pain and has some pain in the groin areas as  well.  No interval fever, chills, night sweats.  Oncological/hematological History:   Prostate cancer metastatic to bone ([place])   10/01/2012 Initial Diagnosis    Prostate biopsied with highest Gleason score of 9 seen and the lowest score was 7.      10/04/2012 - 05/16/2013 Chemotherapy    Depo-Lupron and Casodex initiated      05/16/2013 -  Chemotherapy    Depo-Lupron monthly continued      05/16/2013 Progression    Progression by PSA elevation      05/16/2013 - 10/22/2014 Chemotherapy    Abiraterone and prednisone initiated in conjunction with ongoing Depo-Lupron.  Denosumab also ongoing.      10/23/2014 Progression    PSA increasing from 0.2- 1.6 in less than 6 months.        10/23/2014 - 01/30/2015 Chemotherapy    Enzalutamide and Prednisone (5 mg in AM and 2.5 mg in PM)      01/30/2015 Imaging    Bone scan- New focus of intense activity in right proximal humerus.  Interim increase in activity over left hip.      01/30/2015 Progression    Bone scan reveals new disease in right humerus consistent with progression of disease      01/31/2015 Imaging    Right humerus xray- blastic foci in proximal right humeral metaphysis and over right mid-humeral diaphysis.  No evidence of fracture      07/06/2015 Progression    Progression in multiple bones especially L hip and femurs      07/06/2015 Imaging    Bone scan- progressive multifocal osseous metastases in the right proximal femora and distal femoral shafts.  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 Tammi Klippel      11/23/2015 - 01/04/2016 Chemotherapy    The patient had pegfilgrastim (NEULASTA ONPRO KIT) injection 6 mg, 6 mg, Subcutaneous, Once, 3 of 7 cycles  DOCEtaxel (TAXOTERE) 180 mg in dextrose 5 % 250 mL chemo infusion, 75 mg/m2 = 180 mg, Intravenous,  Once, 3 of 7 cycles Dose modification: 64 mg/m2 (original  dose 75 mg/m2, Cycle 2, Reason: Dose not tolerated)  pegfilgrastim (NEULASTA ONPRO KIT) injection 6 mg, 6 mg, Subcutaneous, Once, 0 of 4 cycles  cabazitaxel (JEVTANA) 60 mg in dextrose 5 % 250 mL chemo infusion, 25 mg/m2, Intravenous,  Once, 0 of 4 cycles  for chemotherapy treatment.        11/30/2015 Adverse Reaction    Diarrhea (secondary to chemotherapy) and dehydration requiring IV fluids      12/14/2015 Treatment Plan Change    Docetaxel dose reduced by 15%      12/31/2015 Procedure    Port placed by Dr. Arnoldo Morale      01/28/2016 -  Chemotherapy    Cabazitaxel (Jevtana)       03/27/2016 Imaging  CT Chest, Abdomen, and Pelvis with contrast 1. Diffuse sclerotic osseous metastatic disease in the chest, abdomen, and pelvis without acute fracture identified. 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 These appear chronic. 2. The prostate gland is normal in size and no adenopathy is currently identified. 3. On a prior MRI of 10/04/2012, there was a posterior right hepatic lobe lesion. This lesion is not currently visible on today' s CT. This could be due to differences in cons acuity between CT or MRI, or resolution of the lesion. 4. Coronary, aortic arch, and branch vessel atherosclerotic vascular disease. Aortoiliac atherosclerotic vascular disease. 5. Single bilateral renal cysts.      04/16/2016 Imaging    Bone scan- Findings consist with progressive metastatic disease. Activity over the proximal right humerus and proximal bilateral femurs are worrisome for the possible development of pathologic fractures.      08/21/2016 Imaging    CT C/A/P: IMPRESSION: No significant change since 03/27/2016 CT. Diffuse bony metastases without other evidence of metastatic disease.      08/21/2016 Imaging    Bone Scan: IMPRESSION: Multiple areas of increased activity again noted throughout the axial and appendicular skeleton in similar locations  as prior exam. Intensity of uptake is increased from prior exam suggesting progressive disease. Lesions present in the proximal humeri, proximal femurs, and the mid right femur susceptible to pathologic fracture.      12/02/2016 Imaging    CT C/A/P: IMPRESSION: 1. Overall stable appearance of diffuse osseous metastatic disease and resulting patchy sclerosis. 2. The patient had a posterior right hepatic lobe lesion on prior MRI from 2014 which is been relatively occult on CT. Given the lack of progression I suspect that this is benign or has been effectively treated. 3. Aortic Atherosclerosis (ICD10-I70.0). Coronary atherosclerosis with mild cardiomegaly. 4. Cholelithiasis. 5. Bilateral benign renal cysts. 6. 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      12/02/2016 Imaging    Bone Scan: IMPRESSION: 1. Widespread osseous metastatic disease. Multiplicity is similar to previous exam with interval increase in degree of tracer uptake associated with multiple lesions.       Medical History: Past Medical History:  Diagnosis Date  . Diabetes mellitus without complication ([place])   . Hypertension   . Prostate cancer ([place]) 09/05/2015  . Prostate cancer metastatic to bone ([place]) 09/05/2015  . Sleep apnea   . Thyroid disease     Surgical History: Past Surgical History:  Procedure Laterality Date  . CATARACT EXTRACTION    . PORTACATH PLACEMENT Left 12/31/2015   Procedure: INSERTION PORT-A-CATH LEFT SUBCLAVIAN;  Surgeon: Aviva Signs, MD;  Location: [facility];  Service: General;  Laterality: Left;  . REPLACEMENT TOTAL KNEE Left     Family History: History reviewed. No pertinent family history.  Social History: Social History   Socioeconomic History  . Marital status: Married    Spouse name: Not on file  .  Number of children: Not on file  . Years of education: Not on file  . Highest education level: Not on file  Social Needs  . Financial resource strain: Not on file  . Food insecurity - worry: Not on file  . Food insecurity - inability: Not on file  . Transportation needs - medical: Not on file  . Transportation needs - non-medical: Not on file  Occupational History  .  Not on file  Tobacco Use  . Smoking status: Never Smoker  . Smokeless tobacco: Never Used  Substance and Sexual Activity  . Alcohol use: Yes    Comment: 1 beer each month  . Drug use: No  . Sexual activity: No    Comment: married  Other Topics Concern  . Not on file  Social History Narrative  . Not on file    Allergies: No Known Allergies  Medications:  Current Outpatient Medications  Medication Sig Dispense Refill  . atenolol (TENORMIN) 100 MG tablet Take 100 mg by mouth daily.    Marland Kitchen atorvastatin (LIPITOR) 20 MG tablet Take 10 mg by mouth daily.    . Cabazitaxel (JEVTANA IV) Inject into the vein. Every 3 weeks    . calcium carbonate (OS-CAL - DOSED IN MG OF ELEMENTAL CALCIUM) 1250 (500 Ca) MG tablet Take 2 tablets by mouth daily with breakfast.     . diltiazem (CARDIZEM CD) 300 MG 24 hr capsule Take 300 mg by mouth daily.    Marland Kitchen esomeprazole (NEXIUM) 20 MG capsule Take 1 capsule (20 mg total) by mouth daily at 12 noon. 30 capsule 5  . gabapentin (NEURONTIN) 300 MG capsule Take 1 capsule (300 mg total) by mouth 3 (three) times daily. 90 capsule 3  . Glucosamine-Chondroit-Vit C-Mn (GLUCOSAMINE 1500 COMPLEX PO) Take 1 tablet by mouth 2 (two) times daily.     Marland Kitchen KLOR-CON M20 20 MEQ tablet TAKE 1 TABLET BY MOUTH ONCE DAILY 90 tablet 1  . levothyroxine (SYNTHROID, LEVOTHROID) 25 MCG tablet Take 25 mcg by mouth daily before breakfast.    . lidocaine-prilocaine (EMLA) cream Apply to affected area once 30 g 3  . loperamide (IMODIUM) 1 MG/5ML solution Take 1 mg by mouth as needed for diarrhea or loose stools.    . magic  mouthwash w/lidocaine SOLN 1 part of each of the following: Benadryl 12.77m /569m Viscous lidocaine 2%, Maalox. Swish and swallow 5 mL QID. 240 mL 1  . metFORMIN (GLUCOPHAGE) 500 MG tablet Take 500 mg by mouth 2 (two) times daily with a meal.    . morphine (MS CONTIN) 30 MG 12 hr tablet Take 1 tablet (30 mg total) by mouth every 12 (twelve) hours. 60 tablet 0  . Multiple Vitamin (MULTIVITAMIN WITH MINERALS) TABS tablet Take 1 tablet by mouth daily.    . Omega-3 Fatty Acids (FISH OIL) 1000 MG CAPS Take 1 capsule by mouth daily.    . ondansetron (ZOFRAN) 8 MG tablet Take 1 tablet (8 mg total) by mouth 2 (two) times daily as needed (Nausea or vomiting). 30 tablet 1  . oxyCODONE-acetaminophen (PERCOCET) 10-325 MG tablet Take 1 tablet by mouth every 6 (six) hours as needed for pain. 90 tablet 0  . Pegfilgrastim (NEULASTA ONPRO [place]) Inject into the skin. Every 21 days    . predniSONE (DELTASONE) 5 MG tablet TAKE 1 TABLET BY MOUTH TWICE DAILY WITH  A  MEAL 180 tablet 0  . prochlorperazine (COMPAZINE) 10 MG tablet Take 1 tablet (10 mg total) by mouth every 6 (six) hours as needed (Nausea or vomiting). 30 tablet 1  . tamsulosin (FLOMAX) 0.4 MG CAPS capsule Take 0.8 mg by mouth daily.     . Marland Kitchenriamterene-hydrochlorothiazide (MAXZIDE-25) 37.5-25 MG tablet Take 1 tablet by mouth daily. for high blood pressure  3  . vitamin B-12 (CYANOCOBALAMIN) 500 MCG tablet Take by mouth.     No current facility-administered medications for this visit.    Facility-Administered Medications Ordered in  Other Visits  Medication Dose Route Frequency Provider Last Rate Last Dose  . leuprolide (LUPRON) injection 7.5 mg  7.5 mg Intramuscular Q28 days [facility], PA-C        Review of Systems: Review of Systems  Constitutional: Positive for fatigue.  Cardiovascular: Positive for leg swelling.  Gastrointestinal: Positive for constipation.  Genitourinary: Positive for pelvic pain.   Musculoskeletal: Positive for back pain.   Neurological: Positive for numbness.  Hematological: Bruises/bleeds easily.  All other systems reviewed and are negative.    PHYSICAL EXAMINATION Blood pressure (!) 158/86, pulse 62, temperature (!) 97.5 F (36.4 C), temperature source Oral, resp. rate 18, height 5' 11" (1.803 m), weight 241 lb (109.3 kg), SpO2 98 %.  ECOG PERFORMANCE STATUS: 2 - Symptomatic, <50% confined to bed  Physical Exam  Constitutional: He is oriented to person, place, and time and well-developed, well-nourished, and in no distress. No distress.  HENT:  Head: Normocephalic and atraumatic.  Mouth/Throat: Oropharynx is clear and moist. No oropharyngeal exudate.  Eyes: Conjunctivae and EOM are normal. Pupils are equal, round, and reactive to light. No scleral icterus.  Neck: No thyromegaly present.  Cardiovascular: Normal rate, regular rhythm and normal heart sounds.  No murmur heard. Pulmonary/Chest: Effort normal and breath sounds normal. No respiratory distress. He has no wheezes. He has no rales.  Abdominal: Soft. Bowel sounds are normal. He exhibits no distension. There is no tenderness. There is no rebound.  Musculoskeletal: He exhibits no edema.  Lymphadenopathy:    He has no cervical adenopathy.  Neurological: He is alert and oriented to person, place, and time. He has normal reflexes. No cranial nerve deficit.  Skin: Skin is warm and dry. No rash noted. He is not diaphoretic. No erythema.     LABORATORY DATA: I have personally reviewed the data as listed: Appointment on 02/26/2017  Component Date Value Ref Range Status  . WBC 02/26/2017 7.3  4.0 - 10.5 K/uL Final  . RBC 02/26/2017 3.89* 4.22 - 5.81 MIL/uL Final  . Hemoglobin 02/26/2017 12.3* 13.0 - 17.0 g/dL Final  . HCT 02/26/2017 37.8* 39.0 - 52.0 % Final  . MCV 02/26/2017 97.2  78.0 - 100.0 fL Final  . MCH 02/26/2017 31.6  26.0 - 34.0 pg Final  . MCHC 02/26/2017 32.5  30.0 - 36.0 g/dL Final  . RDW 02/26/2017 14.5  11.5 - 15.5 % Final  .  Platelets 02/26/2017 203  150 - 400 K/uL Final  . Neutrophils Relative % 02/26/2017 75  % Final  . Neutro Abs 02/26/2017 5.4  1.7 - 7.7 K/uL Final  . Lymphocytes Relative 02/26/2017 18  % Final  . Lymphs Abs 02/26/2017 1.3  0.7 - 4.0 K/uL Final  . Monocytes Relative 02/26/2017 6  % Final  . Monocytes Absolute 02/26/2017 0.4  0.1 - 1.0 K/uL Final  . Eosinophils Relative 02/26/2017 2  % Final  . Eosinophils Absolute 02/26/2017 0.1  0.0 - 0.7 K/uL Final  . Basophils Relative 02/26/2017 1  % Final  . Basophils Absolute 02/26/2017 0.0  0.0 - 0.1 K/uL Final  . Sodium 02/26/2017 139  135 - 145 mmol/L Final  . Potassium 02/26/2017 4.0  3.5 - 5.1 mmol/L Final  . Chloride 02/26/2017 103  101 - 111 mmol/L Final  . CO2 02/26/2017 25  22 - 32 mmol/L Final  . Glucose, Bld 02/26/2017 122* 65 - 99 mg/dL Final  . BUN 02/26/2017 12  6 - 20 mg/dL Final  . Creatinine, Ser 02/26/2017 0.72  0.61 - 1.24 mg/dL Final  . Calcium 02/26/2017 9.0  8.9 - 10.3 mg/dL Final  . Total Protein 02/26/2017 6.5  6.5 - 8.1 g/dL Final  . Albumin 02/26/2017 3.8  3.5 - 5.0 g/dL Final  . AST 02/26/2017 26  15 - 41 U/L Final  . ALT 02/26/2017 18  17 - 63 U/L Final  . Alkaline Phosphatase 02/26/2017 118  38 - 126 U/L Final  . Total Bilirubin 02/26/2017 0.8  0.3 - 1.2 mg/dL Final  . GFR calc non Af Amer 02/26/2017 >60  >60 mL/min Final  . GFR calc Af Amer 02/26/2017 >60  >60 mL/min Final   Comment: (NOTE) The eGFR has been calculated using the CKD EPI equation. This calculation has not been validated in all clinical situations. eGFR's persistently <60 mL/min signify possible Chronic Kidney Disease.   . Anion gap 02/26/2017 11  5 - 15 Final  . Magnesium 02/26/2017 1.8  1.7 - 2.4 mg/dL Final  . Prostatic Specific Antigen 02/26/2017 83.34* 0.00 - 4.00 ng/mL Final   Comment: (NOTE) While PSA levels of <=4.0 ng/ml are reported as reference range, some men with levels below 4.0 ng/ml can have prostate cancer and many men with PSA  above 4.0 ng/ml do not have prostate cancer.  Other tests such as free PSA, age specific reference ranges, PSA velocity and PSA doubling time may be helpful especially in men less than 44 years old. Performed at [facility], [place] [address] [place], [place] [address]        Ardath Sax, MD

## 2017-03-16 DIAGNOSIS — G4733 Obstructive sleep apnea (adult) (pediatric): Secondary | ICD-10-CM | POA: Diagnosis not present

## 2017-03-16 DIAGNOSIS — C61 Malignant neoplasm of prostate: Secondary | ICD-10-CM | POA: Diagnosis not present

## 2017-03-16 DIAGNOSIS — Z6832 Body mass index (BMI) 32.0-32.9, adult: Secondary | ICD-10-CM | POA: Diagnosis not present

## 2017-03-16 DIAGNOSIS — M545 Low back pain: Secondary | ICD-10-CM | POA: Diagnosis not present

## 2017-03-16 DIAGNOSIS — M5090 Cervical disc disorder, unspecified, unspecified cervical region: Secondary | ICD-10-CM | POA: Diagnosis not present

## 2017-03-18 ENCOUNTER — Ambulatory Visit (HOSPITAL_COMMUNITY): Payer: 59 | Admitting: Hematology and Oncology

## 2017-03-18 ENCOUNTER — Inpatient Hospital Stay (HOSPITAL_BASED_OUTPATIENT_CLINIC_OR_DEPARTMENT_OTHER): Payer: Medicare Other | Admitting: Hematology and Oncology

## 2017-03-18 ENCOUNTER — Other Ambulatory Visit: Payer: Self-pay

## 2017-03-18 ENCOUNTER — Inpatient Hospital Stay (HOSPITAL_COMMUNITY): Payer: Medicare Other

## 2017-03-18 ENCOUNTER — Other Ambulatory Visit (HOSPITAL_COMMUNITY): Payer: 59

## 2017-03-18 VITALS — BP 141/85 | HR 51 | Temp 98.0°F | Resp 20 | Wt 232.0 lb

## 2017-03-18 DIAGNOSIS — C7951 Secondary malignant neoplasm of bone: Principal | ICD-10-CM

## 2017-03-18 DIAGNOSIS — E079 Disorder of thyroid, unspecified: Secondary | ICD-10-CM | POA: Diagnosis not present

## 2017-03-18 DIAGNOSIS — G629 Polyneuropathy, unspecified: Secondary | ICD-10-CM | POA: Diagnosis not present

## 2017-03-18 DIAGNOSIS — Z5111 Encounter for antineoplastic chemotherapy: Secondary | ICD-10-CM | POA: Diagnosis not present

## 2017-03-18 DIAGNOSIS — Z923 Personal history of irradiation: Secondary | ICD-10-CM

## 2017-03-18 DIAGNOSIS — C61 Malignant neoplasm of prostate: Secondary | ICD-10-CM

## 2017-03-18 DIAGNOSIS — R5383 Other fatigue: Secondary | ICD-10-CM | POA: Diagnosis not present

## 2017-03-18 LAB — COMPREHENSIVE METABOLIC PANEL
ALK PHOS: 120 U/L (ref 38–126)
ALT: 23 U/L (ref 17–63)
ANION GAP: 15 (ref 5–15)
AST: 36 U/L (ref 15–41)
Albumin: 3.5 g/dL (ref 3.5–5.0)
BUN: 23 mg/dL — ABNORMAL HIGH (ref 6–20)
CALCIUM: 9.6 mg/dL (ref 8.9–10.3)
CO2: 24 mmol/L (ref 22–32)
CREATININE: 0.92 mg/dL (ref 0.61–1.24)
Chloride: 98 mmol/L — ABNORMAL LOW (ref 101–111)
Glucose, Bld: 192 mg/dL — ABNORMAL HIGH (ref 65–99)
Potassium: 4 mmol/L (ref 3.5–5.1)
Sodium: 137 mmol/L (ref 135–145)
TOTAL PROTEIN: 6.8 g/dL (ref 6.5–8.1)
Total Bilirubin: 0.5 mg/dL (ref 0.3–1.2)

## 2017-03-18 LAB — CBC WITH DIFFERENTIAL/PLATELET
Basophils Absolute: 0 10*3/uL (ref 0.0–0.1)
Basophils Relative: 0 %
EOS PCT: 0 %
Eosinophils Absolute: 0 10*3/uL (ref 0.0–0.7)
HCT: 36.3 % — ABNORMAL LOW (ref 39.0–52.0)
HEMOGLOBIN: 11.8 g/dL — AB (ref 13.0–17.0)
LYMPHS ABS: 0.7 10*3/uL (ref 0.7–4.0)
LYMPHS PCT: 9 %
MCH: 30.5 pg (ref 26.0–34.0)
MCHC: 32.5 g/dL (ref 30.0–36.0)
MCV: 93.8 fL (ref 78.0–100.0)
MONOS PCT: 6 %
Monocytes Absolute: 0.5 10*3/uL (ref 0.1–1.0)
Neutro Abs: 7 10*3/uL (ref 1.7–7.7)
Neutrophils Relative %: 85 %
Platelets: 327 10*3/uL (ref 150–400)
RBC: 3.87 MIL/uL — AB (ref 4.22–5.81)
RDW: 13.2 % (ref 11.5–15.5)
WBC: 8.3 10*3/uL (ref 4.0–10.5)

## 2017-03-18 LAB — PHOSPHORUS: PHOSPHORUS: 4.4 mg/dL (ref 2.5–4.6)

## 2017-03-18 LAB — PSA: Prostatic Specific Antigen: 148 ng/mL — ABNORMAL HIGH (ref 0.00–4.00)

## 2017-03-18 LAB — MAGNESIUM: Magnesium: 1.8 mg/dL (ref 1.7–2.4)

## 2017-03-18 MED ORDER — PEGFILGRASTIM 6 MG/0.6ML ~~LOC~~ PSKT
6.0000 mg | PREFILLED_SYRINGE | Freq: Once | SUBCUTANEOUS | Status: AC
  Administered 2017-03-18: 6 mg via SUBCUTANEOUS
  Filled 2017-03-18: qty 0.6

## 2017-03-18 MED ORDER — FAMOTIDINE IN NACL 20-0.9 MG/50ML-% IV SOLN
INTRAVENOUS | Status: AC
  Filled 2017-03-18: qty 50

## 2017-03-18 MED ORDER — DEXAMETHASONE SODIUM PHOSPHATE 10 MG/ML IJ SOLN
INTRAMUSCULAR | Status: AC
Start: 2017-03-18 — End: 2017-03-18
  Filled 2017-03-18: qty 1

## 2017-03-18 MED ORDER — SENNOSIDES-DOCUSATE SODIUM 8.6-50 MG PO TABS: 2.0000 | ORAL_TABLET | Freq: Two times a day (BID) | ORAL | 3 refills | Status: DC

## 2017-03-18 MED ORDER — HEPARIN SOD (PORK) LOCK FLUSH 100 UNIT/ML IV SOLN
500.0000 [IU] | Freq: Once | INTRAVENOUS | Status: AC | PRN
  Administered 2017-03-18: 500 [IU]
  Filled 2017-03-18: qty 5

## 2017-03-18 MED ORDER — DEXAMETHASONE SODIUM PHOSPHATE 10 MG/ML IJ SOLN
10.0000 mg | Freq: Once | INTRAMUSCULAR | Status: AC
  Administered 2017-03-18: 10 mg via INTRAVENOUS

## 2017-03-18 MED ORDER — MORPHINE SULFATE 30 MG PO TABS: 30.0000 mg | ORAL_TABLET | ORAL | 0 refills | Status: DC | PRN

## 2017-03-18 MED ORDER — DEXTROSE 5 % IV SOLN
15.0000 mg/m2 | Freq: Once | INTRAVENOUS | Status: AC
  Administered 2017-03-18: 36 mg via INTRAVENOUS
  Filled 2017-03-18: qty 3.6

## 2017-03-18 MED ORDER — SODIUM CHLORIDE 0.9 % IV SOLN
Freq: Once | INTRAVENOUS | Status: AC
  Administered 2017-03-18: 12:00:00 via INTRAVENOUS

## 2017-03-18 MED ORDER — MORPHINE SULFATE ER 60 MG PO TBCR: 60.0000 mg | EXTENDED_RELEASE_TABLET | Freq: Two times a day (BID) | ORAL | 0 refills | Status: DC

## 2017-03-18 MED ORDER — PEGFILGRASTIM 6 MG/0.6ML ~~LOC~~ PSKT
PREFILLED_SYRINGE | SUBCUTANEOUS | Status: AC
  Filled 2017-03-18: qty 0.6

## 2017-03-18 MED ORDER — POLYETHYLENE GLYCOL 3350 17 G PO PACK: 34.0000 g | PACK | Freq: Every day | ORAL | 3 refills | Status: AC | PRN

## 2017-03-18 MED ORDER — DIPHENHYDRAMINE HCL 50 MG/ML IJ SOLN
25.0000 mg | Freq: Once | INTRAMUSCULAR | Status: AC
  Administered 2017-03-18: 25 mg via INTRAVENOUS

## 2017-03-18 MED ORDER — DIPHENHYDRAMINE HCL 50 MG/ML IJ SOLN
INTRAMUSCULAR | Status: AC
  Filled 2017-03-18: qty 1

## 2017-03-18 MED ORDER — SODIUM CHLORIDE 0.9% FLUSH
10.0000 mL | INTRAVENOUS | Status: DC | PRN
  Administered 2017-03-18: 10 mL
  Filled 2017-03-18: qty 10

## 2017-03-18 MED ORDER — FAMOTIDINE IN NACL 20-0.9 MG/50ML-% IV SOLN
20.0000 mg | Freq: Once | INTRAVENOUS | Status: AC
  Administered 2017-03-18: 20 mg via INTRAVENOUS

## 2017-03-18 NOTE — Progress Notes (Signed)
Labs reviewed by MD. Proceed with treatment.  Marland KitchenCristy Davis arrived today for [facility] neulasta on body injector. See MAR for administration details. Injector in place and engaged with green light indicator on flashing. Tolerated application with out problems.  Treatment given per orders. Patient tolerated it well without problems. Vitals stable and discharged home from clinic ambulatory. Follow up as scheduled.

## 2017-03-19 ENCOUNTER — Other Ambulatory Visit: Payer: Self-pay

## 2017-03-19 ENCOUNTER — Encounter (HOSPITAL_COMMUNITY): Payer: Self-pay

## 2017-03-19 NOTE — Patient Instructions (Signed)
[facility] Discharge Instructions for Patients Receiving Chemotherapy   Beginning January 23rd 2017 lab work for the Cancer Center will be done in the  Main lab at [facility] on 1st floor. If you have a lab appointment with the Cancer Center please come in thru the  Main Entrance and check in at the main information desk   Today you received the following chemotherapy agents   To help prevent nausea and vomiting after your treatment, we encourage you to take your nausea medication     If you develop nausea and vomiting, or diarrhea that is not controlled by your medication, call the clinic.  The clinic phone number is [phone]. Office hours are Monday-Friday 8:30am-5:00pm.  BELOW ARE SYMPTOMS THAT SHOULD BE REPORTED IMMEDIATELY:  *FEVER GREATER THAN 101.0 F  *CHILLS WITH OR WITHOUT FEVER  NAUSEA AND VOMITING THAT IS NOT CONTROLLED WITH YOUR NAUSEA MEDICATION  *UNUSUAL SHORTNESS OF BREATH  *UNUSUAL BRUISING OR BLEEDING  TENDERNESS IN MOUTH AND THROAT WITH OR WITHOUT PRESENCE OF ULCERS  *URINARY PROBLEMS  *BOWEL PROBLEMS  UNUSUAL RASH Items with * indicate a potential emergency and should be followed up as soon as possible. If you have an emergency after office hours please contact your primary care physician or go to the nearest emergency department.  Please call the clinic during office hours if you have any questions or concerns.   You may also contact the Patient Navigator at [phone] should you have any questions or need assistance in obtaining follow up care.      Resources For Cancer Patients and their Caregivers ? American Cancer Society: Can assist with transportation, wigs, general needs, runs Look Good Feel Better.        1-888-227-6333 ? Cancer Care: Provides financial assistance, online support groups, medication/co-pay assistance.  1-800-813-HOPE (4673) ? [facility] Assists [place] cancer  patients and their families through emotional , educational and financial support.  336-427-4357 ? [place] DSS Where to apply for food stamps, Medicaid and utility assistance. 336-342-1394 ? RCATS: Transportation to medical appointments. 336-347-2287 ? Social Security Administration: May apply for disability if have a Stage IV cancer. 336-342-7796 1-800-772-1213 ? [place] Aging, Disability and Transit Services: Assists with nutrition, care and transit needs. 336-349-2343         

## 2017-03-20 ENCOUNTER — Other Ambulatory Visit (HOSPITAL_COMMUNITY): Payer: 59

## 2017-03-20 ENCOUNTER — Other Ambulatory Visit (HOSPITAL_COMMUNITY): Payer: Self-pay | Admitting: Adult Health

## 2017-03-23 ENCOUNTER — Ambulatory Visit (HOSPITAL_COMMUNITY): Payer: 59

## 2017-03-25 ENCOUNTER — Ambulatory Visit (HOSPITAL_COMMUNITY): Payer: 59

## 2017-03-25 ENCOUNTER — Ambulatory Visit (HOSPITAL_COMMUNITY): Payer: 59 | Admitting: Internal Medicine

## 2017-03-26 ENCOUNTER — Inpatient Hospital Stay (HOSPITAL_COMMUNITY): Payer: Medicare Other

## 2017-03-26 ENCOUNTER — Encounter (HOSPITAL_COMMUNITY): Payer: Self-pay

## 2017-03-26 VITALS — BP 155/74 | HR 63 | Temp 98.2°F | Resp 20 | Wt 232.3 lb

## 2017-03-26 DIAGNOSIS — C61 Malignant neoplasm of prostate: Secondary | ICD-10-CM | POA: Diagnosis not present

## 2017-03-26 DIAGNOSIS — G629 Polyneuropathy, unspecified: Secondary | ICD-10-CM | POA: Diagnosis not present

## 2017-03-26 DIAGNOSIS — C7951 Secondary malignant neoplasm of bone: Secondary | ICD-10-CM

## 2017-03-26 DIAGNOSIS — Z5111 Encounter for antineoplastic chemotherapy: Secondary | ICD-10-CM | POA: Diagnosis not present

## 2017-03-26 DIAGNOSIS — E079 Disorder of thyroid, unspecified: Secondary | ICD-10-CM | POA: Diagnosis not present

## 2017-03-26 DIAGNOSIS — Z923 Personal history of irradiation: Secondary | ICD-10-CM | POA: Diagnosis not present

## 2017-03-26 LAB — CBC WITH DIFFERENTIAL/PLATELET
BASOS PCT: 0 %
Basophils Absolute: 0 10*3/uL (ref 0.0–0.1)
EOS ABS: 0.2 10*3/uL (ref 0.0–0.7)
Eosinophils Relative: 1 %
HCT: 39.1 % (ref 39.0–52.0)
Hemoglobin: 12.6 g/dL — ABNORMAL LOW (ref 13.0–17.0)
Lymphocytes Relative: 8 %
Lymphs Abs: 1.7 10*3/uL (ref 0.7–4.0)
MCH: 30.5 pg (ref 26.0–34.0)
MCHC: 32.2 g/dL (ref 30.0–36.0)
MCV: 94.7 fL (ref 78.0–100.0)
MONO ABS: 0.8 10*3/uL (ref 0.1–1.0)
Monocytes Relative: 4 %
NEUTROS ABS: 18.2 10*3/uL — AB (ref 1.7–7.7)
NEUTROS PCT: 87 %
PLATELETS: 237 10*3/uL (ref 150–400)
RBC: 4.13 MIL/uL — ABNORMAL LOW (ref 4.22–5.81)
RDW: 14.1 % (ref 11.5–15.5)
WBC: 20.9 10*3/uL — ABNORMAL HIGH (ref 4.0–10.5)

## 2017-03-26 LAB — COMPREHENSIVE METABOLIC PANEL
ALK PHOS: 172 U/L — AB (ref 38–126)
ALT: 19 U/L (ref 17–63)
AST: 24 U/L (ref 15–41)
Albumin: 4 g/dL (ref 3.5–5.0)
Anion gap: 14 (ref 5–15)
BUN: 21 mg/dL — ABNORMAL HIGH (ref 6–20)
CALCIUM: 9.6 mg/dL (ref 8.9–10.3)
CHLORIDE: 98 mmol/L — AB (ref 101–111)
CO2: 24 mmol/L (ref 22–32)
CREATININE: 0.91 mg/dL (ref 0.61–1.24)
Glucose, Bld: 144 mg/dL — ABNORMAL HIGH (ref 65–99)
Potassium: 4 mmol/L (ref 3.5–5.1)
Sodium: 136 mmol/L (ref 135–145)
Total Bilirubin: 0.8 mg/dL (ref 0.3–1.2)
Total Protein: 6.7 g/dL (ref 6.5–8.1)

## 2017-03-26 LAB — MAGNESIUM: MAGNESIUM: 1.7 mg/dL (ref 1.7–2.4)

## 2017-03-26 LAB — PSA: PROSTATIC SPECIFIC ANTIGEN: 122.94 ng/mL — AB (ref 0.00–4.00)

## 2017-03-26 MED ORDER — DENOSUMAB 120 MG/1.7ML ~~LOC~~ SOLN
120.0000 mg | Freq: Once | SUBCUTANEOUS | Status: AC
  Administered 2017-03-26: 120 mg via SUBCUTANEOUS
  Filled 2017-03-26: qty 1.7

## 2017-03-26 MED ORDER — LEUPROLIDE ACETATE 7.5 MG IM KIT
7.5000 mg | PACK | INTRAMUSCULAR | Status: DC
  Administered 2017-03-26: 7.5 mg via INTRAMUSCULAR
  Filled 2017-03-26: qty 7.5

## 2017-03-26 NOTE — Progress Notes (Signed)
[name] tolerated Xgeva and Lupron injections well without complaints or incident. Labs reviewed, Calcium 9.6, with Mike Craze NP and informed her that the pt recently went to the dentist for a cleaning and was noted to have some bleeding and an infection in his lower front gum area and was put on Amoxicillin which he completes today. Pt denied any tooth or jaw pain today and continues to take his PO Calcium as prescribed. OK to give both injections today per NP orders. VSS Pt discharged self ambulatory in satisfactory condition

## 2017-03-26 NOTE — Patient Instructions (Signed)
[place] at [facility] Discharge Instructions  RECOMMENDATIONS MADE BY THE CONSULTANT AND ANY TEST RESULTS WILL BE SENT TO YOUR REFERRING PHYSICIAN.  Received Xgeva and Lupron injections today. Follow-up as scheduled. Call clinic for any questions or concerns  Thank you for choosing [place] at [facility] to provide your oncology and hematology care.  To afford each patient quality time with our provider, please arrive at least 15 minutes before your scheduled appointment time.    If you have a lab appointment with the [facility] please come in thru the  Main Entrance and check in at the main information desk  You need to re-schedule your appointment should you arrive 10 or more minutes late.  We strive to give you quality time with our providers, and arriving late affects you and other patients whose appointments are after yours.  Also, if you no show three or more times for appointments you may be dismissed from the clinic at the providers discretion.     Again, thank you for choosing [facility].  Our hope is that these requests will decrease the amount of time that you wait before being seen by our physicians.       _____________________________________________________________  Should you have questions after your visit to [facility], please contact our office at [phone] between the hours of 8:30 a.m. and 4:30 p.m.  Voicemails left after 4:30 p.m. will not be returned until the following business day.  For prescription refill requests, have your pharmacy contact our office.       Resources For Cancer Patients and their Caregivers ? American Cancer Society: Can assist with transportation, wigs, general needs, runs Look Good Feel Better.        678-625-2000 ? Cancer Care: Provides financial assistance, online support groups, medication/co-pay assistance.  1-800-813-HOPE 306-397-6457) ? [facility] Assists [place] cancer patients and their families through emotional , educational and financial support.  9283249503 ? [place] DSS Where to apply for food stamps, Medicaid and utility assistance. 408-348-4303 ? RCATS: Transportation to medical appointments. 559-560-5267 ? Social Security Administration: May apply for disability if have a Stage IV cancer. (779)299-2646 (210)632-7529 ? [employer], Disability and Transit Services: Assists with nutrition, care and transit needs. [place] Support Programs: @10RELATIVEDAYS @ > Cancer Support Group  2nd Tuesday of the month 1pm-2pm, Journey Room  > Creative Journey  3rd Tuesday of the month 1130am-1pm, Journey Room  > Look Good Feel Better  1st Wednesday of the month 10am-12 noon, Journey Room (Call [facility] to register [phone])

## 2017-03-31 ENCOUNTER — Encounter (HOSPITAL_COMMUNITY): Payer: Self-pay | Admitting: Hematology and Oncology

## 2017-03-31 NOTE — Assessment & Plan Note (Signed)
72 y.o. 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  Repeat PSA is up to 83.3 up from 61.4 previously.  Restaging imaging with bone scan and CT of the chest/abdomen/pelvis does not show any gross progressive disease.  Clinical evaluation and lab workup permissive to proceed with chemotherapy.  Plan: -Resume cabazitaxel chemotherapy, cycle #21 today -Increase MS Contin to 60 mg twice daily, add short-acting morphine 30 mg every 4 hours as needed. -Due to anticipated constipation from opioids, and senna/docusate on the schedule twice a day plus MiraLAX as needed. -Return to clinic in 3 weeks: Labs, clinic visit, potentially next cycle of systemic chemotherapy

## 2017-03-31 NOTE — Progress Notes (Signed)
[facility] Follow-up Visit:  Assessment: Prostate cancer metastatic to bone [facility]) 72 y.o. 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  Repeat PSA is up to 83.3 up from 61.4 previously.  Restaging imaging with bone scan and CT of the chest/abdomen/pelvis does not show any gross progressive disease.  Clinical evaluation and lab workup permissive to proceed with chemotherapy.  Plan: -Resume cabazitaxel chemotherapy, cycle #21 today -Increase MS Contin to 60 mg twice daily, add short-acting morphine 30 mg every 4 hours as needed. -Due to anticipated constipation from opioids, and senna/docusate on the schedule twice a day plus MiraLAX as needed. -Return to clinic in 3 weeks: Labs, clinic visit, potentially next cycle of systemic chemotherapy  Voice recognition software was used and creation of this note. Despite my best effort at editing the text, some misspelling/errors may have occurred.  Orders Placed This Encounter  Procedures  . CBC with Differential    Standing Status:   Future    Number of Occurrences:   1    Standing Expiration Date:   03/18/2018  . Comprehensive metabolic panel    Standing Status:   Future    Number of Occurrences:   1    Standing Expiration Date:   03/18/2018  . PSA    Standing Status:   Future    Number of Occurrences:   1    Standing Expiration Date:   03/18/2018  . Magnesium    Standing Status:   Future    Number of Occurrences:   1    Standing Expiration Date:   03/18/2018    Cancer Staging Prostate cancer metastatic to bone [facility]) Staging form: Prostate, AJCC 7th Edition - Clinical: No stage assigned - Unsigned   All questions were answered.  . The patient knows to call the clinic with any problems, questions or concerns.  This note was electronically signed.    History of Presenting Illness Micheal Davis 72 y.o. presenting to the [facility] for Diagnosis of  metastatic, castrate refractory prostate cancer.  Currently patient is undergoing therapy with monthly Lupron and cabazitaxel with Neulasta support.  Patient returns to the clinic for consideration for possible cycle #20.  In the interim, patient continues to complain of peripheral neuropathy without significant change, he also complains of generalized weakness, fatigue, constipation, and swelling in lower extremities.  In addition to that, patient has chronic back pain and has some pain in the groin areas as well including pain in the shoulders, arms, buttocks and hips.  No interval fever, chills, night sweats.  Oncological/hematological History:   Prostate cancer metastatic to bone ([place])   10/01/2012 Initial Diagnosis    Prostate biopsied with highest Gleason score of 9 seen and the lowest score was 7.      10/04/2012 - 05/16/2013 Chemotherapy    Depo-Lupron and Casodex initiated      05/16/2013 -  Chemotherapy    Depo-Lupron monthly continued      05/16/2013 Progression    Progression by PSA elevation      05/16/2013 - 10/22/2014 Chemotherapy    Abiraterone and prednisone initiated in conjunction with ongoing Depo-Lupron.  Denosumab also ongoing.      10/23/2014 Progression    PSA increasing from 0.2- 1.6 in less than 6 months.        10/23/2014 - 01/30/2015 Chemotherapy    Enzalutamide and Prednisone (5 mg in AM and 2.5 mg in PM)      01/30/2015  Imaging    Bone scan- New focus of intense activity in right proximal humerus.  Interim increase in activity over left hip.      01/30/2015 Progression    Bone scan reveals new disease in right humerus consistent with progression of disease      01/31/2015 Imaging    Right humerus xray- blastic foci in proximal right humeral metaphysis and over right mid-humeral diaphysis.  No evidence of fracture      07/06/2015 Progression    Progression in multiple bones especially L hip and femurs      07/06/2015 Imaging    Bone scan- progressive  multifocal osseous metastases in the right proximal femora and distal femoral shafts.  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 Tammi Klippel      11/23/2015 - 01/04/2016 Chemotherapy    The patient had pegfilgrastim (NEULASTA ONPRO KIT) injection 6 mg, 6 mg, Subcutaneous, Once, 3 of 7 cycles  DOCEtaxel (TAXOTERE) 180 mg in dextrose 5 % 250 mL chemo infusion, 75 mg/m2 = 180 mg, Intravenous,  Once, 3 of 7 cycles Dose modification: 64 mg/m2 (original dose 75 mg/m2, Cycle 2, Reason: Dose not tolerated)  pegfilgrastim (NEULASTA ONPRO KIT) injection 6 mg, 6 mg, Subcutaneous, Once, 0 of 4 cycles  cabazitaxel (JEVTANA) 60 mg in dextrose 5 % 250 mL chemo infusion, 25 mg/m2, Intravenous,  Once, 0 of 4 cycles  for chemotherapy treatment.        11/30/2015 Adverse Reaction    Diarrhea (secondary to chemotherapy) and dehydration requiring IV fluids      12/14/2015 Treatment Plan Change    Docetaxel dose reduced by 15%      12/31/2015 Procedure    Port placed by Dr. Arnoldo Morale      01/28/2016 -  Chemotherapy    Cabazitaxel (Jevtana)       03/27/2016 Imaging    CT Chest, Abdomen, and Pelvis with contrast 1. Diffuse sclerotic osseous metastatic disease in the chest, abdomen, and pelvis without acute fracture identified. 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 These appear chronic. 2. The prostate gland is normal in size and no adenopathy is currently identified. 3. On a prior MRI of 10/04/2012, there was a posterior right hepatic lobe lesion. This lesion is not currently visible on today' s CT. This could be due to differences in cons acuity between CT or MRI, or resolution of the lesion. 4. Coronary, aortic arch, and branch vessel atherosclerotic vascular disease. Aortoiliac atherosclerotic vascular disease. 5. Single bilateral renal cysts.       04/16/2016 Imaging    Bone scan- Findings consist with progressive metastatic disease. Activity over the proximal right humerus and proximal bilateral femurs are worrisome for the possible development of pathologic fractures.      08/21/2016 Imaging    CT C/A/P: IMPRESSION: No significant change since 03/27/2016 CT. Diffuse bony metastases without other evidence of metastatic disease.      08/21/2016 Imaging    Bone Scan: IMPRESSION: Multiple areas of increased activity again noted throughout the axial and appendicular skeleton in similar locations as prior exam. Intensity of uptake is increased from prior exam suggesting progressive disease. Lesions present in the proximal humeri, proximal femurs, and the mid right femur susceptible to pathologic fracture.      12/02/2016 Imaging    CT C/A/P: IMPRESSION: 1. Overall stable  appearance of diffuse osseous metastatic disease and resulting patchy sclerosis. 2. The patient had a posterior right hepatic lobe lesion on prior MRI from 2014 which is been relatively occult on CT. Given the lack of progression I suspect that this is benign or has been effectively treated. 3. Aortic Atherosclerosis (ICD10-I70.0). Coronary atherosclerosis with mild cardiomegaly. 4. Cholelithiasis. 5. Bilateral benign renal cysts. 6. 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      12/02/2016 Imaging    Bone Scan: IMPRESSION: 1. Widespread osseous metastatic disease. Multiplicity is similar to previous exam with interval increase in degree of tracer uptake associated with multiple lesions.      03/11/2017 Imaging    Bone Scan: Multifocal skeletal disease without sign of progression CT C/A/P: Diffuse sclerotic skeletal lesions, unchanged from the previous.  No new lymphadenopathy,  pulmonary nodules, or hepatic lesions to suggest soft tissue progressive disease       Medical History: Past Medical History:  Diagnosis Date  . Diabetes mellitus without complication ([place])   . Hypertension   . Prostate cancer ([place]) 09/05/2015  . Prostate cancer metastatic to bone ([place]) 09/05/2015  . Sleep apnea   . Thyroid disease     Surgical History: Past Surgical History:  Procedure Laterality Date  . CATARACT EXTRACTION    . PORTACATH PLACEMENT Left 12/31/2015   Procedure: INSERTION PORT-A-CATH LEFT SUBCLAVIAN;  Surgeon: Aviva Signs, MD;  Location: [facility];  Service: General;  Laterality: Left;  . REPLACEMENT TOTAL KNEE Left     Family History: No family history on file.  Social History: Social History   Socioeconomic History  . Marital status: Married    Spouse name: Not on file  . Number of children: Not on file  . Years of education: Not on file  . Highest education level: Not on file  Social Needs  . Financial resource strain: Not on file  . Food insecurity - worry: Not on file  . Food insecurity - inability: Not on file  . Transportation needs - medical: Not on file  . Transportation needs - non-medical: Not on file  Occupational History  . Not on file  Tobacco Use  . Smoking status: Never Smoker  . Smokeless tobacco: Never Used  Substance and Sexual Activity  . Alcohol use: Yes    Comment: 1 beer each month  . Drug use: No  . Sexual activity: No    Comment: married  Other Topics Concern  . Not on file  Social History Narrative  . Not on file    Allergies: No Known Allergies  Medications:  Current Outpatient Medications  Medication Sig Dispense Refill  . atenolol (TENORMIN) 100 MG tablet Take 100 mg by mouth daily.    Marland Kitchen atorvastatin (LIPITOR) 20 MG tablet Take 10 mg by mouth daily.    . Cabazitaxel (JEVTANA IV) Inject into the vein. Every 3 weeks    . calcium carbonate (OS-CAL - DOSED IN MG OF ELEMENTAL CALCIUM) 1250 (500 Ca) MG tablet Take 2  tablets by mouth daily with breakfast.     . diltiazem (CARDIZEM CD) 300 MG 24 hr capsule Take 300 mg by mouth daily.    Marland Kitchen esomeprazole (NEXIUM) 20 MG capsule Take 1 capsule (20 mg total) by mouth daily at 12 noon. 30 capsule 5  . gabapentin (NEURONTIN) 300 MG capsule Take 1 capsule (  300 mg total) by mouth 3 (three) times daily. 90 capsule 3  . Glucosamine-Chondroit-Vit C-Mn (GLUCOSAMINE 1500 COMPLEX PO) Take 1 tablet by mouth 2 (two) times daily.     Marland Kitchen KLOR-CON M20 20 MEQ tablet TAKE 1 TABLET BY MOUTH ONCE DAILY 90 tablet 1  . levothyroxine (SYNTHROID, LEVOTHROID) 25 MCG tablet Take 25 mcg by mouth daily before breakfast.    . lidocaine-prilocaine (EMLA) cream Apply to affected area once 30 g 3  . loperamide (IMODIUM) 1 MG/5ML solution Take 1 mg by mouth as needed for diarrhea or loose stools.    . magic mouthwash w/lidocaine SOLN 1 part of each of the following: Benadryl 12.47m /547m Viscous lidocaine 2%, Maalox. Swish and swallow 5 mL QID. 240 mL 1  . metFORMIN (GLUCOPHAGE) 500 MG tablet Take 500 mg by mouth 2 (two) times daily with a meal.    . morphine (MS CONTIN) 60 MG 12 hr tablet Take 1 tablet (60 mg total) by mouth every 12 (twelve) hours. 60 tablet 0  . morphine (MSIR) 30 MG tablet Take 1 tablet (30 mg total) by mouth every 4 (four) hours as needed for up to 14 days for severe pain. 50 tablet 0  . Multiple Vitamin (MULTIVITAMIN WITH MINERALS) TABS tablet Take 1 tablet by mouth daily.    . Omega-3 Fatty Acids (FISH OIL) 1000 MG CAPS Take 1 capsule by mouth daily.    . ondansetron (ZOFRAN) 8 MG tablet Take 1 tablet (8 mg total) by mouth 2 (two) times daily as needed (Nausea or vomiting). 30 tablet 1  . Pegfilgrastim (NEULASTA ONPRO [place]) Inject into the skin. Every 21 days    . polyethylene glycol (MIRALAX) packet Take 34 g by mouth daily as needed for moderate constipation (if no bowel movement x48hrs). 14 each 3  . predniSONE (DELTASONE) 5 MG tablet TAKE 1 TABLET BY MOUTH TWICE DAILY WITH   A  MEAL 180 tablet 0  . prochlorperazine (COMPAZINE) 10 MG tablet Take 1 tablet (10 mg total) by mouth every 6 (six) hours as needed (Nausea or vomiting). 30 tablet 1  . senna-docusate (SENOKOT-S) 8.6-50 MG tablet Take 2 tablets by mouth 2 (two) times daily. 120 tablet 3  . tamsulosin (FLOMAX) 0.4 MG CAPS capsule Take 0.8 mg by mouth daily.     . Marland Kitchenriamterene-hydrochlorothiazide (MAXZIDE-25) 37.5-25 MG tablet Take 1 tablet by mouth daily. for high blood pressure  3  . vitamin B-12 (CYANOCOBALAMIN) 500 MCG tablet Take by mouth.     No current facility-administered medications for this visit.    Facility-Administered Medications Ordered in Other Visits  Medication Dose Route Frequency Provider Last Rate Last Dose  . leuprolide (LUPRON) injection 7.5 mg  7.5 mg Intramuscular Q28 days KeBaird CancerPA-C        Review of Systems: Review of Systems  Constitutional: Positive for fatigue.  Cardiovascular: Positive for leg swelling.  Gastrointestinal: Positive for constipation.  Genitourinary: Positive for pelvic pain.   Musculoskeletal: Positive for back pain.  Neurological: Positive for numbness.  Hematological: Bruises/bleeds easily.  All other systems reviewed and are negative.    PHYSICAL EXAMINATION There were no vitals taken for this visit.  ECOG PERFORMANCE STATUS: 2 - Symptomatic, <50% confined to bed  Physical Exam  Constitutional: He is oriented to person, place, and time and well-developed, well-nourished, and in no distress. No distress.  HENT:  Head: Normocephalic and atraumatic.  Mouth/Throat: Oropharynx is clear and moist. No oropharyngeal exudate.  Eyes: Conjunctivae and EOM  are normal. Pupils are equal, round, and reactive to light. No scleral icterus.  Neck: No thyromegaly present.  Cardiovascular: Normal rate, regular rhythm and normal heart sounds.  No murmur heard. Pulmonary/Chest: Effort normal and breath sounds normal. No respiratory distress. He has no  wheezes. He has no rales.  Abdominal: Soft. Bowel sounds are normal. He exhibits no distension. There is no tenderness. There is no rebound.  Musculoskeletal: He exhibits no edema.  Lymphadenopathy:    He has no cervical adenopathy.  Neurological: He is alert and oriented to person, place, and time. He has normal reflexes. No cranial nerve deficit.  Skin: Skin is warm and dry. No rash noted. He is not diaphoretic. No erythema.     LABORATORY DATA: I have personally reviewed the data as listed: Infusion on 03/18/2017  Component Date Value Ref Range Status  . Prostatic Specific Antigen 03/18/2017 148.00* 0.00 - 4.00 ng/mL Final   Comment: (NOTE) While PSA levels of <=4.0 ng/ml are reported as reference range, some men with levels below 4.0 ng/ml can have prostate cancer and many men with PSA above 4.0 ng/ml do not have prostate cancer.  Other tests such as free PSA, age specific reference ranges, PSA velocity and PSA doubling time may be helpful especially in men less than 43 years old. Performed at [facility], [place] [address] [place], [place] [address]   . Magnesium 03/18/2017 1.8  1.7 - 2.4 mg/dL Final  . WBC 03/18/2017 8.3  4.0 - 10.5 K/uL Final  . RBC 03/18/2017 3.87* 4.22 - 5.81 MIL/uL Final  . Hemoglobin 03/18/2017 11.8* 13.0 - 17.0 g/dL Final  . HCT 03/18/2017 36.3* 39.0 - 52.0 % Final  . MCV 03/18/2017 93.8  78.0 - 100.0 fL Final  . MCH 03/18/2017 30.5  26.0 - 34.0 pg Final  . MCHC 03/18/2017 32.5  30.0 - 36.0 g/dL Final  . RDW 03/18/2017 13.2  11.5 - 15.5 % Final  . Platelets 03/18/2017 327  150 - 400 K/uL Final  . Neutrophils Relative % 03/18/2017 85  % Final  . Neutro Abs 03/18/2017 7.0  1.7 - 7.7 K/uL Final  . Lymphocytes Relative 03/18/2017 9  % Final  . Lymphs Abs 03/18/2017 0.7  0.7 - 4.0 K/uL Final  . Monocytes Relative 03/18/2017 6  % Final  . Monocytes Absolute 03/18/2017 0.5  0.1 - 1.0 K/uL Final  . Eosinophils Relative 03/18/2017 0  % Final  .  Eosinophils Absolute 03/18/2017 0.0  0.0 - 0.7 K/uL Final  . Basophils Relative 03/18/2017 0  % Final  . Basophils Absolute 03/18/2017 0.0  0.0 - 0.1 K/uL Final  . Sodium 03/18/2017 137  135 - 145 mmol/L Final  . Potassium 03/18/2017 4.0  3.5 - 5.1 mmol/L Final  . Chloride 03/18/2017 98* 101 - 111 mmol/L Final  . CO2 03/18/2017 24  22 - 32 mmol/L Final  . Glucose, Bld 03/18/2017 192* 65 - 99 mg/dL Final  . BUN 03/18/2017 23* 6 - 20 mg/dL Final  . Creatinine, Ser 03/18/2017 0.92  0.61 - 1.24 mg/dL Final  . Calcium 03/18/2017 9.6  8.9 - 10.3 mg/dL Final  . Total Protein 03/18/2017 6.8  6.5 - 8.1 g/dL Final  . Albumin 03/18/2017 3.5  3.5 - 5.0 g/dL Final  . AST 03/18/2017 36  15 - 41 U/L Final  . ALT 03/18/2017 23  17 - 63 U/L Final  . Alkaline Phosphatase 03/18/2017 120  38 - 126 U/L Final  . Total  Bilirubin 03/18/2017 0.5  0.3 - 1.2 mg/dL Final  . GFR calc non Af Amer 03/18/2017 >60  >60 mL/min Final  . GFR calc Af Amer 03/18/2017 >60  >60 mL/min Final   Comment: (NOTE) The eGFR has been calculated using the CKD EPI equation. This calculation has not been validated in all clinical situations. eGFR's persistently <60 mL/min signify possible Chronic Kidney Disease.   . Anion gap 03/18/2017 15  5 - 15 Final  . Phosphorus 03/18/2017 4.4  2.5 - 4.6 mg/dL Final       Ardath Sax, MD

## 2017-04-07 ENCOUNTER — Other Ambulatory Visit (HOSPITAL_COMMUNITY): Payer: Self-pay

## 2017-04-07 DIAGNOSIS — C7951 Secondary malignant neoplasm of bone: Principal | ICD-10-CM

## 2017-04-07 DIAGNOSIS — C61 Malignant neoplasm of prostate: Secondary | ICD-10-CM

## 2017-04-08 ENCOUNTER — Inpatient Hospital Stay (HOSPITAL_COMMUNITY): Payer: Medicare Other

## 2017-04-08 ENCOUNTER — Inpatient Hospital Stay (HOSPITAL_COMMUNITY): Payer: Medicare Other | Attending: Internal Medicine | Admitting: Internal Medicine

## 2017-04-08 ENCOUNTER — Encounter (HOSPITAL_COMMUNITY): Payer: Self-pay

## 2017-04-08 VITALS — BP 130/70 | HR 50 | Temp 97.9°F | Resp 18

## 2017-04-08 DIAGNOSIS — C61 Malignant neoplasm of prostate: Secondary | ICD-10-CM

## 2017-04-08 DIAGNOSIS — C7951 Secondary malignant neoplasm of bone: Secondary | ICD-10-CM | POA: Diagnosis not present

## 2017-04-08 DIAGNOSIS — C787 Secondary malignant neoplasm of liver and intrahepatic bile duct: Secondary | ICD-10-CM | POA: Diagnosis not present

## 2017-04-08 DIAGNOSIS — C7800 Secondary malignant neoplasm of unspecified lung: Secondary | ICD-10-CM | POA: Diagnosis not present

## 2017-04-08 DIAGNOSIS — Z5111 Encounter for antineoplastic chemotherapy: Secondary | ICD-10-CM | POA: Insufficient documentation

## 2017-04-08 DIAGNOSIS — E079 Disorder of thyroid, unspecified: Secondary | ICD-10-CM | POA: Diagnosis not present

## 2017-04-08 LAB — CBC WITH DIFFERENTIAL/PLATELET
BASOS ABS: 0 10*3/uL (ref 0.0–0.1)
Basophils Relative: 0 %
EOS PCT: 1 %
Eosinophils Absolute: 0.1 10*3/uL (ref 0.0–0.7)
HEMATOCRIT: 35.7 % — AB (ref 39.0–52.0)
Hemoglobin: 11.4 g/dL — ABNORMAL LOW (ref 13.0–17.0)
LYMPHS ABS: 1.1 10*3/uL (ref 0.7–4.0)
LYMPHS PCT: 15 %
MCH: 30.2 pg (ref 26.0–34.0)
MCHC: 31.9 g/dL (ref 30.0–36.0)
MCV: 94.4 fL (ref 78.0–100.0)
MONO ABS: 0.5 10*3/uL (ref 0.1–1.0)
MONOS PCT: 7 %
NEUTROS ABS: 6 10*3/uL (ref 1.7–7.7)
Neutrophils Relative %: 77 %
PLATELETS: 222 10*3/uL (ref 150–400)
RBC: 3.78 MIL/uL — ABNORMAL LOW (ref 4.22–5.81)
RDW: 15.2 % (ref 11.5–15.5)
WBC: 7.7 10*3/uL (ref 4.0–10.5)

## 2017-04-08 LAB — MAGNESIUM: Magnesium: 1.8 mg/dL (ref 1.7–2.4)

## 2017-04-08 LAB — COMPREHENSIVE METABOLIC PANEL
ALT: 15 U/L — ABNORMAL LOW (ref 17–63)
ANION GAP: 11 (ref 5–15)
AST: 25 U/L (ref 15–41)
Albumin: 3.6 g/dL (ref 3.5–5.0)
Alkaline Phosphatase: 143 U/L — ABNORMAL HIGH (ref 38–126)
BILIRUBIN TOTAL: 0.9 mg/dL (ref 0.3–1.2)
BUN: 16 mg/dL (ref 6–20)
CHLORIDE: 100 mmol/L — AB (ref 101–111)
CO2: 26 mmol/L (ref 22–32)
Calcium: 9.2 mg/dL (ref 8.9–10.3)
Creatinine, Ser: 0.73 mg/dL (ref 0.61–1.24)
Glucose, Bld: 133 mg/dL — ABNORMAL HIGH (ref 65–99)
POTASSIUM: 4.2 mmol/L (ref 3.5–5.1)
Sodium: 137 mmol/L (ref 135–145)
TOTAL PROTEIN: 6.7 g/dL (ref 6.5–8.1)

## 2017-04-08 LAB — PSA: PROSTATIC SPECIFIC ANTIGEN: 138.85 ng/mL — AB (ref 0.00–4.00)

## 2017-04-08 MED ORDER — SODIUM CHLORIDE 0.9% FLUSH
10.0000 mL | INTRAVENOUS | Status: DC | PRN
  Administered 2017-04-08: 10 mL
  Filled 2017-04-08: qty 10

## 2017-04-08 MED ORDER — DEXTROSE 5 % IV SOLN
15.0000 mg/m2 | Freq: Once | INTRAVENOUS | Status: AC
  Administered 2017-04-08: 36 mg via INTRAVENOUS
  Filled 2017-04-08: qty 3.6

## 2017-04-08 MED ORDER — DIPHENHYDRAMINE HCL 50 MG/ML IJ SOLN
25.0000 mg | Freq: Once | INTRAMUSCULAR | Status: AC
  Administered 2017-04-08: 25 mg via INTRAVENOUS

## 2017-04-08 MED ORDER — FAMOTIDINE IN NACL 20-0.9 MG/50ML-% IV SOLN
INTRAVENOUS | Status: AC
  Filled 2017-04-08: qty 50

## 2017-04-08 MED ORDER — PEGFILGRASTIM 6 MG/0.6ML ~~LOC~~ PSKT
PREFILLED_SYRINGE | SUBCUTANEOUS | Status: AC
  Filled 2017-04-08: qty 0.6

## 2017-04-08 MED ORDER — DIPHENHYDRAMINE HCL 50 MG/ML IJ SOLN
INTRAMUSCULAR | Status: AC
  Filled 2017-04-08: qty 1

## 2017-04-08 MED ORDER — FAMOTIDINE IN NACL 20-0.9 MG/50ML-% IV SOLN
20.0000 mg | Freq: Once | INTRAVENOUS | Status: AC
  Administered 2017-04-08: 20 mg via INTRAVENOUS

## 2017-04-08 MED ORDER — SODIUM CHLORIDE 0.9 % IV SOLN
Freq: Once | INTRAVENOUS | Status: AC
  Administered 2017-04-08: 10:00:00 via INTRAVENOUS

## 2017-04-08 MED ORDER — PEGFILGRASTIM 6 MG/0.6ML ~~LOC~~ PSKT
6.0000 mg | PREFILLED_SYRINGE | Freq: Once | SUBCUTANEOUS | Status: AC
  Administered 2017-04-08: 6 mg via SUBCUTANEOUS

## 2017-04-08 MED ORDER — HEPARIN SOD (PORK) LOCK FLUSH 100 UNIT/ML IV SOLN
500.0000 [IU] | Freq: Once | INTRAVENOUS | Status: AC | PRN
  Administered 2017-04-08: 500 [IU]

## 2017-04-08 MED ORDER — DEXAMETHASONE SODIUM PHOSPHATE 10 MG/ML IJ SOLN
10.0000 mg | Freq: Once | INTRAMUSCULAR | Status: AC
  Administered 2017-04-08: 10 mg via INTRAVENOUS

## 2017-04-08 MED ORDER — DEXAMETHASONE SODIUM PHOSPHATE 10 MG/ML IJ SOLN
INTRAMUSCULAR | Status: AC
  Filled 2017-04-08: qty 1

## 2017-04-08 NOTE — Progress Notes (Signed)
Patient to treatment area after oncology follow up visit. Patient complained with generalized arthritis discomfort that is relieved by pain medications.  Recent fall at home with no injury.  Patient stated no worsening of neuropathy.  Good appetite and fair energy level.  Patient tolerated chemotherapy with no complaints voiced.  Port site clean and dry with no bruising or swelling noted at site.  Band aid applied.  Neulasta Onpro applied with green indicator light flashing.  VSs with discharge and left ambulatory with wife and no s/s of distress noted.

## 2017-04-08 NOTE — Patient Instructions (Signed)
[facility] Discharge Instructions for Patients Receiving Chemotherapy  Today you received the following chemotherapy agents jevtna.    If you develop nausea and vomiting that is not controlled by your nausea medication, call the clinic.   BELOW ARE SYMPTOMS THAT SHOULD BE REPORTED IMMEDIATELY:  *FEVER GREATER THAN 100.5 F  *CHILLS WITH OR WITHOUT FEVER  NAUSEA AND VOMITING THAT IS NOT CONTROLLED WITH YOUR NAUSEA MEDICATION  *UNUSUAL SHORTNESS OF BREATH  *UNUSUAL BRUISING OR BLEEDING  TENDERNESS IN MOUTH AND THROAT WITH OR WITHOUT PRESENCE OF ULCERS  *URINARY PROBLEMS  *BOWEL PROBLEMS  UNUSUAL RASH Items with * indicate a potential emergency and should be followed up as soon as possible.  Feel free to call the clinic should you have any questions or concerns. The clinic phone number is [phone].  Please show the [facility] at check-in to the Emergency Department and triage nurse.

## 2017-04-09 NOTE — Progress Notes (Signed)
His PSA is rising, ALP is rising.  I don't think Christinia Gully is working. Please hold future Jevtana, and have him see MD to discuss next line options within 2- 3 weeks. I have called him with results and explained that he will meet with another MD at his next visit when he comes in for Injection Lupron.

## 2017-04-19 ENCOUNTER — Encounter (HOSPITAL_COMMUNITY): Payer: Self-pay | Admitting: Internal Medicine

## 2017-04-19 ENCOUNTER — Other Ambulatory Visit (HOSPITAL_COMMUNITY): Payer: Self-pay | Admitting: Adult Health

## 2017-04-19 DIAGNOSIS — C7951 Secondary malignant neoplasm of bone: Principal | ICD-10-CM

## 2017-04-19 DIAGNOSIS — C61 Malignant neoplasm of prostate: Secondary | ICD-10-CM

## 2017-04-19 NOTE — Progress Notes (Signed)
[facility] Follow-up Visit:  History of Presenting Illness Micheal Davis 72 y.o. presenting to the [facility] for Diagnosis of metastatic, castrate refractory prostate cancer.  Currently patient is undergoing therapy with monthly Lupron and cabazitaxel with Neulasta support.   Patient complains of generalized arthritic discomfort  with no injury. Good appetite and fair energy level. Patient has stable tingling and numbness in finger tips and feet but no pain. He reportedly had tripped and fell at home last week and sustained a small bruise over his scalp. He continues to feel weak and I snot steady to ambulate due to generalized weakness and bilateral leg edema Oncological/hematological History:   Prostate cancer metastatic to bone ([place])   10/01/2012 Initial Diagnosis    Prostate biopsied with highest Gleason score of 9 seen and the lowest score was 7.      10/04/2012 - 05/16/2013 Chemotherapy    Depo-Lupron and Casodex initiated      05/16/2013 -  Chemotherapy    Depo-Lupron monthly continued      05/16/2013 Progression    Progression by PSA elevation      05/16/2013 - 10/22/2014 Chemotherapy    Abiraterone and prednisone initiated in conjunction with ongoing Depo-Lupron.  Denosumab also ongoing.      10/23/2014 Progression    PSA increasing from 0.2- 1.6 in less than 6 months.        10/23/2014 - 01/30/2015 Chemotherapy    Enzalutamide and Prednisone (5 mg in AM and 2.5 mg in PM)      01/30/2015 Imaging    Bone scan- New focus of intense activity in right proximal humerus.  Interim increase in activity over left hip.      01/30/2015 Progression    Bone scan reveals new disease in right humerus consistent with progression of disease      01/31/2015 Imaging    Right humerus xray- blastic foci in proximal right humeral metaphysis and over right mid-humeral diaphysis.  No evidence of fracture      07/06/2015 Progression    Progression in multiple bones  especially L hip and femurs      07/06/2015 Imaging    Bone scan- progressive multifocal osseous metastases in the right proximal femora and distal femoral shafts.  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 Tammi Klippel      11/23/2015 - 01/04/2016 Chemotherapy    The patient had pegfilgrastim (NEULASTA ONPRO KIT) injection 6 mg, 6 mg, Subcutaneous, Once, 3 of 7 cycles  DOCEtaxel (TAXOTERE) 180 mg in dextrose 5 % 250 mL chemo infusion, 75 mg/m2 = 180 mg, Intravenous,  Once, 3 of 7 cycles Dose modification: 64 mg/m2 (original dose 75 mg/m2, Cycle 2, Reason: Dose not tolerated)  pegfilgrastim (NEULASTA ONPRO KIT) injection 6 mg, 6 mg, Subcutaneous, Once, 0 of 4 cycles  cabazitaxel (JEVTANA) 60 mg in dextrose 5 % 250 mL chemo infusion, 25 mg/m2, Intravenous,  Once, 0 of 4 cycles  for chemotherapy treatment.        11/30/2015 Adverse Reaction    Diarrhea (secondary to chemotherapy) and dehydration requiring IV fluids      12/14/2015 Treatment Plan Change    Docetaxel dose reduced by 15%      12/31/2015 Procedure    Port placed by Dr. Arnoldo Morale      01/28/2016 -  Chemotherapy    Cabazitaxel (  Jevtana)       03/27/2016 Imaging    CT Chest, Abdomen, and Pelvis with contrast 1. Diffuse sclerotic osseous metastatic disease in the chest, abdomen, and pelvis without acute fracture identified. 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 These appear chronic. 2. The prostate gland is normal in size and no adenopathy is currently identified. 3. On a prior MRI of 10/04/2012, there was a posterior right hepatic lobe lesion. This lesion is not currently visible on today' s CT. This could be due to differences in cons acuity between CT or MRI, or resolution of the lesion. 4. Coronary, aortic arch, and branch vessel atherosclerotic vascular disease.  Aortoiliac atherosclerotic vascular disease. 5. Single bilateral renal cysts.      04/16/2016 Imaging    Bone scan- Findings consist with progressive metastatic disease. Activity over the proximal right humerus and proximal bilateral femurs are worrisome for the possible development of pathologic fractures.      08/21/2016 Imaging    CT C/A/P: IMPRESSION: No significant change since 03/27/2016 CT. Diffuse bony metastases without other evidence of metastatic disease.      08/21/2016 Imaging    Bone Scan: IMPRESSION: Multiple areas of increased activity again noted throughout the axial and appendicular skeleton in similar locations as prior exam. Intensity of uptake is increased from prior exam suggesting progressive disease. Lesions present in the proximal humeri, proximal femurs, and the mid right femur susceptible to pathologic fracture.      12/02/2016 Imaging    CT C/A/P: IMPRESSION: 1. Overall stable appearance of diffuse osseous metastatic disease and resulting patchy sclerosis. 2. The patient had a posterior right hepatic lobe lesion on prior MRI from 2014 which is been relatively occult on CT. Given the lack of progression I suspect that this is benign or has been effectively treated. 3. Aortic Atherosclerosis (ICD10-I70.0). Coronary atherosclerosis with mild cardiomegaly. 4. Cholelithiasis. 5. Bilateral benign renal cysts. 6. 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      12/02/2016 Imaging    Bone Scan: IMPRESSION: 1. Widespread osseous metastatic disease. Multiplicity is similar to previous exam with interval increase in degree of tracer uptake associated with multiple lesions.      03/11/2017 Imaging    Bone Scan: Multifocal skeletal disease without sign of progression CT C/A/P: Diffuse  sclerotic skeletal lesions, unchanged from the previous.  No new lymphadenopathy, pulmonary nodules, or hepatic lesions to suggest soft tissue progressive disease       Medical History: Past Medical History:  Diagnosis Date  . Diabetes mellitus without complication ([place])   . Hypertension   . Prostate cancer ([place]) 09/05/2015  . Prostate cancer metastatic to bone ([facility]) 09/05/2015  . Sleep apnea   . Thyroid disease     Surgical History: Past Surgical History:  Procedure Laterality Date  . CATARACT EXTRACTION    . PORTACATH PLACEMENT Left 12/31/2015   Procedure: INSERTION PORT-A-CATH LEFT SUBCLAVIAN;  Surgeon: Aviva Signs, MD;  Location: [facility];  Service: General;  Laterality: Left;  . REPLACEMENT TOTAL KNEE Left     Family History: History reviewed. No pertinent family history.  Social History: Social History   Socioeconomic History  . Marital status: Married    Spouse name: Not on file  . Number of children: Not on file  . Years of education: Not on file  .  Highest education level: Not on file  Social Needs  . Financial resource strain: Not on file  . Food insecurity - worry: Not on file  . Food insecurity - inability: Not on file  . Transportation needs - medical: Not on file  . Transportation needs - non-medical: Not on file  Occupational History  . Not on file  Tobacco Use  . Smoking status: Never Smoker  . Smokeless tobacco: Never Used  Substance and Sexual Activity  . Alcohol use: Yes    Comment: 1 beer each month  . Drug use: No  . Sexual activity: No    Comment: married  Other Topics Concern  . Not on file  Social History Narrative  . Not on file    Allergies: No Known Allergies  Medications:  Current Outpatient Medications  Medication Sig Dispense Refill  . atenolol (TENORMIN) 100 MG tablet Take 100 mg by mouth daily.    Marland Kitchen atorvastatin (LIPITOR) 20 MG tablet Take 10 mg by mouth daily.    . Cabazitaxel (JEVTANA IV) Inject into the vein. Every 3  weeks    . calcium carbonate (OS-CAL - DOSED IN MG OF ELEMENTAL CALCIUM) 1250 (500 Ca) MG tablet Take 2 tablets by mouth daily with breakfast.     . diltiazem (CARDIZEM CD) 300 MG 24 hr capsule Take 300 mg by mouth daily.    Marland Kitchen esomeprazole (NEXIUM) 20 MG capsule Take 1 capsule (20 mg total) by mouth daily at 12 noon. 30 capsule 5  . gabapentin (NEURONTIN) 300 MG capsule Take 1 capsule (300 mg total) by mouth 3 (three) times daily. 90 capsule 3  . Glucosamine-Chondroit-Vit C-Mn (GLUCOSAMINE 1500 COMPLEX PO) Take 1 tablet by mouth 2 (two) times daily.     Marland Kitchen KLOR-CON M20 20 MEQ tablet TAKE 1 TABLET BY MOUTH ONCE DAILY 90 tablet 1  . levothyroxine (SYNTHROID, LEVOTHROID) 25 MCG tablet Take 25 mcg by mouth daily before breakfast.    . lidocaine-prilocaine (EMLA) cream Apply to affected area once 30 g 3  . loperamide (IMODIUM) 1 MG/5ML solution Take 1 mg by mouth as needed for diarrhea or loose stools.    . magic mouthwash w/lidocaine SOLN 1 part of each of the following: Benadryl 12.51m /567m Viscous lidocaine 2%, Maalox. Swish and swallow 5 mL QID. 240 mL 1  . metFORMIN (GLUCOPHAGE) 500 MG tablet Take 500 mg by mouth 2 (two) times daily with a meal.    . Multiple Vitamin (MULTIVITAMIN WITH MINERALS) TABS tablet Take 1 tablet by mouth daily.    . Omega-3 Fatty Acids (FISH OIL) 1000 MG CAPS Take 1 capsule by mouth daily.    . ondansetron (ZOFRAN) 8 MG tablet Take 1 tablet (8 mg total) by mouth 2 (two) times daily as needed (Nausea or vomiting). 30 tablet 1  . Pegfilgrastim (NEULASTA ONPRO [place]) Inject into the skin. Every 21 days    . predniSONE (DELTASONE) 5 MG tablet TAKE 1 TABLET BY MOUTH TWICE DAILY WITH  A  MEAL 180 tablet 0  . prochlorperazine (COMPAZINE) 10 MG tablet Take 1 tablet (10 mg total) by mouth every 6 (six) hours as needed (Nausea or vomiting). 30 tablet 1  . senna-docusate (SENOKOT-S) 8.6-50 MG tablet Take 2 tablets by mouth 2 (two) times daily. 120 tablet 3  . tamsulosin (FLOMAX) 0.4 MG  CAPS capsule Take 0.8 mg by mouth daily.     . Marland Kitchenriamterene-hydrochlorothiazide (MAXZIDE-25) 37.5-25 MG tablet Take 1 tablet by mouth daily. for high blood pressure  3  . vitamin B-12 (CYANOCOBALAMIN) 500 MCG tablet Take by mouth.     No current facility-administered medications for this visit.    Facility-Administered Medications Ordered in Other Visits  Medication Dose Route Frequency Provider Last Rate Last Dose  . leuprolide (LUPRON) injection 7.5 mg  7.5 mg Intramuscular Q28 days [facility], PA-C        Review of Systems: Review of Systems  Constitutional: Positive for fatigue.  Cardiovascular: Positive for leg swelling.  Gastrointestinal: Positive for constipation.  Genitourinary: Positive for pelvic pain.   Musculoskeletal: Positive for back pain.  Neurological: Positive for numbness.  Hematological: Bruises/bleeds easily.  All other systems reviewed and are negative.    PHYSICAL EXAMINATION Vitals are stable per infusion encounter note  ECOG PERFORMANCE STATUS: 2 - Symptomatic, <50% confined to bed  Physical Exam  Constitutional: He is oriented to person, place, and time and well-developed, well-nourished, and in no distress. No distress.  HENT:  Head: Normocephalic.  Mouth/Throat: Oropharynx is clear and moist. No oropharyngeal exudate.  Eyes: Conjunctivae and EOM are normal. Pupils are equal, round, and reactive to light. No scleral icterus.  Neck: Normal range of motion. No tracheal deviation present. No thyromegaly present.  Cardiovascular: Normal rate and regular rhythm.  No murmur heard. Pulmonary/Chest: Effort normal. He has no wheezes. He has no rales.  Abdominal: He exhibits no distension. There is no tenderness. There is no rebound.  Musculoskeletal: He exhibits edema (B/l symmetric edema upto ankles).  Lymphadenopathy:    He has no cervical adenopathy.  Neurological: He is alert and oriented to person, place, and time. He has normal reflexes. No  cranial nerve deficit.  Skin: Skin is warm. No rash noted. He is not diaphoretic. No erythema.  Psychiatric: Mood, memory, affect and judgment normal.  Nursing note and vitals reviewed.  Port site clean and dry with no bruising or swelling noted at site   LABORATORY DATA: I have personally reviewed the data as listed: Lab on 04/08/2017  Component Date Value Ref Range Status  . Magnesium 04/08/2017 1.8  1.7 - 2.4 mg/dL Final   Performed at [facility], [address], [place], [place] [address]  . Prostatic Specific Antigen 04/08/2017 138.85* 0.00 - 4.00 ng/mL Final   Comment: (NOTE) While PSA levels of <=4.0 ng/ml are reported as reference range, some men with levels below 4.0 ng/ml can have prostate cancer and many men with PSA above 4.0 ng/ml do not have prostate cancer.  Other tests such as free PSA, age specific reference ranges, PSA velocity and PSA doubling time may be helpful especially in men less than 65 years old. Performed at [facility], [place] [address] [place], [place] [address]   . WBC 04/08/2017 7.7  4.0 - 10.5 K/uL Final  . RBC 04/08/2017 3.78* 4.22 - 5.81 MIL/uL Final  . Hemoglobin 04/08/2017 11.4* 13.0 - 17.0 g/dL Final  . HCT 04/08/2017 35.7* 39.0 - 52.0 % Final  . MCV 04/08/2017 94.4  78.0 - 100.0 fL Final  . MCH 04/08/2017 30.2  26.0 - 34.0 pg Final  . MCHC 04/08/2017 31.9  30.0 - 36.0 g/dL Final  . RDW 04/08/2017 15.2  11.5 - 15.5 % Final  . Platelets 04/08/2017 222  150 - 400 K/uL Final  . Neutrophils Relative % 04/08/2017 77  % Final  . Neutro Abs 04/08/2017 6.0  1.7 - 7.7 K/uL Final  . Lymphocytes Relative 04/08/2017 15  % Final  . Lymphs Abs 04/08/2017 1.1  0.7 - 4.0 K/uL Final  . Monocytes Relative 04/08/2017 7  % Final  . Monocytes Absolute 04/08/2017 0.5  0.1 - 1.0 K/uL Final  . Eosinophils Relative 04/08/2017 1  % Final  . Eosinophils Absolute 04/08/2017 0.1  0.0 - 0.7 K/uL Final  . Basophils Relative 04/08/2017 0  % Final  . Basophils  Absolute 04/08/2017 0.0  0.0 - 0.1 K/uL Final   Performed at [facility], [address] [place], [place] [address]  . Sodium 04/08/2017 137  135 - 145 mmol/L Final  . Potassium 04/08/2017 4.2  3.5 - 5.1 mmol/L Final  . Chloride 04/08/2017 100* 101 - 111 mmol/L Final  . CO2 04/08/2017 26  22 - 32 mmol/L Final  . Glucose, Bld 04/08/2017 133* 65 - 99 mg/dL Final  . BUN 04/08/2017 16  6 - 20 mg/dL Final  . Creatinine, Ser 04/08/2017 0.73  0.61 - 1.24 mg/dL Final  . Calcium 04/08/2017 9.2  8.9 - 10.3 mg/dL Final  . Total Protein 04/08/2017 6.7  6.5 - 8.1 g/dL Final  . Albumin 04/08/2017 3.6  3.5 - 5.0 g/dL Final  . AST 04/08/2017 25  15 - 41 U/L Final  . ALT 04/08/2017 15* 17 - 63 U/L Final  . Alkaline Phosphatase 04/08/2017 143* 38 - 126 U/L Final  . Total Bilirubin 04/08/2017 0.9  0.3 - 1.2 mg/dL Final  . GFR calc non Af Amer 04/08/2017 >60  >60 mL/min Final  . GFR calc Af Amer 04/08/2017 >60  >60 mL/min Final   Comment: (NOTE) The eGFR has been calculated using the CKD EPI equation. This calculation has not been validated in all clinical situations. eGFR's persistently <60 mL/min signify possible Chronic Kidney Disease.   Georgiann Hahn gap 04/08/2017 11  5 - 15 Final   Performed at [facility], [address] [place], [place] [address]   ASSESSMENT AND PLAN:  Metastatic prostate cancer with extensive bone, lung and liver metastasis. tolerating treatment well Cbc and cmp are stable PSA 03/26/17 declined to 123 from 148 on 03/18/2016, today's PSA is pending. Reviewed CT scans noted below  Ct - 03/13/2017 has been reviewed and shows stable osseous metastatic disease and stable lung nodules and right liver lesion No new metastasis noted Proceed with Cycle 21 Cabazitaxel today with peg filgrastim support  Return in 3 weeks  For cycle 22 and MD visit Return in 2 weeks for injection lupron and denosumad Refer to PT for gait and strength training.    Creola Corn, MD

## 2017-04-20 ENCOUNTER — Other Ambulatory Visit: Payer: Self-pay

## 2017-04-20 ENCOUNTER — Ambulatory Visit (HOSPITAL_COMMUNITY): Payer: Medicare Other | Attending: Internal Medicine

## 2017-04-20 ENCOUNTER — Encounter (HOSPITAL_COMMUNITY): Payer: Self-pay

## 2017-04-20 DIAGNOSIS — Z9181 History of falling: Secondary | ICD-10-CM | POA: Diagnosis not present

## 2017-04-20 DIAGNOSIS — R2689 Other abnormalities of gait and mobility: Secondary | ICD-10-CM | POA: Insufficient documentation

## 2017-04-20 DIAGNOSIS — M6281 Muscle weakness (generalized): Secondary | ICD-10-CM | POA: Diagnosis not present

## 2017-04-20 NOTE — Patient Instructions (Signed)
   BRIDGING: 1-2 sets of 10 repetitions ( 3 second hold)  While lying on your back with knees bent, tighten your lower abdominals, squeeze your buttocks and then raise your buttocks off the floor/bed as creating a "Bridge" with your body. Hold and then lower yourself and repeat.

## 2017-04-20 NOTE — Therapy (Signed)
[place], [place], [address] Phone: [phone]   Fax:  [phone]  Physical Therapy Evaluation  Patient Details  Name: Micheal Davis MRN: [id number] Date of Birth: 06-24-1945 Referring Provider: Creola Corn, MD   Encounter Date: 04/20/2017  PT End of Session - 04/20/17 1116    Visit Number  1    Number of Visits  13    Date for PT Re-Evaluation  05/11/17    Authorization Type  Medicare Part A and B    Authorization Time Period  04/20/17 - 06/01/17    PT Start Time  1116    PT Stop Time  1200    PT Time Calculation (min)  44 min    Activity Tolerance  Patient tolerated treatment well    Behavior During Therapy  [facility] for tasks assessed/performed       Past Medical History:  Diagnosis Date  . Diabetes mellitus without complication ([place])   . Hypertension   . Prostate cancer ([place]) 09/05/2015  . Prostate cancer metastatic to bone ([place]) 09/05/2015  . Sleep apnea   . Thyroid disease     Past Surgical History:  Procedure Laterality Date  . CATARACT EXTRACTION    . PORTACATH PLACEMENT Left 12/31/2015   Procedure: INSERTION PORT-A-CATH LEFT SUBCLAVIAN;  Surgeon: Aviva Signs, MD;  Location: [facility];  Service: General;  Laterality: Left;  . REPLACEMENT TOTAL KNEE Left     There were no vitals filed for this visit.   Subjective Assessment - 04/20/17 1124    Subjective  Patient reports a significant history of stage 4 prostate cnacer which he is currently recieving chemotherapy for. He states he has continued to become more fatigued with activity in teh last several months and that he feels his legs are getting weaker. He has some knee pain occasioanly from arthritis and reports he was initially going to have a knee replacement on the right but that he no longer can due to the progression of his prostate cancer. He reports he has fallen several times in the last 68m  nths and has ended up with bruises along his  side, arms, and back. His falls usually occur when he trips on something and he has fallen on level/unlevel ground and teh stairs at times. He would like to improve his balacne and activity tolerance overall.     Pertinent History  Stage 4 prostate cancer, Left TKA    Limitations  Walking;House hold activities    How long can you sit comfortably?  unlimited    How long can you stand comfortably?  severalm inutes before he becomes too fatigued and needs to sit to rest    How long can you walk comfortably?  ~5 minutes before too tired to keep walking    Patient Stated Goals  get stronger and have more energy to walk or do activites for longer amounts of time    Currently in Pain?  No/denies         [facility] PT Assessment - 04/20/17 0001      Assessment   Medical Diagnosis  Gait and Balance abnormalities    Referring Provider  Creola Corn, MD    Next MD Visit  1.5 weeks [facility]    Prior Therapy  8-9 years ago for Lt TKA      Precautions   Precautions  Fall      Restrictions   Weight Bearing Restrictions  No  Balance Screen   Has the patient fallen in the past 6 months  Yes    How many times?  4    Has the patient had a decrease in activity level because of a fear of falling?   Yes    Is the patient reluctant to leave their home because of a fear of falling?   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      Functional Tests   Functional tests  Single leg stance;Other      Single Leg Stance   Comments  Bil LE: 0 seconds      Other:   Other/ Comments  Tandem Balance: Lt foot back = 3 seconds; Rt foot back = 15 seconds      Posture/Postural Control   Posture/Postural Control  No significant limitations      ROM / Strength   AROM / PROM / Strength  Strength      Strength   Strength Assessment Site  Hip;Knee;Ankle    Right Hip Flexion  4-/5    Right Hip Extension  3+/5    Right Hip ABduction  4-/5    Left Hip Flexion  3+/5    Left Hip Extension  3+/5    Left Hip ABduction  3+/5    Right/Left Knee  Right;Left    Right Knee Flexion  4+/5    Right Knee Extension  4+/5    Left Knee Flexion  4-/5    Left Knee Extension  4/5    Right Ankle Dorsiflexion  4+/5    Left Ankle Dorsiflexion  4/5      Transfers   Five time sit to stand comments   No UE use, 18.6 seconds      Ambulation/Gait   Ambulation/Gait  Yes    Ambulation/Gait Assistance 7: Independent    Gait Velocity  0.97 m/s   Gait Pattern  Step-through pattern;Decreased stride length;Decreased hip/knee flexion - left;Decreased hip/knee flexion - right;Trendelenburg;Trunk flexed;Poor foot clearance - right;Poor foot clearance - left;Wide base of support    Stairs  Yes    Stair Management Technique  One rail Right;Alternating pattern;Forwards;Step to pattern    Number of Stairs  4 3 times    Height of Stairs  6    Gait Comments  Ascend/descend step over step with 1 hand rail; uses step to pattern with no hand rails to ascend/descend stairs      Standardized Balance Assessment   Standardized Balance Assessment  Dynamic Gait Index      Dynamic Gait Index   Level Surface  Normal    Change in Gait Speed  Normal    Gait with Horizontal  Head Turns  Mild Impairment    Gait with Vertical Head Turns  Mild Impairment    Gait and Pivot Turn  Mild Impairment    Step Over  Obstacle  Mild Impairment    Step Around Obstacles  Mild Impairment    Steps  Moderate Impairment    Total Score  17    DGI comment:  17/21 = indicated patient is a fall risk       Objective measurements completed on examination: See above findings.    [facility] Adult PT Treatment/Exercise - 04/20/17 0001      Exercises   Exercises  Knee/Hip      Knee/Hip Exercises: Supine   Bridges  10 reps;1 set;Both       PT Education - 04/20/17 1221    Education provided  Yes    Education Details  Educated on exam findings and initiated HEP    Person(s) Educated  Patient    Methods  Explanation;Handout    Comprehension  Verbalized understanding       PT Short Term Goals - 04/20/17 1116      PT SHORT TERM GOAL #1   Title  Patient will be independent with HEP    Time  3    Period  Weeks    Status  New    Target Date  05/11/17      PT SHORT TERM GOAL #2   Title  Patient will improve MMT for limited groups by 1/2 grade to demonstrate improved functional strength for improved performance with stairs and gait.    Time  3    Period  Weeks    Status  New      PT SHORT TERM GOAL #3   Title  Patient will demosntrate improved balance by performing SLS for Bil LE for 10 seconds each to imprvoe performance to ascend/descend stairs with decreased risk of falling.    Time  3    Period  Weeks    Status  New        PT Long Term Goals - 04/20/17 1225      PT LONG TERM GOAL #1   Title  Patient will improve MMT for limited groups by 1 grade to demonstrate improved functional strength for improved performance with stairs and gait.    Time  6    Period  Weeks    Status  New    Target Date  06/01/17      PT LONG TERM GOAL #2   Title  Patient will perform 5x sit to stand in = or <12 seconds to demonstrate improve functional LE strength and to decrease risk of falling.    Time  6    Period  Weeks    Status  New      PT LONG TERM GOAL #3   Title  Patient will score =>20/24 on the DGI to  demonstrate significant improvement in balance during gait and decrease risk of falling.    Time  6    Period  Weeks    Status  New        Plan - 04/20/17 1231    Clinical Impression Statement  Mr. Stockman presents for initial PT evaluation for difficulty with gait and balance and weakness of his lower extremities. Objective testing revealed proximal weakness of Bil LE, decreased balance, impaired posture, decreased activity tolerance, and his Dynamic Gait Index testing revealed he is at an increased risk of falling during high level walking activities.  Mr. Penley will benefit from skilled PT services to address current impairments and improve functional mobility to decrease risk of falling.     History and Personal Factors relevant to plan of care:  Stage 4 prostate cancer, Left TKA 8-9 years ago    Clinical Presentation  Stable    Clinical Presentation due to:  decreased balance, MMT, DGI, decreased activity tolerance, history of falls    Clinical Decision Making  Low    Rehab Potential  Good    PT Frequency  2x / week    PT Duration  6 weeks    PT Treatment/Interventions  ADLs/Self Care Home Management;Cryotherapy;Electrical Stimulation;Moist Heat;Gait training;DME Instruction;Stair training;Functional mobility training;Therapeutic activities;Therapeutic exercise;Balance training;Neuromuscular re-education;Patient/family education;Energy conservation    PT Next Visit Plan  Review Eval and goals. Initiate proxmial hip strengthening and singel limb balance training. Add SLS at counter and SLR to HEP.    PT Home Exercise Plan  Eval: bridge;     Consulted and Agree with Plan of Care  Patient       Patient will benefit from skilled therapeutic intervention in order to improve the following deficits and impairments:  Abnormal gait, Decreased activity tolerance, Decreased endurance, Decreased strength, Decreased balance, Decreased mobility, Difficulty walking, Obesity, Improper body mechanics,  Impaired sensation  Visit Diagnosis: Other abnormalities of gait and mobility  Muscle weakness (generalized)  History of falling     Problem List Patient Active Problem List   Diagnosis Date Noted  . Gastroesophageal reflux disease 03/13/2016  . Prostate cancer metastatic to bone ([place]) 09/05/2015  . Nausea 08/21/2015  . SOB (shortness of breath) on exertion 08/21/2015  . 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 Osteoarthritis of right knee 01/16/2014    Kipp Brood, PT, DPT Physical Therapist with [facility]  04/20/2017, 12:33 PM  [place] [address] [place], [place], [address] Phone: [phone]   Fax:  [phone]  Name: Micheal Davis MRN: [id number] Date of Birth: [birth year]

## 2017-04-22 ENCOUNTER — Encounter (HOSPITAL_COMMUNITY): Payer: Self-pay

## 2017-04-22 ENCOUNTER — Ambulatory Visit (HOSPITAL_COMMUNITY): Payer: Medicare Other

## 2017-04-22 DIAGNOSIS — M6281 Muscle weakness (generalized): Secondary | ICD-10-CM | POA: Diagnosis not present

## 2017-04-22 DIAGNOSIS — R2689 Other abnormalities of gait and mobility: Secondary | ICD-10-CM | POA: Diagnosis not present

## 2017-04-22 DIAGNOSIS — Z9181 History of falling: Secondary | ICD-10-CM

## 2017-04-22 NOTE — Therapy (Signed)
[place], [place], [address] Phone: [phone]   Fax:  [phone]  Physical Therapy Treatment  Patient Details  Name: Micheal Davis MRN: [id number] Date of Birth: Jul 22, 1945 Referring Provider: Creola Corn, MD   Encounter Date: 04/22/2017  PT End of Session - 04/22/17 0945    Visit Number  2    Number of Visits  13    Date for PT Re-Evaluation  05/11/17    Authorization Type  Medicare Part A and B    Authorization Time Period  04/20/17 - 06/01/17    PT Start Time  0945    PT Stop Time  1026    PT Time Calculation (min)  41 min    Activity Tolerance  Patient tolerated treatment well    Behavior During Therapy  [facility] for tasks assessed/performed       Past Medical History:  Diagnosis Date  . Diabetes mellitus without complication ([place])   . Hypertension   . Prostate cancer ([place]) 09/05/2015  . Prostate cancer metastatic to bone ([place]) 09/05/2015  . Sleep apnea   . Thyroid disease     Past Surgical History:  Procedure Laterality Date  . CATARACT EXTRACTION    . PORTACATH PLACEMENT Left 12/31/2015   Procedure: INSERTION PORT-A-CATH LEFT SUBCLAVIAN;  Surgeon: Aviva Signs, MD;  Location: [facility];  Service: General;  Laterality: Left;  . REPLACEMENT TOTAL KNEE Left     There were no vitals filed for this visit.  Subjective Assessment - 04/22/17 0945    Subjective  Pt states that he was in a lot of pain following Monday's eval. His back is what was so sore. He states that he did his HEP once. He reports that he's feeling better now.     Pertinent History  Stage 4 prostate cancer, Left TKA    Limitations  Walking;House hold activities    How long can you sit comfortably?  unlimited    How long can you stand comfortably?  severalm inutes before he becomes too fatigued and needs to sit to rest    How long can you walk comfortably?  ~5 minutes before too tired to keep walking    Patient Stated Goals  get  stronger and have more energy to walk or do activites for longer amounts of time    Currently in Pain?  No/denies          [facility] Adult PT Treatment/Exercise - 04/22/17 0001      Knee/Hip Exercises: Standing   Lateral Step Up  Both;10 reps;Step Height: 4"    Forward Step Up  Both;10 reps;Step Height: 4"      Knee/Hip Exercises: Seated   Sit to Sand  10 reps;without UE support      Knee/Hip Exercises: Supine   Bridges  2 sets;10 reps    Straight Leg Raises  Both;2 sets;10 reps      Knee/Hip Exercises: Sidelying   Clams  BLE 2x10 with RTB       Balance Exercises - 04/22/17 1010      Balance Exercises: Standing   Tandem Stance  Eyes open;Intermittent upper extremity support;5 reps;10 secs BLE    SLS  Eyes open;Solid surface;Intermittent upper extremity support;5 reps;10 secs BLE    Step Over Hurdles / Cones  lat step over 6" hurdle x10 RT    Cone Rotation Limitations  cone taps on firm x5RT each (intermittent UE assist)    Other Standing Exercises  bil  tandem stance + OH lifts with 2# weight bar x10 reps each           PT Education - 04/22/17 0945    Education provided  Yes    Education Details  reviewed goals, exercise technique, HEP    Person(s) Educated  Patient    Methods  Explanation;Handout;Demonstration    Comprehension  Verbalized understanding;Returned demonstration       PT Short Term Goals - 04/20/17 1116      PT SHORT TERM GOAL #1   Title  Patient will be independent with HEP    Time  3    Period  Weeks    Status  New    Target Date  05/11/17      PT SHORT TERM GOAL #2   Title  Patient will improve MMT for limited groups by 1/2 grade to demonstrate improved functional strength for improved performance with stairs and gait.    Time  3    Period  Weeks    Status  New      PT SHORT TERM GOAL #3   Title  Patient will demosntrate improved balance by performing SLS for Bil LE for 10 seconds each to imprvoe performance to ascend/descend stairs with  decreased risk of falling.    Time  3    Period  Weeks    Status  New        PT Long Term Goals - 04/20/17 1225      PT LONG TERM GOAL #1   Title  Patient will improve MMT for limited groups by 1 grade to demonstrate improved functional strength for improved performance with stairs and gait.    Time  6    Period  Weeks    Status  New    Target Date  06/01/17      PT LONG TERM GOAL #2   Title  Patient will perform 5x sit to stand in = or <12 seconds to demonstrate improve functional LE strength and to decrease risk of falling.    Time  6    Period  Weeks    Status  New      PT LONG TERM GOAL #3   Title  Patient will score =>20/24 on the DGI to demonstrate significant improvement in balance during gait and decrease risk of falling.    Time  6    Period  Weeks    Status  New            Plan - 04/22/17 1027    Clinical Impression Statement  Began session by reviewing initial goals and issuing copy of eval with no f/u questions. Gluteal and functional strengthening along with balance were the focus of today's session. Min cues required for form with therex. Pt also demo'ing muscular fatigue and requiring 2-3 rest breaks during session. Pt with difficulty with NBOS balance. He reported muscle fatigue and soreness at EOS. Educated that this is normal response and should improve in 1-2 days.    Rehab Potential  Good    PT Frequency  2x / week    PT Duration  6 weeks    PT Treatment/Interventions  ADLs/Self Care Home Management;Cryotherapy;Electrical Stimulation;Moist Heat;Gait training;DME Instruction;Stair training;Functional mobility training;Therapeutic activities;Therapeutic exercise;Balance training;Neuromuscular re-education;Patient/family education;Energy conservation    PT Next Visit Plan  Continue proxmial hip strengthening and singel limb balance training, progressing as able; add standing hip abd/ext and sidestepping next visit    PT Home Exercise Plan  Eval: bridge;  2/20: SLR, SLS    Consulted and Agree with Plan of Care  Patient       Patient will benefit from skilled therapeutic intervention in order to improve the following deficits and impairments:  Abnormal gait, Decreased activity tolerance, Decreased endurance, Decreased strength, Decreased balance, Decreased mobility, Difficulty walking, Obesity, Improper body mechanics, Impaired sensation  Visit Diagnosis: Other abnormalities of gait and mobility  Muscle weakness (generalized)  History of falling     Problem List Patient Active Problem List   Diagnosis Date Noted  . Gastroesophageal reflux disease 03/13/2016  . Prostate cancer metastatic to bone ([place]) 09/05/2015  . Nausea 08/21/2015  . SOB (shortness of breath) on exertion 08/21/2015  . 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 Osteoarthritis of right knee 01/16/2014       Geraldine Solar PT, DPT  [place] [address] [place], [place], [address] Phone: [phone]   Fax:  [phone]  Name: Jubal Rademaker MRN: [id number] Date of Birth: May 18, 1945

## 2017-04-22 NOTE — Patient Instructions (Signed)
  STRAIGHT LEG RAISE - SLR  While lying on your back, raise up your leg with a straight knee.  Keep the opposite knee bent with the foot planted on the ground.  Perform with other exercises, 2-3 sets of 10-15 reps   SINGLE LEG STANCE - SLS  Stand on one leg and maintain your balance.  Perform with other exercises, 10-20 reps on each leg holding for at least 10 seconds

## 2017-04-23 ENCOUNTER — Encounter (HOSPITAL_COMMUNITY): Payer: Self-pay | Admitting: Internal Medicine

## 2017-04-23 ENCOUNTER — Inpatient Hospital Stay (HOSPITAL_COMMUNITY): Payer: Medicare Other

## 2017-04-23 ENCOUNTER — Inpatient Hospital Stay (HOSPITAL_BASED_OUTPATIENT_CLINIC_OR_DEPARTMENT_OTHER): Payer: Medicare Other | Admitting: Internal Medicine

## 2017-04-23 ENCOUNTER — Other Ambulatory Visit (HOSPITAL_COMMUNITY): Payer: Self-pay | Admitting: Pharmacist

## 2017-04-23 VITALS — BP 157/80 | HR 63 | Temp 98.3°F | Resp 16 | Wt 241.5 lb

## 2017-04-23 DIAGNOSIS — C7951 Secondary malignant neoplasm of bone: Principal | ICD-10-CM

## 2017-04-23 DIAGNOSIS — G620 Drug-induced polyneuropathy: Secondary | ICD-10-CM

## 2017-04-23 DIAGNOSIS — C61 Malignant neoplasm of prostate: Secondary | ICD-10-CM

## 2017-04-23 DIAGNOSIS — C7800 Secondary malignant neoplasm of unspecified lung: Secondary | ICD-10-CM | POA: Diagnosis not present

## 2017-04-23 DIAGNOSIS — Z5111 Encounter for antineoplastic chemotherapy: Secondary | ICD-10-CM | POA: Diagnosis not present

## 2017-04-23 DIAGNOSIS — E079 Disorder of thyroid, unspecified: Secondary | ICD-10-CM | POA: Diagnosis not present

## 2017-04-23 DIAGNOSIS — T451X5A Adverse effect of antineoplastic and immunosuppressive drugs, initial encounter: Secondary | ICD-10-CM

## 2017-04-23 LAB — COMPREHENSIVE METABOLIC PANEL
ALBUMIN: 3.7 g/dL (ref 3.5–5.0)
ALT: 18 U/L (ref 17–63)
AST: 32 U/L (ref 15–41)
Alkaline Phosphatase: 182 U/L — ABNORMAL HIGH (ref 38–126)
Anion gap: 12 (ref 5–15)
BUN: 11 mg/dL (ref 6–20)
CHLORIDE: 105 mmol/L (ref 101–111)
CO2: 24 mmol/L (ref 22–32)
CREATININE: 0.74 mg/dL (ref 0.61–1.24)
Calcium: 8.5 mg/dL — ABNORMAL LOW (ref 8.9–10.3)
GFR calc Af Amer: >60 mL/min (ref >60)
GLUCOSE: 150 mg/dL — AB (ref 65–99)
Potassium: 3.5 mmol/L (ref 3.5–5.1)
SODIUM: 141 mmol/L (ref 135–145)
Total Bilirubin: 0.7 mg/dL (ref 0.3–1.2)
Total Protein: 6.3 g/dL — ABNORMAL LOW (ref 6.5–8.1)

## 2017-04-23 LAB — CBC WITH DIFFERENTIAL/PLATELET
BASOS ABS: 0 10*3/uL (ref 0.0–0.1)
Basophils Relative: 0 %
EOS PCT: 1 %
Eosinophils Absolute: 0.1 10*3/uL (ref 0.0–0.7)
HCT: 35.6 % — ABNORMAL LOW (ref 39.0–52.0)
Hemoglobin: 11.4 g/dL — ABNORMAL LOW (ref 13.0–17.0)
LYMPHS PCT: 8 %
Lymphs Abs: 0.8 10*3/uL (ref 0.7–4.0)
MCH: 30.5 pg (ref 26.0–34.0)
MCHC: 32 g/dL (ref 30.0–36.0)
MCV: 95.2 fL (ref 78.0–100.0)
Monocytes Absolute: 0.5 10*3/uL (ref 0.1–1.0)
Monocytes Relative: 4 %
Neutro Abs: 9.3 10*3/uL — ABNORMAL HIGH (ref 1.7–7.7)
Neutrophils Relative %: 87 %
PLATELETS: 130 10*3/uL — AB (ref 150–400)
RBC: 3.74 MIL/uL — AB (ref 4.22–5.81)
RDW: 15.8 % — ABNORMAL HIGH (ref 11.5–15.5)
WBC: 10.6 10*3/uL — AB (ref 4.0–10.5)

## 2017-04-23 LAB — LACTATE DEHYDROGENASE: LDH: 243 U/L — AB (ref 98–192)

## 2017-04-23 MED ORDER — PREDNISONE 5 MG PO TABS: ORAL_TABLET | ORAL | 0 refills | Status: DC

## 2017-04-23 MED ORDER — LEUPROLIDE ACETATE 7.5 MG IM KIT
7.5000 mg | PACK | INTRAMUSCULAR | Status: DC
  Administered 2017-04-23: 7.5 mg via INTRAMUSCULAR
  Filled 2017-04-23: qty 7.5

## 2017-04-23 MED ORDER — GABAPENTIN 300 MG PO CAPS: 300.0000 mg | ORAL_CAPSULE | Freq: Three times a day (TID) | ORAL | 3 refills | Status: DC

## 2017-04-23 MED ORDER — MORPHINE SULFATE ER 60 MG PO TBCR: 60.0000 mg | EXTENDED_RELEASE_TABLET | Freq: Two times a day (BID) | ORAL | 0 refills | Status: DC

## 2017-04-23 MED ORDER — MORPHINE SULFATE 30 MG PO TABS: 30.0000 mg | ORAL_TABLET | ORAL | 0 refills | Status: AC | PRN

## 2017-04-23 MED ORDER — ONDANSETRON HCL 8 MG PO TABS: 8.0000 mg | ORAL_TABLET | Freq: Two times a day (BID) | ORAL | 1 refills | Status: DC | PRN

## 2017-04-23 MED ORDER — DENOSUMAB 120 MG/1.7ML ~~LOC~~ SOLN
120.0000 mg | Freq: Once | SUBCUTANEOUS | Status: AC
  Administered 2017-04-23: 120 mg via SUBCUTANEOUS
  Filled 2017-04-23: qty 1.7

## 2017-04-23 NOTE — Progress Notes (Signed)
Patient seen by Dr. Walden Field today for oncology follow up visit.  Ok to treat today with Xgeva and lupron.  Calcium 8.5 today and ok for Xgeva.   Patient stated he does take a calcium tab BID.  Denied tooth, jaw, and leg pain.  Patient tolerated Xgeva and lupron shot with no complaints voiced.  Both injection sites clean and dry with no bruising or swelling noted at sites.  No complaints of pain with injections. Band aids applied.  VSS with discharge and left ambulatory in stable condition with wife.  No s/s of distress noted.

## 2017-04-23 NOTE — Patient Instructions (Signed)
[place] at [facility]  Discharge Instructions:  You received xgeva and lupron today.  Call for any questions or concerns.  _______________________________________________________________  Thank you for choosing [place] at [facility] to provide your oncology and hematology care.  To afford each patient quality time with our providers, please arrive at least 15 minutes before your scheduled appointment.  You need to re-schedule your appointment if you arrive 10 or more minutes late.  We strive to give you quality time with our providers, and arriving late affects you and other patients whose appointments are after yours.  Also, if you no show three or more times for appointments you may be dismissed from the clinic.  Again, thank you for choosing [place] at [place] hope is that these requests will allow you access to exceptional care and in a timely manner. _______________________________________________________________  If you have questions after your visit, please contact our office at [phone] between the hours of 8:30 a.m. and 5:00 p.m. Voicemails left after 4:30 p.m. will not be returned until the following business day. _______________________________________________________________  For prescription refill requests, have your pharmacy contact our office. _______________________________________________________________  Recommendations made by the consultant and any test results will be sent to your referring physician. _______________________________________________________________

## 2017-04-23 NOTE — Patient Instructions (Addendum)
[place] at [facility] Discharge Instructions  RECOMMENDATIONS MADE BY THE CONSULTANT AND ANY TEST RESULTS WILL BE SENT TO YOUR REFERRING PHYSICIAN  You saw Dr. Mathis Dad Higgs today. We will get you scheduled for another scan in March or April and at that time we will check your PSA levels again. This will be a better indicator of how you are responding to treatment. We will continue with [place] this month.  We will hold it next month to correspond with your eye surgery  And your next scans.   You will get your Lupron and Xgeva today.   See schedulers up front for appointments.  Thank you for choosing [place] at [facility] to provide your oncology and hematology care.  To afford each patient quality time with our provider, please arrive at least 15 minutes before your scheduled appointment time.    If you have a lab appointment with the [facility] please come in thru the  Main Entrance and check in at the main information desk  You need to re-schedule your appointment should you arrive 10 or more minutes late.  We strive to give you quality time with our providers, and arriving late affects you and other patients whose appointments are after yours.  Also, if you no show three or more times for appointments you may be dismissed from the clinic at the providers discretion.     Again, thank you for choosing [facility].  Our hope is that these requests will decrease the amount of time that you wait before being seen by our physicians.       _____________________________________________________________  Should you have questions after your visit to [facility], please contact our office at [phone] between the hours of 8:30 a.m. and 4:30 p.m.  Voicemails left after 4:30 p.m. will not be returned until the following business day.  For prescription refill requests, have your pharmacy contact our office.        Resources For Cancer Patients and their Caregivers ? American Cancer Society: Can assist with transportation, wigs, general needs, runs Look Good Feel Better.        234-724-1147 ? Cancer Care: Provides financial assistance, online support groups, medication/co-pay assistance.  1-800-813-HOPE 6206564938) ? [facility] Assists [place] cancer patients and their families through emotional , educational and financial support.  249-840-5138 ? [place] DSS Where to apply for food stamps, Medicaid and utility assistance. (920)381-7773 ? [facility]: Transportation to medical appointments. (508)109-3093 ? Social Security Administration: May apply for disability if have a Stage IV cancer. 806-122-2970 2198534646 ? [employer], Disability and Transit Services: Assists with nutrition, care and transit needs. [place] Support Programs: @10RELATIVEDAYS @ > Cancer Support Group  2nd Tuesday of the month 1pm-2pm, Journey Room  > Creative Journey  3rd Tuesday of the month 1130am-1pm, Journey Room  > Look Good Feel Better  1st Wednesday of the month 10am-12 noon, Journey Room (Call [place] to register [phone])

## 2017-04-27 ENCOUNTER — Other Ambulatory Visit: Payer: Self-pay

## 2017-04-27 ENCOUNTER — Encounter (HOSPITAL_COMMUNITY): Payer: Self-pay | Admitting: Physical Therapy

## 2017-04-27 ENCOUNTER — Ambulatory Visit (HOSPITAL_COMMUNITY): Payer: Medicare Other | Admitting: Physical Therapy

## 2017-04-27 DIAGNOSIS — R2689 Other abnormalities of gait and mobility: Secondary | ICD-10-CM

## 2017-04-27 DIAGNOSIS — M6281 Muscle weakness (generalized): Secondary | ICD-10-CM

## 2017-04-27 DIAGNOSIS — Z9181 History of falling: Secondary | ICD-10-CM | POA: Diagnosis not present

## 2017-04-27 NOTE — Therapy (Signed)
[place], [place], [address] Phone: [phone]   Fax:  [phone]  Physical Therapy Treatment  Patient Details  Name: Micheal Davis MRN: [id number] Date of Birth: 05-30-1945 Referring Provider: Creola Corn, MD   Encounter Date: 04/27/2017  PT End of Session - 04/27/17 1043    Visit Number  3    Number of Visits  13    Date for PT Re-Evaluation  05/11/17    Authorization Type  Medicare Part A and B    Authorization Time Period  04/20/17 - 06/01/17    Authorization - Visit Number  3    Authorization - Number of Visits  10    PT Start Time  6387    PT Stop Time  1105    PT Time Calculation (min)  42 min    Activity Tolerance  Patient tolerated treatment well    Behavior During Therapy  [facility] for tasks assessed/performed       Past Medical History:  Diagnosis Date  . Diabetes mellitus without complication ([place])   . Hypertension   . Prostate cancer ([place]) 09/05/2015  . Prostate cancer metastatic to bone ([place]) 09/05/2015  . Sleep apnea   . Thyroid disease     Past Surgical History:  Procedure Laterality Date  . CATARACT EXTRACTION    . PORTACATH PLACEMENT Left 12/31/2015   Procedure: INSERTION PORT-A-CATH LEFT SUBCLAVIAN;  Surgeon: Aviva Signs, MD;  Location: [facility];  Service: General;  Laterality: Left;  . REPLACEMENT TOTAL KNEE Left     There were no vitals filed for this visit.  Subjective Assessment - 04/27/17 1020    Subjective  Pt states that therapy is about to kill him.  He is having more pain after therapy.  Right now he has an abcess tooth as well .     Pertinent History  Stage 4 prostate cancer, Left TKA    Limitations  Walking;House hold activities    How long can you sit comfortably?  unlimited    How long can you stand comfortably?  severalm inutes before he becomes too fatigued and needs to sit to rest    How long can you walk comfortably?  ~5 minutes before too tired to keep walking     Patient Stated Goals  get stronger and have more energy to walk or do activites for longer amounts of time    Currently in Pain?  Yes    Pain Score  4  PT has had pain medication    Pain Location  Hip    Pain Orientation  Right;Left    Pain Descriptors / Indicators  Aching                      [facility] Adult PT Treatment/Exercise - 04/27/17 0001      Exercises   Exercises  Knee/Hip      Knee/Hip Exercises: Standing   Functional Squat  5 reps    Rocker Board  2 minutes    SLS  x5    Other Standing Knee Exercises  standing hip abduction/extension B x 5 @          Balance Exercises - 04/27/17 1033      Balance Exercises: Standing   Tandem Stance  Foam/compliant surface;5 reps x2    Tandem Gait  Forward;1 rep    Sidestepping  1 rep    Step Over Hurdles / Cones  lat step over 6"  hurdle x10 RT    Marching Limitations  10    Heel Raises Limitations  10    Toe Raise Limitations  10          PT Short Term Goals - 04/27/17 1107      PT SHORT TERM GOAL #1   Title  Patient will be independent with HEP    Time  3    Period  Weeks    Status  On-going      PT SHORT TERM GOAL #2   Title  Patient will improve MMT for limited groups by 1/2 grade to demonstrate improved functional strength for improved performance with stairs and gait.    Time  3    Period  Weeks    Status  On-going      PT SHORT TERM GOAL #3   Title  Patient will demosntrate improved balance by performing SLS for Bil LE for 10 seconds each to imprvoe performance to ascend/descend stairs with decreased risk of falling.    Time  3    Period  Weeks    Status  On-going        PT Long Term Goals - 04/27/17 1108      PT LONG TERM GOAL #1   Title  Patient will improve MMT for limited groups by 1 grade to demonstrate improved functional strength for improved performance with stairs and gait.    Time  6    Period  Weeks    Status  On-going      PT LONG TERM GOAL #2   Title  Patient will  perform 5x sit to stand in = or <12 seconds to demonstrate improve functional LE strength and to decrease risk of falling.    Time  6    Period  Weeks    Status  On-going      PT LONG TERM GOAL #3   Title  Patient will score =>20/24 on the DGI to demonstrate significant improvement in balance during gait and decrease risk of falling.    Time  6    Period  Weeks    Status  On-going            Plan - 04/27/17 1044    Clinical Impression Statement  PT continues to have increased pain after therapy session but none during session; therefore gave pt several rest breaks (3).  Pt Lt LE has noted increased compared to right LE .  Treatment focused on balance with min to mod assistance to keep pt in balance.      Rehab Potential  Good    PT Frequency  2x / week    PT Duration  6 weeks    PT Treatment/Interventions  ADLs/Self Care Home Management;Cryotherapy;Electrical Stimulation;Moist Heat;Gait training;DME Instruction;Stair training;Functional mobility training;Therapeutic activities;Therapeutic exercise;Balance training;Neuromuscular re-education;Patient/family education;Energy conservation    PT Next Visit Plan  Continue proxmial hip strengthening and singel limb balance training, progressing as able;    PT Home Exercise Plan  Eval: bridge; 2/20: SLR, SLS    Consulted and Agree with Plan of Care  Patient       Patient will benefit from skilled therapeutic intervention in order to improve the following deficits and impairments:  Abnormal gait, Decreased activity tolerance, Decreased endurance, Decreased strength, Decreased balance, Decreased mobility, Difficulty walking, Obesity, Improper body mechanics, Impaired sensation  Visit Diagnosis: Other abnormalities of gait and mobility  Muscle weakness (generalized)  History of falling     Problem List Patient  Active Problem List   Diagnosis Date Noted  . Gastroesophageal reflux disease 03/13/2016  . Prostate cancer metastatic to  bone ([place]) 09/05/2015  . Nausea 08/21/2015  . SOB (shortness of breath) on exertion 08/21/2015  . 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 Osteoarthritis of right knee 01/16/2014    Rayetta Humphrey, PT CLT [phone] 04/27/2017, 11:09 AM  [place], [place], [address] Phone: [phone]   Fax:  [phone]  Name: Keanu Lesniak MRN: [id number] Date of Birth: May 25, 1945

## 2017-04-29 ENCOUNTER — Inpatient Hospital Stay (HOSPITAL_COMMUNITY): Payer: Medicare Other

## 2017-04-29 ENCOUNTER — Ambulatory Visit (HOSPITAL_COMMUNITY): Payer: Medicare Other

## 2017-04-29 ENCOUNTER — Other Ambulatory Visit (HOSPITAL_COMMUNITY): Payer: Medicare Other

## 2017-04-29 ENCOUNTER — Ambulatory Visit (HOSPITAL_COMMUNITY): Payer: Medicare Other | Admitting: Internal Medicine

## 2017-04-29 ENCOUNTER — Encounter (HOSPITAL_COMMUNITY): Payer: Self-pay

## 2017-04-29 VITALS — BP 159/73 | HR 53 | Temp 97.7°F | Resp 18 | Wt 232.8 lb

## 2017-04-29 DIAGNOSIS — C7951 Secondary malignant neoplasm of bone: Principal | ICD-10-CM

## 2017-04-29 DIAGNOSIS — E079 Disorder of thyroid, unspecified: Secondary | ICD-10-CM | POA: Diagnosis not present

## 2017-04-29 DIAGNOSIS — C61 Malignant neoplasm of prostate: Secondary | ICD-10-CM

## 2017-04-29 DIAGNOSIS — Z5111 Encounter for antineoplastic chemotherapy: Secondary | ICD-10-CM | POA: Diagnosis not present

## 2017-04-29 DIAGNOSIS — C7800 Secondary malignant neoplasm of unspecified lung: Secondary | ICD-10-CM | POA: Diagnosis not present

## 2017-04-29 LAB — COMPREHENSIVE METABOLIC PANEL
ALT: 18 U/L (ref 17–63)
AST: 32 U/L (ref 15–41)
Albumin: 3.9 g/dL (ref 3.5–5.0)
Alkaline Phosphatase: 139 U/L — ABNORMAL HIGH (ref 38–126)
Anion gap: 14 (ref 5–15)
BILIRUBIN TOTAL: 0.5 mg/dL (ref 0.3–1.2)
BUN: 11 mg/dL (ref 6–20)
CO2: 23 mmol/L (ref 22–32)
CREATININE: 0.74 mg/dL (ref 0.61–1.24)
Calcium: 9.6 mg/dL (ref 8.9–10.3)
Chloride: 102 mmol/L (ref 101–111)
GFR calc non Af Amer: >60 mL/min (ref >60)
Glucose, Bld: 173 mg/dL — ABNORMAL HIGH (ref 65–99)
Potassium: 4 mmol/L (ref 3.5–5.1)
Sodium: 139 mmol/L (ref 135–145)
Total Protein: 6.8 g/dL (ref 6.5–8.1)

## 2017-04-29 LAB — CBC WITH DIFFERENTIAL/PLATELET
Basophils Absolute: 0 10*3/uL (ref 0.0–0.1)
Basophils Relative: 1 %
Eosinophils Absolute: 0.1 10*3/uL (ref 0.0–0.7)
Eosinophils Relative: 1 %
HCT: 38.8 % — ABNORMAL LOW (ref 39.0–52.0)
Hemoglobin: 12.3 g/dL — ABNORMAL LOW (ref 13.0–17.0)
Lymphocytes Relative: 15 %
Lymphs Abs: 0.9 10*3/uL (ref 0.7–4.0)
MCH: 29.6 pg (ref 26.0–34.0)
MCHC: 31.7 g/dL (ref 30.0–36.0)
MCV: 93.5 fL (ref 78.0–100.0)
Monocytes Absolute: 0.4 10*3/uL (ref 0.1–1.0)
Monocytes Relative: 7 %
Neutro Abs: 4.7 10*3/uL (ref 1.7–7.7)
Neutrophils Relative %: 76 %
Platelets: 199 10*3/uL (ref 150–400)
RBC: 4.15 MIL/uL — ABNORMAL LOW (ref 4.22–5.81)
RDW: 15.7 % — ABNORMAL HIGH (ref 11.5–15.5)
WBC: 6.1 10*3/uL (ref 4.0–10.5)

## 2017-04-29 LAB — LACTATE DEHYDROGENASE: LDH: 201 U/L — ABNORMAL HIGH (ref 98–192)

## 2017-04-29 MED ORDER — DEXAMETHASONE SODIUM PHOSPHATE 10 MG/ML IJ SOLN
10.0000 mg | Freq: Once | INTRAMUSCULAR | Status: AC
  Administered 2017-04-29: 10 mg via INTRAVENOUS
  Filled 2017-04-29: qty 1

## 2017-04-29 MED ORDER — SODIUM CHLORIDE 0.9 % IV SOLN
Freq: Once | INTRAVENOUS | Status: AC
  Administered 2017-04-29: 10:00:00 via INTRAVENOUS

## 2017-04-29 MED ORDER — FAMOTIDINE IN NACL 20-0.9 MG/50ML-% IV SOLN
20.0000 mg | Freq: Once | INTRAVENOUS | Status: AC
  Administered 2017-04-29: 20 mg via INTRAVENOUS
  Filled 2017-04-29: qty 50

## 2017-04-29 MED ORDER — DIPHENHYDRAMINE HCL 50 MG/ML IJ SOLN
25.0000 mg | Freq: Once | INTRAMUSCULAR | Status: AC
  Administered 2017-04-29: 25 mg via INTRAVENOUS
  Filled 2017-04-29: qty 1

## 2017-04-29 MED ORDER — HEPARIN SOD (PORK) LOCK FLUSH 100 UNIT/ML IV SOLN
500.0000 [IU] | Freq: Once | INTRAVENOUS | Status: AC | PRN
  Administered 2017-04-29: 500 [IU]
  Filled 2017-04-29: qty 5

## 2017-04-29 MED ORDER — SODIUM CHLORIDE 0.9% FLUSH
10.0000 mL | INTRAVENOUS | Status: DC | PRN
  Administered 2017-04-29: 10 mL
  Filled 2017-04-29: qty 10

## 2017-04-29 MED ORDER — PEGFILGRASTIM 6 MG/0.6ML ~~LOC~~ PSKT
6.0000 mg | PREFILLED_SYRINGE | Freq: Once | SUBCUTANEOUS | Status: AC
  Administered 2017-04-29: 6 mg via SUBCUTANEOUS
  Filled 2017-04-29: qty 0.6

## 2017-04-29 MED ORDER — DEXTROSE 5 % IV SOLN
15.0000 mg/m2 | Freq: Once | INTRAVENOUS | Status: AC
  Administered 2017-04-29: 36 mg via INTRAVENOUS
  Filled 2017-04-29: qty 3.6

## 2017-04-29 NOTE — Patient Instructions (Signed)
[facility] Discharge Instructions for Patients Receiving Chemotherapy   Beginning January 23rd 2017 lab work for the [facility] will be done in the  Main lab at [facility] on 1st floor. If you have a lab appointment with the [facility] please come in thru the  Main Entrance and check in at the main information desk   Today you received the following chemotherapy agents Jevtana as well as Neulasta on-pro. Follow-up as scheduled. Call clinic for any questions or concerns  To help prevent nausea and vomiting after your treatment, we encourage you to take your nausea medication   If you develop nausea and vomiting, or diarrhea that is not controlled by your medication, call the clinic.  The clinic phone number is [phone]. Office hours are Monday-Friday 8:30am-5:00pm.  BELOW ARE SYMPTOMS THAT SHOULD BE REPORTED IMMEDIATELY:  *FEVER GREATER THAN 101.0 F  *CHILLS WITH OR WITHOUT FEVER  NAUSEA AND VOMITING THAT IS NOT CONTROLLED WITH YOUR NAUSEA MEDICATION  *UNUSUAL SHORTNESS OF BREATH  *UNUSUAL BRUISING OR BLEEDING  TENDERNESS IN MOUTH AND THROAT WITH OR WITHOUT PRESENCE OF ULCERS  *URINARY PROBLEMS  *BOWEL PROBLEMS  UNUSUAL RASH Items with * indicate a potential emergency and should be followed up as soon as possible. If you have an emergency after office hours please contact your primary care physician or go to the nearest emergency department.  Please call the clinic during office hours if you have any questions or concerns.   You may also contact the Patient Navigator at [phone] should you have any questions or need assistance in obtaining follow up care.      Resources For Cancer Patients and their Caregivers ? American Cancer Society: Can assist with transportation, wigs, general needs, runs Look Good Feel Better.        256-446-8470 ? Cancer Care: Provides financial assistance, online support groups, medication/co-pay  assistance.  1-800-813-HOPE (989)624-1047) ? [place] Assists [place] cancer patients and their families through emotional , educational and financial support.  712-239-4832 ? [place] DSS Where to apply for food stamps, Medicaid and utility assistance. 3344110725 ? [employer]: Transportation to medical appointments. (231)181-4781 ? Social Security Administration: May apply for disability if have a Stage IV cancer. ([phone] [phone] ? [employer], Disability and Transit Services: Assists with nutrition, care and transit needs. 8598611056

## 2017-04-29 NOTE — Progress Notes (Signed)
Micheal Davis tolerated Jevtana infusion with Neulasta on-pro well without complaints or incident. Labs reviewed prior to administering these medications. Neulasta on-pro applied to pt's right arm with green indicator light flashing. VSS upon discharge. Pt discharged self ambulatory in satisfactory condition accompanied by his wife

## 2017-04-30 ENCOUNTER — Telehealth (HOSPITAL_COMMUNITY): Payer: Self-pay | Admitting: *Deleted

## 2017-04-30 ENCOUNTER — Telehealth (HOSPITAL_COMMUNITY): Payer: Self-pay | Admitting: Family Medicine

## 2017-04-30 ENCOUNTER — Other Ambulatory Visit (HOSPITAL_COMMUNITY): Payer: Self-pay | Admitting: Adult Health

## 2017-04-30 DIAGNOSIS — C61 Malignant neoplasm of prostate: Secondary | ICD-10-CM

## 2017-04-30 DIAGNOSIS — R112 Nausea with vomiting, unspecified: Secondary | ICD-10-CM

## 2017-04-30 DIAGNOSIS — C7951 Secondary malignant neoplasm of bone: Principal | ICD-10-CM

## 2017-04-30 MED ORDER — ONDANSETRON 8 MG PO TBDP: 8.0000 mg | ORAL_TABLET | Freq: Three times a day (TID) | ORAL | 1 refills | Status: DC | PRN

## 2017-04-30 NOTE — Telephone Encounter (Signed)
04/30/17  pt said he had chemo yesterday and has been sick all night and just doesn't think he can make this appt.

## 2017-04-30 NOTE — Telephone Encounter (Signed)
Pt phoned clinic c/o n/v onset yesterday evening, after having received chemotherapy (Jevtana) yesterday morning.  States he is taking his nausea medication, but is unable to keep it down d/t n/v. Also reports he is having a hard time controlling his pain d/t unable to keep down pain meds. Reford states this is the third time now this has happened after receiving chemotherapy, but that usually the nausea usually passes.  Spoke with Fortunato Curling, NP - she sent prescription for Zofran 8 mg ODT to his pharmacy.  Also advised that pt begin taking his nausea medications around the clock, starting on the day of tx for 2 days.  Instructed to advise pt to call the clinic tomorrow morning if he is not better and we will set him up to receive IVF.  Pt made aware and verbalizes understanding of plan.

## 2017-04-30 NOTE — Progress Notes (Signed)
zofran ODT  

## 2017-05-01 ENCOUNTER — Inpatient Hospital Stay (HOSPITAL_COMMUNITY): Payer: Medicare Other | Attending: Internal Medicine

## 2017-05-01 ENCOUNTER — Encounter (HOSPITAL_COMMUNITY): Payer: Self-pay

## 2017-05-01 ENCOUNTER — Ambulatory Visit (HOSPITAL_COMMUNITY): Payer: Medicare Other

## 2017-05-01 VITALS — BP 150/69 | HR 69 | Temp 98.1°F | Resp 18

## 2017-05-01 DIAGNOSIS — C787 Secondary malignant neoplasm of liver and intrahepatic bile duct: Secondary | ICD-10-CM | POA: Insufficient documentation

## 2017-05-01 DIAGNOSIS — I10 Essential (primary) hypertension: Secondary | ICD-10-CM | POA: Diagnosis not present

## 2017-05-01 DIAGNOSIS — C7951 Secondary malignant neoplasm of bone: Secondary | ICD-10-CM | POA: Insufficient documentation

## 2017-05-01 DIAGNOSIS — C61 Malignant neoplasm of prostate: Secondary | ICD-10-CM | POA: Diagnosis not present

## 2017-05-01 DIAGNOSIS — I700 Atherosclerosis of aorta: Secondary | ICD-10-CM | POA: Diagnosis not present

## 2017-05-01 DIAGNOSIS — E119 Type 2 diabetes mellitus without complications: Secondary | ICD-10-CM | POA: Insufficient documentation

## 2017-05-01 DIAGNOSIS — Z5111 Encounter for antineoplastic chemotherapy: Secondary | ICD-10-CM | POA: Diagnosis not present

## 2017-05-01 DIAGNOSIS — E079 Disorder of thyroid, unspecified: Secondary | ICD-10-CM | POA: Insufficient documentation

## 2017-05-01 LAB — CBC WITH DIFFERENTIAL/PLATELET
Basophils Absolute: 0 10*3/uL (ref 0.0–0.1)
Basophils Relative: 0 %
EOS ABS: 0 10*3/uL (ref 0.0–0.7)
EOS PCT: 0 %
HCT: 34.8 % — ABNORMAL LOW (ref 39.0–52.0)
Hemoglobin: 11.5 g/dL — ABNORMAL LOW (ref 13.0–17.0)
LYMPHS ABS: 0.6 10*3/uL — AB (ref 0.7–4.0)
Lymphocytes Relative: 2 %
MCH: 29.7 pg (ref 26.0–34.0)
MCHC: 33 g/dL (ref 30.0–36.0)
MCV: 89.9 fL (ref 78.0–100.0)
MONO ABS: 0.6 10*3/uL (ref 0.1–1.0)
Monocytes Relative: 2 %
NEUTROS ABS: 29.3 10*3/uL — AB (ref 1.7–7.7)
Neutrophils Relative %: 96 %
Platelets: 176 10*3/uL (ref 150–400)
RBC: 3.87 MIL/uL — ABNORMAL LOW (ref 4.22–5.81)
RDW: 15.7 % — AB (ref 11.5–15.5)
WBC: 30.5 10*3/uL — ABNORMAL HIGH (ref 4.0–10.5)

## 2017-05-01 LAB — BASIC METABOLIC PANEL
Anion gap: 15 (ref 5–15)
BUN: 11 mg/dL (ref 6–20)
CALCIUM: 9 mg/dL (ref 8.9–10.3)
CHLORIDE: 88 mmol/L — AB (ref 101–111)
CO2: 23 mmol/L (ref 22–32)
CREATININE: 0.82 mg/dL (ref 0.61–1.24)
GFR calc Af Amer: >60 mL/min (ref >60)
GFR calc non Af Amer: >60 mL/min (ref >60)
Glucose, Bld: 165 mg/dL — ABNORMAL HIGH (ref 65–99)
Potassium: 3.7 mmol/L (ref 3.5–5.1)
Sodium: 126 mmol/L — ABNORMAL LOW (ref 135–145)

## 2017-05-01 MED ORDER — SODIUM CHLORIDE 0.9% FLUSH
10.0000 mL | INTRAVENOUS | Status: DC | PRN
  Administered 2017-05-01: 10 mL via INTRAVENOUS
  Filled 2017-05-01: qty 10

## 2017-05-01 MED ORDER — HEPARIN SOD (PORK) LOCK FLUSH 100 UNIT/ML IV SOLN
500.0000 [IU] | Freq: Once | INTRAVENOUS | Status: AC
  Administered 2017-05-01: 500 [IU] via INTRAVENOUS

## 2017-05-01 MED ORDER — PROMETHAZINE HCL 25 MG/ML IJ SOLN
12.5000 mg | Freq: Once | INTRAMUSCULAR | Status: AC
  Administered 2017-05-01: 12.5 mg via INTRAVENOUS

## 2017-05-01 MED ORDER — SODIUM CHLORIDE 0.9 % IV SOLN
Freq: Once | INTRAVENOUS | Status: AC
  Administered 2017-05-01: 14:00:00 via INTRAVENOUS

## 2017-05-01 MED ORDER — PROMETHAZINE HCL 25 MG/ML IJ SOLN
INTRAMUSCULAR | Status: AC
  Filled 2017-05-01: qty 1

## 2017-05-01 NOTE — Telephone Encounter (Signed)
Patient called cancer center today and before I could return call, he was already on his way to the cancer center. He states he feels terrible  He has pain in his right shoulder, lower back, and right groin areas. He describes it as sore and achy. Rates as 6/10. He is weak from vomiting yesterday. He is not eating and has only had the equivalent of about 1 bottle of water to drink today. He is very shaky and is using a cane or a wheelchair to aid in ambulation. He states every time he gets the OnPro injection he feels bad. He says he is using the claritin as directed to help with the side effects of the OnPro. Denies fever and chills. VS are 98.2 temp, 78 pulse, 20 resp, 154/73 bp, and 99% O2sat. Reviewed with Dr. Walden Field nurse, Diane. Dr. Walden Field ordered labs and IVF's. Transferred patient to treatment area via [place], to Cleta Alberts, Therapist, sports.

## 2017-05-01 NOTE — Patient Instructions (Addendum)
[place] at [facility] Discharge Instructions  Received Hydration and Phenergan today. Follow-up as scheduled. Call clinic for any questions or concerns   Thank you for choosing [place] at [facility] to provide your oncology and hematology care.  To afford each patient quality time with our provider, please arrive at least 15 minutes before your scheduled appointment time.   If you have a lab appointment with the [facility] please come in thru the  Main Entrance and check in at the main information desk  You need to re-schedule your appointment should you arrive 10 or more minutes late.  We strive to give you quality time with our providers, and arriving late affects you and other patients whose appointments are after yours.  Also, if you no show three or more times for appointments you may be dismissed from the clinic at the providers discretion.     Again, thank you for choosing [facility].  Our hope is that these requests will decrease the amount of time that you wait before being seen by our physicians.       _____________________________________________________________  Should you have questions after your visit to [facility], please contact our office at [phone] between the hours of 8:30 a.m. and 4:30 p.m.  Voicemails left after 4:30 p.m. will not be returned until the following business day.  For prescription refill requests, have your pharmacy contact our office.       Resources For Cancer Patients and their Caregivers ? American Cancer Society: Can assist with transportation, wigs, general needs, runs Look Good Feel Better.        559-614-1246 ? Cancer Care: Provides financial assistance, online support groups, medication/co-pay assistance.  1-800-813-HOPE 216-643-0030) ? [place] Assists [place] cancer patients and their families through emotional , educational  and financial support.  559-435-5265 ? [place] DSS Where to apply for food stamps, Medicaid and utility assistance. 857 778 6273 ? [employer]: Transportation to medical appointments. 276-344-1465 ? Social Security Administration: May apply for disability if have a Stage IV cancer. 325-084-1938 864-107-0186 ? [employer], Disability and Transit Services: Assists with nutrition, care and transit needs. [place] Support Programs:   > Cancer Support Group  2nd Tuesday of the month 1pm-2pm, Journey Room   > Creative Journey  3rd Tuesday of the month 1130am-1pm, Journey Room

## 2017-05-01 NOTE — Progress Notes (Signed)
Micheal Davis to clinic due to poor appetite and fluid intake,nausea,weakness and increased pain from recent chemo tx with Neulasta.Davis using a wheelchair; unable to walk due to weakness                                    Labs reviewed with Dr. Walden Field. Davis tolerated hydration with Phenergan IV well without complaints or incident.Davis remains very drowsy from Phenergan Davis's wife instructed to reschedule his dental appt for a  tooth extraction until after 05/18/17 since he had an Xgeva injection last month and we will hold his next Xgeva per MD orders. Davis's wife verbalized understanding. VSS upon discharge. Davis discharged via wheelchair in stable condition accompanied by his wife and Charlyne Petrin RN

## 2017-05-01 NOTE — Progress Notes (Signed)
Physician asked if I could talk with the patient and see how he is feeling at this time.  When I got to the infusion room Micheal Davis was resting with eyes closed.  He was arousable.  I asked him how he was feeling and he states " some better."  Per Dr. Walden Field I offered to the patient to stay overnight in the hospital as observation to see if he continued to have pain and/or n/v.  Patient denies wanting to stay, states " I'll pass and just go home."  I advised wife to call us over the weekend and explained the triage call center.  I told her to call if he had any worsening symptoms or symptoms that he couldn't control with instructions already given to him.  She verbalizes understanding.   She then notified me that patient was to have a tooth pulled next week due to abscess.  I explained to her that they should have let us know about any dental work prior due to him being on Xgeva injections monthly.  She states that she didn't know that.   I spoke with Dr. Walden Field and she would like to at least put off his dental extractions until week of 3/18.  Micheal Davis, patient's wife and patient are aware of this and verbalize understanding.  Per Dr. Walden Field we will also hold his next month's Xgeva injection.  The note has already been made about holding xgeva.  We will also be moving CT and bone scans up to next week as Dr. Walden Field is concerned now about progression of disease.  We will resume treatments as appropriate after scans and follow up visit with Dr. Walden Field

## 2017-05-04 ENCOUNTER — Encounter (HOSPITAL_COMMUNITY): Payer: Self-pay | Admitting: *Deleted

## 2017-05-04 ENCOUNTER — Telehealth (HOSPITAL_COMMUNITY): Payer: Self-pay | Admitting: Family Medicine

## 2017-05-04 ENCOUNTER — Ambulatory Visit (HOSPITAL_COMMUNITY): Payer: Medicare Other | Admitting: Physical Therapy

## 2017-05-04 NOTE — Telephone Encounter (Signed)
05/04/17  he is still sick from his chemo last week and hopes he will be in later this week

## 2017-05-04 NOTE — Progress Notes (Signed)
Diagnosis Prostate cancer metastatic to bone [facility]) - Plan: CT Abdomen Pelvis W Wo Contrast, CT Chest W Contrast, NM Bone Scan Whole Body, CBC with Differential/Platelet, Comprehensive metabolic panel, Lactate dehydrogenase, ondansetron (ZOFRAN) 8 MG tablet, predniSONE (DELTASONE) 5 MG tablet, morphine (MS CONTIN) 60 MG 12 hr tablet, gabapentin (NEURONTIN) 300 MG capsule, DISCONTINUED: sodium chloride flush (NS) 0.9 % injection 10 mL, DISCONTINUED: heparin lock flush 100 unit/mL, DISCONTINUED: 0.9 %  sodium chloride infusion, DISCONTINUED: dexamethasone (DECADRON) injection 10 mg, DISCONTINUED: diphenhydrAMINE (BENADRYL) injection 25 mg, DISCONTINUED: famotidine (PEPCID) IVPB 20 mg premix, DISCONTINUED: cabazitaxel (JEVTANA) 36 mg in dextrose 5 % 250 mL chemo infusion, DISCONTINUED: pegfilgrastim (NEULASTA ONPRO KIT) injection 6 mg  Chemotherapy-induced peripheral neuropathy (HCC) - Plan: gabapentin (NEURONTIN) 300 MG capsule  Staging Cancer Staging Prostate cancer metastatic to bone [facility]) Staging form: Prostate, AJCC 7th Edition - Clinical: No stage assigned - Unsigned   Assessment and Plan:  1.  Prostate cancer metastatic to bone [facility]) 72 y.o. 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  Repeat PSA continues to increase and is 139.  He will continue Carbazitaxel and will be set up for CT and bone scan for interval evaluation.  He remains on lupron and Xgeva.  Recent imaging with bone scan and CT of the chest/abdomen/pelvis done 03/2017  Did not  show any gross progressive disease.  Labs adequate for chemotherapy.    2.  Bone mets.  Pt on MS Contin to 60 mg twice daily, add short-acting morphine 30 mg every 4 hours as needed.  Pt on Lupron and Xgeva.    Interval History:  72 y.o. 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      Prostate cancer metastatic to bone ([place])   10/01/2012 Initial Diagnosis    Prostate biopsied with highest Gleason score of 9 seen and the lowest score was 7.      10/04/2012 - 05/16/2013 Chemotherapy    Depo-Lupron and Casodex initiated      05/16/2013 -  Chemotherapy    Depo-Lupron monthly continued      05/16/2013 Progression    Progression by PSA elevation      05/16/2013 - 10/22/2014 Chemotherapy    Abiraterone and prednisone initiated in conjunction with ongoing Depo-Lupron.  Denosumab also ongoing.      10/23/2014 Progression    PSA increasing from 0.2- 1.6 in less than 6 months.        10/23/2014 - 01/30/2015 Chemotherapy    Enzalutamide and Prednisone (5 mg in AM and 2.5 mg in PM)      01/30/2015 Imaging    Bone scan- New focus of intense activity in right proximal humerus.  Interim increase in activity over left hip.      01/30/2015 Progression    Bone scan reveals new disease in right humerus consistent with progression of disease      01/31/2015 Imaging    Right humerus xray- blastic foci in proximal right humeral metaphysis and over right mid-humeral diaphysis.  No evidence of fracture      07/06/2015 Progression    Progression in multiple bones especially L hip and femurs      07/06/2015 Imaging    Bone scan- progressive multifocal osseous metastases in the right proximal femora and distal femoral shafts.  Stable update in bilateral ribs suspicious for small rib metastases  07/16/2015 - 07/31/2015 Radiation Therapy    Left femur 30 Gy in 10 fractions by Dr. Tammi Klippel      11/23/2015 - 01/04/2016 Chemotherapy    The patient had pegfilgrastim (NEULASTA ONPRO KIT) injection 6 mg, 6 mg, Subcutaneous, Once, 3 of 7 cycles  DOCEtaxel (TAXOTERE) 180 mg in dextrose 5 % 250 mL chemo infusion, 75 mg/m2 = 180 mg, Intravenous,  Once, 3 of 7 cycles Dose modification: 64 mg/m2 (original dose 75 mg/m2, Cycle 2, Reason: Dose not tolerated)  pegfilgrastim  (NEULASTA ONPRO KIT) injection 6 mg, 6 mg, Subcutaneous, Once, 0 of 4 cycles  cabazitaxel (JEVTANA) 60 mg in dextrose 5 % 250 mL chemo infusion, 25 mg/m2, Intravenous,  Once, 0 of 4 cycles  for chemotherapy treatment.        11/30/2015 Adverse Reaction    Diarrhea (secondary to chemotherapy) and dehydration requiring IV fluids      12/14/2015 Treatment Plan Change    Docetaxel dose reduced by 15%      12/31/2015 Procedure    Port placed by Dr. Arnoldo Morale      01/28/2016 -  Chemotherapy    Cabazitaxel (Jevtana)       03/27/2016 Imaging    CT Chest, Abdomen, and Pelvis with contrast 1. Diffuse sclerotic osseous metastatic disease in the chest, abdomen, and pelvis without acute fracture identified. 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 These appear chronic. 2. The prostate gland is normal in size and no adenopathy is currently identified. 3. On a prior MRI of 10/04/2012, there was a posterior right hepatic lobe lesion. This lesion is not currently visible on today' s CT. This could be due to differences in cons acuity between CT or MRI, or resolution of the lesion. 4. Coronary, aortic arch, and branch vessel atherosclerotic vascular disease. Aortoiliac atherosclerotic vascular disease. 5. Single bilateral renal cysts.      04/16/2016 Imaging    Bone scan- Findings consist with progressive metastatic disease. Activity over the proximal right humerus and proximal bilateral femurs are worrisome for the possible development of pathologic fractures.      08/21/2016 Imaging    CT C/A/P: IMPRESSION: No significant change since 03/27/2016 CT. Diffuse bony metastases without other evidence of metastatic disease.      08/21/2016 Imaging    Bone Scan: IMPRESSION: Multiple areas of increased activity again noted throughout the axial and appendicular skeleton in similar locations as prior exam. Intensity of uptake is increased from prior exam  suggesting progressive disease. Lesions present in the proximal humeri, proximal femurs, and the mid right femur susceptible to pathologic fracture.      12/02/2016 Imaging    CT C/A/P: IMPRESSION: 1. Overall stable appearance of diffuse osseous metastatic disease and resulting patchy sclerosis. 2. The patient had a posterior right hepatic lobe lesion on prior MRI from 2014 which is been relatively occult on CT. Given the lack of progression I suspect that this is benign or has been effectively treated. 3. Aortic Atherosclerosis (ICD10-I70.0). Coronary atherosclerosis with mild cardiomegaly. 4. Cholelithiasis. 5. Bilateral benign renal cysts. 6. 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      12/02/2016 Imaging    Bone Scan: IMPRESSION: 1. Widespread osseous metastatic disease. Multiplicity is similar to previous exam with interval increase in degree of tracer uptake associated with multiple lesions.  03/11/2017 Imaging    Bone Scan: Multifocal skeletal disease without sign of progression CT C/A/P: Diffuse sclerotic skeletal lesions, unchanged from the previous.  No new lymphadenopathy, pulmonary nodules, or hepatic lesions to suggest soft tissue progressive disease       Problem List Patient Active Problem List   Diagnosis Date Noted  . Gastroesophageal reflux disease [K21.9] 03/13/2016  . Prostate cancer metastatic to bone [facility]) [C61, C79.51] 09/05/2015  . Nausea [R11.0] 08/21/2015  . SOB (shortness of breath) on exertion [R06.02] 08/21/2015  . 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 Osteoarthritis of right knee [M17.11] 01/16/2014    Past Medical History Past Medical History:  Diagnosis Date  . Diabetes mellitus  without complication ([place])   . Hypertension   . Prostate cancer ([place]) 09/05/2015  . Prostate cancer metastatic to bone ([place]) 09/05/2015  . Sleep apnea   . Thyroid disease     Past Surgical History Past Surgical History:  Procedure Laterality Date  . CATARACT EXTRACTION    . PORTACATH PLACEMENT Left 12/31/2015   Procedure: INSERTION PORT-A-CATH LEFT SUBCLAVIAN;  Surgeon: Aviva Signs, MD;  Location: [facility];  Service: General;  Laterality: Left;  . REPLACEMENT TOTAL KNEE Left     Family History History reviewed. No pertinent family history.   Social History  reports that  has never smoked. he has never used smokeless tobacco. He reports that he drinks alcohol. He reports that he does not use drugs.  Medications  Current Outpatient Medications:  .  atenolol (TENORMIN) 100 MG tablet, Take 100 mg by mouth daily., Disp: , Rfl:  .  atorvastatin (LIPITOR) 20 MG tablet, Take 10 mg by mouth daily., Disp: , Rfl:  .  Cabazitaxel ([place] IV), Inject into the vein. Every 3 weeks, Disp: , Rfl:  .  calcium carbonate (OS-CAL - DOSED IN MG OF ELEMENTAL CALCIUM) 1250 (500 Ca) MG tablet, Take 2 tablets by mouth daily with breakfast. , Disp: , Rfl:  .  diltiazem (CARDIZEM CD) 300 MG 24 hr capsule, Take 300 mg by mouth daily., Disp: , Rfl:  .  esomeprazole (NEXIUM) 20 MG capsule, Take 1 capsule (20 mg total) by mouth daily at 12 noon., Disp: 30 capsule, Rfl: 5 .  gabapentin (NEURONTIN) 300 MG capsule, Take 1 capsule (300 mg total) by mouth 3 (three) times daily., Disp: 90 capsule, Rfl: 3 .  Glucosamine-Chondroit-Vit C-Mn (GLUCOSAMINE 1500 COMPLEX PO), Take 1 tablet by mouth 2 (two) times daily. , Disp: , Rfl:  .  KLOR-CON M20 20 MEQ tablet, TAKE 1 TABLET BY MOUTH ONCE DAILY, Disp: 90 tablet, Rfl: 1 .  levothyroxine (SYNTHROID, LEVOTHROID) 25 MCG tablet, Take 25 mcg by mouth daily before breakfast., Disp: , Rfl:  .  lidocaine-prilocaine (EMLA) cream, Apply to affected area once, Disp: 30 g, Rfl: 3 .   loperamide (IMODIUM) 1 MG/5ML solution, Take 1 mg by mouth as needed for diarrhea or loose stools., Disp: , Rfl:  .  magic mouthwash w/lidocaine SOLN, 1 part of each of the following: Benadryl 12.87m /512m Viscous lidocaine 2%, Maalox. Swish and swallow 5 mL QID., Disp: 240 mL, Rfl: 1 .  metFORMIN (GLUCOPHAGE) 500 MG tablet, Take 500 mg by mouth 2 (two) times daily with a meal., Disp: , Rfl:  .  Multiple Vitamin (MULTIVITAMIN WITH MINERALS) TABS tablet, Take 1 tablet by mouth daily., Disp: , Rfl:  .  Omega-3 Fatty Acids (FISH OIL) 1000 MG CAPS,  Take 1 capsule by mouth daily., Disp: , Rfl:  .  ondansetron (ZOFRAN) 8 MG tablet, Take 1 tablet (8 mg total) by mouth 2 (two) times daily as needed (Nausea or vomiting)., Disp: 30 tablet, Rfl: 1 .  Pegfilgrastim (NEULASTA ONPRO [place]), Inject into the skin. Every 21 days, Disp: , Rfl:  .  predniSONE (DELTASONE) 5 MG tablet, TAKE 1 TABLET BY MOUTH TWICE DAILY WITH  A  MEAL, Disp: 180 tablet, Rfl: 0 .  prochlorperazine (COMPAZINE) 10 MG tablet, Take 1 tablet (10 mg total) by mouth every 6 (six) hours as needed (Nausea or vomiting)., Disp: 30 tablet, Rfl: 1 .  senna-docusate (SENOKOT-S) 8.6-50 MG tablet, Take 2 tablets by mouth 2 (two) times daily., Disp: 120 tablet, Rfl: 3 .  tamsulosin (FLOMAX) 0.4 MG CAPS capsule, Take 0.8 mg by mouth daily. , Disp: , Rfl:  .  triamterene-hydrochlorothiazide (MAXZIDE-25) 37.5-25 MG tablet, Take 1 tablet by mouth daily. for high blood pressure, Disp: , Rfl: 3 .  vitamin B-12 (CYANOCOBALAMIN) 500 MCG tablet, Take by mouth., Disp: , Rfl:  .  morphine (MS CONTIN) 60 MG 12 hr tablet, Take 1 tablet (60 mg total) by mouth every 12 (twelve) hours., Disp: 60 tablet, Rfl: 0 .  morphine (MSIR) 30 MG tablet, Take 1 tablet (30 mg total) by mouth every 4 (four) hours as needed for up to 14 days for severe pain., Disp: 50 tablet, Rfl: 0 .  ondansetron (ZOFRAN-ODT) 8 MG disintegrating tablet, Take 1 tablet (8 mg total) by mouth every 8 (eight)  hours as needed for nausea or vomiting., Disp: 60 tablet, Rfl: 1 No current facility-administered medications for this visit.   Facility-Administered Medications Ordered in Other Visits:  .  leuprolide (LUPRON) injection 7.5 mg, 7.5 mg, Intramuscular, Q28 days, Kefalas, Thomas S, PA-C  Allergies Patient has no known allergies.  Review of Systems Review of Systems - Oncology ROS as per HPI otherwise 12 point ROS is negative.   Physical Exam  Vitals Wt Readings from Last 3 Encounters:  04/29/17 232 lb 12.8 oz (105.6 kg)  04/23/17 241 lb 8 oz (109.5 kg)  04/08/17 237 lb (107.5 kg)   Temp Readings from Last 3 Encounters:  05/01/17 98.1 F (36.7 C) ([place])  04/29/17 97.7 F (36.5 C) ([place])  04/23/17 98.3 F (36.8 C) ([place])   BP Readings from Last 3 Encounters:  05/01/17 (!) 150/69  04/29/17 (!) 159/73  04/23/17 (!) 157/80   Pulse Readings from Last 3 Encounters:  05/01/17 69  04/29/17 (!) 53  04/23/17 63   Constitutional: Well-developed, well-nourished, and in no distress.   HENT: Head: Normocephalic and atraumatic.  Mouth/Throat: No oropharyngeal exudate. Mucosa moist. Eyes: Pupils are equal, round, and reactive to light. Conjunctivae are normal. No scleral icterus.  Neck: Normal range of motion. Neck supple. No JVD present.  Cardiovascular: Normal rate, regular rhythm and normal heart sounds.  Exam reveals no gallop and no friction rub.   No murmur heard. Pulmonary/Chest: Effort normal and breath sounds normal. No respiratory distress. No wheezes.No rales.  Abdominal: Soft. Bowel sounds are normal. No distension. There is no tenderness. There is no guarding.  Musculoskeletal: No edema or tenderness.  Lymphadenopathy: No cervical or supraclavicular adenopathy.  Neurological: Alert and oriented to person, place, and time. No cranial nerve deficit.  Skin: Skin is warm and dry. No rash noted. No erythema. No pallor.  Psychiatric: Affect and judgment normal.    Labs Infusion on 04/23/2017  Component Date Value Ref Range  Status  . LDH 04/23/2017 243* 98 - 192 U/L Final   Performed at [facility], [address] [place], [place] [address]  Lab on 04/23/2017  Component Date Value Ref Range Status  . WBC 04/23/2017 10.6* 4.0 - 10.5 K/uL Final  . RBC 04/23/2017 3.74* 4.22 - 5.81 MIL/uL Final  . Hemoglobin 04/23/2017 11.4* 13.0 - 17.0 g/dL Final  . HCT 04/23/2017 35.6* 39.0 - 52.0 % Final  . MCV 04/23/2017 95.2  78.0 - 100.0 fL Final  . MCH 04/23/2017 30.5  26.0 - 34.0 pg Final  . MCHC 04/23/2017 32.0  30.0 - 36.0 g/dL Final  . RDW 04/23/2017 15.8* 11.5 - 15.5 % Final  . Platelets 04/23/2017 130* 150 - 400 K/uL Final  . Neutrophils Relative % 04/23/2017 87  % Final  . Neutro Abs 04/23/2017 9.3* 1.7 - 7.7 K/uL Final  . Lymphocytes Relative 04/23/2017 8  % Final  . Lymphs Abs 04/23/2017 0.8  0.7 - 4.0 K/uL Final  . Monocytes Relative 04/23/2017 4  % Final  . Monocytes Absolute 04/23/2017 0.5  0.1 - 1.0 K/uL Final  . Eosinophils Relative 04/23/2017 1  % Final  . Eosinophils Absolute 04/23/2017 0.1  0.0 - 0.7 K/uL Final  . Basophils Relative 04/23/2017 0  % Final  . Basophils Absolute 04/23/2017 0.0  0.0 - 0.1 K/uL Final   Performed at [facility], [address] [place], [place] [address]  . Sodium 04/23/2017 141  135 - 145 mmol/L Final  . Potassium 04/23/2017 3.5  3.5 - 5.1 mmol/L Final  . Chloride 04/23/2017 105  101 - 111 mmol/L Final  . CO2 04/23/2017 24  22 - 32 mmol/L Final  . Glucose, Bld 04/23/2017 150* 65 - 99 mg/dL Final  . BUN 04/23/2017 11  6 - 20 mg/dL Final  . Creatinine, Ser 04/23/2017 0.74  0.61 - 1.24 mg/dL Final  . Calcium 04/23/2017 8.5* 8.9 - 10.3 mg/dL Final  . Total Protein 04/23/2017 6.3* 6.5 - 8.1 g/dL Final  . Albumin 04/23/2017 3.7  3.5 - 5.0 g/dL Final  . AST 04/23/2017 32  15 - 41 U/L Final  . ALT 04/23/2017 18  17 - 63 U/L Final  . Alkaline Phosphatase 04/23/2017 182* 38 - 126 U/L Final  . Total Bilirubin  04/23/2017 0.7  0.3 - 1.2 mg/dL Final  . GFR calc non Af Amer 04/23/2017 >60  >60 mL/min Final  . GFR calc Af Amer 04/23/2017 >60  >60 mL/min Final   Comment: (NOTE) The eGFR has been calculated using the CKD EPI equation. This calculation has not been validated in all clinical situations. eGFR's persistently <60 mL/min signify possible Chronic Kidney Disease.   Georgiann Hahn gap 04/23/2017 12  5 - 15 Final   Performed at [facility], [address], [place], [place] [address]     Pathology Orders Placed This Encounter  Procedures  . CT Abdomen Pelvis W Wo Contrast    Standing Status:   Future    Standing Expiration Date:   05/21/2017    Order Specific Question:   If indicated for the ordered procedure, I authorize the administration of contrast media per Radiology protocol    Answer:   Yes    Order Specific Question:   Preferred imaging location?    Answer:   [facility]    Order Specific Question:   Radiology Contrast Protocol - do NOT remove file path    Answer:   \\[email or web address]  . CT Chest  W Contrast    Standing Status:   Future    Standing Expiration Date:   04/23/2018    Order Specific Question:   If indicated for the ordered procedure, I authorize the administration of contrast media per Radiology protocol    Answer:   Yes    Order Specific Question:   Preferred imaging location?    Answer:   [facility]    Order Specific Question:   Radiology Contrast Protocol - do NOT remove file path    Answer:   \\[email or web address]  . NM Bone Scan Whole Body    Standing Status:   Future    Standing Expiration Date:   04/23/2018    Order Specific Question:   If indicated for the ordered procedure, I authorize the administration of a radiopharmaceutical per Radiology protocol    Answer:   Yes    Order Specific Question:   Preferred imaging location?    Answer:   [facility]    Order Specific Question:   Radiology  Contrast Protocol - do NOT remove file path    Answer:   \\[email or web address]  . CBC with Differential/Platelet    Standing Status:   Future    Number of Occurrences:   1    Standing Expiration Date:   04/23/2018  . Comprehensive metabolic panel    Standing Status:   Future    Number of Occurrences:   1    Standing Expiration Date:   04/23/2018  . Lactate dehydrogenase    Standing Status:   Future    Number of Occurrences:   1    Standing Expiration Date:   04/23/2018       Zoila Shutter MD

## 2017-05-04 NOTE — Progress Notes (Signed)
I called and spoke with Dr. Retta Mac in [place], pt's dentist.  I advised him that patient received Xgeva on 2/21 and needed to wait until at least 3/18 or week of to have any dental extractions done.  I informed him that we would also be holding his Delton See for March per Dr. Mathis Dad Higgs here in our office and we will resume in April.  Appointments have been rescheduled for the patient and Dr. Reather Laurence office will call and notify pt.

## 2017-05-06 ENCOUNTER — Ambulatory Visit (HOSPITAL_COMMUNITY)
Admission: RE | Admit: 2017-05-06 | Discharge: 2017-05-06 | Disposition: A | Discharge status: Home / Self Care | Payer: Medicare Other | Source: Ambulatory Visit | Attending: Internal Medicine | Admitting: Internal Medicine

## 2017-05-06 ENCOUNTER — Encounter (HOSPITAL_COMMUNITY)
Admission: RE | Admit: 2017-05-06 | Discharge: 2017-05-06 | Disposition: A | Discharge status: Home / Self Care | Payer: Medicare Other | Source: Ambulatory Visit | Attending: Internal Medicine | Admitting: Internal Medicine

## 2017-05-06 ENCOUNTER — Encounter (HOSPITAL_COMMUNITY): Payer: Self-pay

## 2017-05-06 DIAGNOSIS — K769 Liver disease, unspecified: Secondary | ICD-10-CM | POA: Diagnosis not present

## 2017-05-06 DIAGNOSIS — I2510 Atherosclerotic heart disease of native coronary artery without angina pectoris: Secondary | ICD-10-CM | POA: Diagnosis not present

## 2017-05-06 DIAGNOSIS — K8020 Calculus of gallbladder without cholecystitis without obstruction: Secondary | ICD-10-CM | POA: Diagnosis not present

## 2017-05-06 DIAGNOSIS — C7951 Secondary malignant neoplasm of bone: Secondary | ICD-10-CM | POA: Insufficient documentation

## 2017-05-06 DIAGNOSIS — C61 Malignant neoplasm of prostate: Secondary | ICD-10-CM | POA: Diagnosis not present

## 2017-05-06 DIAGNOSIS — I700 Atherosclerosis of aorta: Secondary | ICD-10-CM | POA: Insufficient documentation

## 2017-05-06 MED ORDER — IOPAMIDOL (ISOVUE-300) INJECTION 61%
100.0000 mL | Freq: Once | INTRAVENOUS | Status: AC | PRN
  Administered 2017-05-06: 100 mL via INTRAVENOUS

## 2017-05-06 MED ORDER — TECHNETIUM TC 99M MEDRONATE IV KIT
25.0000 | PACK | Freq: Once | INTRAVENOUS | Status: AC | PRN
  Administered 2017-05-06: 22 via INTRAVENOUS

## 2017-05-07 ENCOUNTER — Telehealth (HOSPITAL_COMMUNITY): Payer: Self-pay

## 2017-05-07 NOTE — Telephone Encounter (Signed)
He has had x-rays and waiting to hear about those x-ray and then decide what he needs to do about PT. Will do exercises at home next week

## 2017-05-08 ENCOUNTER — Ambulatory Visit (HOSPITAL_COMMUNITY): Payer: Medicare Other

## 2017-05-11 ENCOUNTER — Encounter (HOSPITAL_COMMUNITY): Payer: Medicare Other

## 2017-05-12 DIAGNOSIS — H5703 Miosis: Secondary | ICD-10-CM | POA: Diagnosis not present

## 2017-05-12 DIAGNOSIS — H2512 Age-related nuclear cataract, left eye: Secondary | ICD-10-CM | POA: Diagnosis not present

## 2017-05-14 ENCOUNTER — Inpatient Hospital Stay (HOSPITAL_BASED_OUTPATIENT_CLINIC_OR_DEPARTMENT_OTHER): Payer: Medicare Other | Admitting: Hematology

## 2017-05-14 ENCOUNTER — Other Ambulatory Visit: Payer: Self-pay

## 2017-05-14 ENCOUNTER — Encounter (HOSPITAL_COMMUNITY): Payer: Self-pay | Admitting: Hematology

## 2017-05-14 VITALS — BP 150/81 | HR 58 | Temp 98.3°F | Resp 18 | Wt 227.4 lb

## 2017-05-14 DIAGNOSIS — E119 Type 2 diabetes mellitus without complications: Secondary | ICD-10-CM | POA: Diagnosis not present

## 2017-05-14 DIAGNOSIS — I10 Essential (primary) hypertension: Secondary | ICD-10-CM

## 2017-05-14 DIAGNOSIS — C61 Malignant neoplasm of prostate: Secondary | ICD-10-CM | POA: Diagnosis not present

## 2017-05-14 DIAGNOSIS — C7951 Secondary malignant neoplasm of bone: Secondary | ICD-10-CM

## 2017-05-14 DIAGNOSIS — Z5111 Encounter for antineoplastic chemotherapy: Secondary | ICD-10-CM | POA: Diagnosis not present

## 2017-05-14 DIAGNOSIS — C787 Secondary malignant neoplasm of liver and intrahepatic bile duct: Secondary | ICD-10-CM | POA: Diagnosis not present

## 2017-05-14 NOTE — Progress Notes (Signed)
[place], [place] [address]   CLINIC:  Medical Oncology/Hematology  PCP:  [name], MD [place] [address] [phone]   REASON FOR VISIT:  Follow-up for metastatic prostate cancer.  CURRENT THERAPY: Cabazitaxel q. 21 days.  BRIEF ONCOLOGIC HISTORY:    Prostate cancer metastatic to bone ([place])   10/01/2012 Initial Diagnosis    Prostate biopsied with highest Gleason score of 9 seen and the lowest score was 7.      10/04/2012 - 05/16/2013 Chemotherapy    Depo-Lupron and Casodex initiated      05/16/2013 -  Chemotherapy    Depo-Lupron monthly continued      05/16/2013 Progression    Progression by PSA elevation      05/16/2013 - 10/22/2014 Chemotherapy    Abiraterone and prednisone initiated in conjunction with ongoing Depo-Lupron.  Denosumab also ongoing.      10/23/2014 Progression    PSA increasing from 0.2- 1.6 in less than 6 months.        10/23/2014 - 01/30/2015 Chemotherapy    Enzalutamide and Prednisone (5 mg in AM and 2.5 mg in PM)      01/30/2015 Imaging    Bone scan- New focus of intense activity in right proximal humerus.  Interim increase in activity over left hip.      01/30/2015 Progression    Bone scan reveals new disease in right humerus consistent with progression of disease      01/31/2015 Imaging    Right humerus xray- blastic foci in proximal right humeral metaphysis and over right mid-humeral diaphysis.  No evidence of fracture      07/06/2015 Progression    Progression in multiple bones especially L hip and femurs      07/06/2015 Imaging    Bone scan- progressive multifocal osseous metastases in the right proximal femora and distal femoral shafts.  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 Tammi Klippel      11/23/2015 - 01/04/2016 Chemotherapy    The patient had pegfilgrastim  (NEULASTA ONPRO KIT) injection 6 mg, 6 mg, Subcutaneous, Once, 3 of 7 cycles  DOCEtaxel (TAXOTERE) 180 mg in dextrose 5 % 250 mL chemo infusion, 75 mg/m2 = 180 mg, Intravenous,  Once, 3 of 7 cycles Dose modification: 64 mg/m2 (original dose 75 mg/m2, Cycle 2, Reason: Dose not tolerated)  pegfilgrastim (NEULASTA ONPRO KIT) injection 6 mg, 6 mg, Subcutaneous, Once, 0 of 4 cycles  cabazitaxel (JEVTANA) 60 mg in dextrose 5 % 250 mL chemo infusion, 25 mg/m2, Intravenous,  Once, 0 of 4 cycles  for chemotherapy treatment.        11/30/2015 Adverse Reaction    Diarrhea (secondary to chemotherapy) and dehydration requiring IV fluids      12/14/2015 Treatment Plan Change    Docetaxel dose reduced by 15%      12/31/2015 Procedure    Port placed by Dr. Arnoldo Morale      01/28/2016 -  Chemotherapy    Cabazitaxel (Jevtana)       03/27/2016 Imaging    CT Chest, Abdomen, and Pelvis with contrast 1. Diffuse sclerotic osseous metastatic disease in the chest, abdomen, and pelvis without acute fracture identified. 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 These appear chronic. 2. The prostate gland is normal in size and  no adenopathy is currently identified. 3. On a prior MRI of 10/04/2012, there was a posterior right hepatic lobe lesion. This lesion is not currently visible on today' s CT. This could be due to differences in cons acuity between CT or MRI, or resolution of the lesion. 4. Coronary, aortic arch, and branch vessel atherosclerotic vascular disease. Aortoiliac atherosclerotic vascular disease. 5. Single bilateral renal cysts.      04/16/2016 Imaging    Bone scan- Findings consist with progressive metastatic disease. Activity over the proximal right humerus and proximal bilateral femurs are worrisome for the possible development of pathologic fractures.      08/21/2016 Imaging    CT C/A/P: IMPRESSION: No significant change since 03/27/2016 CT.  Diffuse bony metastases without other evidence of metastatic disease.      08/21/2016 Imaging    Bone Scan: IMPRESSION: Multiple areas of increased activity again noted throughout the axial and appendicular skeleton in similar locations as prior exam. Intensity of uptake is increased from prior exam suggesting progressive disease. Lesions present in the proximal humeri, proximal femurs, and the mid right femur susceptible to pathologic fracture.      12/02/2016 Imaging    CT C/A/P: IMPRESSION: 1. Overall stable appearance of diffuse osseous metastatic disease and resulting patchy sclerosis. 2. The patient had a posterior right hepatic lobe lesion on prior MRI from 2014 which is been relatively occult on CT. Given the lack of progression I suspect that this is benign or has been effectively treated. 3. Aortic Atherosclerosis (ICD10-I70.0). Coronary atherosclerosis with mild cardiomegaly. 4. Cholelithiasis. 5. Bilateral benign renal cysts. 6. 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      12/02/2016 Imaging    Bone Scan: IMPRESSION: 1. Widespread osseous metastatic disease. Multiplicity is similar to previous exam with interval increase in degree of tracer uptake associated with multiple lesions.      03/11/2017 Imaging    Bone Scan: Multifocal skeletal disease without sign of progression CT C/A/P: Diffuse sclerotic skeletal lesions, unchanged from the previous.  No new lymphadenopathy, pulmonary nodules, or hepatic lesions to suggest soft tissue progressive disease        CANCER STAGING: Cancer Staging Prostate cancer metastatic to bone [facility]) Staging form: Prostate, AJCC 7th Edition - Clinical: No stage assigned - Unsigned    INTERVAL HISTORY:  Mr. Meader 72 y.o. male returns for routine  follow-up and consideration for next cycle of chemotherapy.  He had a rough time lasting a few days after the last 3 cycles of chemotherapy.  He reports constant numbness in his feet and is taking gabapentin 300 twice daily.  He has numbness worsening at nighttime.  He denies any new onset abdominal pain.  He does have some fatigue but is able to do all his activities.  He denies any fevers or infections.  No weight loss was reported.    REVIEW OF SYSTEMS:  Review of Systems  Constitutional: Positive for diaphoresis and fatigue. Negative for appetite change, chills, fever and unexpected weight change.  HENT:  Negative.   Eyes: Negative.   Respiratory: Negative.   Cardiovascular: Negative.   Gastrointestinal: Negative.   Genitourinary: Negative.    Musculoskeletal: Negative.   Skin: Negative.   Neurological: Positive for numbness.  Psychiatric/Behavioral: Negative.      PAST MEDICAL/SURGICAL HISTORY:  Past Medical History:  Diagnosis Date  . Diabetes  mellitus without complication ([place])   . Hypertension   . Prostate cancer ([place]) 09/05/2015  . Prostate cancer metastatic to bone ([place]) 09/05/2015  . Sleep apnea   . Thyroid disease    Past Surgical History:  Procedure Laterality Date  . CATARACT EXTRACTION    . PORTACATH PLACEMENT Left 12/31/2015   Procedure: INSERTION PORT-A-CATH LEFT SUBCLAVIAN;  Surgeon: Aviva Signs, MD;  Location: [facility];  Service: General;  Laterality: Left;  . REPLACEMENT TOTAL KNEE Left      SOCIAL HISTORY:  Social History   Socioeconomic History  . Marital status: Married    Spouse name: Not on file  . Number of children: Not on file  . Years of education: Not on file  . Highest education level: Not on file  Social Needs  . Financial resource strain: Not on file  . Food insecurity - worry: Not on file  . Food insecurity - inability: Not on file  . Transportation needs - medical: Not on file  . Transportation needs - non-medical: Not on file    Occupational History  . Not on file  Tobacco Use  . Smoking status: Never Smoker  . Smokeless tobacco: Never Used  Substance and Sexual Activity  . Alcohol use: Yes    Comment: 1 beer each month  . Drug use: No  . Sexual activity: No    Comment: married  Other Topics Concern  . Not on file  Social History Narrative  . Not on file    FAMILY HISTORY:  History reviewed. No pertinent family history.  CURRENT MEDICATIONS:  Outpatient Encounter Medications as of 05/14/2017  Medication Sig  . atenolol (TENORMIN) 100 MG tablet Take 100 mg by mouth daily.  Marland Kitchen atorvastatin (LIPITOR) 20 MG tablet Take 10 mg by mouth daily.  . Cabazitaxel (JEVTANA IV) Inject into the vein. Every 3 weeks  . calcium carbonate (OS-CAL - DOSED IN MG OF ELEMENTAL CALCIUM) 1250 (500 Ca) MG tablet Take 2 tablets by mouth daily with breakfast.   . diltiazem (CARDIZEM CD) 300 MG 24 hr capsule Take 300 mg by mouth daily.  Marland Kitchen esomeprazole (NEXIUM) 20 MG capsule Take 1 capsule (20 mg total) by mouth daily at 12 noon.  . gabapentin (NEURONTIN) 300 MG capsule Take 1 capsule (300 mg total) by mouth 3 (three) times daily.  . Glucosamine-Chondroit-Vit C-Mn (GLUCOSAMINE 1500 COMPLEX PO) Take 1 tablet by mouth 2 (two) times daily.   Marland Kitchen KLOR-CON M20 20 MEQ tablet TAKE 1 TABLET BY MOUTH ONCE DAILY  . levothyroxine (SYNTHROID, LEVOTHROID) 25 MCG tablet Take 25 mcg by mouth daily before breakfast.  . lidocaine-prilocaine (EMLA) cream Apply to affected area once  . loperamide (IMODIUM) 1 MG/5ML solution Take 1 mg by mouth as needed for diarrhea or loose stools.  . magic mouthwash w/lidocaine SOLN 1 part of each of the following: Benadryl 12.23m /547m Viscous lidocaine 2%, Maalox. Swish and swallow 5 mL QID.  . Marland KitchenetFORMIN (GLUCOPHAGE) 500 MG tablet Take 500 mg by mouth 2 (two) times daily with a meal.  . morphine (MS CONTIN) 60 MG 12 hr tablet Take 1 tablet (60 mg total) by mouth every 12 (twelve) hours.  . Marland Kitchenorphine (MSIR) 30 MG  tablet Take 30 mg by mouth every 4 (four) hours as needed.   . Multiple Vitamin (MULTIVITAMIN WITH MINERALS) TABS tablet Take 1 tablet by mouth daily.  . Omega-3 Fatty Acids (FISH OIL) 1000 MG CAPS Take 1 capsule by mouth daily.  . ondansetron (ZOFRAN) 8  MG tablet Take 1 tablet (8 mg total) by mouth 2 (two) times daily as needed (Nausea or vomiting).  . ondansetron (ZOFRAN-ODT) 8 MG disintegrating tablet Take 1 tablet (8 mg total) by mouth every 8 (eight) hours as needed for nausea or vomiting.  . Pegfilgrastim (NEULASTA ONPRO [place]) Inject into the skin. Every 21 days  . predniSONE (DELTASONE) 5 MG tablet TAKE 1 TABLET BY MOUTH TWICE DAILY WITH  A  MEAL  . prochlorperazine (COMPAZINE) 10 MG tablet Take 1 tablet (10 mg total) by mouth every 6 (six) hours as needed (Nausea or vomiting).  Marland Kitchen senna-docusate (SENOKOT-S) 8.6-50 MG tablet Take 2 tablets by mouth 2 (two) times daily.  . tamsulosin (FLOMAX) 0.4 MG CAPS capsule Take 0.8 mg by mouth daily.   Marland Kitchen triamterene-hydrochlorothiazide (MAXZIDE-25) 37.5-25 MG tablet Take 1 tablet by mouth daily. for high blood pressure  . vitamin B-12 (CYANOCOBALAMIN) 500 MCG tablet Take by mouth.   Facility-Administered Encounter Medications as of 05/14/2017  Medication  . leuprolide (LUPRON) injection 7.5 mg    ALLERGIES:  No Known Allergies   PHYSICAL EXAM:  ECOG Performance status: 1  Vitals:   05/14/17 1432  BP: (!) 150/81  Pulse: (!) 58  Resp: 18  Temp: 98.3 F (36.8 C)  SpO2: 100%   Filed Weights   05/14/17 1432  Weight: 227 lb 6.4 oz (103.1 kg)    Physical Exam  Constitutional: He is oriented to person, place, and time.  Neck: Normal range of motion. Neck supple.  Cardiovascular: Normal rate and regular rhythm.  Pulmonary/Chest: Effort normal and breath sounds normal.  Abdominal: Soft. Bowel sounds are normal. There is no tenderness.  Musculoskeletal: Normal range of motion.  Neurological: He is alert and oriented to person, place, and  time.  Skin: Skin is warm and dry.  Psychiatric: Affect and judgment normal.     LABORATORY DATA:  I have reviewed the labs as listed.  CBC    Component Value Date/Time   WBC 30.5 (H) 05/01/2017 1406   RBC 3.87 (L) 05/01/2017 1406   HGB 11.5 (L) 05/01/2017 1406   HCT 34.8 (L) 05/01/2017 1406   PLT 176 05/01/2017 1406   MCV 89.9 05/01/2017 1406   MCH 29.7 05/01/2017 1406   MCHC 33.0 05/01/2017 1406   RDW 15.7 (H) 05/01/2017 1406   LYMPHSABS 0.6 (L) 05/01/2017 1406   MONOABS 0.6 05/01/2017 1406   EOSABS 0.0 05/01/2017 1406   BASOSABS 0.0 05/01/2017 1406   CMP Latest Ref Rng & Units 05/01/2017 04/29/2017 04/23/2017  Glucose 65 - 99 mg/dL 165(H) 173(H) 150(H)  BUN 6 - 20 mg/dL '11 11 11  ' Creatinine 0.61 - 1.24 mg/dL 0.82 0.74 0.74  Sodium 135 - 145 mmol/L 126(L) 139 141  Potassium 3.5 - 5.1 mmol/L 3.7 4.0 3.5  Chloride 101 - 111 mmol/L 88(L) 102 105  CO2 22 - 32 mmol/L '23 23 24  ' Calcium 8.9 - 10.3 mg/dL 9.0 9.6 8.5(L)  Total Protein 6.5 - 8.1 g/dL - 6.8 6.3(L)  Total Bilirubin 0.3 - 1.2 mg/dL - 0.5 0.7  Alkaline Phos 38 - 126 U/L - 139(H) 182(H)  AST 15 - 41 U/L - 32 32  ALT 17 - 63 U/L - 18 18     DIAGNOSTIC IMAGING: I have discussed the results of the bone scan which showed slight interval progression of metastatic disease with a possible new lesion in the left posterior aspect of the L3 vertebral body.  CT scan showed a 3 cm liver  lesion which is new.  There is also 1 subcentimeter liver lesion which is new.    ASSESSMENT & PLAN:  1. 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  We will discontinue cabazitaxel at this time.  He already received enzalutamide and abiraterone in the past.  He also received 2 cycles of docetaxel.  It was unclear whether docetaxel was discontinued secondary to progression or intolerance.  Patient thinks that his PSA went up and hence it was  discontinued.  He has only received 2 cycles of docetaxel, sometimes it is unlikely to see PSA response just after 2 cycles.  Given his new developments in the liver, I have recommended biopsy by interventional radiology.  This will give [place] some tissue to check for PDL 1 testing.  If PDL 1 is positive he could be a candidate for pembrolizumab.  I have also recommended checking BRCA1/2, and other mutations to see if he will be a candidate for olaparib.  He does not have any other family members with any other malignancies.  We will send [facility] Myrisk testing.  I will see him back 2-3 days after the liver biopsy.  There is a rare chance that it could transform into small cell cancer.  Orders placed this encounter:  Orders Placed This Encounter  Procedures  . CT Biopsy      Derek Jack MD [place] [phone]

## 2017-05-15 ENCOUNTER — Encounter (HOSPITAL_COMMUNITY): Payer: Medicare Other | Admitting: Physical Therapy

## 2017-05-15 ENCOUNTER — Encounter: Payer: Self-pay | Admitting: Hematology

## 2017-05-15 DIAGNOSIS — C61 Malignant neoplasm of prostate: Secondary | ICD-10-CM | POA: Diagnosis not present

## 2017-05-18 ENCOUNTER — Other Ambulatory Visit: Payer: Self-pay

## 2017-05-18 ENCOUNTER — Other Ambulatory Visit (HOSPITAL_COMMUNITY): Payer: Self-pay | Admitting: Emergency Medicine

## 2017-05-18 ENCOUNTER — Ambulatory Visit (HOSPITAL_COMMUNITY): Payer: Medicare Other | Attending: Internal Medicine

## 2017-05-18 ENCOUNTER — Encounter (HOSPITAL_COMMUNITY): Payer: Self-pay

## 2017-05-18 DIAGNOSIS — Z9181 History of falling: Secondary | ICD-10-CM | POA: Diagnosis not present

## 2017-05-18 DIAGNOSIS — M6281 Muscle weakness (generalized): Secondary | ICD-10-CM | POA: Insufficient documentation

## 2017-05-18 DIAGNOSIS — R2689 Other abnormalities of gait and mobility: Secondary | ICD-10-CM | POA: Insufficient documentation

## 2017-05-18 DIAGNOSIS — C7951 Secondary malignant neoplasm of bone: Principal | ICD-10-CM

## 2017-05-18 DIAGNOSIS — C61 Malignant neoplasm of prostate: Secondary | ICD-10-CM

## 2017-05-18 DIAGNOSIS — C787 Secondary malignant neoplasm of liver and intrahepatic bile duct: Secondary | ICD-10-CM

## 2017-05-18 NOTE — Therapy (Signed)
[place], [place], [address] Phone: [phone]   Fax:  [phone]  Physical Therapy Treatment/Re-Assessment  Patient Details  Name: Micheal Davis MRN: [id number] Date of Birth: 12-22-1945 Referring Provider: Creola Corn, MD   Encounter Date: 05/18/2017  PT End of Session - 05/18/17 0857    Visit Number  4    Number of Visits  13    Date for PT Re-Evaluation  06/01/17    Authorization Type  Medicare Part A and B    Authorization Time Period  04/20/17 - 06/01/17    Authorization - Visit Number  4    Authorization - Number of Visits  10    PT Start Time  0903    PT Stop Time  0942    PT Time Calculation (min)  39 min    Activity Tolerance  Patient tolerated treatment well    Behavior During Therapy  [facility] for tasks assessed/performed       Past Medical History:  Diagnosis Date  . Diabetes mellitus without complication ([place])   . Hypertension   . Prostate cancer ([place]) 09/05/2015  . Prostate cancer metastatic to bone ([place]) 09/05/2015  . Sleep apnea   . Thyroid disease     Past Surgical History:  Procedure Laterality Date  . CATARACT EXTRACTION    . PORTACATH PLACEMENT Left 12/31/2015   Procedure: INSERTION PORT-A-CATH LEFT SUBCLAVIAN;  Surgeon: Aviva Signs, MD;  Location: [facility];  Service: General;  Laterality: Left;  . REPLACEMENT TOTAL KNEE Left     There were no vitals filed for this visit.  Subjective Assessment - 05/18/17 0858    Subjective  Patient repots he is feeling much better today than he has the last several weeks. He states his MD informed him they had made a slight change in in chemotherapy medication and this is likely what has been making him very sick. He reports his cancer has also progressed to his liver and they are planning a biopsy. He also had cataract surgery on his left eye last week. He reports he has been trying to keep up with his HEP but it has been difficult while he has  been sick.    Pertinent History  Stage 4 prostate cancer, Left TKA    Limitations  Walking;House hold activities    How long can you sit comfortably?  unlimited    How long can you stand comfortably?  severalm inutes before he becomes too fatigued and needs to sit to rest    How long can you walk comfortably?  ~5 minutes before too tired to keep walking    Patient Stated Goals  get stronger and have more energy to walk or do activites for longer amounts of time    Currently in Pain?  No/denies         [facility] PT Assessment - 05/18/17 0001      Assessment   Medical Diagnosis  Gait and Balance abnormalities    Referring Provider  Creola Corn, MD      Functional Tests   Functional tests  Single leg stance;Other      Single Leg Stance   Comments  Rt LE = 5 seconds; Lt LE = 9 seconds was 0 seconds bil LE      Strength   Right Hip Flexion  4/5 was 4-/5    Right Hip Extension  4-/5 was 3+/5    Right Hip ABduction  4/5 was 4-/5  Left Hip Flexion  4-/5 was 3+/5    Left Hip Extension  4-/5 was 3+/5    Left Hip ABduction  4-/5 was 3+/5    Right Knee Flexion  4+/5    Right Knee Extension  5/5 was 4+/5    Left Knee Flexion  4/5 was 4-/5    Left Knee Extension  4/5 was 4/5    Right Ankle Dorsiflexion  5/5 was 4+/5    Left Ankle Dorsiflexion  4+/5 was 4/5      Standardized Balance Assessment   Standardized Balance Assessment  --        [facility] Adult PT Treatment/Exercise - 05/18/17 0001      Transfers   Five time sit to stand comments   15.89, No UE use was 18.6       Balance Exercises - 05/18/17 0916      Balance Exercises: Standing   Tandem Stance  Foam/compliant surface;Other reps (comment);30 secs 6 reps, alternating foot alighnment    Rockerboard  Lateral;EO;Intermittent UE support;Other (comment) 2x 1 minute    Step Over Hurdles / Cones  fwd/bkwd and lateral step over 6" hurdle; 2x 10 reps each direction    Marching Limitations  2x 1 minute, 3 second holds, solid  surface, 1HHA or fingertip support    Heel Raises Limitations  1x 20 reps    Toe Raise Limitations  1x 20 reps        PT Education - 05/18/17 0858    Education provided  Yes    Person(s) Educated  Patient    Methods  Explanation    Comprehension  Verbalized understanding       PT Short Term Goals - 05/18/17 0906      PT SHORT TERM GOAL #1   Title  Patient will be independent with HEP    Baseline  05/18/17 - limited by sickness secondary to cemotherapy    Time  3    Period  Weeks    Status  Partially Met      PT SHORT TERM GOAL #2   Title  Patient will improve MMT for limited groups by 1/2 grade to demonstrate improved functional strength for improved performance with stairs and gait.    Baseline  05/18/17 - all 1/2 improved    Time  3    Period  Weeks    Status  Achieved      PT SHORT TERM GOAL #3   Title  Patient will demosntrate improved balance by performing SLS for Bil LE for 10 seconds each to imprvoe performance to ascend/descend stairs with decreased risk of falling.    Baseline  05/18/17 - Rt EL = 5 sec, Lt LE =9 sec    Time  3    Period  Weeks    Status  Partially Met        PT Long Term Goals - 05/18/17 0347      PT LONG TERM GOAL #1   Title  Patient will improve MMT for limited groups by 1 grade to demonstrate improved functional strength for improved performance with stairs and gait.    Baseline  05/18/17 - all 1/2 improved    Time  6    Period  Weeks    Status  On-going      PT LONG TERM GOAL #2   Title  Patient will perform 5x sit to stand in = or <12 seconds to demonstrate improve functional LE strength and to decrease risk of  falling.    Baseline  05/18/17 - 16 seconds, no UE    Time  6    Period  Weeks    Status  On-going      PT LONG TERM GOAL #3   Title  Patient will score =>20/24 on the DGI to demonstrate significant improvement in balance during gait and decrease risk of falling.    Time  6    Period  Weeks    Status  On-going         Plan - 05/18/17 0857    Clinical Impression Statement  Re-assessment performed today and patient has partially met/met 3/3 short term goals and is progressing well towards long term goals. He has been limited in attendance to therapy secondary to sickness from his chemotherapy treatments. He arrived today feeling much better after a change in medication. Remainder of the session focused on balance interventions to improve single limb stance. Patient did not require as frequent of rest breaks today and denied an increase in pain. He will continue to benefit from skilled PT services to address ongoing impairments and improve functional mobility to decrease risk of falling.    Rehab Potential  Good    PT Frequency  2x / week    PT Duration  6 weeks    PT Treatment/Interventions  ADLs/Self Care Home Management;Cryotherapy;Electrical Stimulation;Moist Heat;Gait training;DME Instruction;Stair training;Functional mobility training;Therapeutic activities;Therapeutic exercise;Balance training;Neuromuscular re-education;Patient/family education;Energy conservation    PT Next Visit Plan  Continue proxmial hip strengthening and singel limb balance training, progressing as able. Perform step ups for strength next session and provide more rest breaks. Update HEP with marchign at counter, and sidelyign hip abduction.    PT Home Exercise Plan  Eval: bridge; 2/20: SLR, SLS    Consulted and Agree with Plan of Care  Patient       Patient will benefit from skilled therapeutic intervention in order to improve the following deficits and impairments:  Abnormal gait, Decreased activity tolerance, Decreased endurance, Decreased strength, Decreased balance, Decreased mobility, Difficulty walking, Obesity, Improper body mechanics, Impaired sensation  Visit Diagnosis: Other abnormalities of gait and mobility  Muscle weakness (generalized)  History of falling     Problem List Patient Active Problem List    Diagnosis Date Noted  . Gastroesophageal reflux disease 03/13/2016  . Prostate cancer metastatic to bone ([place]) 09/05/2015  . Nausea 08/21/2015  . SOB (shortness of breath) on exertion 08/21/2015  . 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 Osteoarthritis of right knee 01/16/2014    Kipp Brood, PT, DPT Physical Therapist with [facility]  05/18/2017 12:29 PM    [place], [place], [address] Phone: [phone]   Fax:  [phone]  Name: Gerold Sar MRN: [id number] Date of Birth: 1946/02/11

## 2017-05-19 ENCOUNTER — Telehealth (HOSPITAL_COMMUNITY): Payer: Self-pay | Admitting: Family Medicine

## 2017-05-19 NOTE — Telephone Encounter (Signed)
05/19/17  having abiopsy on Friday and he called to cx his therapy appt.

## 2017-05-20 ENCOUNTER — Ambulatory Visit (HOSPITAL_COMMUNITY): Payer: Medicare Other

## 2017-05-21 ENCOUNTER — Encounter (HOSPITAL_COMMUNITY): Payer: Self-pay

## 2017-05-21 ENCOUNTER — Other Ambulatory Visit (HOSPITAL_COMMUNITY): Payer: Medicare Other

## 2017-05-21 ENCOUNTER — Other Ambulatory Visit (HOSPITAL_COMMUNITY): Payer: Self-pay | Admitting: Adult Health

## 2017-05-21 ENCOUNTER — Inpatient Hospital Stay (HOSPITAL_COMMUNITY): Payer: Medicare Other

## 2017-05-21 ENCOUNTER — Encounter (HOSPITAL_COMMUNITY): Payer: Self-pay | Admitting: Adult Health

## 2017-05-21 ENCOUNTER — Other Ambulatory Visit: Payer: Self-pay | Admitting: Radiology

## 2017-05-21 VITALS — BP 153/79 | HR 58 | Temp 97.5°F | Resp 18

## 2017-05-21 DIAGNOSIS — C61 Malignant neoplasm of prostate: Secondary | ICD-10-CM

## 2017-05-21 DIAGNOSIS — C787 Secondary malignant neoplasm of liver and intrahepatic bile duct: Secondary | ICD-10-CM | POA: Diagnosis not present

## 2017-05-21 DIAGNOSIS — I10 Essential (primary) hypertension: Secondary | ICD-10-CM | POA: Diagnosis not present

## 2017-05-21 DIAGNOSIS — C7951 Secondary malignant neoplasm of bone: Secondary | ICD-10-CM | POA: Diagnosis not present

## 2017-05-21 DIAGNOSIS — E119 Type 2 diabetes mellitus without complications: Secondary | ICD-10-CM | POA: Diagnosis not present

## 2017-05-21 DIAGNOSIS — Z5111 Encounter for antineoplastic chemotherapy: Secondary | ICD-10-CM | POA: Diagnosis not present

## 2017-05-21 MED ORDER — MORPHINE SULFATE ER 60 MG PO TBCR: 60.0000 mg | EXTENDED_RELEASE_TABLET | Freq: Two times a day (BID) | ORAL | 0 refills | Status: DC

## 2017-05-21 MED ORDER — MORPHINE SULFATE 30 MG PO TABS: 30.0000 mg | ORAL_TABLET | ORAL | 0 refills | Status: DC | PRN

## 2017-05-21 MED ORDER — LEUPROLIDE ACETATE 7.5 MG IM KIT
7.5000 mg | PACK | INTRAMUSCULAR | Status: DC
  Administered 2017-05-21: 7.5 mg via INTRAMUSCULAR
  Filled 2017-05-21: qty 7.5

## 2017-05-21 NOTE — Progress Notes (Signed)
Micheal Davis held today due to dental visit this Friday. Patient rated pain 4 or 5 on pain scale today.  Pain medications refilled.    Patient tolerated lupron shot with no complaints voiced.  Injection site clean and dry with no bruising or swelling noted at site.  Band aid applied. VSS with discharge and left ambulatory with no s/s of distress noted.

## 2017-05-21 NOTE — Patient Instructions (Signed)
[place] at [facility]  Discharge Instructions:  You received a lupron shot today.  _______________________________________________________________  Thank you for choosing [place] at [facility] to provide your oncology and hematology care.  To afford each patient quality time with our providers, please arrive at least 15 minutes before your scheduled appointment.  You need to re-schedule your appointment if you arrive 10 or more minutes late.  We strive to give you quality time with our providers, and arriving late affects you and other patients whose appointments are after yours.  Also, if you no show three or more times for appointments you may be dismissed from the clinic.  Again, thank you for choosing [place] at [place] hope is that these requests will allow you access to exceptional care and in a timely manner. _______________________________________________________________  If you have questions after your visit, please contact our office at [phone] between the hours of 8:30 a.m. and 5:00 p.m. Voicemails left after 4:30 p.m. will not be returned until the following business day. _______________________________________________________________  For prescription refill requests, have your pharmacy contact our office. _______________________________________________________________  Recommendations made by the consultant and any test results will be sent to your referring physician. _______________________________________________________________

## 2017-05-21 NOTE — Progress Notes (Signed)
Patient presented to cancer center today for scheduled injection. Requesting refills of MS Contin and MSIR.   [place] Controlled Substance Reporting System reviewed and refill is appropriate on or after 05/27/17 for MS Contin; MSIR can be filled as of today, 05/21/17. Medication e-scribed to his pharmacy [facility]) using [employer] 2-step verification process.    [place] reviewed:     Mike Craze, NP [place] [phone]

## 2017-05-22 ENCOUNTER — Encounter (HOSPITAL_COMMUNITY): Payer: Medicare Other

## 2017-05-22 ENCOUNTER — Ambulatory Visit (HOSPITAL_COMMUNITY)
Admission: RE | Admit: 2017-05-22 | Discharge: 2017-05-22 | Disposition: A | Discharge status: Home / Self Care | Payer: Medicare Other | Source: Ambulatory Visit | Attending: Hematology | Admitting: Hematology

## 2017-05-22 ENCOUNTER — Encounter (HOSPITAL_COMMUNITY): Payer: Self-pay

## 2017-05-22 DIAGNOSIS — C787 Secondary malignant neoplasm of liver and intrahepatic bile duct: Secondary | ICD-10-CM | POA: Insufficient documentation

## 2017-05-22 DIAGNOSIS — C7951 Secondary malignant neoplasm of bone: Secondary | ICD-10-CM | POA: Insufficient documentation

## 2017-05-22 DIAGNOSIS — Z79899 Other long term (current) drug therapy: Secondary | ICD-10-CM | POA: Diagnosis not present

## 2017-05-22 DIAGNOSIS — Z8546 Personal history of malignant neoplasm of prostate: Secondary | ICD-10-CM | POA: Insufficient documentation

## 2017-05-22 DIAGNOSIS — K7689 Other specified diseases of liver: Secondary | ICD-10-CM | POA: Diagnosis not present

## 2017-05-22 DIAGNOSIS — K769 Liver disease, unspecified: Secondary | ICD-10-CM | POA: Diagnosis present

## 2017-05-22 DIAGNOSIS — G4730 Sleep apnea, unspecified: Secondary | ICD-10-CM | POA: Diagnosis not present

## 2017-05-22 DIAGNOSIS — K8020 Calculus of gallbladder without cholecystitis without obstruction: Secondary | ICD-10-CM | POA: Diagnosis not present

## 2017-05-22 DIAGNOSIS — E119 Type 2 diabetes mellitus without complications: Secondary | ICD-10-CM | POA: Insufficient documentation

## 2017-05-22 DIAGNOSIS — I700 Atherosclerosis of aorta: Secondary | ICD-10-CM | POA: Diagnosis not present

## 2017-05-22 DIAGNOSIS — Z7984 Long term (current) use of oral hypoglycemic drugs: Secondary | ICD-10-CM | POA: Insufficient documentation

## 2017-05-22 DIAGNOSIS — C61 Malignant neoplasm of prostate: Secondary | ICD-10-CM

## 2017-05-22 DIAGNOSIS — I10 Essential (primary) hypertension: Secondary | ICD-10-CM | POA: Insufficient documentation

## 2017-05-22 DIAGNOSIS — E079 Disorder of thyroid, unspecified: Secondary | ICD-10-CM | POA: Diagnosis not present

## 2017-05-22 LAB — CBC
HCT: 32.5 % — ABNORMAL LOW (ref 39.0–52.0)
HEMOGLOBIN: 10.3 g/dL — AB (ref 13.0–17.0)
MCH: 29.2 pg (ref 26.0–34.0)
MCHC: 31.7 g/dL (ref 30.0–36.0)
MCV: 92.1 fL (ref 78.0–100.0)
PLATELETS: 197 10*3/uL (ref 150–400)
RBC: 3.53 MIL/uL — AB (ref 4.22–5.81)
RDW: 16 % — ABNORMAL HIGH (ref 11.5–15.5)
WBC: 6.2 10*3/uL (ref 4.0–10.5)

## 2017-05-22 LAB — GLUCOSE, CAPILLARY: GLUCOSE-CAPILLARY: 135 mg/dL — AB (ref 65–99)

## 2017-05-22 LAB — PROTIME-INR
INR: 1.04
PROTHROMBIN TIME: 13.5 s (ref 11.4–15.2)

## 2017-05-22 MED ORDER — HYDROCODONE-ACETAMINOPHEN 5-325 MG PO TABS: 1.0000 | ORAL_TABLET | ORAL | Status: DC | PRN

## 2017-05-22 MED ORDER — GELATIN ABSORBABLE 12-7 MM EX MISC
CUTANEOUS | Status: AC
  Filled 2017-05-22: qty 1

## 2017-05-22 MED ORDER — MIDAZOLAM HCL 2 MG/2ML IJ SOLN
INTRAMUSCULAR | Status: AC
  Filled 2017-05-22: qty 4

## 2017-05-22 MED ORDER — FENTANYL CITRATE (PF) 100 MCG/2ML IJ SOLN
INTRAMUSCULAR | Status: AC
  Filled 2017-05-22: qty 4

## 2017-05-22 MED ORDER — SODIUM CHLORIDE 0.9 % IV SOLN
INTRAVENOUS | Status: DC
  Administered 2017-05-22: 14:00:00 via INTRAVENOUS

## 2017-05-22 MED ORDER — FENTANYL CITRATE (PF) 100 MCG/2ML IJ SOLN
INTRAMUSCULAR | Status: AC | PRN
  Administered 2017-05-22: 25 ug via INTRAVENOUS
  Administered 2017-05-22: 50 ug via INTRAVENOUS

## 2017-05-22 MED ORDER — MIDAZOLAM HCL 2 MG/2ML IJ SOLN
INTRAMUSCULAR | Status: AC | PRN
  Administered 2017-05-22: 1 mg via INTRAVENOUS
  Administered 2017-05-22: 0.5 mg via INTRAVENOUS

## 2017-05-22 MED ORDER — LIDOCAINE HCL (PF) 1 % IJ SOLN
INTRAMUSCULAR | Status: AC
  Filled 2017-05-22: qty 30

## 2017-05-22 NOTE — Sedation Documentation (Signed)
Patient is resting comfortably. 

## 2017-05-22 NOTE — Discharge Instructions (Addendum)
Liver Biopsy, Care After °Refer to this sheet in the next few weeks. These instructions provide you with information on caring for yourself after your procedure. Your health care provider may also give you more specific instructions. Your treatment has been planned according to current medical practices, but problems sometimes occur. Call your health care provider if you have any problems or questions after your procedure. °What can I expect after the procedure? °After your procedure, it is typical to have the following: °· A small amount of discomfort in the area where the biopsy was done and in the right shoulder or shoulder blade. °· A small amount of bruising around the area where the biopsy was done and on the skin over the liver. °· Sleepiness and fatigue for the rest of the day. ° °Follow these instructions at home: °· Rest at home for 1-2 days or as directed by your health care provider. °· Have a friend or family member stay with you for at least 24 hours. °· Because of the medicines used during the procedure, you should not do the following things in the first 24 hours: °? Drive. °? Use machinery. °? Be responsible for the care of other people. °? Sign legal documents. °? Take a bath or shower. °· There are many different ways to close and cover an incision, including stitches, skin glue, and adhesive strips. Follow your health care provider's instructions on: °? Incision care. °? Bandage (dressing) changes and removal. °? Incision closure removal. °· Do not drink alcohol in the first week. °· Do not lift more than 5 pounds or play contact sports for 2 weeks after this test. °· Take medicines only as directed by your health care provider. Do not take medicine containing aspirin or non-steroidal anti-inflammatory medicines such as ibuprofen for 1 week after this test. °· It is your responsibility to get your test results. °Contact a health care provider if: °· You have increased bleeding from an incision  that results in more than a small spot of blood. °· You have redness, swelling, or increasing pain in any incisions. °· You notice a discharge or a bad smell coming from any of your incisions. °· You have a fever or chills. °Get help right away if: °· You develop swelling, bloating, or pain in your abdomen. °· You become dizzy or faint. °· You develop a rash. °· You are nauseous or vomit. °· You have difficulty breathing, feel short of breath, or feel faint. °· You develop chest pain. °· You have problems with your speech or vision. °· You have trouble balancing or moving your arms or legs. °This information is not intended to replace advice given to you by your health care provider. Make sure you discuss any questions you have with your health care provider. °Document Released: 09/06/2004 Document Revised: 07/26/2015 Document Reviewed: 04/15/2013 °Elsevier Interactive Patient Education © 2018 [employer] °Moderate Conscious Sedation, Adult, Care After °These instructions provide you with information about caring for yourself after your procedure. Your health care provider may also give you more specific instructions. Your treatment has been planned according to current medical practices, but problems sometimes occur. Call your health care provider if you have any problems or questions after your procedure. °What can I expect after the procedure? °After your procedure, it is common: °· To feel sleepy for several hours. °· To feel clumsy and have poor balance for several hours. °· To have poor judgment for several hours. °· To vomit if you eat   too soon. ° °Follow these instructions at home: °For at least 24 hours after the procedure: ° °· Do not: °? Participate in activities where you could fall or become injured. °? Drive. °? Use heavy machinery. °? Drink alcohol. °? Take sleeping pills or medicines that cause drowsiness. °? Make important decisions or sign legal documents. °? Take care of children on your  own. °· Rest. °Eating and drinking °· Follow the diet recommended by your health care provider. °· If you vomit: °? Drink water, juice, or soup when you can drink without vomiting. °? Make sure you have little or no nausea before eating solid foods. °General instructions °· Have a responsible adult stay with you until you are awake and alert. °· Take over-the-counter and prescription medicines only as told by your health care provider. °· If you smoke, do not smoke without supervision. °· Keep all follow-up visits as told by your health care provider. This is important. °Contact a health care provider if: °· You keep feeling nauseous or you keep vomiting. °· You feel light-headed. °· You develop a rash. °· You have a fever. °Get help right away if: °· You have trouble breathing. °This information is not intended to replace advice given to you by your health care provider. Make sure you discuss any questions you have with your health care provider. °Document Released: 12/08/2012 Document Revised: 07/23/2015 Document Reviewed: 06/09/2015 °Elsevier Interactive Patient Education © 2018 [employer] ° °

## 2017-05-22 NOTE — H&P (Signed)
Chief Complaint: Patient was seen in consultation today for liver lesion biopsy at the request of [name]  Referring Physician(s): [name],[name]  Supervising Physician: [name]  Patient Status: Micheal Davis  History of Present Illness: Micheal Davis is a 72 y.o. male   Hx prostate cancer Rise in PSA Known bony lesions  New liver lesion  CT 05/06/17: IMPRESSION: 1. Ill-defined lesions in the liver are new and indicative of metastatic disease. 2. Diffuse sclerotic osseous metastatic disease, as before. 3. Aortic atherosclerosis (ICD10-170.0). Coronary artery calcification. 4. Cholelithiasis.  Bone scan same day IMPRESSION: Slight interval progression of osseous metastatic disease with a probable new lesion in the left posterior aspect of the L3 vertebral body, and increasing conspicuity of radiotracer activity within the left inferior scapula, proximal and distal right femur and mid left Femur.  Note of Dr [name] 05/24/17: ASSESSMENT & PLAN:  1. 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  We will discontinue cabazitaxel at this time.  He already received enzalutamide and abiraterone in the past.  He also received 2 cycles of docetaxel.  It was unclear whether docetaxel was discontinued secondary to progression or intolerance.  Patient thinks that his PSA went up and hence it was discontinued.  He has only received 2 cycles of docetaxel, sometimes it is unlikely to see PSA response just after 2 cycles.  Given his new developments in the liver, I have recommended biopsy by interventional radiology.  This will give [place] some tissue to check for PDL 1 testing.  If PDL 1 is positive he could be a candidate for pembrolizumab.  I have also recommended checking BRCA1/2, and other mutations to see if he will be a candidate for olaparib.  He does not have any  other family members with any other malignancies.  We will send [facility] Myrisk testing.  I will see him back 2-3 days after the liver biopsy.  There is a rare chance that it could transform into small cell cancer.   Scheduled now for liver lesion biopsy   Past Medical History:  Diagnosis Date  . Diabetes mellitus without complication ([place])   . Hypertension   . Prostate cancer ([place]) 09/05/2015  . Prostate cancer metastatic to bone ([place]) 09/05/2015  . Sleep apnea   . Thyroid disease     Past Surgical History:  Procedure Laterality Date  . CATARACT EXTRACTION    . PORTACATH PLACEMENT Left 12/31/2015   Procedure: INSERTION PORT-A-CATH LEFT SUBCLAVIAN;  Surgeon: Aviva Signs, MD;  Location: [facility];  Service: General;  Laterality: Left;  . REPLACEMENT TOTAL KNEE Left     Allergies: Patient has no known allergies.  Medications: Prior to Admission medications   Medication Sig Start Date End Date Taking? Authorizing Provider  atenolol (TENORMIN) 100 MG tablet Take 100 mg by mouth daily.   Yes [provider]  atorvastatin (LIPITOR) 20 MG tablet Take 10 mg by mouth daily.   Yes [provider]  calcium carbonate (OS-CAL - DOSED IN MG OF ELEMENTAL CALCIUM) 1250 (500 Ca) MG tablet Take 1 tablet by mouth 2 (two) times daily.    Yes [provider]  diltiazem (CARDIZEM CD) 300 MG 24 hr capsule Take 300 mg by mouth daily.   Yes [provider]  gabapentin (NEURONTIN) 300 MG capsule Take 1 capsule (300 mg total) by mouth 3 (three) times daily. Patient taking  differently: Take 300-600 mg by mouth See admin instructions. Take 300 mg by mouth in the morning and take 600 mg by mouth at bedtime 04/23/17  Yes Higgs, Mathis Dad, MD  Glucosamine-Chondroit-Vit C-Mn (GLUCOSAMINE 1500 COMPLEX PO) Take 1 tablet by mouth 2 (two) times daily.    Yes [provider]  KLOR-CON M20 20 MEQ tablet TAKE 1 TABLET BY MOUTH ONCE DAILY Patient taking differently: TAKE 20 MEQ BY MOUTH  ONCE DAILY 04/19/17  Yes Holley Bouche, NP  leuprolide (LUPRON) 7.5 MG injection Inject 7.5 mg into the muscle every 28 (twenty-eight) days.   Yes [provider]  levothyroxine (SYNTHROID, LEVOTHROID) 25 MCG tablet Take 25 mcg by mouth daily before breakfast.   Yes [provider]  loperamide (IMODIUM) 1 MG/5ML solution Take 1 mg by mouth as needed for diarrhea or loose stools.   Yes [provider]  Lysine 500 MG CAPS Take 500 mg by mouth daily.   Yes [provider]  magic mouthwash w/lidocaine SOLN 1 part of each of the following: Benadryl 12.57m /523m Viscous lidocaine 2%, Maalox. Swish and swallow 5 mL QID. Patient taking differently: Take 5 mLs by mouth 4 (four) times daily as needed for mouth pain. Benadryl 12.23m62m23ml30miscous lidocaine 2%, Maalox. 07/31/16  Yes ZhouTwana First  metFORMIN (GLUCOPHAGE) 500 MG tablet Take 500 mg by mouth 2 (two) times daily with a meal.   Yes [provider]  Multiple Vitamin (MULTIVITAMIN WITH MINERALS) TABS tablet Take 1 tablet by mouth daily.   Yes [provider]  Omega-3 Fatty Acids (FISH OIL PO) Take 1 capsule by mouth daily. 03/21/16  Yes [provider]  ondansetron (ZOFRAN) 8 MG tablet Take 1 tablet (8 mg total) by mouth 2 (two) times daily as needed (Nausea or vomiting). Patient taking differently: Take 8 mg by mouth 2 (two) times daily as needed for nausea or vomiting.  04/23/17  Yes Higgs, VettMathis Dad  ondansetron (ZOFRAN-ODT) 8 MG disintegrating tablet Take 1 tablet (8 mg total) by mouth every 8 (eight) hours as needed for nausea or vomiting. 04/30/17  Yes DawsHolley Bouche  predniSONE (DELTASONE) 5 MG tablet Take 5 mg by mouth 2 (two) times daily with a meal.   Yes [provider]  prochlorperazine (COMPAZINE) 10 MG tablet Take 1 tablet (10 mg total) by mouth every 6 (six) hours as needed (Nausea or vomiting). Patient taking differently: Take 10 mg by mouth every 6 (six)  hours as needed for nausea or vomiting.  01/16/16  Yes Penland, ShanKelby Fam  tamsulosin (FLOMAX) 0.4 MG CAPS capsule Take 0.8 mg by mouth daily.    Yes [provider]  triamterene-hydrochlorothiazide (MAXZIDE-25) 37.5-25 MG tablet Take 1 tablet by mouth daily. for high blood pressure 03/25/16  Yes [provider]  denosumab (XGEVA) 120 MG/1.7ML SOLN injection Inject 120 mg into the skin every 28 (twenty-eight) days.    [provider]  esomeprazole (NEXIUM) 20 MG capsule Take 1 capsule (20 mg total) by mouth daily at 12 noon. Patient not taking: Reported on 05/18/2017 03/13/16   [facility]  lidocaine-prilocaine (EMLA) cream Apply to affected area once Patient not taking: Reported on 05/18/2017 01/16/16   Penland, ShanKelby Fam  morphine (MS CONTIN) 60 MG 12 hr tablet Take 1 tablet (60 mg total) by mouth every 12 (twelve) hours. 05/21/17 06/20/17  DawsHolley Bouche  morphine (MSIR) 30 MG tablet Take 1 tablet (30 mg total) by  mouth every 4 (four) hours as needed for severe pain. 05/21/17   Holley Bouche, NP  senna-docusate (SENOKOT-S) 8.6-50 MG tablet Take 2 tablets by mouth 2 (two) times daily. Patient not taking: Reported on 05/18/2017 03/18/17 07/16/17  Ardath Sax, MD     History reviewed. No pertinent family history.  Social History   Socioeconomic History  . Marital status: Married    Spouse name: Not on file  . Number of children: Not on file  . Years of education: Not on file  . Highest education level: Not on file  Occupational History  . Not on file  Social Needs  . Financial resource strain: Not on file  . Food insecurity:    Worry: Not on file    Inability: Not on file  . Transportation needs:    Medical: Not on file    Non-medical: Not on file  Tobacco Use  . Smoking status: Never Smoker  . Smokeless tobacco: Never Used  Substance and Sexual Activity  . Alcohol use: Yes    Comment: 1 beer each month  . Drug use: No    . Sexual activity: Never    Comment: married  Lifestyle  . Physical activity:    Days per week: Not on file    Minutes per session: Not on file  . Stress: Not on file  Relationships  . Social connections:    Talks on phone: Not on file    Gets together: Not on file    Attends religious service: Not on file    Active member of club or organization: Not on file    Attends meetings of clubs or organizations: Not on file    Relationship status: Not on file  Other Topics Concern  . Not on file  Social History Narrative  . Not on file       Review of Systems: A 12 point ROS discussed and pertinent positives are indicated in the HPI above.  All other systems are negative.  Review of Systems  Constitutional: Positive for fatigue. Negative for activity change and fever.  Respiratory: Negative for cough and shortness of breath.   Gastrointestinal: Negative for abdominal pain.  Psychiatric/Behavioral: Negative for behavioral problems and confusion.    Vital Signs: BP (!) 159/81 (BP Location: Right Arm)   Pulse (!) 58   Temp 97.7 F (36.5 C) (Oral)   Ht _0  (1.778 m)   Wt 223 lb (101.2 kg)   SpO2 99%   BMI 32.00 kg/m   Physical Exam  Constitutional: He is oriented to person, place, and time.  Cardiovascular: Normal rate, regular rhythm and normal heart sounds.  Pulmonary/Chest: Effort normal and breath sounds normal.  Abdominal: Soft. Bowel sounds are normal.  Musculoskeletal: Normal range of motion.  Neurological: He is alert and oriented to person, place, and time.  Skin: Skin is warm and dry.  Psychiatric: He has a normal mood and affect. His behavior is normal. Judgment and thought content normal.  Nursing note and vitals reviewed.   Imaging: Ct Abdomen Pelvis W Wo Contrast  Result Date: 05/07/2017 CLINICAL DATA:  Metastatic prostate cancer with ongoing chemotherapy. Rising PSA. EXAM: CT CHEST, ABDOMEN, AND PELVIS WITH CONTRAST TECHNIQUE: Multidetector CT  imaging of the chest, abdomen and pelvis was performed following the standard protocol during bolus administration of intravenous contrast. CONTRAST:  13m ISOVUE-300 IOPAMIDOL (ISOVUE-300) INJECTION 61% COMPARISON:  Bone scan 05/06/2017 and CT chest abdomen pelvis 03/12/2017, 03/27/2016. Abdominal MR 10/04/2012. FINDINGS: CT  CHEST FINDINGS Cardiovascular: Left IJ Port-A-Cath terminates in the SVC. Atherosclerotic calcification of the arterial vasculature, including coronary arteries. Heart is mildly enlarged. No pericardial effusion. Mediastinum/Nodes: No pathologically enlarged mediastinal, hilar or axillary lymph nodes. Esophagus is grossly unremarkable. Lungs/Pleura: Smudgy 4 mm nodules along the fissures are indicative of subpleural lymph nodes. Linear scarring in the anterior right lower lobe. 3 mm right lower lobe nodule (image 111), stable. Additional subpleural nodules measure 4 mm or less in size, also stable. No pleural fluid. Airway is unremarkable. Musculoskeletal: Mottled sclerosis is seen throughout the visualized osseous structures, as before. Old right rib fractures. CT ABDOMEN PELVIS FINDINGS Hepatobiliary: There is a new poorly defined low-attenuation lesion in segment 4 of the liver, measuring approximately 3.0 cm (series 2, image 57). A second ill-defined low-attenuation lesion in the posterior right hepatic lobe measures approximately 8 mm (series 2, image 60). Noncalcified gallstone. No biliary ductal dilatation. Pancreas: Negative. Spleen: Negative. Adrenals/Urinary Tract: Adrenal glands are unremarkable. Fluid density 1.9 cm in lesion in the interpolar right kidney is indicative of a cyst. 11 mm lesion in the interpolar left kidney measures 21 Hounsfield units and is grossly stable from 10/04/2012, favoring a hyperdense cyst. Ureters are decompressed. There may be anterior bladder wall thickening. Stomach/Bowel: Stomach, small bowel, appendix and colon are unremarkable. Vascular/Lymphatic:  Atherosclerotic calcification of the arterial vasculature without abdominal aortic aneurysm. No pathologically enlarged lymph nodes. Tiny retroperitoneal lymph nodes in the left periaortic station and left iliac chain are unchanged. Reproductive: Prostate is visualized. Other: No free fluid.  Mesenteries and peritoneum are unremarkable. Musculoskeletal: Mottled sclerosis is seen throughout the visualized osseous structures, as before. Sclerotic bilateral L5 pars defects with grade 1 anterolisthesis and secondary degenerative disc disease. IMPRESSION: 1. Ill-defined lesions in the liver are new and indicative of metastatic disease. 2. Diffuse sclerotic osseous metastatic disease, as before. 3. Aortic atherosclerosis (ICD10-170.0). Coronary artery calcification. 4. Cholelithiasis. Electronically Signed   By: Lorin Picket M.D.   On: 05/07/2017 08:27   Ct Chest W Contrast  Result Date: 05/07/2017 CLINICAL DATA:  Metastatic prostate cancer with ongoing chemotherapy. Rising PSA. EXAM: CT CHEST, ABDOMEN, AND PELVIS WITH CONTRAST TECHNIQUE: Multidetector CT imaging of the chest, abdomen and pelvis was performed following the standard protocol during bolus administration of intravenous contrast. CONTRAST:  166m ISOVUE-300 IOPAMIDOL (ISOVUE-300) INJECTION 61% COMPARISON:  Bone scan 05/06/2017 and CT chest abdomen pelvis 03/12/2017, 03/27/2016. Abdominal MR 10/04/2012. FINDINGS: CT CHEST FINDINGS Cardiovascular: Left IJ Port-A-Cath terminates in the SVC. Atherosclerotic calcification of the arterial vasculature, including coronary arteries. Heart is mildly enlarged. No pericardial effusion. Mediastinum/Nodes: No pathologically enlarged mediastinal, hilar or axillary lymph nodes. Esophagus is grossly unremarkable. Lungs/Pleura: Smudgy 4 mm nodules along the fissures are indicative of subpleural lymph nodes. Linear scarring in the anterior right lower lobe. 3 mm right lower lobe nodule (image 111), stable. Additional  subpleural nodules measure 4 mm or less in size, also stable. No pleural fluid. Airway is unremarkable. Musculoskeletal: Mottled sclerosis is seen throughout the visualized osseous structures, as before. Old right rib fractures. CT ABDOMEN PELVIS FINDINGS Hepatobiliary: There is a new poorly defined low-attenuation lesion in segment 4 of the liver, measuring approximately 3.0 cm (series 2, image 57). A second ill-defined low-attenuation lesion in the posterior right hepatic lobe measures approximately 8 mm (series 2, image 60). Noncalcified gallstone. No biliary ductal dilatation. Pancreas: Negative. Spleen: Negative. Adrenals/Urinary Tract: Adrenal glands are unremarkable. Fluid density 1.9 cm in lesion in the interpolar right kidney is indicative of a  cyst. 11 mm lesion in the interpolar left kidney measures 21 Hounsfield units and is grossly stable from 10/04/2012, favoring a hyperdense cyst. Ureters are decompressed. There may be anterior bladder wall thickening. Stomach/Bowel: Stomach, small bowel, appendix and colon are unremarkable. Vascular/Lymphatic: Atherosclerotic calcification of the arterial vasculature without abdominal aortic aneurysm. No pathologically enlarged lymph nodes. Tiny retroperitoneal lymph nodes in the left periaortic station and left iliac chain are unchanged. Reproductive: Prostate is visualized. Other: No free fluid.  Mesenteries and peritoneum are unremarkable. Musculoskeletal: Mottled sclerosis is seen throughout the visualized osseous structures, as before. Sclerotic bilateral L5 pars defects with grade 1 anterolisthesis and secondary degenerative disc disease. IMPRESSION: 1. Ill-defined lesions in the liver are new and indicative of metastatic disease. 2. Diffuse sclerotic osseous metastatic disease, as before. 3. Aortic atherosclerosis (ICD10-170.0). Coronary artery calcification. 4. Cholelithiasis. Electronically Signed   By: Lorin Picket M.D.   On: 05/07/2017 08:27   Nm Bone  Scan Whole Body  Result Date: 05/06/2017 CLINICAL DATA:  72 year old male with a history of prostate cancer metastatic to bone. EXAM: NUCLEAR MEDICINE WHOLE BODY BONE SCAN TECHNIQUE: Whole body anterior and posterior images were obtained approximately 3 hours after intravenous injection of radiopharmaceutical. RADIOPHARMACEUTICALS:  Twenty-two mCi Technetium-29mMDP IV COMPARISON:  Most recent prior bone scan 03/11/2017 FINDINGS: Multiple sites of abnormal increased osseous tracer accumulation are again identified consistent with diffuse osseous metastatic disease. These include calvarium, cervical spine, thoracic spine, anterior and posterior ribs, sternum, BILATERAL proximal humeri, mid RIGHT humeral diaphysis, pelvis, sacrum, proximal BILATERAL femora, mid LEFT femur, distal RIGHT femur, left scapula. Slightly increased conspicuity of radiotracer activity in the right distal femur and left mid femur. Additionally, the radiotracer activity in the proximal right femur is more intense than previously noted. Possible new focus of radiotracer activity in the left posterior aspect of the L3 vertebral body. Degenerative type uptake at RIGHT knee and LEFT foot. Expected urinary tract and soft tissue distribution of tracer. IMPRESSION: Slight interval progression of osseous metastatic disease with a probable new lesion in the left posterior aspect of the L3 vertebral body, and increasing conspicuity of radiotracer activity within the left inferior scapula, proximal and distal right femur and mid left femur. Electronically Signed   By: HJacqulynn CadetM.D.   On: 05/06/2017 21:07    Labs:  CBC: Recent Labs    04/08/17 0812 04/23/17 0901 04/29/17 0922 05/01/17 1406  WBC 7.7 10.6* 6.1 30.5*  HGB 11.4* 11.4* 12.3* 11.5*  HCT 35.7* 35.6* 38.8* 34.8*  PLT 222 130* 199 176    COAGS: No results for input(s): INR, APTT in the last 8760 hours.  BMP: Recent Labs    04/08/17 0812 04/23/17 0901  04/29/17 0922 05/01/17 1406  NA 137 141 139 126*  K 4.2 3.5 4.0 3.7  CL 100* 105 102 88*  CO2 _0 GLUCOSE 133* 150* 173* 165*  BUN _1 CALCIUM 9.2 8.5* 9.6 9.0  CREATININE 0.73 0.74 0.74 0.82  GFRNONAA >60 >60 >60 >60  GFRAA >60 >60 >60 >60    LIVER FUNCTION TESTS: Recent Labs    03/26/17 0906 04/08/17 0812 04/23/17 0901 04/29/17 0922  BILITOT 0.8 0.9 0.7 0.5  AST 24 25 32 32  ALT 19 15* 18 18  ALKPHOS 172* 143* 182* 139*  PROT 6.7 6.7 6.3* 6.8  ALBUMIN 4.0 3.6 3.7 3.9    TUMOR MARKERS: No results for input(s): AFPTM, CEA, CA199, CHROMGRNA in the last 8760  hours.  Assessment and Plan:  Prostate ca Rising PSA Known bony lesions and new liver lesion Scheduled for liver lesion bx Risks and benefits discussed with the patient including, but not limited to bleeding, infection, damage to adjacent structures or low yield requiring additional tests.  All of the patient's questions were answered, patient is agreeable to proceed. Consent signed and in chart.   Thank you for this interesting consult.  I greatly enjoyed meeting Micheal Davis and look forward to participating in their care.  A copy of this report was sent to the requesting provider on this date.  Electronically Signed: Lavonia Drafts, PA-C 05/22/2017, 12:11 PM   I spent a total of  30 Minutes   in face to face in clinical consultation, greater than 50% of which was counseling/coordinating care for liver lesion bx

## 2017-05-22 NOTE — Procedures (Signed)
US guided core biopsies of a liver lesion (segment 4).  3 cores obtained.  No immediate complication.  Minimal blood loss.

## 2017-05-22 NOTE — Sedation Documentation (Signed)
Patient denies pain and is resting comfortably.  

## 2017-05-25 ENCOUNTER — Other Ambulatory Visit (HOSPITAL_COMMUNITY): Payer: Medicare Other

## 2017-05-25 ENCOUNTER — Ambulatory Visit (HOSPITAL_COMMUNITY): Payer: Medicare Other

## 2017-05-25 ENCOUNTER — Encounter (HOSPITAL_COMMUNITY): Payer: Self-pay

## 2017-05-25 ENCOUNTER — Other Ambulatory Visit: Payer: Self-pay

## 2017-05-25 DIAGNOSIS — R2689 Other abnormalities of gait and mobility: Secondary | ICD-10-CM

## 2017-05-25 DIAGNOSIS — Z9181 History of falling: Secondary | ICD-10-CM

## 2017-05-25 DIAGNOSIS — M6281 Muscle weakness (generalized): Secondary | ICD-10-CM | POA: Diagnosis not present

## 2017-05-25 NOTE — Therapy (Signed)
[place], [place], [address] Phone: [phone]   Fax:  [phone]  Physical Therapy Treatment  Patient Details  Name: Micheal Davis MRN: [id number] Date of Birth: 1945-05-16 Referring Provider: Creola Corn, MD   Encounter Date: 05/25/2017  PT End of Session - 05/25/17 0831    Visit Number  5    Number of Visits  13    Date for PT Re-Evaluation  06/01/17    Authorization Type  Medicare Part A and B    Authorization Time Period  04/20/17 - 06/01/17    Authorization - Visit Number  4    Authorization - Number of Visits  10    PT Start Time  0816    PT Stop Time  0856    PT Time Calculation (min)  40 min    Activity Tolerance  Patient tolerated treatment well    Behavior During Therapy  [facility] for tasks assessed/performed       Past Medical History:  Diagnosis Date  . Diabetes mellitus without complication ([place])   . Hypertension   . Prostate cancer ([place]) 09/05/2015  . Prostate cancer metastatic to bone ([place]) 09/05/2015  . Sleep apnea   . Thyroid disease     Past Surgical History:  Procedure Laterality Date  . CATARACT EXTRACTION    . PORTACATH PLACEMENT Left 12/31/2015   Procedure: INSERTION PORT-A-CATH LEFT SUBCLAVIAN;  Surgeon: Aviva Signs, MD;  Location: [facility];  Service: General;  Laterality: Left;  . REPLACEMENT TOTAL KNEE Left     There were no vitals filed for this visit.  Subjective Assessment - 05/25/17 0824    Subjective  Patient reports he had his biopsy  performed on Friday and it went well. He was instructed to not lift anything heavier than 5lbs and to monitor his abdomen for signs of swelling or nauseau, vomitting, or changes in mental status. He states he felt rough on Saturday but is feeling better now. He reports he did not participate in his HEP this past weekend after the biopsy. He should finds out the results by this comign Friday.    Pertinent History  Stage 4 prostate cancer,  Left TKA    Limitations  Walking;House hold activities    How long can you sit comfortably?  unlimited    How long can you stand comfortably?  severalm inutes before he becomes too fatigued and needs to sit to rest    How long can you walk comfortably?  ~5 minutes before too tired to keep walking    Patient Stated Goals  get stronger and have more energy to walk or do activites for longer amounts of time    Currently in Pain?  No/denies        No data recorded    [facility] Adult PT Treatment/Exercise - 05/25/17 0001      Knee/Hip Exercises: Standing   Heel Raises  Both;1 set;20 reps;Limitations    Heel Raises Limitations  1x 20 reps Toe raises    Lateral Step Up  Both;10 reps;Step Height: 4"    Forward Step Up  Both;10 reps;Step Height: 4"      Knee/Hip Exercises: Seated   Sit to Sand  10 reps;without UE support;2 sets       Balance Exercises - 05/25/17 0818      Balance Exercises: Standing   Tandem Stance  Foam/compliant surface;Other reps (comment);30 secs 6 reps alternating feet    Standing, One Foot  on a Step  Eyes open;4 inch;20 secs;Limitations;4 reps 4 reps Bil LE    Rockerboard  Lateral;EO;Intermittent UE support;Other (comment) 2x 1 minute    Marching Limitations  2x 10 reps, 56 second holds, solid surface        PT Education - 05/25/17 0831    Education provided  Yes    Education Details  educated on HEPp update and exercises throughout    Person(s) Educated  Patient    Methods  Explanation    Comprehension  Verbalized understanding       PT Short Term Goals - 05/18/17 0906      PT SHORT TERM GOAL #1   Title  Patient will be independent with HEP    Baseline  05/18/17 - limited by sickness secondary to cemotherapy    Time  3    Period  Weeks    Status  Partially Met      PT SHORT TERM GOAL #2   Title  Patient will improve MMT for limited groups by 1/2 grade to demonstrate improved functional strength for improved performance with stairs and gait.     Baseline  05/18/17 - all 1/2 improved    Time  3    Period  Weeks    Status  Achieved      PT SHORT TERM GOAL #3   Title  Patient will demosntrate improved balance by performing SLS for Bil LE for 10 seconds each to imprvoe performance to ascend/descend stairs with decreased risk of falling.    Baseline  05/18/17 - Rt EL = 5 sec, Lt LE =9 sec    Time  3    Period  Weeks    Status  Partially Met        PT Long Term Goals - 05/18/17 8250      PT LONG TERM GOAL #1   Title  Patient will improve MMT for limited groups by 1 grade to demonstrate improved functional strength for improved performance with stairs and gait.    Baseline  05/18/17 - all 1/2 improved    Time  6    Period  Weeks    Status  On-going      PT LONG TERM GOAL #2   Title  Patient will perform 5x sit to stand in = or <12 seconds to demonstrate improve functional LE strength and to decrease risk of falling.    Baseline  05/18/17 - 16 seconds, no UE    Time  6    Period  Weeks    Status  On-going      PT LONG TERM GOAL #3   Title  Patient will score =>20/24 on the DGI to demonstrate significant improvement in balance during gait and decrease risk of falling.    Time  6    Period  Weeks    Status  On-going        Plan - 05/25/17 0831    Clinical Impression Statement  Patient progressed bil LE functional strengthening today with greater repetition for exercises and had no increase in pain. HE reported bil LE fatigue stating "feeling rubbery" after sit to stands and step ups. He demonstrated good form with new HEP exercises for balance training this session and advance SLS with contralateral foot on step in back. He continues to demonstrate good motivation and decreased need of rest breaks. He will continue to benefit from skilled PT services to address ongoing impairments and improve functional mobility to decrease risk of  falling.    Rehab Potential  Good    PT Frequency  2x / week    PT Duration  6 weeks    PT  Treatment/Interventions  ADLs/Self Care Home Management;Cryotherapy;Electrical Stimulation;Moist Heat;Gait training;DME Instruction;Stair training;Functional mobility training;Therapeutic activities;Therapeutic exercise;Balance training;Neuromuscular re-education;Patient/family education;Energy conservation    PT Next Visit Plan  Ask patient if he will need more rest breaks and add more PRN throughotu sessions. Continue proxmial hip strengthening and singel limb balance training, progressing as able. Perform step ups for strength next session. Update HEP with sidelyign hip abduction.    PT Home Exercise Plan  Eval: bridge; 2/20: SLR, SLS; 05/25/17 - marching at counter    Consulted and Agree with Plan of Care  Patient       Patient will benefit from skilled therapeutic intervention in order to improve the following deficits and impairments:  Abnormal gait, Decreased activity tolerance, Decreased endurance, Decreased strength, Decreased balance, Decreased mobility, Difficulty walking, Obesity, Improper body mechanics, Impaired sensation  Visit Diagnosis: Other abnormalities of gait and mobility  Muscle weakness (generalized)  History of falling     Problem List Patient Active Problem List   Diagnosis Date Noted  . Gastroesophageal reflux disease 03/13/2016  . Prostate cancer metastatic to bone ([place]) 09/05/2015  . Nausea 08/21/2015  . SOB (shortness of breath) on exertion 08/21/2015  . 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 Osteoarthritis of right knee 01/16/2014    Kipp Brood, PT, DPT Physical Therapist with [facility]  05/25/2017 8:59 AM    [place], [place], [address] Phone: [phone]   Fax:  [phone]  Name: DAILAN PFALZGRAF MRN: [id number] Date of Birth: 10-Nov-1945

## 2017-05-25 NOTE — Patient Instructions (Addendum)
   Standing marching in place-Supported: 2 x 10-15 reps each leg (hold for 5 seconds)  Stand facing your kitchen countertop. Place 1 or 2 hands on the counter for support. Begin marching in place, bringing your left knee up then down, followed by your right knee up and down. Continue alternating legs in this manner for the duration of the exercise.

## 2017-05-26 ENCOUNTER — Ambulatory Visit (HOSPITAL_COMMUNITY): Payer: Medicare Other | Admitting: Hematology

## 2017-05-27 ENCOUNTER — Ambulatory Visit (HOSPITAL_COMMUNITY): Payer: Medicare Other | Admitting: Hematology

## 2017-05-28 ENCOUNTER — Encounter (HOSPITAL_COMMUNITY): Payer: Self-pay

## 2017-05-28 ENCOUNTER — Ambulatory Visit (HOSPITAL_COMMUNITY): Payer: Medicare Other

## 2017-05-28 ENCOUNTER — Other Ambulatory Visit: Payer: Self-pay

## 2017-05-28 DIAGNOSIS — R2689 Other abnormalities of gait and mobility: Secondary | ICD-10-CM | POA: Diagnosis not present

## 2017-05-28 DIAGNOSIS — Z9181 History of falling: Secondary | ICD-10-CM

## 2017-05-28 DIAGNOSIS — M6281 Muscle weakness (generalized): Secondary | ICD-10-CM

## 2017-05-28 NOTE — Patient Instructions (Signed)
   TANDEM STANCE WITH SUPPORT: 3-5 times for 10-15 seconds alternating foot position  Stand in front of a chair, table or counter top for support. Then place the heel of one foot so that it is touching the toes of the other foot. Maintain your balance in this position.

## 2017-05-28 NOTE — Therapy (Signed)
[place], [place], [address] Phone: [phone]   Fax:  [phone]  Physical Therapy Treatment  Patient Details  Name: Micheal Davis MRN: [id number] Date of Birth: [birth year] Referring Provider: Creola Corn, MD   Encounter Date: 05/28/2017  PT End of Session - 05/28/17 0825    Visit Number  6    Number of Visits  13    Date for PT Re-Evaluation  06/01/17    Authorization Type  Medicare Part A and B    Authorization Time Period  04/20/17 - 06/01/17    Authorization - Visit Number  5    Authorization - Number of Visits  10    PT Start Time  0818    PT Stop Time  0858    PT Time Calculation (min)  40 min    Activity Tolerance  Patient tolerated treatment well    Behavior During Therapy  [facility] for tasks assessed/performed       Past Medical History:  Diagnosis Date  . Diabetes mellitus without complication ([place])   . Hypertension   . Prostate cancer ([place]) 09/05/2015  . Prostate cancer metastatic to bone ([place]) 09/05/2015  . Sleep apnea   . Thyroid disease     Past Surgical History:  Procedure Laterality Date  . CATARACT EXTRACTION    . PORTACATH PLACEMENT Left 12/31/2015   Procedure: INSERTION PORT-A-CATH LEFT SUBCLAVIAN;  Surgeon: Aviva Signs, MD;  Location: [facility];  Service: General;  Laterality: Left;  . REPLACEMENT TOTAL KNEE Left     There were no vitals filed for this visit.  Subjective Assessment - 05/28/17 3009    Subjective  Patient reports first day after last therapy session he was really sore, reports abuot 9/10 pain in his legs. He describes it as a dull ache. He states that today his painis around a 3-4/10. He did begin his marching at his kitchen counter as a part of his HEP and reports no difficulty.     Pertinent History  Stage 4 prostate cancer, Left TKA    Limitations  Walking;House hold activities    How long can you sit comfortably?  unlimited    How long can you stand  comfortably?  severalm inutes before he becomes too fatigued and needs to sit to rest    How long can you walk comfortably?  ~5 minutes before too tired to keep walking    Patient Stated Goals  get stronger and have more energy to walk or do activites for longer amounts of time    Currently in Pain?  Yes    Pain Score  4     Pain Location  Leg    Pain Orientation  Right;Left;Anterior    Pain Descriptors / Indicators  Aching;Dull;Sore    Pain Onset  In the past 7 days    Pain Frequency  Constant    Pain Relieving Factors  been improving gradually       No data recorded    [facility] Adult PT Treatment/Exercise - 05/28/17 0001      Knee/Hip Exercises: Standing   Heel Raises  Both;1 set;20 reps;Limitations    Heel Raises Limitations  1x 20 reps Toe raises    Lateral Step Up  Both;10 reps;Step Height: 4"    Forward Step Up  Both;10 reps;Step Height: 4"    Step Down  Hand Hold: 1;Step Height: 2";10 reps;Both        Balance Exercises - 05/28/17  0837      Balance Exercises: Standing   Tandem Stance  Foam/compliant surface;Other reps (comment);30 secs 6 reps alternating feet    Standing, One Foot on a Step  Eyes open;4 inch;20 secs;Limitations;4 reps 4 reps Bil LE    Tandem Gait  Forward;Retro;Limitations;4 reps 15' each    Partial Tandem Stance  Eyes open;Intermittent upper extremity support;2 reps;15 secs alternating feet for HEP, at counter    Step Over Hurdles / Cones  lateral step over  Two 6" hurdle; 1x 10 reps each direction       PT Education - 05/28/17 0824    Education provided  Yes    Education Details  educated on Exercises throughout and updated HEP    Person(s) Educated  Patient    Methods  Explanation;Handout    Comprehension  Verbalized understanding       PT Short Term Goals - 05/18/17 0906      PT SHORT TERM GOAL #1   Title  Patient will be independent with HEP    Baseline  05/18/17 - limited by sickness secondary to cemotherapy    Time  3    Period  Weeks     Status  Partially Met      PT SHORT TERM GOAL #2   Title  Patient will improve MMT for limited groups by 1/2 grade to demonstrate improved functional strength for improved performance with stairs and gait.    Baseline  05/18/17 - all 1/2 improved    Time  3    Period  Weeks    Status  Achieved      PT SHORT TERM GOAL #3   Title  Patient will demosntrate improved balance by performing SLS for Bil LE for 10 seconds each to imprvoe performance to ascend/descend stairs with decreased risk of falling.    Baseline  05/18/17 - Rt EL = 5 sec, Lt LE =9 sec    Time  3    Period  Weeks    Status  Partially Met        PT Long Term Goals - 05/18/17 5400      PT LONG TERM GOAL #1   Title  Patient will improve MMT for limited groups by 1 grade to demonstrate improved functional strength for improved performance with stairs and gait.    Baseline  05/18/17 - all 1/2 improved    Time  6    Period  Weeks    Status  On-going      PT LONG TERM GOAL #2   Title  Patient will perform 5x sit to stand in = or <12 seconds to demonstrate improve functional LE strength and to decrease risk of falling.    Baseline  05/18/17 - 16 seconds, no UE    Time  6    Period  Weeks    Status  On-going      PT LONG TERM GOAL #3   Title  Patient will score =>20/24 on the DGI to demonstrate significant improvement in balance during gait and decrease risk of falling.    Time  6    Period  Weeks    Status  On-going         Plan - 05/28/17 0912    Clinical Impression Statement  Patient arrived sore from last session describing pain as DOMS. Today's session focused on balance training and included some functional strengthening. He continued SLS with contralateral foot on step in back and progressed to tandem  gait. He demonstrated 2 episodes of LOB with tandem gait and required Min assist to regain stability. Tandem at counter was added to HEP to progress balance training at home. He will continue to benefit from  skilled PT services to address ongoing impairments and improve functional mobility to decrease risk of falling.    Rehab Potential  Good    PT Frequency  2x / week    PT Duration  6 weeks    PT Treatment/Interventions  ADLs/Self Care Home Management;Cryotherapy;Electrical Stimulation;Moist Heat;Gait training;DME Instruction;Stair training;Functional mobility training;Therapeutic activities;Therapeutic exercise;Balance training;Neuromuscular re-education;Patient/family education;Energy conservation    PT Next Visit Plan  Re-Assess next session (will need new cert if needs more). Add more PRN throughot tsessions. Continue proxmial hip strengthening and singel limb balance training, progressing as able. Perform step ups and downs on lowe step for strength next session. Continue with tandem gait using gait belt for safety. Update HEP with sidelyign hip abduction.    PT Home Exercise Plan  Eval: bridge; 2/20: SLR, SLS; 05/25/17 - marching at counter; 05/28/17 - tandem at counter    Consulted and Agree with Plan of Care  Patient       Patient will benefit from skilled therapeutic intervention in order to improve the following deficits and impairments:  Abnormal gait, Decreased activity tolerance, Decreased endurance, Decreased strength, Decreased balance, Decreased mobility, Difficulty walking, Obesity, Improper body mechanics, Impaired sensation  Visit Diagnosis: Other abnormalities of gait and mobility  Muscle weakness (generalized)  History of falling     Problem List Patient Active Problem List   Diagnosis Date Noted  . Gastroesophageal reflux disease 03/13/2016  . Prostate cancer metastatic to bone ([place]) 09/05/2015  . Nausea 08/21/2015  . SOB (shortness of breath) on exertion 08/21/2015  . 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 Osteoarthritis of right knee 01/16/2014    Kipp Brood, PT, DPT Physical Therapist with  [facility]  05/28/2017 10:40 AM    [place], [place], [address] Phone: [phone]   Fax:  [phone]  Name: Micheal Davis MRN: [id number] Date of Birth: 1945-12-14

## 2017-05-29 ENCOUNTER — Inpatient Hospital Stay (HOSPITAL_BASED_OUTPATIENT_CLINIC_OR_DEPARTMENT_OTHER): Payer: Medicare Other | Admitting: Hematology

## 2017-05-29 ENCOUNTER — Other Ambulatory Visit: Payer: Self-pay

## 2017-05-29 ENCOUNTER — Encounter (HOSPITAL_COMMUNITY): Payer: Self-pay | Admitting: Hematology

## 2017-05-29 DIAGNOSIS — I10 Essential (primary) hypertension: Secondary | ICD-10-CM | POA: Diagnosis not present

## 2017-05-29 DIAGNOSIS — G609 Hereditary and idiopathic neuropathy, unspecified: Secondary | ICD-10-CM | POA: Diagnosis not present

## 2017-05-29 DIAGNOSIS — E119 Type 2 diabetes mellitus without complications: Secondary | ICD-10-CM | POA: Diagnosis not present

## 2017-05-29 DIAGNOSIS — C7951 Secondary malignant neoplasm of bone: Secondary | ICD-10-CM | POA: Diagnosis not present

## 2017-05-29 DIAGNOSIS — C787 Secondary malignant neoplasm of liver and intrahepatic bile duct: Secondary | ICD-10-CM | POA: Diagnosis not present

## 2017-05-29 DIAGNOSIS — Z5111 Encounter for antineoplastic chemotherapy: Secondary | ICD-10-CM | POA: Diagnosis not present

## 2017-05-29 DIAGNOSIS — C61 Malignant neoplasm of prostate: Secondary | ICD-10-CM

## 2017-05-29 NOTE — Progress Notes (Signed)
[address], [place] [address]   CLINIC:  Medical Oncology/Hematology  PCP:  [name], MD [place] [address] [phone]   REASON FOR VISIT:  Follow-up for prostatic prostate cancer.  CURRENT THERAPY: Cabazitaxel on hold.  BRIEF ONCOLOGIC HISTORY:    Prostate cancer metastatic to bone ([place])   10/01/2012 Initial Diagnosis    Prostate biopsied with highest Gleason score of 9 seen and the lowest score was 7.      10/04/2012 - 05/16/2013 Chemotherapy    Depo-Lupron and Casodex initiated      05/16/2013 -  Chemotherapy    Depo-Lupron monthly continued      05/16/2013 Progression    Progression by PSA elevation      05/16/2013 - 10/22/2014 Chemotherapy    Abiraterone and prednisone initiated in conjunction with ongoing Depo-Lupron.  Denosumab also ongoing.      10/23/2014 Progression    PSA increasing from 0.2- 1.6 in less than 6 months.        10/23/2014 - 01/30/2015 Chemotherapy    Enzalutamide and Prednisone (5 mg in AM and 2.5 mg in PM)      01/30/2015 Imaging    Bone scan- New focus of intense activity in right proximal humerus.  Interim increase in activity over left hip.      01/30/2015 Progression    Bone scan reveals new disease in right humerus consistent with progression of disease      01/31/2015 Imaging    Right humerus xray- blastic foci in proximal right humeral metaphysis and over right mid-humeral diaphysis.  No evidence of fracture      07/06/2015 Progression    Progression in multiple bones especially L hip and femurs      07/06/2015 Imaging    Bone scan- progressive multifocal osseous metastases in the right proximal femora and distal femoral shafts.  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 Tammi Klippel      11/23/2015 - 01/04/2016 Chemotherapy    The patient had pegfilgrastim  (NEULASTA ONPRO KIT) injection 6 mg, 6 mg, Subcutaneous, Once, 3 of 7 cycles  DOCEtaxel (TAXOTERE) 180 mg in dextrose 5 % 250 mL chemo infusion, 75 mg/m2 = 180 mg, Intravenous,  Once, 3 of 7 cycles Dose modification: 64 mg/m2 (original dose 75 mg/m2, Cycle 2, Reason: Dose not tolerated)  pegfilgrastim (NEULASTA ONPRO KIT) injection 6 mg, 6 mg, Subcutaneous, Once, 0 of 4 cycles  cabazitaxel (JEVTANA) 60 mg in dextrose 5 % 250 mL chemo infusion, 25 mg/m2, Intravenous,  Once, 0 of 4 cycles  for chemotherapy treatment.        11/30/2015 Adverse Reaction    Diarrhea (secondary to chemotherapy) and dehydration requiring IV fluids      12/14/2015 Treatment Plan Change    Docetaxel dose reduced by 15%      12/31/2015 Procedure    Port placed by Dr. Arnoldo Morale      01/28/2016 -  Chemotherapy    Cabazitaxel (Jevtana)       03/27/2016 Imaging    CT Chest, Abdomen, and Pelvis with contrast 1. Diffuse sclerotic osseous metastatic disease in the chest, abdomen, and pelvis without acute fracture identified. 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 These appear chronic. 2. The prostate gland is normal in size and no  adenopathy is currently identified. 3. On a prior MRI of 10/04/2012, there was a posterior right hepatic lobe lesion. This lesion is not currently visible on today' s CT. This could be due to differences in cons acuity between CT or MRI, or resolution of the lesion. 4. Coronary, aortic arch, and branch vessel atherosclerotic vascular disease. Aortoiliac atherosclerotic vascular disease. 5. Single bilateral renal cysts.      04/16/2016 Imaging    Bone scan- Findings consist with progressive metastatic disease. Activity over the proximal right humerus and proximal bilateral femurs are worrisome for the possible development of pathologic fractures.      08/21/2016 Imaging    CT C/A/P: IMPRESSION: No significant change since 03/27/2016 CT.  Diffuse bony metastases without other evidence of metastatic disease.      08/21/2016 Imaging    Bone Scan: IMPRESSION: Multiple areas of increased activity again noted throughout the axial and appendicular skeleton in similar locations as prior exam. Intensity of uptake is increased from prior exam suggesting progressive disease. Lesions present in the proximal humeri, proximal femurs, and the mid right femur susceptible to pathologic fracture.      12/02/2016 Imaging    CT C/A/P: IMPRESSION: 1. Overall stable appearance of diffuse osseous metastatic disease and resulting patchy sclerosis. 2. The patient had a posterior right hepatic lobe lesion on prior MRI from 2014 which is been relatively occult on CT. Given the lack of progression I suspect that this is benign or has been effectively treated. 3. Aortic Atherosclerosis (ICD10-I70.0). Coronary atherosclerosis with mild cardiomegaly. 4. Cholelithiasis. 5. Bilateral benign renal cysts. 6. 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      12/02/2016 Imaging    Bone Scan: IMPRESSION: 1. Widespread osseous metastatic disease. Multiplicity is similar to previous exam with interval increase in degree of tracer uptake associated with multiple lesions.      03/11/2017 Imaging    Bone Scan: Multifocal skeletal disease without sign of progression CT C/A/P: Diffuse sclerotic skeletal lesions, unchanged from the previous.  No new lymphadenopathy, pulmonary nodules, or hepatic lesions to suggest soft tissue progressive disease        CANCER STAGING: Cancer Staging Prostate cancer metastatic to bone [facility]) Staging form: Prostate, AJCC 7th Edition - Clinical: No stage assigned - Unsigned    INTERVAL HISTORY:  Mr. Goodpasture 72 y.o. male returns for follow-up after  his liver biopsy.  I have arranged for liver biopsy due to progression on cabazitaxel.  I have also done genetic testing for BRCA 1/2 mutations.  His numbness in the feet and legs has been stable.  He has slight ankle swellings.  He had one episode of nausea and vomited once after eating greasy food.  His tiredness is stable.  He had one tooth pulled out on 05/27/2017.  REVIEW OF SYSTEMS:  Review of Systems  Constitutional: Positive for fatigue.  HENT:  Negative.   Respiratory: Negative.   Cardiovascular: Positive for leg swelling.  Gastrointestinal: Positive for nausea and vomiting.  Genitourinary: Negative.    Musculoskeletal: Negative.   Skin: Negative.   Neurological: Positive for numbness.  Psychiatric/Behavioral: Negative.      PAST MEDICAL/SURGICAL HISTORY:  Past Medical History:  Diagnosis Date  . Diabetes mellitus without complication ([place])   . Hypertension   . Prostate cancer ([place]) 09/05/2015  . Prostate cancer metastatic to bone ([place]) 09/05/2015  .  Sleep apnea   . Thyroid disease    Past Surgical History:  Procedure Laterality Date  . CATARACT EXTRACTION    . PORTACATH PLACEMENT Left 12/31/2015   Procedure: INSERTION PORT-A-CATH LEFT SUBCLAVIAN;  Surgeon: Aviva Signs, MD;  Location: [facility];  Service: General;  Laterality: Left;  . REPLACEMENT TOTAL KNEE Left      SOCIAL HISTORY:  Social History   Socioeconomic History  . Marital status: Married    Spouse name: Not on file  . Number of children: Not on file  . Years of education: Not on file  . Highest education level: Not on file  Occupational History  . Not on file  Social Needs  . Financial resource strain: Not on file  . Food insecurity:    Worry: Not on file    Inability: Not on file  . Transportation needs:    Medical: Not on file    Non-medical: Not on file  Tobacco Use  . Smoking status: Never Smoker  . Smokeless tobacco: Never Used  Substance and Sexual Activity  . Alcohol use: Yes     Comment: 1 beer each month  . Drug use: No  . Sexual activity: Never    Comment: married  Lifestyle  . Physical activity:    Days per week: Not on file    Minutes per session: Not on file  . Stress: Not on file  Relationships  . Social connections:    Talks on phone: Not on file    Gets together: Not on file    Attends religious service: Not on file    Active member of club or organization: Not on file    Attends meetings of clubs or organizations: Not on file    Relationship status: Not on file  . Intimate partner violence:    Fear of current or ex partner: Not on file    Emotionally abused: Not on file    Physically abused: Not on file    Forced sexual activity: Not on file  Other Topics Concern  . Not on file  Social History Narrative  . Not on file    FAMILY HISTORY:  History reviewed. No pertinent family history.  CURRENT MEDICATIONS:  Outpatient Encounter Medications as of 05/29/2017  Medication Sig Note  . atenolol (TENORMIN) 100 MG tablet Take 100 mg by mouth daily.   Marland Kitchen atorvastatin (LIPITOR) 20 MG tablet Take 10 mg by mouth daily.   . calcium carbonate (OS-CAL - DOSED IN MG OF ELEMENTAL CALCIUM) 1250 (500 Ca) MG tablet Take 1 tablet by mouth 2 (two) times daily.    Marland Kitchen denosumab (XGEVA) 120 MG/1.7ML SOLN injection Inject 120 mg into the skin every 28 (twenty-eight) days. 05/18/2017: On hold per MD  . diltiazem (CARDIZEM CD) 300 MG 24 hr capsule Take 300 mg by mouth daily.   Marland Kitchen gabapentin (NEURONTIN) 300 MG capsule Take 1 capsule (300 mg total) by mouth 3 (three) times daily. (Patient taking differently: Take 300-600 mg by mouth See admin instructions. Take 300 mg by mouth in the morning and take 600 mg by mouth at bedtime)   . Glucosamine-Chondroit-Vit C-Mn (GLUCOSAMINE 1500 COMPLEX PO) Take 1 tablet by mouth 2 (two) times daily.    Marland Kitchen KLOR-CON M20 20 MEQ tablet TAKE 1 TABLET BY MOUTH ONCE DAILY (Patient taking differently: TAKE 20 MEQ BY MOUTH ONCE DAILY)   . leuprolide  (LUPRON) 7.5 MG injection Inject 7.5 mg into the muscle every 28 (twenty-eight) days.   Marland Kitchen  levothyroxine (SYNTHROID, LEVOTHROID) 25 MCG tablet Take 25 mcg by mouth daily before breakfast.   . loperamide (IMODIUM) 1 MG/5ML solution Take 1 mg by mouth as needed for diarrhea or loose stools.   . Lysine 500 MG CAPS Take 500 mg by mouth daily.   . magic mouthwash w/lidocaine SOLN 1 part of each of the following: Benadryl 12.48m /532m Viscous lidocaine 2%, Maalox. Swish and swallow 5 mL QID. (Patient taking differently: Take 5 mLs by mouth 4 (four) times daily as needed for mouth pain. Benadryl 12.80m58m80ml54miscous lidocaine 2%, Maalox.)   . metFORMIN (GLUCOPHAGE) 500 MG tablet Take 500 mg by mouth 2 (two) times daily with a meal.   . morphine (MS CONTIN) 60 MG 12 hr tablet Take 1 tablet (60 mg total) by mouth every 12 (twelve) hours.   . moMarland Kitchenphine (MSIR) 30 MG tablet Take 1 tablet (30 mg total) by mouth every 4 (four) hours as needed for severe pain.   . Multiple Vitamin (MULTIVITAMIN WITH MINERALS) TABS tablet Take 1 tablet by mouth daily.   . Omega-3 Fatty Acids (FISH OIL PO) Take 1 capsule by mouth daily.   . ondansetron (ZOFRAN) 8 MG tablet Take 1 tablet (8 mg total) by mouth 2 (two) times daily as needed (Nausea or vomiting). (Patient taking differently: Take 8 mg by mouth 2 (two) times daily as needed for nausea or vomiting. )   . ondansetron (ZOFRAN-ODT) 8 MG disintegrating tablet Take 1 tablet (8 mg total) by mouth every 8 (eight) hours as needed for nausea or vomiting.   . predniSONE (DELTASONE) 5 MG tablet Take 5 mg by mouth 2 (two) times daily with a meal.   . prochlorperazine (COMPAZINE) 10 MG tablet Take 1 tablet (10 mg total) by mouth every 6 (six) hours as needed (Nausea or vomiting). (Patient taking differently: Take 10 mg by mouth every 6 (six) hours as needed for nausea or vomiting. )   . tamsulosin (FLOMAX) 0.4 MG CAPS capsule Take 0.8 mg by mouth daily.    .  tMarland Kitcheniamterene-hydrochlorothiazide (MAXZIDE-25) 37.5-25 MG tablet Take 1 tablet by mouth daily. for high blood pressure    Facility-Administered Encounter Medications as of 05/29/2017  Medication  . leuprolide (LUPRON) injection 7.5 mg    ALLERGIES:  No Known Allergies   PHYSICAL EXAM:  ECOG Performance status: 1  Vitals:   05/29/17 1039  BP: (!) 143/77  Pulse: 73  Resp: 18  Temp: 98.6 F (37 C)  SpO2: 96%   Filed Weights   05/29/17 1039  Weight: 226 lb 8 oz (102.7 kg)      LABORATORY DATA:  I have reviewed the labs as listed.  CBC    Component Value Date/Time   WBC 6.2 05/22/2017 1138   RBC 3.53 (L) 05/22/2017 1138   HGB 10.3 (L) 05/22/2017 1138   HCT 32.5 (L) 05/22/2017 1138   PLT 197 05/22/2017 1138   MCV 92.1 05/22/2017 1138   MCH 29.2 05/22/2017 1138   MCHC 31.7 05/22/2017 1138   RDW 16.0 (H) 05/22/2017 1138   LYMPHSABS 0.6 (L) 05/01/2017 1406   MONOABS 0.6 05/01/2017 1406   EOSABS 0.0 05/01/2017 1406   BASOSABS 0.0 05/01/2017 1406   CMP Latest Ref Rng & Units 05/01/2017 04/29/2017 04/23/2017  Glucose 65 - 99 mg/dL 165(H) 173(H) 150(H)  BUN 6 - 20 mg/dL _0 Creatinine 0.61 - 1.24 mg/dL 0.82 0.74 0.74  Sodium 135 - 145 mmol/L 126(L) 139 141  Potassium 3.5 -  5.1 mmol/L 3.7 4.0 3.5  Chloride 101 - 111 mmol/L 88(L) 102 105  CO2 22 - 32 mmol/L _0 Calcium 8.9 - 10.3 mg/dL 9.0 9.6 8.5(L)  Total Protein 6.5 - 8.1 g/dL - 6.8 6.3(L)  Total Bilirubin 0.3 - 1.2 mg/dL - 0.5 0.7  Alkaline Phos 38 - 126 U/L - 139(H) 182(H)  AST 15 - 41 U/L - 32 32  ALT 17 - 63 U/L - 18 18       DIAGNOSTIC IMAGING:  I have reviewed his scans which showed progression with new liver lesion and new bone lesions.     ASSESSMENT & PLAN:   Prostate cancer metastatic to bone ([place]) 1.  Metastatic prostate cancer to the bones and liver: He is here for follow-up of biopsy results.  I have checked genetic testing for germline mutations to see if he will be a candidate for  olaparib.  However his mutation testing was negative.  We went over the biopsy results of the liver lesion which showed adenocarcinoma consistent with prostatic primary.  He most recently progressed on cabazitaxel.  He has used both [place] in the past.  In the beginning he received 2 cycles of docetaxel.  It was unclear whether docetaxel was discontinued secondary to progression of disease.  We have recommended PDL 1 testing on the liver biopsy.  If it is positive he could be a candidate for pembrolizumab.  I will also order foundation 1 testing.  I will see if he has any eligible clinical trials at [facility].  Functionally he is doing very well.  He will come back in 1 week for follow-up.  2.  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  This is from prior chemotherapy.  He takes gabapentin 1 tablet in the morning and 2 tablets at bedtime.      Orders placed this encounter:  No orders of the defined types were placed in this encounter.     Derek Jack, MD [place] [phone]

## 2017-05-29 NOTE — Progress Notes (Signed)
PDL-1 and Foundation one ordered on specimen [id number] liver biopsy.  I spoke with Varney Biles in pathology to order this and she said that there may not be enough tissue to do both tests but she will call us back to let us know.

## 2017-05-29 NOTE — Assessment & Plan Note (Signed)
1.  Metastatic prostate cancer to the bones and liver: He is here for follow-up of biopsy results.  I have checked genetic testing for germline mutations to see if he will be a candidate for olaparib.  However his mutation testing was negative.  We went over the biopsy results of the liver lesion which showed adenocarcinoma consistent with prostatic primary.  He most recently progressed on cabazitaxel.  He has used both [place] in the past.  In the beginning he received 2 cycles of docetaxel.  It was unclear whether docetaxel was discontinued secondary to progression of disease.  We have recommended PDL 1 testing on the liver biopsy.  If it is positive he could be a candidate for pembrolizumab.  I will also order foundation 1 testing.  I will see if he has any eligible clinical trials at [facility].  Functionally he is doing very well.  He will come back in 1 week for follow-up.  2.  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  This is from prior chemotherapy.  He takes gabapentin 1 tablet in the morning and 2 tablets at bedtime.

## 2017-06-01 ENCOUNTER — Ambulatory Visit (HOSPITAL_COMMUNITY): Payer: Medicare Other | Attending: Internal Medicine

## 2017-06-01 ENCOUNTER — Other Ambulatory Visit: Payer: Self-pay

## 2017-06-01 DIAGNOSIS — M6281 Muscle weakness (generalized): Secondary | ICD-10-CM

## 2017-06-01 DIAGNOSIS — Z9181 History of falling: Secondary | ICD-10-CM

## 2017-06-01 DIAGNOSIS — R2689 Other abnormalities of gait and mobility: Secondary | ICD-10-CM | POA: Diagnosis not present

## 2017-06-01 NOTE — Therapy (Signed)
[place], [place], [address] Phone: [phone]   Fax:  [phone]  Physical Therapy Treatment/Re-assessment  Patient Details  Name: Micheal Davis MRN: [id number] Date of Birth: [birth year] Referring Provider: Creola Corn, MD   Encounter Date: 06/01/2017  PT End of Session - 06/01/17 0855    Visit Number  7    Number of Visits  19    Date for PT Re-Evaluation  06/22/17    Authorization Type  Medicare Part A and B    Authorization Time Period  04/20/17 - 06/01/17; 06/01/17-06/22/17    Authorization - Visit Number  1    Authorization - Number of Visits  10    PT Start Time  0818    PT Stop Time  0859    PT Time Calculation (min)  41 min    Activity Tolerance  Patient tolerated treatment well    Behavior During Therapy  [facility] for tasks assessed/performed       Past Medical History:  Diagnosis Date  . Diabetes mellitus without complication ([place])   . Hypertension   . Prostate cancer ([place]) 09/05/2015  . Prostate cancer metastatic to bone ([place]) 09/05/2015  . Sleep apnea   . Thyroid disease     Past Surgical History:  Procedure Laterality Date  . CATARACT EXTRACTION    . PORTACATH PLACEMENT Left 12/31/2015   Procedure: INSERTION PORT-A-CATH LEFT SUBCLAVIAN;  Surgeon: Aviva Signs, MD;  Location: [facility];  Service: General;  Laterality: Left;  . REPLACEMENT TOTAL KNEE Left     There were no vitals filed for this visit.  Subjective Assessment - 06/01/17 0928    Subjective  Patient reports he has done better with his HEP over the weekend and tried to keep up with more of his exercises. He states his back is bothering him a little bit and that he has been having very bad night sweats. I encouraged him to discuss this with his MD.    Pertinent History  Stage 4 prostate cancer, Left TKA    Limitations  Walking;House hold activities    How long can you sit comfortably?  unlimited    How long can you stand  comfortably?  severalm inutes before he becomes too fatigued and needs to sit to rest    How long can you walk comfortably?  ~5 minutes before too tired to keep walking    Patient Stated Goals  get stronger and have more energy to walk or do activites for longer amounts of time    Currently in Pain?  Yes    Pain Score  5     Pain Location  Back    Pain Orientation  Lower    Pain Descriptors / Indicators  Aching    Pain Onset  1 to 4 weeks ago    Pain Frequency  Constant         [facility] PT Assessment - 06/01/17 0001      Assessment   Medical Diagnosis  Gait and Balance abnormalities    Referring Provider  Creola Corn, MD    Next MD Visit  Monday appointment with Dr. Raliegh Ip    Prior Therapy  8-9 years ago for Lt TKA      Precautions   Precautions  Fall      Restrictions   Weight Bearing Restrictions  No      Single Leg Stance   Comments  Rt LE = 5 seconds; Lt LE =  14 seconds      Strength   Right Hip Flexion  4/5    Right Hip Extension  4/5    Right Hip ABduction  4/5    Left Hip Flexion  4-/5    Left Hip Extension  4/5    Left Hip ABduction  4/5    Right Knee Flexion  4+/5    Right Knee Extension  5/5    Left Knee Flexion  4+/5    Left Knee Extension  5/5    Right Ankle Dorsiflexion  5/5    Left Ankle Dorsiflexion  4+/5      Transfers   Five time sit to stand comments   15.85, no UE use      Ambulation/Gait   Stairs  Yes    Stair Management Technique  One rail Right;Alternating pattern;Forwards;Step to pattern    Number of Stairs  4    Height of Stairs  6    Gait Comments  Ascend/descend step over step with 1 hand rail; uses step to pattern with no hand rails to ascend/descend stairs      Dynamic Gait Index   Level Surface  Normal    Change in Gait Speed  Normal    Gait with Horizontal Head Turns  Normal    Gait with Vertical Head Turns  Normal    Gait and Pivot Turn  Normal    Step Over Obstacle  Mild Impairment    Step Around Obstacles  Mild Impairment     Steps  Moderate Impairment    Total Score  20    DGI comment:  20/24 indicated decreased risk of falling         No data recorded   [facility] Adult PT Treatment/Exercise - 06/01/17 0001      Ambulation/Gait   Ambulation/Gait  Yes    Ambulation/Gait Assistance  7: Independent      Knee/Hip Exercises: Standing   Lateral Step Up  Both;Step Height: 4";15 reps    Forward Step Up  Both;Step Height: 4";15 reps      Knee/Hip Exercises: Seated   Sit to Sand  2 sets;5 reps;without UE support       Balance Exercises - 06/01/17 0845      Balance Exercises: Standing   Tandem Stance  Foam/compliant surface;Other reps (comment);30 secs 6 reps alternating foot alignment    Step Over Hurdles / Cones  lateral step over  Two 6" hurdle; 1x 10 reps each direction    Marching Limitations  stair taps on 4" step, 20 taps Bil LE    Other Standing Exercises  --         PT Education - 06/01/17 0933    Education provided  Yes    Education Details  Educated on goal progression and on continuation of therapy. Educated on updated HEP and exercise throughout.     Person(s) Educated  Patient    Methods  Explanation;Handout    Comprehension  Verbalized understanding       PT Short Term Goals - 06/01/17 0839      PT SHORT TERM GOAL #1   Title  Patient will be independent with HEP    Baseline  05/18/17 - limited by sickness secondary to cemotherapy    Time  3    Period  Weeks    Status  Achieved      PT SHORT TERM GOAL #2   Title  Patient will improve MMT for limited groups by 1/2  grade to demonstrate improved functional strength for improved performance with stairs and gait.    Baseline  05/18/17 - all 1/2 improved    Time  3    Period  Weeks    Status  Achieved      PT SHORT TERM GOAL #3   Title  Patient will demosntrate improved balance by performing SLS for Bil LE for 10 seconds each to imprvoe performance to ascend/descend stairs with decreased risk of falling.    Baseline  05/18/17 - Rt  EL = 5 sec, Lt LE =14 sec    Time  3    Period  Weeks    Status  Partially Met    Target Date  06/22/17        PT Long Term Goals - 06/01/17 0840      PT LONG TERM GOAL #1   Title  Patient will improve MMT for limited groups by 1 grade to demonstrate improved functional strength for improved performance with stairs and gait.    Baseline  05/18/17 - all 1/2 improved    Time  3    Period  Weeks    Status  Partially Met    Target Date  06/22/17      PT LONG TERM GOAL #2   Title  Patient will perform 5x sit to stand in = or <12 seconds to demonstrate improve functional LE strength and to decrease risk of falling.    Baseline  06/01/17 - 16 seconds, no UE    Time  3    Period  Weeks    Status  On-going    Target Date  06/22/17      PT LONG TERM GOAL #3   Title  Patient will score =>20/24 on the DGI to demonstrate significant improvement in balance during gait and decrease risk of falling.    Baseline  06/01/17 - 20/24    Time  3    Period  Weeks    Status  Achieved    Target Date  06/22/17        Plan - 06/01/17 1062    Clinical Impression Statement  Re-assessment performed today and patient has partially met/met 3/3 short term goals and 2/3 long term goals. He has made good progress in the last 2 weeks with improved attendance to therapy and increased participation in HEP per patient report. He has made significant improvements in Bil LE strength and his dynamic gait index score indicated he is at a slightly decreased risk of falling. He remains limited by Rt LE single limb balance compared to Lt LE. Given his good progress I anticiapte with 2-3 more weeks of therapy the patient will meet his remaining goals for balance and strength. He will continue to benefit from skilled PT services to address ongoing impairments and improve functional mobility to decrease risk of falling.    Rehab Potential  Good    PT Frequency  2x / week    PT Duration  3 weeks    PT Treatment/Interventions   ADLs/Self Care Home Management;Cryotherapy;Electrical Stimulation;Moist Heat;Gait training;DME Instruction;Stair training;Functional mobility training;Therapeutic activities;Therapeutic exercise;Balance training;Neuromuscular re-education;Patient/family education;Energy conservation    PT Next Visit Plan  Continue proxmial hip strengthening and singel limb balance training, progressing as able. Perform step ups and downs on lowe step for strength next session. Continue with tandem gait using gait belt for safety. Update HEP with sidelyign hip abduction.    PT Home Exercise Plan  Eval: bridge; 2/20: SLR, SLS;  05/25/17 - marching at counter; 05/28/17 - tandem at counter; 4/1/ 19 -sit sto stands;     Consulted and Agree with Plan of Care  Patient       Patient will benefit from skilled therapeutic intervention in order to improve the following deficits and impairments:  Abnormal gait, Decreased activity tolerance, Decreased endurance, Decreased strength, Decreased balance, Decreased mobility, Difficulty walking, Obesity, Improper body mechanics, Impaired sensation  Visit Diagnosis: Other abnormalities of gait and mobility  Muscle weakness (generalized)  History of falling     Problem List Patient Active Problem List   Diagnosis Date Noted  . Gastroesophageal reflux disease 03/13/2016  . Prostate cancer metastatic to bone ([place]) 09/05/2015  . Nausea 08/21/2015  . SOB (shortness of breath) on exertion 08/21/2015  . 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 Osteoarthritis of right knee 01/16/2014    Kipp Brood, PT, DPT Physical Therapist with [facility]  06/01/2017 12:13 PM    [place] [address] [place], [place], [address] Phone: [phone]   Fax:  [phone]  Name: Micheal Davis MRN: [id number] Date of Birth: April 28, 1945

## 2017-06-01 NOTE — Patient Instructions (Signed)
   SIT TO STAND - NO SUPPORT: 1-2 sets 10 repetitions  Start by scooting close to the front of the chair.  Next, lean forward at your trunk and reach forward with your arms and rise to standing without using your hands to push off from the chair or other object.   Use your arms as a counter-balance by reaching forward when in sitting and lower them as you approach standing.

## 2017-06-04 ENCOUNTER — Other Ambulatory Visit: Payer: Self-pay

## 2017-06-04 ENCOUNTER — Ambulatory Visit (HOSPITAL_COMMUNITY): Payer: Medicare Other

## 2017-06-04 ENCOUNTER — Encounter (HOSPITAL_COMMUNITY): Payer: Self-pay

## 2017-06-04 DIAGNOSIS — Z9181 History of falling: Secondary | ICD-10-CM | POA: Diagnosis not present

## 2017-06-04 DIAGNOSIS — R2689 Other abnormalities of gait and mobility: Secondary | ICD-10-CM | POA: Diagnosis not present

## 2017-06-04 DIAGNOSIS — M6281 Muscle weakness (generalized): Secondary | ICD-10-CM | POA: Diagnosis not present

## 2017-06-04 NOTE — Therapy (Signed)
[place], [place], [address] Phone: [phone]   Fax:  [phone]  Physical Therapy Treatment  Patient Details  Name: Micheal Davis MRN: [id number] Date of Birth: [birth year] Referring Provider: Creola Corn, MD   Encounter Date: 06/04/2017  PT End of Session - 06/04/17 0816    Visit Number  8    Number of Visits  19    Date for PT Re-Evaluation  06/22/17    Authorization Type  Medicare Part A and B    Authorization Time Period  04/20/17 - 06/01/17; 06/01/17-06/22/17    Authorization - Visit Number  2    Authorization - Number of Visits  10    PT Start Time  0816    PT Stop Time  0856    PT Time Calculation (min)  40 min    Activity Tolerance  Patient tolerated treatment well    Behavior During Therapy  [facility] for tasks assessed/performed       Past Medical History:  Diagnosis Date  . Diabetes mellitus without complication ([place])   . Hypertension   . Prostate cancer ([place]) 09/05/2015  . Prostate cancer metastatic to bone ([place]) 09/05/2015  . Sleep apnea   . Thyroid disease     Past Surgical History:  Procedure Laterality Date  . CATARACT EXTRACTION    . PORTACATH PLACEMENT Left 12/31/2015   Procedure: INSERTION PORT-A-CATH LEFT SUBCLAVIAN;  Surgeon: Aviva Signs, MD;  Location: [facility];  Service: General;  Laterality: Left;  . REPLACEMENT TOTAL KNEE Left     There were no vitals filed for this visit.  Subjective Assessment - 06/04/17 0849    Subjective  Micheal Davis reports he was performing yard work yesterday and his left arm got brushed by his left arm with branches and has some brusies and scrapes. He reports he was able to get down on one knee while trimming the hedges and used the clippers to help himself back up. He denies any falls/LOB while doing yeardowrk. He reports his overall    Pertinent History  Stage 4 prostate cancer, Left TKA    Limitations  Walking;House hold activities    How long can you sit  comfortably?  unlimited    How long can you stand comfortably?  severalm inutes before he becomes too fatigued and needs to sit to rest    How long can you walk comfortably?  ~5 minutes before too tired to keep walking    Patient Stated Goals  get stronger and have more energy to walk or do activites for longer amounts of time    Currently in Pain?  No/denies         [facility] Adult PT Treatment/Exercise - 06/04/17 0001      Knee/Hip Exercises: Stretches   Piriformis Stretch  1 rep;Both;60 seconds seated      Knee/Hip Exercises: Standing   Heel Raises  Both;1 set;20 reps;Limitations    Heel Raises Limitations  1x 20 reps Toe raises       Balance Exercises - 06/04/17 0829      Balance Exercises: Standing   Tandem Stance  Foam/compliant surface;30 secs;4 reps alt foot alignment    SLS with Vectors  Solid surface;Limitations;Upper extremity assist 1;2 reps 10x 3 way vector each, 2x Bil LE    Standing, One Foot on a Step  Eyes open;4 inch;20 secs;Limitations;30 secs intermittent UE support    Partial Tandem Stance  Eyes open;Foam/compliant surface;Limitations;4 reps 2# dowel for  forward punch/flexion Bil UE, 10x; alt feet    Other Standing Exercises  Warrior pose 1 2x eash LE 15-20 seconds: greater difficulty with Rt foot back       PT Education - 06/04/17 0820    Education provided  Yes    Education Details  Educated on exercises and balance strategies to weight shift to prevent LOB and maitain posture.    Person(s) Educated  Patient    Methods  Explanation    Comprehension  Verbalized understanding       PT Short Term Goals - 06/01/17 0839      PT SHORT TERM GOAL #1   Title  Patient will be independent with HEP    Baseline  05/18/17 - limited by sickness secondary to cemotherapy    Time  3    Period  Weeks    Status  Achieved      PT SHORT TERM GOAL #2   Title  Patient will improve MMT for limited groups by 1/2 grade to demonstrate improved functional strength for improved  performance with stairs and gait.    Baseline  05/18/17 - all 1/2 improved    Time  3    Period  Weeks    Status  Achieved      PT SHORT TERM GOAL #3   Title  Patient will demosntrate improved balance by performing SLS for Bil LE for 10 seconds each to imprvoe performance to ascend/descend stairs with decreased risk of falling.    Baseline  05/18/17 - Rt EL = 5 sec, Lt LE =14 sec    Time  3    Period  Weeks    Status  Partially Met    Target Date  06/22/17        PT Long Term Goals - 06/01/17 0840      PT LONG TERM GOAL #1   Title  Patient will improve MMT for limited groups by 1 grade to demonstrate improved functional strength for improved performance with stairs and gait.    Baseline  05/18/17 - all 1/2 improved    Time  3    Period  Weeks    Status  Partially Met    Target Date  06/22/17      PT LONG TERM GOAL #2   Title  Patient will perform 5x sit to stand in = or <12 seconds to demonstrate improve functional LE strength and to decrease risk of falling.    Baseline  06/01/17 - 16 seconds, no UE    Time  3    Period  Weeks    Status  On-going    Target Date  06/22/17      PT LONG TERM GOAL #3   Title  Patient will score =>20/24 on the DGI to demonstrate significant improvement in balance during gait and decrease risk of falling.    Baseline  06/01/17 - 20/24    Time  3    Period  Weeks    Status  Achieved    Target Date  06/22/17        Plan - 06/04/17 0846    Clinical Impression Statement  Patient arrives denying pain today but reports after he has therapy he is sore for 1-2 days after. I continued to educate him on DOMS and he verbalized his understanding of this. Today's session focused on dynamic balance training and compliant surfaces with a focus on SLS. Tandem balance was progressed with UE task and stretches were  included today for bil hips with reports of tightness and fatigue. Patient will continue to benefit from skilled PT services to address deficits and  improve functional mobility to decrease risk of falling.    Rehab Potential  Good    PT Frequency  2x / week    PT Duration  3 weeks    PT Treatment/Interventions  ADLs/Self Care Home Management;Cryotherapy;Electrical Stimulation;Moist Heat;Gait training;DME Instruction;Stair training;Functional mobility training;Therapeutic activities;Therapeutic exercise;Balance training;Neuromuscular re-education;Patient/family education;Energy conservation    PT Next Visit Plan  Update HEP with sidelyign hip abduction. Continue with tandem gait and rerto grait for balance training. Initiate half knee to stand transfer training. Continue proxmial hip strengthening and singel limb balance training, progressing as able. Perform step ups and downs on lowe step for strength next session.     PT Home Exercise Plan  Eval: bridge; 2/20: SLR, SLS; 05/25/17 - marching at counter; 05/28/17 - tandem at counter; 4/1/ 19 -sit sto stands;     Consulted and Agree with Plan of Care  Patient       Patient will benefit from skilled therapeutic intervention in order to improve the following deficits and impairments:  Abnormal gait, Decreased activity tolerance, Decreased endurance, Decreased strength, Decreased balance, Decreased mobility, Difficulty walking, Obesity, Improper body mechanics, Impaired sensation  Visit Diagnosis: Other abnormalities of gait and mobility  Muscle weakness (generalized)  History of falling     Problem List Patient Active Problem List   Diagnosis Date Noted  . Gastroesophageal reflux disease 03/13/2016  . Prostate cancer metastatic to bone ([place]) 09/05/2015  . Nausea 08/21/2015  . SOB (shortness of breath) on exertion 08/21/2015  . 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 Osteoarthritis of right knee 01/16/2014    Kipp Brood, PT, DPT Physical Therapist with [facility]  06/04/2017 9:04 AM    [place], [place], [address] Phone: [phone]   Fax:  [phone]  Name: Micheal Davis MRN: [id number] Date of Birth: 1945-06-16

## 2017-06-08 ENCOUNTER — Encounter (HOSPITAL_COMMUNITY): Payer: Self-pay | Admitting: Hematology

## 2017-06-08 ENCOUNTER — Other Ambulatory Visit: Payer: Self-pay

## 2017-06-08 ENCOUNTER — Inpatient Hospital Stay (HOSPITAL_COMMUNITY): Payer: Medicare Other | Attending: Hematology and Oncology | Admitting: Hematology

## 2017-06-08 DIAGNOSIS — G609 Hereditary and idiopathic neuropathy, unspecified: Secondary | ICD-10-CM | POA: Diagnosis not present

## 2017-06-08 DIAGNOSIS — Z5111 Encounter for antineoplastic chemotherapy: Secondary | ICD-10-CM | POA: Insufficient documentation

## 2017-06-08 DIAGNOSIS — C61 Malignant neoplasm of prostate: Secondary | ICD-10-CM | POA: Insufficient documentation

## 2017-06-08 DIAGNOSIS — C7951 Secondary malignant neoplasm of bone: Secondary | ICD-10-CM | POA: Diagnosis not present

## 2017-06-08 DIAGNOSIS — E119 Type 2 diabetes mellitus without complications: Secondary | ICD-10-CM | POA: Diagnosis not present

## 2017-06-08 DIAGNOSIS — C787 Secondary malignant neoplasm of liver and intrahepatic bile duct: Secondary | ICD-10-CM | POA: Insufficient documentation

## 2017-06-08 NOTE — Assessment & Plan Note (Signed)
1.  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 - BRCA 1/2 negative, PDL 1 expression of 0% -Foundation 1 testing results pending -Most recent progression on cabazitaxel.  He has used [place] in the past.  He had received 2 cycles of docetaxel in the beginning.  It is unclear whether this was discontinued secondary to progression of disease.  Functionally he is doing very well.  He will come back in 1 week to follow-up on the foundation one results.  We will also check into clinical trials at [facility].  2.  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  This is from prior chemotherapy.  He takes gabapentin 1 tablet in the morning and 2 tablets at bedtime.

## 2017-06-08 NOTE — Progress Notes (Signed)
Micheal Davis, [place] [address]   CLINIC:  Medical Oncology/Hematology  PCP:  [name], MD [place] [address] [phone]   REASON FOR VISIT:  Follow-up for prostate cancer to the liver.  CURRENT THERAPY: Cabazitaxel discontinued.  BRIEF ONCOLOGIC HISTORY:    Prostate cancer metastatic to bone ([place])   10/01/2012 Initial Diagnosis    Prostate biopsied with highest Gleason score of 9 seen and the lowest score was 7.      10/04/2012 - 05/16/2013 Chemotherapy    Depo-Lupron and Casodex initiated      05/16/2013 -  Chemotherapy    Depo-Lupron monthly continued      05/16/2013 Progression    Progression by PSA elevation      05/16/2013 - 10/22/2014 Chemotherapy    Abiraterone and prednisone initiated in conjunction with ongoing Depo-Lupron.  Denosumab also ongoing.      10/23/2014 Progression    PSA increasing from 0.2- 1.6 in less than 6 months.        10/23/2014 - 01/30/2015 Chemotherapy    Enzalutamide and Prednisone (5 mg in AM and 2.5 mg in PM)      01/30/2015 Imaging    Bone scan- New focus of intense activity in right proximal humerus.  Interim increase in activity over left hip.      01/30/2015 Progression    Bone scan reveals new disease in right humerus consistent with progression of disease      01/31/2015 Imaging    Right humerus xray- blastic foci in proximal right humeral metaphysis and over right mid-humeral diaphysis.  No evidence of fracture      07/06/2015 Progression    Progression in multiple bones especially L hip and femurs      07/06/2015 Imaging    Bone scan- progressive multifocal osseous metastases in the right proximal femora and distal femoral shafts.  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 Tammi Klippel      11/23/2015 - 01/04/2016 Chemotherapy    The patient had  pegfilgrastim (NEULASTA ONPRO KIT) injection 6 mg, 6 mg, Subcutaneous, Once, 3 of 7 cycles  DOCEtaxel (TAXOTERE) 180 mg in dextrose 5 % 250 mL chemo infusion, 75 mg/m2 = 180 mg, Intravenous,  Once, 3 of 7 cycles Dose modification: 64 mg/m2 (original dose 75 mg/m2, Cycle 2, Reason: Dose not tolerated)  pegfilgrastim (NEULASTA ONPRO KIT) injection 6 mg, 6 mg, Subcutaneous, Once, 0 of 4 cycles  cabazitaxel (JEVTANA) 60 mg in dextrose 5 % 250 mL chemo infusion, 25 mg/m2, Intravenous,  Once, 0 of 4 cycles  for chemotherapy treatment.        11/30/2015 Adverse Reaction    Diarrhea (secondary to chemotherapy) and dehydration requiring IV fluids      12/14/2015 Treatment Plan Change    Docetaxel dose reduced by 15%      12/31/2015 Procedure    Port placed by Dr. Arnoldo Morale      01/28/2016 -  Chemotherapy    Cabazitaxel (Jevtana)       03/27/2016 Imaging    CT Chest, Abdomen, and Pelvis with contrast 1. Diffuse sclerotic osseous metastatic disease in the chest, abdomen, and pelvis without acute fracture identified. 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 These appear chronic. 2. The prostate gland is normal in size and  no adenopathy is currently identified. 3. On a prior MRI of 10/04/2012, there was a posterior right hepatic lobe lesion. This lesion is not currently visible on today' s CT. This could be due to differences in cons acuity between CT or MRI, or resolution of the lesion. 4. Coronary, aortic arch, and branch vessel atherosclerotic vascular disease. Aortoiliac atherosclerotic vascular disease. 5. Single bilateral renal cysts.      04/16/2016 Imaging    Bone scan- Findings consist with progressive metastatic disease. Activity over the proximal right humerus and proximal bilateral femurs are worrisome for the possible development of pathologic fractures.      08/21/2016 Imaging    CT C/A/P: IMPRESSION: No significant change since  03/27/2016 CT. Diffuse bony metastases without other evidence of metastatic disease.      08/21/2016 Imaging    Bone Scan: IMPRESSION: Multiple areas of increased activity again noted throughout the axial and appendicular skeleton in similar locations as prior exam. Intensity of uptake is increased from prior exam suggesting progressive disease. Lesions present in the proximal humeri, proximal femurs, and the mid right femur susceptible to pathologic fracture.      12/02/2016 Imaging    CT C/A/P: IMPRESSION: 1. Overall stable appearance of diffuse osseous metastatic disease and resulting patchy sclerosis. 2. The patient had a posterior right hepatic lobe lesion on prior MRI from 2014 which is been relatively occult on CT. Given the lack of progression I suspect that this is benign or has been effectively treated. 3. Aortic Atherosclerosis (ICD10-I70.0). Coronary atherosclerosis with mild cardiomegaly. 4. Cholelithiasis. 5. Bilateral benign renal cysts. 6. 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      12/02/2016 Imaging    Bone Scan: IMPRESSION: 1. Widespread osseous metastatic disease. Multiplicity is similar to previous exam with interval increase in degree of tracer uptake associated with multiple lesions.      03/11/2017 Imaging    Bone Scan: Multifocal skeletal disease without sign of progression CT C/A/P: Diffuse sclerotic skeletal lesions, unchanged from the previous.  No new lymphadenopathy, pulmonary nodules, or hepatic lesions to suggest soft tissue progressive disease        CANCER STAGING: Cancer Staging Prostate cancer metastatic to bone [facility]) Staging form: Prostate, AJCC 7th Edition - Clinical: No stage assigned - Unsigned    INTERVAL HISTORY:  Micheal Davis 72 y.o. male returns for  routine follow-up of biopsy results.  We have sent his tumor for PDL 1 and foundation 1 testing.  He has cut down some trees over the weekend.  Hence he is feeling somewhat tired.  His neuropathy has been stable.  He does not feel any chest pains or lightheadedness.   REVIEW OF SYSTEMS:  Review of Systems  Constitutional: Positive for fatigue.  Cardiovascular: Positive for leg swelling.  Gastrointestinal: Positive for constipation and nausea.  Neurological: Positive for numbness.  All other systems reviewed and are negative.    PAST MEDICAL/SURGICAL HISTORY:  Past Medical History:  Diagnosis Date  . Diabetes mellitus without complication ([place])   . Hypertension   . Prostate cancer ([place]) 09/05/2015  . Prostate cancer metastatic to bone ([place]) 09/05/2015  . Sleep apnea   . Thyroid disease    Past Surgical History:  Procedure Laterality Date  . CATARACT EXTRACTION    . PORTACATH PLACEMENT Left 12/31/2015   Procedure: INSERTION PORT-A-CATH LEFT SUBCLAVIAN;  Surgeon: Aviva Signs, MD;  Location: [facility];  Service: General;  Laterality: Left;  . REPLACEMENT TOTAL KNEE Left      SOCIAL HISTORY:  Social History   Socioeconomic History  . Marital status: Married    Spouse name: Not on file  . Number of children: Not on file  . Years of education: Not on file  . Highest education level: Not on file  Occupational History  . Not on file  Social Needs  . Financial resource strain: Not on file  . Food insecurity:    Worry: Not on file    Inability: Not on file  . Transportation needs:    Medical: Not on file    Non-medical: Not on file  Tobacco Use  . Smoking status: Never Smoker  . Smokeless tobacco: Never Used  Substance and Sexual Activity  . Alcohol use: Yes    Comment: 1 beer each month  . Drug use: No  . Sexual activity: Never    Comment: married  Lifestyle  . Physical activity:    Days per week: Not on file    Minutes per session: Not on file  . Stress: Not on file    Relationships  . Social connections:    Talks on phone: Not on file    Gets together: Not on file    Attends religious service: Not on file    Active member of club or organization: Not on file    Attends meetings of clubs or organizations: Not on file    Relationship status: Not on file  . Intimate partner violence:    Fear of current or ex partner: Not on file    Emotionally abused: Not on file    Physically abused: Not on file    Forced sexual activity: Not on file  Other Topics Concern  . Not on file  Social History Narrative  . Not on file    FAMILY HISTORY:  History reviewed. No pertinent family history.  CURRENT MEDICATIONS:  Outpatient Encounter Medications as of 06/08/2017  Medication Sig Note  . atenolol (TENORMIN) 100 MG tablet Take 100 mg by mouth daily.   Marland Kitchen atorvastatin (LIPITOR) 20 MG tablet Take 10 mg by mouth daily.   . calcium carbonate (OS-CAL - DOSED IN MG OF ELEMENTAL CALCIUM) 1250 (500 Ca) MG tablet Take 1 tablet by mouth 2 (two) times daily.    Marland Kitchen denosumab (XGEVA) 120 MG/1.7ML SOLN injection Inject 120 mg into the skin every 28 (twenty-eight) days. 05/18/2017: On hold per MD  . diltiazem (CARDIZEM CD) 300 MG 24 hr capsule Take 300 mg by mouth daily.   Marland Kitchen gabapentin (NEURONTIN) 300 MG capsule Take 1 capsule (300 mg total) by mouth 3 (three) times daily. (Patient taking differently: Take 300-600 mg by mouth See admin instructions. Take 300 mg by mouth in the morning and take 600 mg by mouth at bedtime)   . Glucosamine-Chondroit-Vit C-Mn (GLUCOSAMINE 1500 COMPLEX PO) Take 1 tablet by mouth 2 (two) times daily.    Marland Kitchen KLOR-CON M20 20 MEQ tablet TAKE 1 TABLET BY MOUTH ONCE DAILY (Patient taking differently: TAKE 20 MEQ BY MOUTH ONCE DAILY)   . leuprolide (LUPRON) 7.5 MG injection Inject 7.5 mg into the muscle every 28 (twenty-eight) days.   Marland Kitchen levothyroxine (SYNTHROID, LEVOTHROID) 25 MCG tablet Take 25 mcg by mouth daily before breakfast.   . loperamide (IMODIUM) 1  MG/5ML solution Take 1 mg by mouth as needed for diarrhea or loose stools.   Marland Kitchen  Lysine 500 MG CAPS Take 500 mg by mouth daily.   . magic mouthwash w/lidocaine SOLN 1 part of each of the following: Benadryl 12.29m /53m Viscous lidocaine 2%, Maalox. Swish and swallow 5 mL QID. (Patient taking differently: Take 5 mLs by mouth 4 (four) times daily as needed for mouth pain. Benadryl 12.46m71m46ml3miscous lidocaine 2%, Maalox.)   . metFORMIN (GLUCOPHAGE) 500 MG tablet Take 500 mg by mouth 2 (two) times daily with a meal.   . morphine (MS CONTIN) 60 MG 12 hr tablet Take 1 tablet (60 mg total) by mouth every 12 (twelve) hours.   . moMarland Kitchenphine (MSIR) 30 MG tablet Take 1 tablet (30 mg total) by mouth every 4 (four) hours as needed for severe pain.   . Multiple Vitamin (MULTIVITAMIN WITH MINERALS) TABS tablet Take 1 tablet by mouth daily.   . Omega-3 Fatty Acids (FISH OIL PO) Take 1 capsule by mouth daily.   . ondansetron (ZOFRAN) 8 MG tablet Take 1 tablet (8 mg total) by mouth 2 (two) times daily as needed (Nausea or vomiting). (Patient taking differently: Take 8 mg by mouth 2 (two) times daily as needed for nausea or vomiting. )   . ondansetron (ZOFRAN-ODT) 8 MG disintegrating tablet Take 1 tablet (8 mg total) by mouth every 8 (eight) hours as needed for nausea or vomiting.   . predniSONE (DELTASONE) 5 MG tablet Take 5 mg by mouth 2 (two) times daily with a meal.   . prochlorperazine (COMPAZINE) 10 MG tablet Take 1 tablet (10 mg total) by mouth every 6 (six) hours as needed (Nausea or vomiting). (Patient taking differently: Take 10 mg by mouth every 6 (six) hours as needed for nausea or vomiting. )   . tamsulosin (FLOMAX) 0.4 MG CAPS capsule Take 0.8 mg by mouth daily.    . trMarland Kitchenamterene-hydrochlorothiazide (MAXZIDE-25) 37.5-25 MG tablet Take 1 tablet by mouth daily. for high blood pressure    Facility-Administered Encounter Medications as of 06/08/2017  Medication  . leuprolide (LUPRON) injection 7.5 mg     ALLERGIES:  No Known Allergies   PHYSICAL EXAM:  ECOG Performance status: 1  Vitals:   06/08/17 1502  BP: 132/78  Pulse: 68  Resp: 16  SpO2: 98%   Filed Weights   06/08/17 1502  Weight: 225 lb 8 oz (102.3 kg)      LABORATORY DATA:  I have reviewed the labs as listed.  CBC    Component Value Date/Time   WBC 6.2 05/22/2017 1138   RBC 3.53 (L) 05/22/2017 1138   HGB 10.3 (L) 05/22/2017 1138   HCT 32.5 (L) 05/22/2017 1138   PLT 197 05/22/2017 1138   MCV 92.1 05/22/2017 1138   MCH 29.2 05/22/2017 1138   MCHC 31.7 05/22/2017 1138   RDW 16.0 (H) 05/22/2017 1138   LYMPHSABS 0.6 (L) 05/01/2017 1406   MONOABS 0.6 05/01/2017 1406   EOSABS 0.0 05/01/2017 1406   BASOSABS 0.0 05/01/2017 1406   CMP Latest Ref Rng & Units 05/01/2017 04/29/2017 04/23/2017  Glucose 65 - 99 mg/dL 165(H) 173(H) 150(H)  BUN 6 - 20 mg/dL '11 11 11  ' Creatinine 0.61 - 1.24 mg/dL 0.82 0.74 0.74  Sodium 135 - 145 mmol/L 126(L) 139 141  Potassium 3.5 - 5.1 mmol/L 3.7 4.0 3.5  Chloride 101 - 111 mmol/L 88(L) 102 105  CO2 22 - 32 mmol/L '23 23 24  ' Calcium 8.9 - 10.3 mg/dL 9.0 9.6 8.5(L)  Total Protein 6.5 - 8.1 g/dL - 6.8 6.3(L)  Total  Bilirubin 0.3 - 1.2 mg/dL - 0.5 0.7  Alkaline Phos 38 - 126 U/L - 139(H) 182(H)  AST 15 - 41 U/L - 32 32  ALT 17 - 63 U/L - 18 18       ASSESSMENT & PLAN:   Prostate cancer metastatic to bone ([facility]) 1.  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 - BRCA 1/2 negative, PDL 1 expression of 0% -Foundation 1 testing results pending -Most recent progression on cabazitaxel.  He has used [place] in the past.  He had received 2 cycles of docetaxel in the beginning.  It is unclear whether this was discontinued secondary to progression of disease.  Functionally he is doing very well.  He will come back in 1 week to follow-up on the foundation one results.  We will also check into clinical trials at  [facility].  2.  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  This is from prior chemotherapy.  He takes gabapentin 1 tablet in the morning and 2 tablets at bedtime.      Orders placed this encounter:  No orders of the defined types were placed in this encounter.     Derek Jack, MD [place] [phone]

## 2017-06-09 ENCOUNTER — Ambulatory Visit (HOSPITAL_COMMUNITY): Payer: Medicare Other

## 2017-06-09 ENCOUNTER — Other Ambulatory Visit: Payer: Self-pay

## 2017-06-09 ENCOUNTER — Encounter (HOSPITAL_COMMUNITY): Payer: Self-pay

## 2017-06-09 DIAGNOSIS — M6281 Muscle weakness (generalized): Secondary | ICD-10-CM

## 2017-06-09 DIAGNOSIS — R2689 Other abnormalities of gait and mobility: Secondary | ICD-10-CM | POA: Diagnosis not present

## 2017-06-09 DIAGNOSIS — Z9181 History of falling: Secondary | ICD-10-CM

## 2017-06-09 NOTE — Therapy (Signed)
[place], [place], [address] Phone: [phone]   Fax:  [phone]  Physical Therapy Treatment  Patient Details  Name: Micheal Davis MRN: [id number] Date of Birth: September 23, 1945 Referring Provider: Creola Corn, MD   Encounter Date: 06/09/2017  PT End of Session - 06/09/17 0854    Visit Number  9    Number of Visits  19    Date for PT Re-Evaluation  06/22/17    Authorization Type  Medicare Part A and B    Authorization Time Period  04/20/17 - 06/01/17; 06/01/17-06/22/17    Authorization - Visit Number  3    Authorization - Number of Visits  10    PT Start Time  0816    PT Stop Time  0857    PT Time Calculation (min)  41 min    Activity Tolerance  Patient tolerated treatment well    Behavior During Therapy  [facility] for tasks assessed/performed       Past Medical History:  Diagnosis Date  . Diabetes mellitus without complication ()   . Hypertension   . Prostate cancer ([place]) 09/05/2015  . Prostate cancer metastatic to bone ([place]) 09/05/2015  . Sleep apnea   . Thyroid disease     Past Surgical History:  Procedure Laterality Date  . CATARACT EXTRACTION    . PORTACATH PLACEMENT Left 12/31/2015   Procedure: INSERTION PORT-A-CATH LEFT SUBCLAVIAN;  Surgeon: Aviva Signs, MD;  Location: [facility];  Service: General;  Laterality: Left;  . REPLACEMENT TOTAL KNEE Left     There were no vitals filed for this visit.  Subjective Assessment - 06/09/17 0820    Subjective  [name] states he went to his oncologist yesterday and they are doing further testing to determine the best plan for treatment. he reprots he has not been as consitant with His HEP lately and he has been feelign more tired recently. He also reports he had a tick on him after workign in the yard last week.     Pertinent History  Stage 4 prostate cancer, Left TKA    Limitations  Walking;House hold activities    How long can you sit comfortably?  unlimited     How long can you stand comfortably?  severalm inutes before he becomes too fatigued and needs to sit to rest    How long can you walk comfortably?  ~5 minutes before too tired to keep walking    Patient Stated Goals  get stronger and have more energy to walk or do activites for longer amounts of time    Currently in Pain?  Yes    Pain Score  4     Pain Location  Generalized hips, low back, knees    Pain Descriptors / Indicators  Aching    Pain Type  Chronic pain    Pain Onset  More than a month ago    Pain Frequency  Intermittent         [facility] Adult PT Treatment/Exercise - 06/09/17 0001      Knee/Hip Exercises: Aerobic   Nustep  2x 31mnutes with 1 minute rest break between for endurance, UE's and LE's      Knee/Hip Exercises: Standing   Heel Raises  Both;1 set;20 reps;Limitations    Heel Raises Limitations  1x 20 reps Toe raises       Balance Exercises - 06/09/17 0823      Balance Exercises: Standing   Tandem Stance  Foam/compliant  surface;30 secs;4 reps;Eyes open alteranting foot alignment    SLS with Vectors  Solid surface;Limitations;Upper extremity assist 1;2 reps 10x 3 way vector each, 2x Bil LE    Tandem Gait  Forward;Limitations;4 reps 15 feet    Retro Gait  1 rep 15 feet    Sidestepping  Foam/compliant support;4 reps;Limitations 2x each direction    Step Over Hurdles / Cones  lateral step over  Two 6" hurdle; 1x 10 reps each direction    Other Standing Exercises  Warrior pose 1; 2x eash LE 30 seconds: greater difficulty with Rt foot back        PT Education - 06/09/17 0853    Education provided  Yes    Education Details  Educated on balance at home and importance of HEP participation. Educated on exercises throughout session. Updated HEP with warrior pose.    Person(s) Educated  Patient    Methods  Explanation    Comprehension  Verbalized understanding       PT Short Term Goals - 06/01/17 0839      PT SHORT TERM GOAL #1   Title  Patient will be independent  with HEP    Baseline  05/18/17 - limited by sickness secondary to cemotherapy    Time  3    Period  Weeks    Status  Achieved      PT SHORT TERM GOAL #2   Title  Patient will improve MMT for limited groups by 1/2 grade to demonstrate improved functional strength for improved performance with stairs and gait.    Baseline  05/18/17 - all 1/2 improved    Time  3    Period  Weeks    Status  Achieved      PT SHORT TERM GOAL #3   Title  Patient will demosntrate improved balance by performing SLS for Bil LE for 10 seconds each to imprvoe performance to ascend/descend stairs with decreased risk of falling.    Baseline  05/18/17 - Rt EL = 5 sec, Lt LE =14 sec    Time  3    Period  Weeks    Status  Partially Met    Target Date  06/22/17        PT Long Term Goals - 06/01/17 0840      PT LONG TERM GOAL #1   Title  Patient will improve MMT for limited groups by 1 grade to demonstrate improved functional strength for improved performance with stairs and gait.    Baseline  05/18/17 - all 1/2 improved    Time  3    Period  Weeks    Status  Partially Met    Target Date  06/22/17      PT LONG TERM GOAL #2   Title  Patient will perform 5x sit to stand in = or <12 seconds to demonstrate improve functional LE strength and to decrease risk of falling.    Baseline  06/01/17 - 16 seconds, no UE    Time  3    Period  Weeks    Status  On-going    Target Date  06/22/17      PT LONG TERM GOAL #3   Title  Patient will score =>20/24 on the DGI to demonstrate significant improvement in balance during gait and decrease risk of falling.    Baseline  06/01/17 - 20/24    Time  3    Period  Weeks    Status  Achieved  Target Date  06/22/17        Plan - 06/09/17 0854    Clinical Impression Statement  Patient reports increased fatigue recently and states he has not been participating in his HEP as regularly. He also reports he is waiting on testing from his oncologist to determine the best treatment plan.  Today's session continued to focus on balance training, SLS, and endurance training. HEP was updated today with endurance /balance exercise and patient was instructed to perform it at least every other day with current HEP. Patient will continue to benefit from skilled PT services to address deficits and improve functional mobility to decrease risk of falling.    Rehab Potential  Good    PT Frequency  2x / week    PT Duration  3 weeks    PT Treatment/Interventions  ADLs/Self Care Home Management;Cryotherapy;Electrical Stimulation;Moist Heat;Gait training;DME Instruction;Stair training;Functional mobility training;Therapeutic activities;Therapeutic exercise;Balance training;Neuromuscular re-education;Patient/family education;Energy conservation    PT Next Visit Plan  F/U with balance training of warrior pose as part of HEP. Continue with tandem gait and rerto grait for balance training. Initiate half knee to stand transfer training. Continue proxmial hip strengthening and singel limb balance training, progressing as able. Perform step ups and downs on lowe step for strength next session.     PT Home Exercise Plan  Eval: bridge; 2/20: SLR, SLS; 05/25/17 - marching at counter; 05/28/17 - tandem at counter; 4/1/ 19 -sit to stands;     Consulted and Agree with Plan of Care  Patient       Patient will benefit from skilled therapeutic intervention in order to improve the following deficits and impairments:  Abnormal gait, Decreased activity tolerance, Decreased endurance, Decreased strength, Decreased balance, Decreased mobility, Difficulty walking, Obesity, Improper body mechanics, Impaired sensation  Visit Diagnosis: Other abnormalities of gait and mobility  Muscle weakness (generalized)  History of falling     Problem List Patient Active Problem List   Diagnosis Date Noted  . Gastroesophageal reflux disease 03/13/2016  . Prostate cancer metastatic to bone ([place]) 09/05/2015  . Nausea 08/21/2015   . SOB (shortness of breath) on exertion 08/21/2015  . 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 Osteoarthritis of right knee 01/16/2014    Kipp Brood, PT, DPT Physical Therapist with [facility]  06/09/2017 9:12 AM    [facility] [address] [place], [place], [address] Phone: [phone]   Fax:  [phone]  Name: Micheal Davis MRN: [id number] Date of Birth: Sep 08, 1945

## 2017-06-09 NOTE — Patient Instructions (Signed)
   Warrior One: 1-2 times with each foot forward (10-20 seconds each side)  Stand with one foot back hip width distance about 2-3 feet. Turn the back foot out about 45 degrees for stability.  Keeping the hips square, place your hands forward onto the wall or a chair to help stabilize your core and square the hips. Pull the belly gently towards the spine and drop the tailbone towards the floor. Keep one hand on the counter for safety.  Hold the pose for 10-20 seconds.

## 2017-06-11 ENCOUNTER — Ambulatory Visit (HOSPITAL_COMMUNITY): Payer: Medicare Other

## 2017-06-11 ENCOUNTER — Encounter (HOSPITAL_COMMUNITY): Payer: Self-pay

## 2017-06-11 DIAGNOSIS — M6281 Muscle weakness (generalized): Secondary | ICD-10-CM | POA: Diagnosis not present

## 2017-06-11 DIAGNOSIS — Z9181 History of falling: Secondary | ICD-10-CM

## 2017-06-11 DIAGNOSIS — R2689 Other abnormalities of gait and mobility: Secondary | ICD-10-CM

## 2017-06-11 NOTE — Therapy (Signed)
[place], [place], [address] Phone: [phone]   Fax:  [phone]  Physical Therapy Treatment  Patient Details  Name: Micheal Davis MRN: [id number] Date of Birth: [birth year] Referring Provider: Creola Corn, MD   Encounter Date: 06/11/2017  PT End of Session - 06/11/17 0836    Visit Number  10    Number of Visits  19    Date for PT Re-Evaluation  06/22/17 minireassessment complete 06/01/2017    Authorization Type  Medicare Part A and B    Authorization Time Period  04/20/17 - 06/01/17; 06/01/17-06/22/17    Authorization - Visit Number  4    Authorization - Number of Visits  10    PT Start Time  0817    PT Stop Time  0906 8' with Nustep and completing FOTO, no included wiht charges    PT Time Calculation (min)  49 min    Equipment Utilized During Treatment  Gait belt    Activity Tolerance  Patient tolerated treatment well fatigue level increased from 3/10 to 5/10 at EOS    Behavior During Therapy  [facility] for tasks assessed/performed       Past Medical History:  Diagnosis Date  . Diabetes mellitus without complication ([place])   . Hypertension   . Prostate cancer ([place]) 09/05/2015  . Prostate cancer metastatic to bone ([place]) 09/05/2015  . Sleep apnea   . Thyroid disease     Past Surgical History:  Procedure Laterality Date  . CATARACT EXTRACTION    . PORTACATH PLACEMENT Left 12/31/2015   Procedure: INSERTION PORT-A-CATH LEFT SUBCLAVIAN;  Surgeon: Aviva Signs, MD;  Location: [facility];  Service: General;  Laterality: Left;  . REPLACEMENT TOTAL KNEE Left     There were no vitals filed for this visit.  Subjective Assessment - 06/11/17 0819    Subjective  Pt stated he feels as though he pulled a groin yesterday, increased soreness Bil hip pain Rt>Lt, pain scale 4/10 today.      Patient Stated Goals  get stronger and have more energy to walk or do activites for longer amounts of time    Currently in Pain?  Yes    Pain Score  4     Pain Location  Leg Rt groin and hips    Pain Orientation  Right;Left    Pain Descriptors / Indicators  Sore    Pain Type  Chronic pain    Pain Onset  More than a month ago    Pain Frequency  Intermittent    Pain Relieving Factors  been improving gradually         [facility] PT Assessment - 06/11/17 0001      Assessment   Medical Diagnosis  Gait and Balance abnormalities    Referring Provider  Creola Corn, MD    Next MD Visit  Monday appointment with Dr. Raliegh Ip    Prior Therapy  8-9 years ago for Lt TKA      Precautions   Precautions  Fall      Observation/Other Assessments   Focus on Therapeutic Outcomes (FOTO)   41% limitations  complete 10th session on 06/11/2017                   [facility] Adult PT Treatment/Exercise - 06/11/17 0001      Knee/Hip Exercises: Aerobic   Nustep  4' L1 with UE/LE for endurance training, FOTO complete during not included wiht billing  Knee/Hip Exercises: Standing   Heel Raises  Both;1 set;20 reps;Limitations    Heel Raises Limitations  1x 20 reps Toe raises          Balance Exercises - 06/11/17 0833      Balance Exercises: Standing   Tandem Stance  Eyes open;Foam/compliant surface;3 reps;30 secs BLE last set with UE flexion 1# dowel rod    SLS  Eyes open;5 reps 4 cone tapping BLE    SLS with Vectors  Solid surface;Limitations;Upper extremity assist 1;2 reps    Tandem Gait  Forward;2 reps    Retro Gait  2 reps    Sidestepping  Foam/compliant support;4 reps;Limitations;Theraband RTB    Step Over Hurdles / Cones  4way step over dowel rods on floot 3RT each direction          PT Short Term Goals - 06/01/17 0839      PT SHORT TERM GOAL #1   Title  Patient will be independent with HEP    Baseline  05/18/17 - limited by sickness secondary to cemotherapy    Time  3    Period  Weeks    Status  Achieved      PT SHORT TERM GOAL #2   Title  Patient will improve MMT for limited groups by 1/2 grade to  demonstrate improved functional strength for improved performance with stairs and gait.    Baseline  05/18/17 - all 1/2 improved    Time  3    Period  Weeks    Status  Achieved      PT SHORT TERM GOAL #3   Title  Patient will demosntrate improved balance by performing SLS for Bil LE for 10 seconds each to imprvoe performance to ascend/descend stairs with decreased risk of falling.    Baseline  05/18/17 - Rt EL = 5 sec, Lt LE =14 sec    Time  3    Period  Weeks    Status  Partially Met    Target Date  06/22/17        PT Long Term Goals - 06/01/17 0840      PT LONG TERM GOAL #1   Title  Patient will improve MMT for limited groups by 1 grade to demonstrate improved functional strength for improved performance with stairs and gait.    Baseline  05/18/17 - all 1/2 improved    Time  3    Period  Weeks    Status  Partially Met    Target Date  06/22/17      PT LONG TERM GOAL #2   Title  Patient will perform 5x sit to stand in = or <12 seconds to demonstrate improve functional LE strength and to decrease risk of falling.    Baseline  06/01/17 - 16 seconds, no UE    Time  3    Period  Weeks    Status  On-going    Target Date  06/22/17      PT LONG TERM GOAL #3   Title  Patient will score =>20/24 on the DGI to demonstrate significant improvement in balance during gait and decrease risk of falling.    Baseline  06/01/17 - 20/24    Time  3    Period  Weeks    Status  Achieved    Target Date  06/22/17            Plan - 06/11/17 8270    Clinical Impression Statement  Session focus on balance  training wiht increased focus on SLS to improve balance during gait.  Added 4 way stepage to improve SLS and gluteal strengthenig.  Pt continues to fatigue easily with multiple seated rest breaks through session.  EOS on Nustep for endurance training, increased time wihtout rest breaks this session.  FOTO was complete while on nustep with 41% limtations.  No reports of LE pain at EOS, did report  increased fatigue from 3/10 to 5/10,.    Rehab Potential  Good    PT Frequency  2x / week    PT Duration  3 weeks    PT Treatment/Interventions  ADLs/Self Care Home Management;Cryotherapy;Electrical Stimulation;Moist Heat;Gait training;DME Instruction;Stair training;Functional mobility training;Therapeutic activities;Therapeutic exercise;Balance training;Neuromuscular re-education;Patient/family education;Energy conservation    PT Next Visit Plan  F/U with balance training of warrior pose as part of HEP. Continue with tandem gait and rerto grait for balance training. Initiate half knee to stand transfer training. Continue proxmial hip strengthening and singel limb balance training, progressing as able. Perform step ups and downs on lowe step for strength next session.     PT Home Exercise Plan  Eval: bridge; 2/20: SLR, SLS; 05/25/17 - marching at counter; 05/28/17 - tandem at counter; 4/1/ 19 -sit to stands;        Patient will benefit from skilled therapeutic intervention in order to improve the following deficits and impairments:  Abnormal gait, Decreased activity tolerance, Decreased endurance, Decreased strength, Decreased balance, Decreased mobility, Difficulty walking, Obesity, Improper body mechanics, Impaired sensation  Visit Diagnosis: Other abnormalities of gait and mobility  Muscle weakness (generalized)  History of falling     Problem List Patient Active Problem List   Diagnosis Date Noted  . Gastroesophageal reflux disease 03/13/2016  . Prostate cancer metastatic to bone ([place]) 09/05/2015  . Nausea 08/21/2015  . SOB (shortness of breath) on exertion 08/21/2015  . 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 Osteoarthritis of right knee 01/16/2014   Ihor Austin, LPTA; [place]  Aldona Lento 06/11/2017, 9:27 AM  [place],  [place], [address] Phone: [phone]   Fax:  [phone]  Name: Micheal Davis MRN: [id number] Date of Birth: 02/14/1946

## 2017-06-12 DIAGNOSIS — C7951 Secondary malignant neoplasm of bone: Secondary | ICD-10-CM | POA: Diagnosis not present

## 2017-06-12 DIAGNOSIS — C61 Malignant neoplasm of prostate: Secondary | ICD-10-CM | POA: Diagnosis not present

## 2017-06-15 DIAGNOSIS — R748 Abnormal levels of other serum enzymes: Secondary | ICD-10-CM | POA: Diagnosis not present

## 2017-06-15 DIAGNOSIS — C61 Malignant neoplasm of prostate: Secondary | ICD-10-CM | POA: Diagnosis not present

## 2017-06-15 DIAGNOSIS — G4733 Obstructive sleep apnea (adult) (pediatric): Secondary | ICD-10-CM | POA: Diagnosis not present

## 2017-06-15 DIAGNOSIS — M1991 Primary osteoarthritis, unspecified site: Secondary | ICD-10-CM | POA: Diagnosis not present

## 2017-06-15 DIAGNOSIS — I10 Essential (primary) hypertension: Secondary | ICD-10-CM | POA: Diagnosis not present

## 2017-06-15 DIAGNOSIS — E039 Hypothyroidism, unspecified: Secondary | ICD-10-CM | POA: Diagnosis not present

## 2017-06-15 DIAGNOSIS — E782 Mixed hyperlipidemia: Secondary | ICD-10-CM | POA: Diagnosis not present

## 2017-06-15 DIAGNOSIS — E119 Type 2 diabetes mellitus without complications: Secondary | ICD-10-CM | POA: Diagnosis not present

## 2017-06-16 ENCOUNTER — Ambulatory Visit (HOSPITAL_COMMUNITY): Payer: Medicare Other

## 2017-06-16 ENCOUNTER — Other Ambulatory Visit: Payer: Self-pay

## 2017-06-16 ENCOUNTER — Encounter (HOSPITAL_COMMUNITY): Payer: Self-pay

## 2017-06-16 DIAGNOSIS — R2689 Other abnormalities of gait and mobility: Secondary | ICD-10-CM

## 2017-06-16 DIAGNOSIS — M6281 Muscle weakness (generalized): Secondary | ICD-10-CM | POA: Diagnosis not present

## 2017-06-16 DIAGNOSIS — Z9181 History of falling: Secondary | ICD-10-CM

## 2017-06-16 NOTE — Therapy (Signed)
[place], [place], [address] Phone: [phone]   Fax:  [phone]  Physical Therapy Treatment  Patient Details  Name: Micheal Davis MRN: [id number] Date of Birth: Jul 19, 1945 Referring Provider: Creola Corn, MD   Encounter Date: 06/16/2017  PT End of Session - 06/16/17 0821    Visit Number  11    Number of Visits  19    Date for PT Re-Evaluation  06/22/17 minireassessment complete 06/01/2017    Authorization Type  Medicare Part A and B    Authorization Time Period  04/20/17 - 06/01/17; 06/01/17-06/22/17    Authorization - Visit Number  5    Authorization - Number of Visits  10    PT Start Time  0817    PT Stop Time  0900 6 minutes at EOS additional to ride Nustep (not billed)    PT Time Calculation (min)  43 min    Equipment Utilized During Treatment  Gait belt    Activity Tolerance  Patient tolerated treatment well fatigue level increased from 3/10 to 5/10 at EOS    Behavior During Therapy  [facility] for tasks assessed/performed       Past Medical History:  Diagnosis Date  . Diabetes mellitus without complication ([place])   . Hypertension   . Prostate cancer ([place]) 09/05/2015  . Prostate cancer metastatic to bone ([place]) 09/05/2015  . Sleep apnea   . Thyroid disease     Past Surgical History:  Procedure Laterality Date  . CATARACT EXTRACTION    . PORTACATH PLACEMENT Left 12/31/2015   Procedure: INSERTION PORT-A-CATH LEFT SUBCLAVIAN;  Surgeon: Aviva Signs, MD;  Location: [facility];  Service: General;  Laterality: Left;  . REPLACEMENT TOTAL KNEE Left     There were no vitals filed for this visit.  Subjective Assessment - 06/16/17 0821    Subjective  Patient reports he is going to see his oncologist on Thursday to discuss a new treatment plan for his ongoing cancer. He states they were waiting for one final test result and they should have it then. He reports his back bothered him yesterday and a lot last night and  his hips and right knee has been bothering him a lot so he did not perform his HEP the last couple days.    Pertinent History  Stage 4 prostate cancer, Left TKA    Limitations  Walking;House hold activities    Patient Stated Goals  get stronger and have more energy to walk or do activites for longer amounts of time    Currently in Pain?  Yes    Pain Score  3     Pain Location  Leg hips/groin, and right knee    Pain Orientation  Right;Left    Pain Descriptors / Indicators  Aching;Tightness    Pain Type  Chronic pain    Pain Onset  More than a month ago    Pain Frequency  Intermittent       [facility] Adult PT Treatment/Exercise - 06/16/17 0001      Knee/Hip Exercises: Aerobic   Nustep  6' L1 with UE/LE for endurance training EOS not billable      Knee/Hip Exercises: Standing   Heel Raises  Both;1 set;20 reps;Limitations    Heel Raises Limitations  1x 20 reps Toe raises    Lateral Step Up  Both;Step Height: 4";15 reps    Forward Step Up  Both;Step Height: 4";15 reps;1 set    Step Down  Hand Hold: 1;Step Height: 2";10 reps;Both;1 set        Balance Exercises - 06/16/17 0829      Balance Exercises: Standing   SLS  Eyes open;2 reps;Solid surface;Intermittent upper extremity support 3 cone taps x 10. Bil LE    Stepping Strategy  Anterior;Posterior;Lateral 4way step over dowel rods on floot 5RT each direction    Tandem Gait  Forward;2 reps    Partial Tandem Stance  Eyes open;Foam/compliant surface;Limitations;4 reps;30 secs alternate foot alignment, soft ball toss    Sidestepping  Foam/compliant support;4 reps;Limitations;Theraband 2x each direction    Step Over Hurdles / Cones  lateral step over  Two 6" hurdle; 1x 10 reps each direction        PT Education - 06/16/17 0931    Education provided  Yes    Education Details  educated on exercise throughout. Educated on HEP compliance and appropriate performance of 3-4 exercises each day to not be overwhelmed by amount of exercises.  Discussed local fitness facilities and programs that patient could benefit from  participating in (silver sneakers, [employer] exercise classes)    Person(s) Educated  Patient    Methods  Explanation    Comprehension  Verbalized understanding       PT Short Term Goals - 06/01/17 0839      PT SHORT TERM GOAL #1   Title  Patient will be independent with HEP    Baseline  05/18/17 - limited by sickness secondary to cemotherapy    Time  3    Period  Weeks    Status  Achieved      PT SHORT TERM GOAL #2   Title  Patient will improve MMT for limited groups by 1/2 grade to demonstrate improved functional strength for improved performance with stairs and gait.    Baseline  05/18/17 - all 1/2 improved    Time  3    Period  Weeks    Status  Achieved      PT SHORT TERM GOAL #3   Title  Patient will demosntrate improved balance by performing SLS for Bil LE for 10 seconds each to imprvoe performance to ascend/descend stairs with decreased risk of falling.    Baseline  05/18/17 - Rt EL = 5 sec, Lt LE =14 sec    Time  3    Period  Weeks    Status  Partially Met    Target Date  06/22/17        PT Long Term Goals - 06/01/17 0840      PT LONG TERM GOAL #1   Title  Patient will improve MMT for limited groups by 1 grade to demonstrate improved functional strength for improved performance with stairs and gait.    Baseline  05/18/17 - all 1/2 improved    Time  3    Period  Weeks    Status  Partially Met    Target Date  06/22/17      PT LONG TERM GOAL #2   Title  Patient will perform 5x sit to stand in = or <12 seconds to demonstrate improve functional LE strength and to decrease risk of falling.    Baseline  06/01/17 - 16 seconds, no UE    Time  3    Period  Weeks    Status  On-going    Target Date  06/22/17      PT LONG TERM GOAL #3   Title  Patient will score =>20/24 on the DGI  to demonstrate significant improvement in balance during gait and decrease risk of falling.    Baseline  06/01/17 - 20/24     Time  3    Period  Weeks    Status  Achieved    Target Date  06/22/17         Plan - 06/16/17 0821    Clinical Impression Statement  Focus this session placed on hip strengthening and balance interventions. Patient continues to have greater difficulty with Rt LE SLS and this is likely limited by Rt knee pain. He demonstrated improved tandem balance with ball toss to increase dynamic challenges and had improved balance during tandem gait with repetition. He reports he will follow up with his oncologist this Thursday to determine appropriate treatment plan for his prostate cancer. I educated him on importance of HEP participation and he has shown interest in transitioning to a regular exercise program/routine at a local facility and will benefit from further education on available local health/fitness resources. Patient will continue to benefit from skilled PT services to address deficits and improve functional mobility to decrease risk of falling.    Rehab Potential  Good    PT Frequency  2x / week    PT Duration  3 weeks    PT Treatment/Interventions  ADLs/Self Care Home Management;Cryotherapy;Electrical Stimulation;Moist Heat;Gait training;DME Instruction;Stair training;Functional mobility training;Therapeutic activities;Therapeutic exercise;Balance training;Neuromuscular re-education;Patient/family education;Energy conservation    PT Next Visit Plan  Continue with tandem gait and retro gait for balance training. Initiate half knee to stand transfer training. Continue proximal hip strengthening and single limb balance training, progressing as able. Perform step ups and downs on low step for strength next session. Perform manual to Rt knee to address pain and determine if this improves balance.    PT Home Exercise Plan  Eval: bridge; 2/20: SLR, SLS; 05/25/17 - marching at counter; 05/28/17 - tandem at counter; 4/1/ 19 -sit to stands;     Consulted and Agree with Plan of Care  Patient        Patient will benefit from skilled therapeutic intervention in order to improve the following deficits and impairments:  Abnormal gait, Decreased activity tolerance, Decreased endurance, Decreased strength, Decreased balance, Decreased mobility, Difficulty walking, Obesity, Improper body mechanics, Impaired sensation  Visit Diagnosis: Other abnormalities of gait and mobility  Muscle weakness (generalized)  History of falling     Problem List Patient Active Problem List   Diagnosis Date Noted  . Gastroesophageal reflux disease 03/13/2016  . Prostate cancer metastatic to bone ([place]) 09/05/2015  . Nausea 08/21/2015  . SOB (shortness of breath) on exertion 08/21/2015  . 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 Osteoarthritis of right knee 01/16/2014    Kipp Brood, PT, DPT Physical Therapist with [facility]  06/16/2017 9:40 AM     [place], [place], [address] Phone: [phone]   Fax:  [phone]  Name: NAHUM SHERRER MRN: [id number] Date of Birth: 1946/02/23

## 2017-06-18 ENCOUNTER — Inpatient Hospital Stay (HOSPITAL_BASED_OUTPATIENT_CLINIC_OR_DEPARTMENT_OTHER): Payer: Medicare Other | Admitting: Hematology

## 2017-06-18 ENCOUNTER — Encounter (HOSPITAL_COMMUNITY): Payer: Medicare Other

## 2017-06-18 ENCOUNTER — Other Ambulatory Visit: Payer: Self-pay

## 2017-06-18 ENCOUNTER — Encounter (HOSPITAL_COMMUNITY): Payer: Self-pay | Admitting: Hematology

## 2017-06-18 VITALS — BP 143/68 | HR 58 | Temp 99.0°F | Resp 18 | Ht 70.0 in | Wt 222.0 lb

## 2017-06-18 DIAGNOSIS — I10 Essential (primary) hypertension: Secondary | ICD-10-CM | POA: Diagnosis not present

## 2017-06-18 DIAGNOSIS — C787 Secondary malignant neoplasm of liver and intrahepatic bile duct: Secondary | ICD-10-CM | POA: Diagnosis not present

## 2017-06-18 DIAGNOSIS — Z6831 Body mass index (BMI) 31.0-31.9, adult: Secondary | ICD-10-CM | POA: Diagnosis not present

## 2017-06-18 DIAGNOSIS — G629 Polyneuropathy, unspecified: Secondary | ICD-10-CM

## 2017-06-18 DIAGNOSIS — Z0001 Encounter for general adult medical examination with abnormal findings: Secondary | ICD-10-CM | POA: Diagnosis not present

## 2017-06-18 DIAGNOSIS — G609 Hereditary and idiopathic neuropathy, unspecified: Secondary | ICD-10-CM | POA: Diagnosis not present

## 2017-06-18 DIAGNOSIS — Z1389 Encounter for screening for other disorder: Secondary | ICD-10-CM | POA: Diagnosis not present

## 2017-06-18 DIAGNOSIS — E119 Type 2 diabetes mellitus without complications: Secondary | ICD-10-CM | POA: Diagnosis not present

## 2017-06-18 DIAGNOSIS — E039 Hypothyroidism, unspecified: Secondary | ICD-10-CM | POA: Diagnosis not present

## 2017-06-18 DIAGNOSIS — Z1331 Encounter for screening for depression: Secondary | ICD-10-CM | POA: Diagnosis not present

## 2017-06-18 DIAGNOSIS — Z5111 Encounter for antineoplastic chemotherapy: Secondary | ICD-10-CM | POA: Diagnosis not present

## 2017-06-18 DIAGNOSIS — G620 Drug-induced polyneuropathy: Secondary | ICD-10-CM | POA: Diagnosis not present

## 2017-06-18 DIAGNOSIS — C7951 Secondary malignant neoplasm of bone: Secondary | ICD-10-CM

## 2017-06-18 DIAGNOSIS — C61 Malignant neoplasm of prostate: Secondary | ICD-10-CM | POA: Diagnosis not present

## 2017-06-18 DIAGNOSIS — E782 Mixed hyperlipidemia: Secondary | ICD-10-CM | POA: Diagnosis not present

## 2017-06-18 NOTE — Assessment & Plan Note (Signed)
1.  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 - BRCA 1/2 negative, PDL 1 expression of 0% -Foundation 1 testing shows MS-stable, TMB intermediate, no other significant alterations to modify therapy. -Most recent progression on cabazitaxel.  He has used [place] in the past.  He had received 2 cycles of docetaxel in the beginning.  It is unclear whether this was discontinued secondary to progression of disease.  Functionally he is doing very well.  As his neuropathy is stable, I have recommended bringing back docetaxel.  I will start with a 20% dose reduction.  We talked about the side effects in detail.  He is agreeable to this plan.  Questions were encouraged and answered to satisfaction.  We will give it with Neulasta support.  We plan to start it next week.  2.  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  This is from prior chemotherapy.  He takes gabapentin 1 tablet in the morning and 2 tablets at bedtime.

## 2017-06-18 NOTE — Patient Instructions (Signed)
[place]   CHEMOTHERAPY INSTRUCTIONS  You will receive premeds prior to receiving chemotherapy.  These premeds include: Dexamethasone - steroid - given to reduce the risk of you having an allergic type reaction to the chemotherapy. Dex can cause you to feel energized, nervous/anxious/jittery, make you have trouble sleeping, and/or make you feel hot/flushed in the face/neck and/or look pink/red in the face/neck. These side effects will pass as the Dex wears off. (takes 20 minutes to infuse)   You will also get Udencya (neulasta) after you finish chemotherapy every 3 weeks.  Margarette [place] - this medication is not chemo but being given because you have had chemo. It is usually given 24-27 hours after the completion of chemotherapy. This medication works by boosting your bone marrow's supply of white blood cells. White blood cells are what protect our bodies against infection. The medication is given in the form of a subcutaneous injection. It is given in the fatty tissue of your abdomen. It is a short needle. The major side effect of this medication is bone or muscle pain. The drug of choice to relieve or lessen the pain is Aleve or Ibuprofen. If a physician has ever told you not to take Aleve or Ibuprofen - then don't take it. You should then take Tylenol/acetaminophen. Take either medication as the bottle directs you to.  The level of pain you experience as a result of this injection can range from none, to mild or moderate, or severe. Please let us know if you develop moderate or severe bone pain. You can also take Claritin 10 mg over the counter for a few days after chemo to help with the bone pain.    POTENTIAL SIDE EFFECTS OF TREATMENT: Docetaxel (Generic Name) Other Names: Taxotere  About This Drug Docetaxel is used to treat cancer. This drug is given in the vein (IV).  This drug will take 1 hour to infuse.   Possible Side Effects (More Common) . Bone marrow  depression. This is a decrease in the number of white blood cells, red blood cells, and platelets. This may raise your risk of infection, make you tired and weak (fatigue), and raise your risk of bleeding. . Swelling of your legs, ankles, and/or feet. Steroids are often given to lessen this swelling. . Hair loss: Hair loss is often complete scalp hair loss and can involve loss of eyebrows, eyelashes, and pubic hair. You may notice this a few days or weeks after treatment has started. There have been cases of permanent hair loss reported. . Loose bowel movements (diarrhea) that may last for a few days. . Nausea and throwing up (vomiting). These symptoms may happen within a few hours after your treatment and may last up to 24 hours. Medicines are available to stop or lessen these side effects. . Soreness of the mouth and throat. You may have red areas, white patches, or sores that hurt. . Effects on the nerves are called peripheral neuropathy. You may feel numbness, tingling, or pain in your hands and feet. It may be hard for you to button your clothes, open jars, or walk as usual. The effect on the nerves may get worse with more doses of the drug. These effects get better in some people after the drug is stopped but it does not get better in all people. . Skin and nail changes. You may develop a rash or have skin redness and swelling followed by peeling. Nail problems may occur such as  changes in nail color, nails becoming thin or brittle, or loss of the nail. Steroids are often given to lessen these side effects. . Weakness that interferes with your daily activities. . This drug contains alcohol and may affect your central nervous system. The central nervous system is made up of your brain and spinal cord. You may feel drunk during and after your treatment and it can impair your ability to drive or use machinery for one to two hours after infusion.  Possible Side Effects (Less Common) . Skin and tissue  irritation may involve redness, pain, warmth, or swelling at the IV site. This happens if the drug leaks out of the vein and into nearby tissue. . Changes in your liver function. Your doctor will check your liver function as needed. . Muscle and joint pain. . Effects on the heart: This drug can weaken the heart and lower heart function. Your heart function will be checked as needed. You may have trouble catching your breath, mainly during activities. You may also have trouble breathing while lying down, and have swelling in your ankles. . This drug may cause an increased risk of developing a second cancer. . Skin and tissue irritation may involve redness, pain, warmth, or swelling at the IV site. This happens if the drug leaks out of the vein and into nearby tissue. Marland Kitchen Blurred vision or other changes in eyesight. . A rare risk of death is increased in people with liver problems. Do not take this drug if you have liver disease and talk to your doctor.  Allergic Reactions Serious allergic reactions, including anaphylaxis are rare. While you are getting this drug in your vein (IV), tell your nurse right away if you have any of these symptoms of an allergic reaction: . Trouble catching your breath . Feeling like your tongue or throat are swelling . Feeling your heart beat quickly or in a not normal way (palpitations) . Feeling dizzy or lightheaded . Flushing, itching, rash, and/or hives  Infusion Reactions While you are getting this drug in your vein (IV), you may have a reaction. Your nurse will check you closely for these signs: fever or shaking chills, flushing, facial swelling, feeling dizzy, headache, trouble breathing, rash, itching, chest tightness, or chest pain. Less serious reactions to this drug may also happen. You will be given medicines to help stop or lessen these symptoms. Your vital signs will be checked during the infusion. Tell your doctor or nurse right away if you have any of  these symptoms at any time during the infusion and/or for the first 24 hours after getting this drug: . Fever, chills, or shaking chills . Feeling dizzy or lightheaded . Headache . Nausea or throwing up  Treating Side Effects . Drink 6-8 cups of fluids every day unless your doctor has told you to limit your fluid intake due to some other health problem. A cup is 8 ounces of fluid. If you throw up or have loose bowel movements, you should drink more fluids so that you do not become dehydrated (lack water in the body due to losing too much fluid). . Talk with your nurse about getting a wig before you lose your hair. Also, call the [place] at [phone]-[phone] to find out information about the " Look Good,Feel Better" program close to where you live. It is a free program where women undergoing chemotherapy can learn about wigs, turbans and scarves as well as makeup techniques and skin and nail care. . Do  not put anything on a rash unless your doctor or nurse says you may. Keep the area around the rash clean and dry. Ask your doctor for medicine if your rash bothers you. . Mouth care is very important. Your mouth care should consist of routine, gentle cleaning of your teeth or dentures and rinsing your mouth with a mixture of 1/2 teaspoon of salt in 8 ounces of water or  teaspoon of baking soda in 8 ounces of water. This should be done at least after each meal and at bedtime. . If you have mouth sores, avoid mouthwash that has alcohol. Avoid alcohol and smoking because they can bother your mouth and throat. . Ask your doctor or nurse about medicine to stop or lessen loose bowel movements, nausea, throwing up, and joint and muscle pain. . If you have numbness and tingling in your hands and feet, be careful when cooking, walking, and handling sharp objects and hot liquids.  Food and Drug Interactions There are no known interactions of docetaxel with food. This drug may interact with other  medicines. Tell your doctor and pharmacist about all the medicines and dietary supplements (vitamins, minerals, herbs and others) that you are taking at this time. The safety and use of dietary supplements and alternative diets are often not known. Using these might affect your cancer or interfere with your treatment. Until more is known, you should not use dietary supplements or alternative diets without your cancer doctor's help.  When to Call the Doctor Call your doctor or nurse right away if you have any of these symptoms: . Fever of 100.5 F (38 C) or above . Chills . Easy bruising or bleeding . Wheezing or trouble breathing . Rash or itching . Feeling dizzy or lightheaded . Loose bowel movements (diarrhea) more than 4 times a day or diarrhea with weakness or feeling lightheaded . Nausea that stops you from eating or drinking . Throwing up more than 3 times a day . Signs of liver problems: dark urine, pale bowel movements, bad stomach pain, feeling very tired and weak, unusual itching, or yellowing of the eyes or skin . Eye irritation, blurred vision or other changes in eyesight Call your doctor or nurse as soon as possible if any of these symptoms happen: . Decreased urine . Pain in your mouth or throat that makes it hard to eat or drink . Nausea that is not relieved by prescribed medicines . Rash that is not relieved by prescribed medicines . Numbness, tingling, decreased feeling or weakness in fingers, toes, arms, or legs . Trouble walking or changes in the way you walk, feeling clumsy when buttoning clothes, opening jars, or other routine hand motions . Swelling of legs, ankles, or feet . Weight gain of 5 pounds in one week (fluid retention) . Fatigue that interferes with your daily activities . Headache that does not go away . Extreme weakness that interferes with normal activities . While you are getting this drug, please tell your nurse right away if you have any pain, redness,  or swelling at the site of the IV infusion . Symptoms of being drunk, confusion, or being very sleepy  Sexual Problems and Reproductive Concerns . Pregnancy warning: This drug may have harmful effects on the unborn child, so effective methods of birth control should be used during your cancer treatment. Genetic counseling is available for you to talk about the effects of this drug therapy on future pregnancies. Also, a Dietitian can look at the possible  risk of problems in the unborn baby due to this medicine if an exposure happens during pregnancy. . Breast feeding warning: It is not known if this drug passes into breast milk. For this reason, women should talk to their doctor about the risks and benefits of breast feeding during treatment with this drug because this drug may enter the breast milk and badly harm a breast feeding baby.    SELF CARE ACTIVITIES WHILE ON CHEMOTHERAPY: Hydration Increase your fluid intake 48 hours prior to treatment and drink at least 8 to 12 cups (64 ounces) of water/decaff beverages per day after treatment. You can still have your cup of coffee or soda but these beverages do not count as part of your 8 to 12 cups that you need to drink daily. No alcohol intake.  Medications Continue taking your normal prescription medication as prescribed.  If you start any new herbal or new supplements please let us know first to make sure it is safe.  Mouth Care Have teeth cleaned professionally before starting treatment. Keep dentures and partial plates clean. Use soft toothbrush and do not use mouthwashes that contain alcohol. Biotene is a good mouthwash that is available at most pharmacies or may be ordered by calling [phone]. Use warm salt water gargles (1 teaspoon salt per 1 quart warm water) before and after meals and at bedtime. Or you may rinse with 2 tablespoons of three-percent hydrogen peroxide mixed in eight ounces of water. If you are still having  problems with your mouth or sores in your mouth please call the clinic. If you need dental work, please let the doctor know before you go for your appointment so that we can coordinate the best possible time for you in regards to your chemo regimen. You need to also let your dentist know that you are actively taking chemo. We may need to do labs prior to your dental appointment.  Skin Care Always use sunscreen that has not expired and with SPF (Sun Protection Factor) of 50 or higher. Wear hats to protect your head from the sun. Remember to use sunscreen on your hands, ears, face, & feet.  Use good moisturizing lotions such as udder cream, eucerin, or even Vaseline. Some chemotherapies can cause dry skin, color changes in your skin and nails.    . Avoid long, hot showers or baths. . Use gentle, fragrance-free soaps and laundry detergent. . Use moisturizers, preferably creams or ointments rather than lotions because the thicker consistency is better at preventing skin dehydration. Apply the cream or ointment within 15 minutes of showering. Reapply moisturizer at night, and moisturize your hands every time after you wash them.  Hair Loss (if your doctor says your hair will fall out)  . If your doctor says that your hair is likely to fall out, decide before you begin chemo whether you want to wear a wig. You may want to shop before treatment to match your hair color. . Hats, turbans, and scarves can also camouflage hair loss, although some people prefer to leave their heads uncovered. If you go bare-headed outdoors, be sure to use sunscreen on your scalp. . Cut your hair short. It eases the inconvenience of shedding lots of hair, but it also can reduce the emotional impact of watching your hair fall out. . Don't perm or color your hair during chemotherapy. Those chemical treatments are already damaging to hair and can enhance hair loss. Once your chemo treatments are done and your hair has grown  back,  it's OK to resume dyeing or perming hair. With chemotherapy, hair loss is almost always temporary. But when it grows back, it may be a different color or texture. In older adults who still had hair color before chemotherapy, the new growth may be completely gray.  Often, new hair is very fine and soft.  Infection Prevention Please wash your hands for at least 30 seconds using warm soapy water. Handwashing is the #1 way to prevent the spread of germs. Stay away from sick people or people who are getting over a cold. If you develop respiratory systems such as green/yellow mucus production or productive cough or persistent cough let us know and we will see if you need an antibiotic. It is a good idea to keep a pair of gloves on when going into grocery stores/[employer] to decrease your risk of coming into contact with germs on the carts, etc. Carry alcohol hand gel with you at all times and use it frequently if out in public. If your temperature reaches 100.5 or higher please call the clinic and let us know.  If it is after hours or on the weekend please go to the ER if your temperature is over 100.5.  Please have your own personal thermometer at home to use.     Sex and bodily fluids If you are going to have sex, a condom must be used to protect the person that isn't taking chemotherapy. Chemo can decrease your libido (sex drive). For a few days after chemotherapy, chemotherapy can be excreted through your bodily fluids.  When using the toilet please close the lid and flush the toilet twice.  Do this for a few day after you have had chemotherapy.   Effects of chemotherapy on your sex life Some changes are simple and won't last long. They won't affect your sex life permanently. Sometimes you may feel: . too tired . not strong enough to be very active . sick or sore  . not in the mood . anxious or low Your anxiety might not seem related to sex. For example, you may be worried about the cancer and how your  treatment is going. Or you may be worried about money, or about how you family are coping with your illness. These things can cause stress, which can affect your interest in sex. It's important to talk to your partner about how you feel. Remember - the changes to your sex life don't usually last long. There's usually no medical reason to stop having sex during chemo. The drugs won't have any long term physical effects on your performance or enjoyment of sex. Cancer can't be passed on to your partner during sex  Contraception It's important to use reliable contraception during treatment. Avoid getting pregnant while you or your partner are having chemotherapy. This is because the drugs may harm the baby. Sometimes chemotherapy drugs can leave a man or woman infertile.  This means you would not be able to have children in the future. You might want to talk to someone about permanent infertility. It can be very difficult to learn that you may no longer be able to have children. Some people find counselling helpful. There might be ways to preserve your fertility, although this is easier for men than for women. You may want to speak to a fertility expert. You can talk about sperm banking or harvesting your eggs. You can also ask about other fertility options, such as donor eggs. If you have or have  had breast cancer, your doctor might advise you not to take the contraceptive pill. This is because the hormones in it might affect the cancer.  It is not known for sure whether or not chemotherapy drugs can be passed on through semen or secretions from the vagina. Because of this some doctors advise people to use a barrier method if you have sex during treatment. This applies to vaginal, anal or oral sex. Generally, doctors advise a barrier method only for the time you are actually having the treatment and for about a week after your treatment. Advice like this can be worrying, but this does not mean that you have  to avoid being intimate with your partner. You can still have close contact with your partner and continue to enjoy sex.  Animals If you have cats or birds we just ask that you not change the litter or change the cage.  Please have someone else do this for you while you are on chemotherapy.   Food Safety During and After Cancer Treatment Food safety is important for people both during and after cancer treatment. Cancer and cancer treatments, such as chemotherapy, radiation therapy, and stem cell/bone marrow transplantation, often weaken the immune system. This makes it harder for your body to protect itself from foodborne illness, also called food poisoning. Foodborne illness is caused by eating food that contains harmful bacteria, parasites, or viruses.  Foods to avoid Some foods have a higher risk of becoming tainted with bacteria. These include: Marland Kitchen Unwashed fresh fruit and vegetables, especially leafy vegetables that can hide dirt and other contaminants . Raw sprouts, such as alfalfa sprouts . Raw or undercooked beef, especially ground beef, or other raw or undercooked meat and poultry . Fatty, fried, or spicy foods immediately before or after treatment.  These can sit heavy on your stomach and make you feel nauseous. . Raw or undercooked shellfish, such as oysters. . Sushi and sashimi, which often contain raw fish.  . Unpasteurized beverages, such as unpasteurized fruit juices, raw milk, raw yogurt, or cider . Undercooked eggs, such as soft boiled, over easy, and poached; raw, unpasteurized eggs; or foods made with raw egg, such as homemade raw cookie dough and homemade mayonnaise Simple steps for food safety Shop smart. . Do not buy food stored or displayed in an unclean area. . Do not buy bruised or damaged fruits or vegetables. . Do not buy cans that have cracks, dents, or bulges. . Pick up foods that can spoil at the end of your shopping trip and store them in a cooler on the way  home. Prepare and clean up foods carefully. . Rinse all fresh fruits and vegetables under running water, and dry them with a clean towel or paper towel. . Clean the top of cans before opening them. . After preparing food, wash your hands for 20 seconds with hot water and soap. Pay special attention to areas between fingers and under nails. . Clean your utensils and dishes with hot water and soap. Marland Kitchen Disinfect your kitchen and cutting boards using 1 teaspoon of liquid, unscented bleach mixed into 1 quart of water.   Dispose of old food. . Eat canned and packaged food before its expiration date (the "use by" or "best before" date). . Consume refrigerated leftovers within 3 to 4 days. After that time, throw out the food. Even if the food does not smell or look spoiled, it still may be unsafe. Some bacteria, such as Listeria, can grow even on  foods stored in the refrigerator if they are kept for too long. Take precautions when eating out. . At restaurants, avoid buffets and salad bars where food sits out for a long time and comes in contact with many people. Food can become contaminated when someone with a virus, often a norovirus, or another "bug" handles it. . Put any leftover food in a "to-go" container yourself, rather than having the server do it. And, refrigerate leftovers as soon as you get home. . Choose restaurants that are clean and that are willing to prepare your food as you order it cooked.   MEDICATIONS: Zofran/Ondansetron 8mg  tablet. Take 1 tablet every 8 hours as needed for nausea/vomiting. (#1 nausea med to take, this can constipate)  Compazine/Prochlorperazine 10mg  tablet. Take 1 tablet every 6 hours as needed for nausea/vomiting. (#2 nausea med to take, this can make you sleepy)  Over-the-Counter Meds: Miralax 1 capful in 8 oz of fluid daily. May increase to two times a day if needed. This is a stool softener. If this doesn't work proceed you can add:  Senokot S-start with 1  tablet two times a day and increase to 4 tablets two times a day if needed. (total of 8 tablets in a 24 hour period). This is a stimulant laxative.   Call us if this does not help your bowels move.   Imodium 2mg  capsule. Take 2 capsules after the 1st loose stool and then 1 capsule every 2 hours until you go a total of 12 hours without having a loose stool. Call the [facility] if loose stools continue. If diarrhea occurs @ bedtime, take 2 capsules @ bedtime. Then take 2 capsules every 4 hours until morning. Call [facility].    Constipation Sheet *Miralax in 8 oz of fluid daily.  May increase to two times a day if needed.  This is a stool softener.  If this not enough to keep your bowel regular:  You can add:  *Senokot S, start with one tablet twice a day and can increase to 4 tablets twice a day if needed.  This is a stimulant laxative.   Sometimes when you take pain medication, you need BOTH a medicine to keep your stool soft and a medicine to help your bowel push it out!  Please call if the above does not work for you.   Do not go more than 2 days without a bowel movement.  It is very important that you do not become constipated.  It will make you feel sick to your stomach (nausea) and can cause abdominal pain and vomiting.     Diarrhea Sheet  If you are having loose stools/diarrhea, please purchase Imodium and begin taking as outlined:  At the first sign of poorly formed or loose stools you should begin taking Imodium(loperamide) 2 mg capsules.  Take two caplets (4mg ) followed by one caplet (2mg ) every 2 hours until you have had no diarrhea for 12 hours.  During the night take two caplets (4mg ) at bedtime and continue every 4 hours during the night until the morning.  Stop taking Imodium only after there is no sign of diarrhea for 12 hours.    Always call the [facility] if you are having loose stools/diarrhea that you can't get under control.  Loose stools/diarrhea leads to  dehydration (loss of water) in your body.  We have other options of trying to get the loose stools/diarrhea to stopped but you must let us know!    Nausea  Sheet  Zofran/Ondansetron 8mg  tablet. Take 1 tablet every 8 hours as needed for nausea/vomiting. (#1 nausea med to take, this can constipate)  Compazine/Prochlorperazine 10mg  tablet. Take 1 tablet every 6 hours as needed for nausea/vomiting. (#2 nausea med to take, this can make you sleepy)  You can take these medications together or separately.  We would first like for you to try the Ondansetron by itself and then take the Prochloperizine if needed. But you are allowed to take both medications at the same time if your nausea is that severe.  If you are having persistent nausea (nausea that does not stop) please take these medications on a staggered schedule so that the nausea medication stays in your body.  Please call the [facility] and let us know the amount of nausea that you are experiencing.  If you begin to vomit, you need to call the [facility] and if it is the weekend and you have vomited more than one time and cant get it to stop-go to the Emergency Room.  Persistent nausea/vomiting can lead to dehydration (loss of fluid in your body) and will make you feel terrible.   Ice chips, sips of clear liquids, foods that are @ room temperature, crackers, and toast tend to be better tolerated.    SYMPTOMS TO REPORT AS SOON AS POSSIBLE AFTER TREATMENT:  FEVER GREATER THAN 100.5 F  CHILLS WITH OR WITHOUT FEVER  NAUSEA AND VOMITING THAT IS NOT CONTROLLED WITH YOUR NAUSEA MEDICATION  UNUSUAL SHORTNESS OF BREATH  UNUSUAL BRUISING OR BLEEDING  TENDERNESS IN MOUTH AND THROAT WITH OR WITHOUT PRESENCE OF ULCERS  URINARY PROBLEMS  BOWEL PROBLEMS  UNUSUAL RASH    Wear comfortable clothing and clothing appropriate for easy access to any Portacath or PICC line. Let us know if there is anything that we can do to make your therapy  better!     What to do if you need assistance after hours or on the weekends: CALL [phone].  HOLD on the line, do not hang up.  You will hear multiple messages but at the end you will be connected with a nurse triage line.  They will contact the doctor if necessary.  Most of the time they will be able to assist you.  Do not call the hospital operator.     I have been informed and understand all of the instructions given to me and have received a copy. I have been instructed to call the clinic [phone]  or my family physician as soon as possible for continued medical care, if indicated. I do not have any more questions at this time but understand that I may call the [facility] at [phone] during office hours should I have questions or need assistance in obtaining follow-up care.

## 2017-06-18 NOTE — Progress Notes (Signed)
[place] [address] [place], [place] [address]   CLINIC:  Medical Oncology/Hematology  PCP:  [name], MD [place] [id number] [phone]   REASON FOR VISIT:  Follow-up for metastatic prostate cancer.  CURRENT THERAPY: Docetaxel to be started.  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)   10/01/2012 Initial Diagnosis    Prostate biopsied with highest Gleason score of 9 seen and the lowest score was 7.      10/04/2012 - 05/16/2013 Chemotherapy    Depo-Lupron and Casodex initiated      05/16/2013 -  Chemotherapy    Depo-Lupron monthly continued      05/16/2013 Progression    Progression by PSA elevation      05/16/2013 - 10/22/2014 Chemotherapy    Abiraterone and prednisone initiated in conjunction with ongoing Depo-Lupron.  Denosumab also ongoing.      10/23/2014 Progression    PSA increasing from 0.2- 1.6 in less than 6 months.        10/23/2014 - 01/30/2015 Chemotherapy    Enzalutamide and Prednisone (5 mg in AM and 2.5 mg in PM)      01/30/2015 Imaging    Bone scan- New focus of intense activity in right proximal humerus.  Interim increase in activity over left hip.      01/30/2015 Progression    Bone scan reveals new disease in right humerus consistent with progression of disease      01/31/2015 Imaging    Right humerus xray- blastic foci in proximal right humeral metaphysis and over right mid-humeral diaphysis.  No evidence of fracture      07/06/2015 Progression    Progression in multiple bones especially L hip and femurs      07/06/2015 Imaging    Bone scan- progressive multifocal osseous metastases in the right proximal femora and distal femoral shafts.  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 Tammi Klippel      11/23/2015 - 01/04/2016 Chemotherapy    The patient had  pegfilgrastim (NEULASTA ONPRO KIT) injection 6 mg, 6 mg, Subcutaneous, Once, 3 of 7 cycles  DOCEtaxel (TAXOTERE) 180 mg in dextrose 5 % 250 mL chemo infusion, 75 mg/m2 = 180 mg, Intravenous,  Once, 3 of 7 cycles Dose modification: 64 mg/m2 (original dose 75 mg/m2, Cycle 2, Reason: Dose not tolerated)  pegfilgrastim (NEULASTA ONPRO KIT) injection 6 mg, 6 mg, Subcutaneous, Once, 0 of 4 cycles  cabazitaxel (JEVTANA) 60 mg in dextrose 5 % 250 mL chemo infusion, 25 mg/m2, Intravenous,  Once, 0 of 4 cycles  for chemotherapy treatment.        11/30/2015 Adverse Reaction    Diarrhea (secondary to chemotherapy) and dehydration requiring IV fluids      12/14/2015 Treatment Plan Change    Docetaxel dose reduced by 15%      12/31/2015 Procedure    Port placed by Dr. Arnoldo Morale      01/28/2016 -  Chemotherapy    Cabazitaxel (Jevtana)       03/27/2016 Imaging    CT Chest, Abdomen, and Pelvis with contrast 1. Diffuse sclerotic osseous metastatic disease in the chest, abdomen, and pelvis without acute fracture identified. 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 These appear chronic. 2. The prostate gland is normal in size and  no adenopathy is currently identified. 3. On a prior MRI of 10/04/2012, there was a posterior right hepatic lobe lesion. This lesion is not currently visible on today' s CT. This could be due to differences in cons acuity between CT or MRI, or resolution of the lesion. 4. Coronary, aortic arch, and branch vessel atherosclerotic vascular disease. Aortoiliac atherosclerotic vascular disease. 5. Single bilateral renal cysts.      04/16/2016 Imaging    Bone scan- Findings consist with progressive metastatic disease. Activity over the proximal right humerus and proximal bilateral femurs are worrisome for the possible development of pathologic fractures.      08/21/2016 Imaging    CT C/A/P: IMPRESSION: No significant change since  03/27/2016 CT. Diffuse bony metastases without other evidence of metastatic disease.      08/21/2016 Imaging    Bone Scan: IMPRESSION: Multiple areas of increased activity again noted throughout the axial and appendicular skeleton in similar locations as prior exam. Intensity of uptake is increased from prior exam suggesting progressive disease. Lesions present in the proximal humeri, proximal femurs, and the mid right femur susceptible to pathologic fracture.      12/02/2016 Imaging    CT C/A/P: IMPRESSION: 1. Overall stable appearance of diffuse osseous metastatic disease and resulting patchy sclerosis. 2. The patient had a posterior right hepatic lobe lesion on prior MRI from 2014 which is been relatively occult on CT. Given the lack of progression I suspect that this is benign or has been effectively treated. 3. Aortic Atherosclerosis (ICD10-I70.0). Coronary atherosclerosis with mild cardiomegaly. 4. Cholelithiasis. 5. Bilateral benign renal cysts. 6. 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      12/02/2016 Imaging    Bone Scan: IMPRESSION: 1. Widespread osseous metastatic disease. Multiplicity is similar to previous exam with interval increase in degree of tracer uptake associated with multiple lesions.      03/11/2017 Imaging    Bone Scan: Multifocal skeletal disease without sign of progression CT C/A/P: Diffuse sclerotic skeletal lesions, unchanged from the previous.  No new lymphadenopathy, pulmonary nodules, or hepatic lesions to suggest soft tissue progressive disease      06/18/2017 -  Chemotherapy    The patient had pegfilgrastim-cbqv (UDENYCA) injection 6 mg, 6 mg, Subcutaneous, Once, 0 of 4 cycles DOCEtaxel (TAXOTERE) 170 mg in dextrose 5 % 250 mL chemo infusion, 75 mg/m2, Intravenous,   Once, 0 of 4 cycles  for chemotherapy treatment.         CANCER STAGING: Cancer Staging Prostate cancer metastatic to bone [facility]) Staging form: Prostate, AJCC 7th Edition - Clinical: No stage assigned - Unsigned    INTERVAL HISTORY:  Mr. Arment 72 y.o. male returns for follow-up of foundation 1 CDX results.  He is accompanied by his daughter.  His neuropathy in the legs has been stable.  He takes 1 gabapentin in the morning and 2 at nighttime.  He denies any burning sensation.  He denies any new onset pains.  Functionally he is doing well.  His appetite has been good.  Denies any nausea vomiting or diarrhea.   REVIEW OF SYSTEMS:  Review of Systems  Constitutional: Positive for fatigue.  Neurological: Positive for numbness.  All other systems reviewed and are negative.    PAST MEDICAL/SURGICAL HISTORY:  Past Medical History:  Diagnosis Date  . Diabetes mellitus without complication ([facility])   .  Hypertension   . Prostate cancer ([facility]) 09/05/2015  . Prostate cancer metastatic to bone ([facility]) 09/05/2015  . Sleep apnea   . Thyroid disease    Past Surgical History:  Procedure Laterality Date  . CATARACT EXTRACTION    . PORTACATH PLACEMENT Left 12/31/2015   Procedure: INSERTION PORT-A-CATH LEFT SUBCLAVIAN;  Surgeon: Aviva Signs, MD;  Location: [facility];  Service: General;  Laterality: Left;  . REPLACEMENT TOTAL KNEE Left      SOCIAL HISTORY:  Social History   Socioeconomic History  . Marital status: Married    Spouse name: Not on file  . Number of children: Not on file  . Years of education: Not on file  . Highest education level: Not on file  Occupational History  . Not on file  Social Needs  . Financial resource strain: Not on file  . Food insecurity:    Worry: Not on file    Inability: Not on file  . Transportation needs:    Medical: Not on file    Non-medical: Not on file  Tobacco Use  . Smoking status: Never Smoker  . Smokeless tobacco: Never Used  Substance and  Sexual Activity  . Alcohol use: Yes    Comment: 1 beer each month  . Drug use: No  . Sexual activity: Never    Comment: married  Lifestyle  . Physical activity:    Days per week: Not on file    Minutes per session: Not on file  . Stress: Not on file  Relationships  . Social connections:    Talks on phone: Not on file    Gets together: Not on file    Attends religious service: Not on file    Active member of club or organization: Not on file    Attends meetings of clubs or organizations: Not on file    Relationship status: Not on file  . Intimate partner violence:    Fear of current or ex partner: Not on file    Emotionally abused: Not on file    Physically abused: Not on file    Forced sexual activity: Not on file  Other Topics Concern  . Not on file  Social History Narrative  . Not on file    FAMILY HISTORY:  History reviewed. No pertinent family history.  CURRENT MEDICATIONS:  Outpatient Encounter Medications as of 06/18/2017  Medication Sig Note  . atenolol (TENORMIN) 100 MG tablet Take 100 mg by mouth daily.   Marland Kitchen atorvastatin (LIPITOR) 20 MG tablet Take 10 mg by mouth daily.   . calcium carbonate (OS-CAL - DOSED IN MG OF ELEMENTAL CALCIUM) 1250 (500 Ca) MG tablet Take 1 tablet by mouth 2 (two) times daily.    Marland Kitchen diltiazem (CARDIZEM CD) 300 MG 24 hr capsule Take 300 mg by mouth daily.   . DOCEtaxel (TAXOTERE IV) Inject into the vein.   Marland Kitchen gabapentin (NEURONTIN) 300 MG capsule Take 1 capsule (300 mg total) by mouth 3 (three) times daily. (Patient taking differently: Take 300-600 mg by mouth See admin instructions. Take 300 mg by mouth in the morning and take 600 mg by mouth at bedtime)   . Glucosamine-Chondroit-Vit C-Mn (GLUCOSAMINE 1500 COMPLEX PO) Take 1 tablet by mouth 2 (two) times daily.    Marland Kitchen KLOR-CON M20 20 MEQ tablet TAKE 1 TABLET BY MOUTH ONCE DAILY (Patient taking differently: TAKE 20 MEQ BY MOUTH ONCE DAILY)   . leuprolide (LUPRON) 7.5 MG injection Inject 7.5 mg  into the muscle every  28 (twenty-eight) days.   Marland Kitchen levothyroxine (SYNTHROID, LEVOTHROID) 25 MCG tablet Take 25 mcg by mouth daily before breakfast.   . loperamide (IMODIUM) 1 MG/5ML solution Take 1 mg by mouth as needed for diarrhea or loose stools.   . Lysine 500 MG CAPS Take 500 mg by mouth daily.   . magic mouthwash w/lidocaine SOLN 1 part of each of the following: Benadryl 12.40m /526m Viscous lidocaine 2%, Maalox. Swish and swallow 5 mL QID. (Patient taking differently: Take 5 mLs by mouth 4 (four) times daily as needed for mouth pain. Benadryl 12.51m90m51ml34miscous lidocaine 2%, Maalox.)   . metFORMIN (GLUCOPHAGE) 500 MG tablet Take 500 mg by mouth 2 (two) times daily with a meal.   . morphine (MS CONTIN) 60 MG 12 hr tablet Take 1 tablet (60 mg total) by mouth every 12 (twelve) hours.   . moMarland Kitchenphine (MSIR) 30 MG tablet Take 1 tablet (30 mg total) by mouth every 4 (four) hours as needed for severe pain.   . Multiple Vitamin (MULTIVITAMIN WITH MINERALS) TABS tablet Take 1 tablet by mouth daily.   . Omega-3 Fatty Acids (FISH OIL PO) Take 1 capsule by mouth daily.   . ondansetron (ZOFRAN-ODT) 8 MG disintegrating tablet Take 1 tablet (8 mg total) by mouth every 8 (eight) hours as needed for nausea or vomiting.   . pegfilgrastim-cbqv (UDENYCA) 6 MG/0.6ML injection Inject 6 mg into the skin once.   . predniSONE (DELTASONE) 5 MG tablet Take 5 mg by mouth 2 (two) times daily with a meal.   . tamsulosin (FLOMAX) 0.4 MG CAPS capsule Take 0.8 mg by mouth daily.    . trMarland Kitchenamterene-hydrochlorothiazide (MAXZIDE-25) 37.5-25 MG tablet Take 1 tablet by mouth daily. for high blood pressure   . [DISCONTINUED] ondansetron (ZOFRAN) 8 MG tablet Take 1 tablet (8 mg total) by mouth 2 (two) times daily as needed (Nausea or vomiting). (Patient taking differently: Take 8 mg by mouth 2 (two) times daily as needed for nausea or vomiting. )   . [DISCONTINUED] prochlorperazine (COMPAZINE) 10 MG tablet Take 1 tablet (10 mg total) by  mouth every 6 (six) hours as needed (Nausea or vomiting). (Patient taking differently: Take 10 mg by mouth every 6 (six) hours as needed for nausea or vomiting. )   . denosumab (XGEVA) 120 MG/1.7ML SOLN injection Inject 120 mg into the skin every 28 (twenty-eight) days. 05/18/2017: On hold per MD   Facility-Administered Encounter Medications as of 06/18/2017  Medication  . leuprolide (LUPRON) injection 7.5 mg    ALLERGIES:  No Known Allergies   PHYSICAL EXAM:  ECOG Performance status: 1  Vitals:   06/18/17 0916  BP: (!) 143/68  Pulse: (!) 58  Resp: 18  Temp: 99 F (37.2 C)  SpO2: 97%   Filed Weights   06/18/17 0916  Weight: 222 lb (100.7 kg)    Physical Exam  Constitutional: He is oriented to person, place, and time. He appears well-developed and well-nourished.  Neurological: He is alert and oriented to person, place, and time.  Psychiatric: He has a normal mood and affect. His behavior is normal. Judgment and thought content normal.     LABORATORY DATA:  I have reviewed the labs as listed.  CBC    Component Value Date/Time   WBC 6.2 05/22/2017 1138   RBC 3.53 (L) 05/22/2017 1138   HGB 10.3 (L) 05/22/2017 1138   HCT 32.5 (L) 05/22/2017 1138   PLT 197 05/22/2017 1138   MCV 92.1 05/22/2017 1138  MCH 29.2 05/22/2017 1138   MCHC 31.7 05/22/2017 1138   RDW 16.0 (H) 05/22/2017 1138   LYMPHSABS 0.6 (L) 05/01/2017 1406   MONOABS 0.6 05/01/2017 1406   EOSABS 0.0 05/01/2017 1406   BASOSABS 0.0 05/01/2017 1406   CMP Latest Ref Rng & Units 05/01/2017 04/29/2017 04/23/2017  Glucose 65 - 99 mg/dL 165(H) 173(H) 150(H)  BUN 6 - 20 mg/dL '11 11 11  ' Creatinine 0.61 - 1.24 mg/dL 0.82 0.74 0.74  Sodium 135 - 145 mmol/L 126(L) 139 141  Potassium 3.5 - 5.1 mmol/L 3.7 4.0 3.5  Chloride 101 - 111 mmol/L 88(L) 102 105  CO2 22 - 32 mmol/L '23 23 24  ' Calcium 8.9 - 10.3 mg/dL 9.0 9.6 8.5(L)  Total Protein 6.5 - 8.1 g/dL - 6.8 6.3(L)  Total Bilirubin 0.3 - 1.2 mg/dL - 0.5 0.7  Alkaline  Phos 38 - 126 U/L - 139(H) 182(H)  AST 15 - 41 U/L - 32 32  ALT 17 - 63 U/L - 18 18       ASSESSMENT & PLAN:   Prostate cancer metastatic to bone ([facility]) 1.  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 - BRCA 1/2 negative, PDL 1 expression of 0% -Foundation 1 testing shows MS-stable, TMB intermediate, no other significant alterations to modify therapy. -Most recent progression on cabazitaxel.  He has used [place] in the past.  He had received 2 cycles of docetaxel in the beginning.  It is unclear whether this was discontinued secondary to progression of disease.  Functionally he is doing very well.  As his neuropathy is stable, I have recommended bringing back docetaxel.  I will start with a 20% dose reduction.  We talked about the side effects in detail.  He is agreeable to this plan.  Questions were encouraged and answered to satisfaction.  We will give it with Neulasta support.  We plan to start it next week.  2.  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  This is from prior chemotherapy.  He takes gabapentin 1 tablet in the morning and 2 tablets at bedtime.  I have reviewed the results of foundation one CDX with the patient and his daughter in detail.  Total time spent is 25 minutes with more than 50% of the time spent face-to-face discussing diagnostic test results, treatment options and side effects.    Orders placed this encounter:  Orders Placed This Encounter  Procedures  . CBC with Differential  . Comprehensive metabolic panel  . PSA  . CBC with Differential  . Comprehensive metabolic panel      Derek Jack, MD [place] [phone]

## 2017-06-19 ENCOUNTER — Ambulatory Visit (HOSPITAL_COMMUNITY): Payer: Medicare Other | Admitting: Physical Therapy

## 2017-06-19 ENCOUNTER — Encounter (HOSPITAL_COMMUNITY): Payer: Self-pay | Admitting: Physical Therapy

## 2017-06-19 DIAGNOSIS — M6281 Muscle weakness (generalized): Secondary | ICD-10-CM | POA: Diagnosis not present

## 2017-06-19 DIAGNOSIS — R2689 Other abnormalities of gait and mobility: Secondary | ICD-10-CM | POA: Diagnosis not present

## 2017-06-19 DIAGNOSIS — Z9181 History of falling: Secondary | ICD-10-CM

## 2017-06-19 NOTE — Therapy (Signed)
[place], [place], [address] Phone: [phone]   Fax:  [phone]  Physical Therapy Treatment  Patient Details  Name: Micheal Davis MRN: [id number] Date of Birth: 11-10-1945 Referring Provider: Creola Corn, MD   Encounter Date: 06/19/2017  PT End of Session - 06/19/17 1202    Visit Number  12    Number of Visits  19    Date for PT Re-Evaluation  06/22/17 minireassessment complete 06/01/2017    Authorization Type  Medicare Part A and B    Authorization Time Period  04/20/17 - 06/01/17; 06/01/17-06/22/17    Authorization - Visit Number  6    Authorization - Number of Visits  10    PT Start Time  1118    PT Stop Time  1157    PT Time Calculation (min)  39 min    Equipment Utilized During Treatment  Gait belt    Activity Tolerance  Patient tolerated treatment well fatigue level increased from 3/10 to 5/10 at EOS    Behavior During Therapy  [facility] for tasks assessed/performed       Past Medical History:  Diagnosis Date  . Diabetes mellitus without complication ([place])   . Hypertension   . Prostate cancer ([place]) 09/05/2015  . Prostate cancer metastatic to bone ([place]) 09/05/2015  . Sleep apnea   . Thyroid disease     Past Surgical History:  Procedure Laterality Date  . CATARACT EXTRACTION    . PORTACATH PLACEMENT Left 12/31/2015   Procedure: INSERTION PORT-A-CATH LEFT SUBCLAVIAN;  Surgeon: Aviva Signs, MD;  Location: [facility];  Service: General;  Laterality: Left;  . REPLACEMENT TOTAL KNEE Left     There were no vitals filed for this visit.  Subjective Assessment - 06/19/17 1121    Subjective  Patient stated that he is having some back and hip pain today. He stated that he has not been doing his exercises at home.     Pertinent History  Stage 4 prostate cancer, Left TKA    Limitations  Walking;House hold activities    Patient Stated Goals  get stronger and have more energy to walk or do activites for longer  amounts of time    Currently in Pain?  Yes    Pain Score  3     Pain Location  -- Back and thighs    Pain Orientation  Right;Left;Lower    Pain Descriptors / Indicators  Aching;Sore    Pain Type  Chronic pain    Pain Onset  More than a month ago                            Balance Exercises - 06/19/17 1126      Balance Exercises: Standing   SLS  Eyes open;2 reps;Solid surface;Intermittent upper extremity support;Other (comment)    Stepping Strategy  Anterior;Posterior;Lateral;Other reps (comment) Stepping over 4 dowel rods in a box pattern 5 trips each     Tandem Gait  Forward;2 reps;Other (comment) 14 feet x 2    Partial Tandem Stance  Eyes open;Foam/compliant surface;Limitations;4 reps;30 secs each lower extremity    Sidestepping  Foam/compliant support;4 reps;Limitations;Theraband;Other (comment) Red theraband 15 feet x 2 round trips (4 reps total)    Step Over Hurdles / Cones  lateral step over  Two 6" hurdle; 1x 10 reps each direction    Other Standing Exercises  Tandem stance on foam with overhead shoulder  flexion with 1# bar weight        PT Education - 06/19/17 1201    Education provided  Yes    Education Details  Educated patient on purpose and technique with exercises throughout. Discussed performing HEP.     Person(s) Educated  Patient    Methods  Explanation;Verbal cues;Tactile cues    Comprehension  Verbalized understanding;Returned demonstration       PT Short Term Goals - 06/01/17 0839      PT SHORT TERM GOAL #1   Title  Patient will be independent with HEP    Baseline  05/18/17 - limited by sickness secondary to cemotherapy    Time  3    Period  Weeks    Status  Achieved      PT SHORT TERM GOAL #2   Title  Patient will improve MMT for limited groups by 1/2 grade to demonstrate improved functional strength for improved performance with stairs and gait.    Baseline  05/18/17 - all 1/2 improved    Time  3    Period  Weeks    Status   Achieved      PT SHORT TERM GOAL #3   Title  Patient will demosntrate improved balance by performing SLS for Bil LE for 10 seconds each to imprvoe performance to ascend/descend stairs with decreased risk of falling.    Baseline  05/18/17 - Rt EL = 5 sec, Lt LE =14 sec    Time  3    Period  Weeks    Status  Partially Met    Target Date  06/22/17        PT Long Term Goals - 06/01/17 0840      PT LONG TERM GOAL #1   Title  Patient will improve MMT for limited groups by 1 grade to demonstrate improved functional strength for improved performance with stairs and gait.    Baseline  05/18/17 - all 1/2 improved    Time  3    Period  Weeks    Status  Partially Met    Target Date  06/22/17      PT LONG TERM GOAL #2   Title  Patient will perform 5x sit to stand in = or <12 seconds to demonstrate improve functional LE strength and to decrease risk of falling.    Baseline  06/01/17 - 16 seconds, no UE    Time  3    Period  Weeks    Status  On-going    Target Date  06/22/17      PT LONG TERM GOAL #3   Title  Patient will score =>20/24 on the DGI to demonstrate significant improvement in balance during gait and decrease risk of falling.    Baseline  06/01/17 - 20/24    Time  3    Period  Weeks    Status  Achieved    Target Date  06/22/17            Plan - 06/19/17 1202    Clinical Impression Statement  This session focused on balance exercises. Patient demonstrated ability to perform stepping over hurdles with only kicking over hurdle on 3 trials. Patient performed sidestepping on foam with intermittent use of upper extremities for balance. Patient performed tandem stance on foam with upper extremity overhead flexion with 1 pound bar weight and loss of balance on 3 trials. Patient required a few therapeutic rest breaks throughout session. Patient continued to demonstrate difficulty with single  leg stance balance, using upper extremities for support throughout. Future sessions should  continue to progress patient with balance exercises.     Rehab Potential  Good    PT Frequency  2x / week    PT Duration  3 weeks    PT Treatment/Interventions  ADLs/Self Care Home Management;Cryotherapy;Electrical Stimulation;Moist Heat;Gait training;DME Instruction;Stair training;Functional mobility training;Therapeutic activities;Therapeutic exercise;Balance training;Neuromuscular re-education;Patient/family education;Energy conservation    PT Next Visit Plan  Continue with tandem gait and retro gait for balance training. Initiate half knee to stand transfer training. Continue proximal hip strengthening and single limb balance training, progressing as able. Perform step ups and downs on low step for strength next session. Perform manual to Rt knee to address pain and determine if this improves balance.    PT Home Exercise Plan  Eval: bridge; 2/20: SLR, SLS; 05/25/17 - marching at counter; 05/28/17 - tandem at counter; 4/1/ 19 -sit to stands;     Consulted and Agree with Plan of Care  Patient       Patient will benefit from skilled therapeutic intervention in order to improve the following deficits and impairments:  Abnormal gait, Decreased activity tolerance, Decreased endurance, Decreased strength, Decreased balance, Decreased mobility, Difficulty walking, Obesity, Improper body mechanics, Impaired sensation  Visit Diagnosis: Other abnormalities of gait and mobility  Muscle weakness (generalized)  History of falling     Problem List Patient Active Problem List   Diagnosis Date Noted  . Gastroesophageal reflux disease 03/13/2016  . Prostate cancer metastatic to bone ([place]) 09/05/2015  . Nausea 08/21/2015  . SOB (shortness of breath) on exertion 08/21/2015  . 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 Osteoarthritis of right knee 01/16/2014   Clarene Critchley PT, DPT 12:07 PM, 06/19/17 [place], [place], [address] Phone: [phone]   Fax:  [phone]  Name: Micheal Davis MRN: [id number] Date of Birth: March 04, 1945

## 2017-06-22 ENCOUNTER — Ambulatory Visit (HOSPITAL_COMMUNITY): Payer: Medicare Other

## 2017-06-22 ENCOUNTER — Other Ambulatory Visit: Payer: Self-pay

## 2017-06-22 ENCOUNTER — Other Ambulatory Visit (HOSPITAL_COMMUNITY): Payer: Self-pay | Admitting: Pharmacist

## 2017-06-22 ENCOUNTER — Encounter (HOSPITAL_COMMUNITY): Payer: Self-pay

## 2017-06-22 DIAGNOSIS — Z9181 History of falling: Secondary | ICD-10-CM

## 2017-06-22 DIAGNOSIS — M6281 Muscle weakness (generalized): Secondary | ICD-10-CM | POA: Diagnosis not present

## 2017-06-22 DIAGNOSIS — R2689 Other abnormalities of gait and mobility: Secondary | ICD-10-CM | POA: Diagnosis not present

## 2017-06-22 NOTE — Therapy (Signed)
[place], [place], [address] Phone: [phone]   Fax:  [phone]  Physical Therapy Treatment/Discharge Summary  Patient Details  Name: Micheal Davis Davis MRN: [id number] Date of Birth: [birth year] Referring Provider: Creola Corn, MD   Encounter Date: 06/22/2017   PHYSICAL THERAPY DISCHARGE SUMMARY  Visits from Start of Care: 13  Current functional level related to goals / functional outcomes: Re-assessment performed today and patient has continued to progress towards remaining goals. He has met/partially met 3/3 short term goals and 2/3 long term goals. He has made significant improvement in 5x sit to stand time from 18 seconds to 14 seconds with no use of UE, however, did not meet his goal of 12 seconds or less. He has been compliant with HEP and reports feeling confident in continuing to progress with exercises independently. He also has shown interest in and been educated on benefits of walking program and regular participation in exercise routine at local fitness facility. Micheal Davis Davis will be discharged after this session based on his functional mobility status and improvements towards goals demonstrating improve strength and decreased fall risk.   Remaining deficits: See below details.   Education / Equipment: Educate don progress towrds goals. Reviewed HEP and educated on benefits of walkign program or gym/[employer] membership to transition to regular exercise program.   Plan: Patient agrees to discharge.  Patient goals were partially met. Patient is being discharged due to meeting the stated rehab goals.  ?????       PT End of Session - 06/22/17 0846    Visit Number  13    Number of Visits  19    Date for PT Re-Evaluation  06/22/17 minireassessment complete 06/01/2017    Authorization Type  Medicare Part A and B    Authorization Time Period  04/20/17 - 06/01/17; 06/01/17-06/22/17    Authorization - Visit  Number  7    Authorization - Number of Visits  10    PT Start Time  0818    PT Stop Time  0858    PT Time Calculation (min)  40 min    Equipment Utilized During Treatment  Gait belt    Activity Tolerance  Patient tolerated treatment well    Behavior During Therapy  WFL for tasks assessed/performed       Past Medical History:  Diagnosis Date  . Diabetes mellitus without complication ([place])   . Hypertension   . Prostate cancer ([place]) 09/05/2015  . Prostate cancer metastatic to bone ([place]) 09/05/2015  . Sleep apnea   . Thyroid disease     Past Surgical History:  Procedure Laterality Date  . CATARACT EXTRACTION    . PORTACATH PLACEMENT Left 12/31/2015   Procedure: INSERTION PORT-A-CATH LEFT SUBCLAVIAN;  Surgeon: Aviva Signs, MD;  Location: [facility];  Service: General;  Laterality: Left;  . REPLACEMENT TOTAL KNEE Left     There were no vitals filed for this visit.  Subjective Assessment - 06/22/17 0836    Subjective  Patient arrives and states he is doing well and had a pleasant weekend overall. He had his follow up with his oncologist last week and will be starting Chemotherapy this Wednesday at his next appointment with Dr. Raliegh Davis. He reports it will be a lower dose of chemo to prevent him from becoming over-fatigued. He states usually chemo helps his back and legs feel better. He states he has been doing his HEP but mostly the balance exercises at the counter.  He reports he purchased a tiller that is set up in the back to move easier and not shake as much and he is planning to try to use it this week now that the weather is nice. He reports he has a step machine at home that his wife and he both have discussed starting to use to continue exercises and states she walks everyday about a mile and he goes with her sometimes. He states he usually walks about 1/3 of a mile or up to 1/2.     Pertinent History  Stage 4 prostate cancer, Left TKA    Limitations  Walking;House hold activities    Currently  in Pain?  No/denies         [facility] PT Assessment - 06/22/17 0001      Assessment   Medical Diagnosis  Gait and Balance abnormalities    Referring Provider  Micheal Corn, MD    Next MD Visit  Dr. Raliegh Davis for chemo follow up on Wednesday    Prior Therapy  8-9 years ago for Lt TKA      Precautions   Precautions  Fall      Restrictions   Weight Bearing Restrictions  No      Observation/Other Assessments   Focus on Therapeutic Outcomes (FOTO)   36% limited 41% limited on 06/11/17      Single Leg Stance   Comments  Rt LE = 5 seconds; Lt LE = 8 seconds 06/01/17: Rt LE = 5 seconds; Lt LE = 14 seconds      Strength   Right Hip Flexion  4/5    Right Hip Extension  4+/5    Right Hip ABduction  4+/5    Left Hip Flexion  4/5    Left Hip Extension  4+/5    Left Hip ABduction  4+/5    Right Knee Flexion  4+/5    Right Knee Extension  5/5    Left Knee Flexion  4+/5    Left Knee Extension  5/5    Right Ankle Dorsiflexion  5/5    Left Ankle Dorsiflexion  5/5      Transfers   Five time sit to stand comments   14.42; no UE use      Ambulation/Gait   Stairs  Yes      Dynamic Gait Index   Level Surface  Normal    Change in Gait Speed  Normal    Gait with Horizontal Head Turns  Normal    Gait with Vertical Head Turns  Normal    Gait and Pivot Turn  Normal    Step Over Obstacle  Normal    Step Around Obstacles  Mild Impairment    Steps  Moderate Impairment    Total Score  21    DGI comment:  21/24 indicated decreased risk of falling        [facility] Adult PT Treatment/Exercise - 06/22/17 0001      Knee/Hip Exercises: Supine   Bridges  2 sets;10 reps;Limitations    Bridges Limitations  3 second holds    Straight Leg Raises  Both;1 set;10 reps;Limitations    Straight Leg Raises Limitations  2 second holds       Balance Exercises - 06/22/17 0850      Balance Exercises: Standing   Tandem Stance  Eyes open;4 reps;15 secs;Intermittent upper extremity support    SLS  Eyes  open;Solid surface;Intermittent upper extremity support;Other (comment);4 reps;15 secs    Other Standing  Exercises  Warrior pose 1; 2x each LE 20 seconds       PT Education - 06/22/17 0847    Education provided  Yes    Education Details  Educate don progress towrds goals. Reviewed HEP and educated on benefits of walkign program or gym/[employer] membership to transition to regular exercise program.     Person(s) Educated  Patient    Methods  Explanation;Handout    Comprehension  Verbalized understanding       PT Short Term Goals - 06/22/17 0820      PT SHORT TERM GOAL #1   Title  Patient will be independent with HEP    Baseline  05/18/17 - limited by sickness secondary to cemotherapy    Time  3    Period  Weeks    Status  Achieved      PT SHORT TERM GOAL #2   Title  Patient will improve MMT for limited groups by 1/2 grade to demonstrate improved functional strength for improved performance with stairs and gait.    Baseline  05/18/17 - all 1/2 improved    Time  3    Period  Weeks    Status  Achieved      PT SHORT TERM GOAL #3   Title  Patient will demosntrate improved balance by performing SLS for Bil LE for 10 seconds each to imprvoe performance to ascend/descend stairs with decreased risk of falling.    Baseline  05/18/17 - Rt EL = 5 sec, Lt LE =14 sec; 06/22/17 - Rt LE = 5 sec, Lt LE = 8 sec    Time  3    Period  Weeks    Status  Partially Met        PT Long Term Goals - 06/22/17 3235      PT LONG TERM GOAL #1   Title  Patient will improve MMT for limited groups by 1 grade to demonstrate improved functional strength for improved performance with stairs and gait.    Baseline  05/18/17 - all 1/2 improved; 06/22/17 - >50% of limited groups improved by 1 grade and remaining groups improved by 1/2 grade    Time  3    Period  Weeks    Status  Partially Met      PT LONG TERM GOAL #2   Title  Patient will perform 5x sit to stand in = or <12 seconds to demonstrate improve functional LE  strength and to decrease risk of falling.    Baseline  06/01/17 - 16 seconds, no UE; 06/22/17 - 14.6 seconds    Time  3    Period  Weeks    Status  Not Met      PT LONG TERM GOAL #3   Title  Patient will score =>20/24 on the DGI to demonstrate significant improvement in balance during gait and decrease risk of falling.    Baseline  06/01/17 - 20/24; 06/22/17 - 21/24    Time  3    Period  Weeks    Status  Achieved       Plan - 06/22/17 0901    Clinical Impression Statement  Re-assessment performed today and patient has continued to progress towards remaining goals. He has met/partially met 3/3 short term goals and 2/3 long term goals. He has made significant improvement in 5x sit to stand time from 18 seconds to 14 seconds with no use of UE, however, did not meet his goal of 12 seconds or less.  He has been compliant with HEP and reports feeling confident in continuing to progress with exercises independently. He also has shown interest in and been educated on benefits of walking program and regular participation in exercise routine at local fitness facility. Mr. Busby will be discharged after this session based on his functional mobility status and improvements towards goals demonstrating improve strength and decreased fall risk.    Rehab Potential  Good    PT Frequency  2x / week    PT Duration  3 weeks    PT Treatment/Interventions  ADLs/Self Care Home Management;Cryotherapy;Electrical Stimulation;Moist Heat;Gait training;DME Instruction;Stair training;Functional mobility training;Therapeutic activities;Therapeutic exercise;Balance training;Neuromuscular re-education;Patient/family education;Energy conservation    PT Next Visit Plan  Discharge this session    PT Home Exercise Plan  Eval: bridge; 2/20: SLR, SLS; 05/25/17 - marching at counter; 05/28/17 - tandem at counter; 4/1/ 19 -sit to stands; warrior pose 1    Consulted and Agree with Plan of Care  Patient       Patient will benefit from  skilled therapeutic intervention in order to improve the following deficits and impairments:  Abnormal gait, Decreased activity tolerance, Decreased endurance, Decreased strength, Decreased balance, Decreased mobility, Difficulty walking, Obesity, Improper body mechanics, Impaired sensation  Visit Diagnosis: Other abnormalities of gait and mobility  Muscle weakness (generalized)  History of falling     Problem List Patient Active Problem List   Diagnosis Date Noted  . Gastroesophageal reflux disease 03/13/2016  . Prostate cancer metastatic to bone ([place]) 09/05/2015  . Nausea 08/21/2015  . SOB (shortness of breath) on exertion 08/21/2015  . 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 Osteoarthritis of right knee 01/16/2014    Kipp Brood, PT, DPT Physical Therapist with [facility]  06/22/2017 9:02 AM    [place], [place], [address] Phone: [phone]   Fax:  [phone]  Name: Micheal Davis Davis MRN: [id number] Date of Birth: Feb 22, 1946

## 2017-06-24 ENCOUNTER — Encounter (HOSPITAL_COMMUNITY): Payer: Self-pay | Admitting: Hematology

## 2017-06-25 ENCOUNTER — Inpatient Hospital Stay (HOSPITAL_COMMUNITY): Payer: Medicare Other

## 2017-06-25 ENCOUNTER — Encounter (HOSPITAL_COMMUNITY): Payer: Medicare Other

## 2017-06-25 ENCOUNTER — Encounter (HOSPITAL_COMMUNITY): Payer: Self-pay

## 2017-06-25 VITALS — BP 126/71 | HR 50 | Temp 97.5°F | Resp 18 | Wt 223.8 lb

## 2017-06-25 DIAGNOSIS — C61 Malignant neoplasm of prostate: Secondary | ICD-10-CM

## 2017-06-25 DIAGNOSIS — C7951 Secondary malignant neoplasm of bone: Principal | ICD-10-CM

## 2017-06-25 DIAGNOSIS — G609 Hereditary and idiopathic neuropathy, unspecified: Secondary | ICD-10-CM | POA: Diagnosis not present

## 2017-06-25 DIAGNOSIS — Z5111 Encounter for antineoplastic chemotherapy: Secondary | ICD-10-CM | POA: Diagnosis not present

## 2017-06-25 DIAGNOSIS — E119 Type 2 diabetes mellitus without complications: Secondary | ICD-10-CM | POA: Diagnosis not present

## 2017-06-25 DIAGNOSIS — C787 Secondary malignant neoplasm of liver and intrahepatic bile duct: Secondary | ICD-10-CM | POA: Diagnosis not present

## 2017-06-25 LAB — CBC WITH DIFFERENTIAL/PLATELET
Basophils Absolute: 0 10*3/uL (ref 0.0–0.1)
Basophils Relative: 0 %
Eosinophils Absolute: 0.1 10*3/uL (ref 0.0–0.7)
Eosinophils Relative: 2 %
HEMATOCRIT: 36.1 % — AB (ref 39.0–52.0)
HEMOGLOBIN: 11.2 g/dL — AB (ref 13.0–17.0)
LYMPHS ABS: 1.1 10*3/uL (ref 0.7–4.0)
LYMPHS PCT: 19 %
MCH: 27.3 pg (ref 26.0–34.0)
MCHC: 31 g/dL (ref 30.0–36.0)
MCV: 88 fL (ref 78.0–100.0)
MONOS PCT: 7 %
Monocytes Absolute: 0.4 10*3/uL (ref 0.1–1.0)
NEUTROS PCT: 72 %
Neutro Abs: 4.3 10*3/uL (ref 1.7–7.7)
Platelets: 281 10*3/uL (ref 150–400)
RBC: 4.1 MIL/uL — ABNORMAL LOW (ref 4.22–5.81)
RDW: 15.4 % (ref 11.5–15.5)
WBC: 5.9 10*3/uL (ref 4.0–10.5)

## 2017-06-25 LAB — COMPREHENSIVE METABOLIC PANEL
ALK PHOS: 227 U/L — AB (ref 38–126)
ALT: 14 U/L — ABNORMAL LOW (ref 17–63)
AST: 18 U/L (ref 15–41)
Albumin: 3.5 g/dL (ref 3.5–5.0)
Anion gap: 14 (ref 5–15)
BILIRUBIN TOTAL: 0.6 mg/dL (ref 0.3–1.2)
BUN: 15 mg/dL (ref 6–20)
CALCIUM: 9.4 mg/dL (ref 8.9–10.3)
CO2: 26 mmol/L (ref 22–32)
CREATININE: 0.74 mg/dL (ref 0.61–1.24)
Chloride: 101 mmol/L (ref 101–111)
Glucose, Bld: 188 mg/dL — ABNORMAL HIGH (ref 65–99)
Potassium: 4.4 mmol/L (ref 3.5–5.1)
SODIUM: 141 mmol/L (ref 135–145)
TOTAL PROTEIN: 6.8 g/dL (ref 6.5–8.1)

## 2017-06-25 LAB — PSA: Prostatic Specific Antigen: 196 ng/mL — ABNORMAL HIGH (ref 0.00–4.00)

## 2017-06-25 MED ORDER — LEUPROLIDE ACETATE 7.5 MG IM KIT
7.5000 mg | PACK | INTRAMUSCULAR | Status: DC
  Administered 2017-06-25: 7.5 mg via INTRAMUSCULAR
  Filled 2017-06-25: qty 7.5

## 2017-06-25 MED ORDER — DENOSUMAB 120 MG/1.7ML ~~LOC~~ SOLN
120.0000 mg | Freq: Once | SUBCUTANEOUS | Status: AC
  Administered 2017-06-25: 120 mg via SUBCUTANEOUS
  Filled 2017-06-25: qty 1.7

## 2017-06-25 MED ORDER — DEXAMETHASONE SODIUM PHOSPHATE 10 MG/ML IJ SOLN
10.0000 mg | Freq: Once | INTRAMUSCULAR | Status: AC
  Administered 2017-06-25: 10 mg via INTRAVENOUS
  Filled 2017-06-25: qty 1

## 2017-06-25 MED ORDER — SODIUM CHLORIDE 0.9 % IV SOLN: Freq: Once | INTRAVENOUS | Status: DC

## 2017-06-25 MED ORDER — SODIUM CHLORIDE 0.9 % IV SOLN
60.0000 mg/m2 | Freq: Once | INTRAVENOUS | Status: AC
  Administered 2017-06-25: 130 mg via INTRAVENOUS
  Filled 2017-06-25: qty 13

## 2017-06-25 MED ORDER — HEPARIN SOD (PORK) LOCK FLUSH 100 UNIT/ML IV SOLN
500.0000 [IU] | Freq: Once | INTRAVENOUS | Status: AC | PRN
  Administered 2017-06-25: 500 [IU]
  Filled 2017-06-25: qty 5

## 2017-06-25 MED ORDER — DEXAMETHASONE SODIUM PHOSPHATE 100 MG/10ML IJ SOLN: 10.0000 mg | Freq: Once | INTRAMUSCULAR | Status: DC

## 2017-06-25 MED ORDER — SODIUM CHLORIDE 0.9 % IV SOLN
Freq: Once | INTRAVENOUS | Status: AC
  Administered 2017-06-25: 500 mL via INTRAVENOUS

## 2017-06-25 MED ORDER — DIPHENHYDRAMINE HCL 50 MG/ML IJ SOLN
25.0000 mg | Freq: Once | INTRAMUSCULAR | Status: AC
  Administered 2017-06-25: 25 mg via INTRAVENOUS
  Filled 2017-06-25: qty 1

## 2017-06-25 MED ORDER — ONDANSETRON 8 MG PO TBDP
8.0000 mg | ORAL_TABLET | Freq: Once | ORAL | Status: AC
  Administered 2017-06-25: 8 mg via ORAL
  Filled 2017-06-25: qty 1

## 2017-06-25 MED ORDER — MORPHINE SULFATE ER 60 MG PO TBCR: 60.0000 mg | EXTENDED_RELEASE_TABLET | Freq: Two times a day (BID) | ORAL | 0 refills | Status: DC

## 2017-06-25 MED ORDER — PEGFILGRASTIM 6 MG/0.6ML ~~LOC~~ PSKT
6.0000 mg | PREFILLED_SYRINGE | Freq: Once | SUBCUTANEOUS | Status: AC
  Administered 2017-06-25: 6 mg via SUBCUTANEOUS
  Filled 2017-06-25: qty 0.6

## 2017-06-25 MED ORDER — SODIUM CHLORIDE 0.9% FLUSH
10.0000 mL | INTRAVENOUS | Status: DC | PRN
  Administered 2017-06-25: 10 mL
  Filled 2017-06-25: qty 10

## 2017-06-25 NOTE — Patient Instructions (Signed)
[facility] Discharge Instructions for Patients Receiving Chemotherapy  Today you received the following chemotherapy agents taxotere and neulasta onpro.    If you develop nausea and vomiting that is not controlled by your nausea medication, call the clinic.   BELOW ARE SYMPTOMS THAT SHOULD BE REPORTED IMMEDIATELY:  *FEVER GREATER THAN 100.5 F  *CHILLS WITH OR WITHOUT FEVER  NAUSEA AND VOMITING THAT IS NOT CONTROLLED WITH YOUR NAUSEA MEDICATION  *UNUSUAL SHORTNESS OF BREATH  *UNUSUAL BRUISING OR BLEEDING  TENDERNESS IN MOUTH AND THROAT WITH OR WITHOUT PRESENCE OF ULCERS  *URINARY PROBLEMS  *BOWEL PROBLEMS  UNUSUAL RASH Items with * indicate a potential emergency and should be followed up as soon as possible.  Feel free to call the clinic should you have any questions or concerns. The clinic phone number is [phone].  Please show the [facility] at check-in to the Emergency Department and triage nurse.

## 2017-06-25 NOTE — Progress Notes (Signed)
To treatment area for taxotere.  Consent signed with all questions asked and answered.  Patient stated neuropathy in feet but no worsening.  Has completed Physical Therapy.  No changes in finger neuropathy.  Prn diarrhea and constipation with OTC medications for relief.   Trude Mcburney with oncologist.  Last shot was 04/23/2017 due to tooth extraction scheduled for 05/27/2017.  Ok to give xgeva verbal order Dr. [name].  Patients gum is pink with no drainage.  Denied tooth or jaw pain.    Patient tolerated treatment with no complaints voiced.  Neulasta Onpro with green flashing light and no alarms sounding.  Port site clean and dry with no bruising or swelling noted at site.  No complaints of pain with flush.  Band aid applied.  VSS with discharge and left ambulatory with wife with no s/s of distress noted.

## 2017-06-26 ENCOUNTER — Encounter (HOSPITAL_COMMUNITY): Payer: Medicare Other

## 2017-06-26 ENCOUNTER — Ambulatory Visit (HOSPITAL_COMMUNITY): Payer: Medicare Other

## 2017-07-01 ENCOUNTER — Other Ambulatory Visit (HOSPITAL_COMMUNITY): Payer: Self-pay | Admitting: Internal Medicine

## 2017-07-01 DIAGNOSIS — T451X5A Adverse effect of antineoplastic and immunosuppressive drugs, initial encounter: Secondary | ICD-10-CM

## 2017-07-01 DIAGNOSIS — C7951 Secondary malignant neoplasm of bone: Principal | ICD-10-CM

## 2017-07-01 DIAGNOSIS — C61 Malignant neoplasm of prostate: Secondary | ICD-10-CM

## 2017-07-01 DIAGNOSIS — G620 Drug-induced polyneuropathy: Secondary | ICD-10-CM

## 2017-07-06 ENCOUNTER — Telehealth (HOSPITAL_COMMUNITY): Payer: Self-pay | Admitting: *Deleted

## 2017-07-06 ENCOUNTER — Other Ambulatory Visit (HOSPITAL_COMMUNITY): Payer: Self-pay | Admitting: *Deleted

## 2017-07-06 DIAGNOSIS — T451X5A Adverse effect of antineoplastic and immunosuppressive drugs, initial encounter: Secondary | ICD-10-CM

## 2017-07-06 DIAGNOSIS — C7951 Secondary malignant neoplasm of bone: Principal | ICD-10-CM

## 2017-07-06 DIAGNOSIS — C61 Malignant neoplasm of prostate: Secondary | ICD-10-CM

## 2017-07-06 DIAGNOSIS — G620 Drug-induced polyneuropathy: Secondary | ICD-10-CM

## 2017-07-06 MED ORDER — GABAPENTIN 300 MG PO CAPS: ORAL_CAPSULE | ORAL | 3 refills | Status: DC

## 2017-07-07 ENCOUNTER — Other Ambulatory Visit (HOSPITAL_COMMUNITY): Payer: Self-pay | Admitting: Adult Health

## 2017-07-07 DIAGNOSIS — C61 Malignant neoplasm of prostate: Secondary | ICD-10-CM

## 2017-07-07 DIAGNOSIS — G620 Drug-induced polyneuropathy: Secondary | ICD-10-CM

## 2017-07-07 DIAGNOSIS — T451X5A Adverse effect of antineoplastic and immunosuppressive drugs, initial encounter: Secondary | ICD-10-CM

## 2017-07-07 DIAGNOSIS — C7951 Secondary malignant neoplasm of bone: Principal | ICD-10-CM

## 2017-07-07 MED ORDER — GABAPENTIN 300 MG PO CAPS: ORAL_CAPSULE | ORAL | 3 refills | Status: DC

## 2017-07-07 NOTE — Telephone Encounter (Signed)
Done   gwd

## 2017-07-16 ENCOUNTER — Inpatient Hospital Stay (HOSPITAL_BASED_OUTPATIENT_CLINIC_OR_DEPARTMENT_OTHER): Payer: Medicare Other | Admitting: Hematology

## 2017-07-16 ENCOUNTER — Encounter (HOSPITAL_COMMUNITY): Payer: Self-pay | Admitting: Hematology

## 2017-07-16 ENCOUNTER — Inpatient Hospital Stay (HOSPITAL_COMMUNITY): Payer: Medicare Other

## 2017-07-16 ENCOUNTER — Inpatient Hospital Stay (HOSPITAL_COMMUNITY): Payer: Medicare Other | Attending: Hematology

## 2017-07-16 VITALS — BP 130/62 | HR 56 | Temp 98.1°F | Resp 16 | Wt 222.2 lb

## 2017-07-16 DIAGNOSIS — Z5111 Encounter for antineoplastic chemotherapy: Secondary | ICD-10-CM | POA: Diagnosis not present

## 2017-07-16 DIAGNOSIS — C7951 Secondary malignant neoplasm of bone: Principal | ICD-10-CM

## 2017-07-16 DIAGNOSIS — R52 Pain, unspecified: Secondary | ICD-10-CM | POA: Insufficient documentation

## 2017-07-16 DIAGNOSIS — G629 Polyneuropathy, unspecified: Secondary | ICD-10-CM | POA: Diagnosis not present

## 2017-07-16 DIAGNOSIS — C61 Malignant neoplasm of prostate: Secondary | ICD-10-CM

## 2017-07-16 DIAGNOSIS — E079 Disorder of thyroid, unspecified: Secondary | ICD-10-CM | POA: Diagnosis not present

## 2017-07-16 DIAGNOSIS — Z5189 Encounter for other specified aftercare: Secondary | ICD-10-CM | POA: Insufficient documentation

## 2017-07-16 DIAGNOSIS — E119 Type 2 diabetes mellitus without complications: Secondary | ICD-10-CM | POA: Insufficient documentation

## 2017-07-16 LAB — CBC WITH DIFFERENTIAL/PLATELET
BASOS PCT: 1 %
Basophils Absolute: 0.1 10*3/uL (ref 0.0–0.1)
EOS ABS: 0 10*3/uL (ref 0.0–0.7)
EOS PCT: 0 %
HEMATOCRIT: 34.7 % — AB (ref 39.0–52.0)
Hemoglobin: 10.9 g/dL — ABNORMAL LOW (ref 13.0–17.0)
Lymphocytes Relative: 14 %
Lymphs Abs: 1.2 10*3/uL (ref 0.7–4.0)
MCH: 28 pg (ref 26.0–34.0)
MCHC: 31.4 g/dL (ref 30.0–36.0)
MCV: 89.2 fL (ref 78.0–100.0)
MONO ABS: 0.7 10*3/uL (ref 0.1–1.0)
Monocytes Relative: 8 %
Neutro Abs: 6.2 10*3/uL (ref 1.7–7.7)
Neutrophils Relative %: 77 %
PLATELETS: 265 10*3/uL (ref 150–400)
RBC: 3.89 MIL/uL — ABNORMAL LOW (ref 4.22–5.81)
RDW: 17.4 % — AB (ref 11.5–15.5)
WBC: 8.1 10*3/uL (ref 4.0–10.5)

## 2017-07-16 LAB — COMPREHENSIVE METABOLIC PANEL
ALBUMIN: 3.6 g/dL (ref 3.5–5.0)
ALT: 15 U/L — ABNORMAL LOW (ref 17–63)
ANION GAP: 11 (ref 5–15)
AST: 43 U/L — ABNORMAL HIGH (ref 15–41)
Alkaline Phosphatase: 205 U/L — ABNORMAL HIGH (ref 38–126)
BILIRUBIN TOTAL: 0.7 mg/dL (ref 0.3–1.2)
BUN: 19 mg/dL (ref 6–20)
CHLORIDE: 100 mmol/L — AB (ref 101–111)
CO2: 25 mmol/L (ref 22–32)
Calcium: 8.6 mg/dL — ABNORMAL LOW (ref 8.9–10.3)
Creatinine, Ser: 0.82 mg/dL (ref 0.61–1.24)
GFR calc Af Amer: >60 mL/min (ref >60)
GFR calc non Af Amer: >60 mL/min (ref >60)
GLUCOSE: 164 mg/dL — AB (ref 65–99)
POTASSIUM: 3.4 mmol/L — AB (ref 3.5–5.1)
SODIUM: 136 mmol/L (ref 135–145)
Total Protein: 6.6 g/dL (ref 6.5–8.1)

## 2017-07-16 LAB — PSA: Prostatic Specific Antigen: 202 ng/mL — ABNORMAL HIGH (ref 0.00–4.00)

## 2017-07-16 MED ORDER — HEPARIN SOD (PORK) LOCK FLUSH 100 UNIT/ML IV SOLN
500.0000 [IU] | Freq: Once | INTRAVENOUS | Status: AC | PRN
  Administered 2017-07-16: 500 [IU]
  Filled 2017-07-16 (×2): qty 5

## 2017-07-16 MED ORDER — PEGFILGRASTIM 6 MG/0.6ML ~~LOC~~ PSKT
6.0000 mg | PREFILLED_SYRINGE | Freq: Once | SUBCUTANEOUS | Status: AC
  Administered 2017-07-16: 6 mg via SUBCUTANEOUS
  Filled 2017-07-16: qty 0.6

## 2017-07-16 MED ORDER — SODIUM CHLORIDE 0.9% FLUSH
10.0000 mL | INTRAVENOUS | Status: DC | PRN
  Administered 2017-07-16: 10 mL
  Filled 2017-07-16: qty 10

## 2017-07-16 MED ORDER — MORPHINE SULFATE ER 60 MG PO TBCR: 60.0000 mg | EXTENDED_RELEASE_TABLET | Freq: Two times a day (BID) | ORAL | 0 refills | Status: AC

## 2017-07-16 MED ORDER — DIPHENHYDRAMINE HCL 50 MG/ML IJ SOLN
25.0000 mg | Freq: Once | INTRAMUSCULAR | Status: AC
  Administered 2017-07-16: 25 mg via INTRAVENOUS
  Filled 2017-07-16: qty 1

## 2017-07-16 MED ORDER — SODIUM CHLORIDE 0.9 % IV SOLN: Freq: Once | INTRAVENOUS | Status: DC

## 2017-07-16 MED ORDER — SODIUM CHLORIDE 0.9 % IV SOLN
Freq: Once | INTRAVENOUS | Status: AC
  Administered 2017-07-16: 11:00:00 via INTRAVENOUS

## 2017-07-16 MED ORDER — HEPARIN SOD (PORK) LOCK FLUSH 100 UNIT/ML IV SOLN
INTRAVENOUS | Status: AC
  Filled 2017-07-16: qty 5

## 2017-07-16 MED ORDER — MORPHINE SULFATE 30 MG PO TABS
30.0000 mg | ORAL_TABLET | Freq: Four times a day (QID) | ORAL | 0 refills | Status: DC | PRN
Start: 2017-07-16 — End: 2017-08-31

## 2017-07-16 MED ORDER — DEXAMETHASONE SODIUM PHOSPHATE 10 MG/ML IJ SOLN
10.0000 mg | Freq: Once | INTRAMUSCULAR | Status: AC
  Administered 2017-07-16: 10 mg via INTRAVENOUS
  Filled 2017-07-16: qty 1

## 2017-07-16 MED ORDER — SODIUM CHLORIDE 0.9 % IV SOLN
60.0000 mg/m2 | Freq: Once | INTRAVENOUS | Status: AC
  Administered 2017-07-16: 130 mg via INTRAVENOUS
  Filled 2017-07-16: qty 13

## 2017-07-16 MED ORDER — SODIUM CHLORIDE 0.9 % IV SOLN: 10.0000 mg | Freq: Once | INTRAVENOUS | Status: DC

## 2017-07-16 MED ORDER — ONDANSETRON 8 MG PO TBDP
8.0000 mg | ORAL_TABLET | Freq: Once | ORAL | Status: AC
  Administered 2017-07-16: 8 mg via ORAL
  Filled 2017-07-16: qty 1

## 2017-07-16 NOTE — Patient Instructions (Signed)
[facility] Discharge Instructions for Patients Receiving Chemotherapy   Beginning January 23rd 2017 lab work for the Cancer Center will be done in the  Main lab at [facility] on 1st floor. If you have a lab appointment with the Cancer Center please come in thru the  Main Entrance and check in at the main information desk   Today you received the following chemotherapy agents   To help prevent nausea and vomiting after your treatment, we encourage you to take your nausea medication     If you develop nausea and vomiting, or diarrhea that is not controlled by your medication, call the clinic.  The clinic phone number is [phone]. Office hours are Monday-Friday 8:30am-5:00pm.  BELOW ARE SYMPTOMS THAT SHOULD BE REPORTED IMMEDIATELY:  *FEVER GREATER THAN 101.0 F  *CHILLS WITH OR WITHOUT FEVER  NAUSEA AND VOMITING THAT IS NOT CONTROLLED WITH YOUR NAUSEA MEDICATION  *UNUSUAL SHORTNESS OF BREATH  *UNUSUAL BRUISING OR BLEEDING  TENDERNESS IN MOUTH AND THROAT WITH OR WITHOUT PRESENCE OF ULCERS  *URINARY PROBLEMS  *BOWEL PROBLEMS  UNUSUAL RASH Items with * indicate a potential emergency and should be followed up as soon as possible. If you have an emergency after office hours please contact your primary care physician or go to the nearest emergency department.  Please call the clinic during office hours if you have any questions or concerns.   You may also contact the Patient Navigator at [phone] should you have any questions or need assistance in obtaining follow up care.      Resources For Cancer Patients and their Caregivers ? American Cancer Society: Can assist with transportation, wigs, general needs, runs Look Good Feel Better.        1-888-227-6333 ? Cancer Care: Provides financial assistance, online support groups, medication/co-pay assistance.  1-800-813-HOPE (4673) ? [facility] Assists [place] cancer  patients and their families through emotional , educational and financial support.  336-427-4357 ? [place] DSS Where to apply for food stamps, Medicaid and utility assistance. 336-342-1394 ? [facility]: Transportation to medical appointments. 336-347-2287 ? Social Security Administration: May apply for disability if have a Stage IV cancer. 336-342-7796 1-800-772-1213 ? [place] Aging, Disability and Transit Services: Assists with nutrition, care and transit needs. 336-349-2343         

## 2017-07-16 NOTE — Assessment & Plan Note (Signed)
1.  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 - BRCA 1/2 negative, PDL 1 expression of 0% -Foundation 1 testing shows MS-stable, TMB intermediate, no other significant alterations to modify therapy. -Most recent progression on cabazitaxel.  He has used [place] in the past.  He had received 2 cycles of docetaxel in the beginning.  It is unclear whether this was discontinued secondary to progression of disease.   -Cycle 1 of docetaxel at 20% dose reduction started on 06/25/2017.  He has tolerated his first cycle fairly well.  He had some body pains in the back and hips which started 1 week after his last treatment.  Less likely from docetaxel, more likely from Neulasta.  He did not require any extra pain medication.  Today his blood counts are adequate to proceed with cycle 2 without any further dose modifications.  I will see him back in 3 weeks for follow-up.  We plan to repeat scans after cycle 3.  We will also repeat PSA at that time.  2.  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  This is from prior chemotherapy.  He takes gabapentin 1 tablet in the morning and 2 tablets at bedtime.  This has not gotten worse after start of docetaxel.

## 2017-07-16 NOTE — Patient Instructions (Signed)
[place] at [facility] Discharge Instructions  You saw Dr, [name] today   Thank you for choosing [place] at [facility] to provide your oncology and hematology care.  To afford each patient quality time with our provider, please arrive at least 15 minutes before your scheduled appointment time.   If you have a lab appointment with the [facility] please come in thru the  Main Entrance and check in at the main information desk  You need to re-schedule your appointment should you arrive 10 or more minutes late.  We strive to give you quality time with our providers, and arriving late affects you and other patients whose appointments are after yours.  Also, if you no show three or more times for appointments you may be dismissed from the clinic at the providers discretion.     Again, thank you for choosing [facility].  Our hope is that these requests will decrease the amount of time that you wait before being seen by our physicians.       _____________________________________________________________  Should you have questions after your visit to [facility], please contact our office at [phone] between the hours of 8:30 a.m. and 4:30 p.m.  Voicemails left after 4:30 p.m. will not be returned until the following business day.  For prescription refill requests, have your pharmacy contact our office.       Resources For Cancer Patients and their Caregivers ? American Cancer Society: Can assist with transportation, wigs, general needs, runs Look Good Feel Better.        ([phone] ? Cancer Care: Provides financial assistance, online support groups, medication/co-pay assistance.  1-800-813-HOPE 4371349594) ? [place] Assists [place] cancer patients and their families through emotional , educational and financial support.  7045582572 ? [place] DSS Where to apply for  food stamps, Medicaid and utility assistance. 530-575-5307 ? RCATS: Transportation to medical appointments. 954-327-8460 ? Social Security Administration: May apply for disability if have a Stage IV cancer. ([phone] 504-651-2036 ? [employer], Disability and Transit Services: Assists with nutrition, care and transit needs. [place] Support Programs:   > Cancer Support Group  2nd Tuesday of the month 1pm-2pm, Journey Room   > Creative Journey  3rd Tuesday of the month 1130am-1pm, Journey Room

## 2017-07-16 NOTE — Progress Notes (Signed)
[place], [place] [address]   CLINIC:  Medical Oncology/Hematology  PCP:  [name], MD [place] [address] [phone]   REASON FOR VISIT:  Follow-up for metastatic prostate cancer.  CURRENT THERAPY: Docetaxel   BRIEF ONCOLOGIC HISTORY:    Prostate cancer metastatic to bone ([place])   10/01/2012 Initial Diagnosis    Prostate biopsied with highest Gleason score of 9 seen and the lowest score was 7.      10/04/2012 - 05/16/2013 Chemotherapy    Depo-Lupron and Casodex initiated      05/16/2013 -  Chemotherapy    Depo-Lupron monthly continued      05/16/2013 Progression    Progression by PSA elevation      05/16/2013 - 10/22/2014 Chemotherapy    Abiraterone and prednisone initiated in conjunction with ongoing Depo-Lupron.  Denosumab also ongoing.      10/23/2014 Progression    PSA increasing from 0.2- 1.6 in less than 6 months.        10/23/2014 - 01/30/2015 Chemotherapy    Enzalutamide and Prednisone (5 mg in AM and 2.5 mg in PM)      01/30/2015 Imaging    Bone scan- New focus of intense activity in right proximal humerus.  Interim increase in activity over left hip.      01/30/2015 Progression    Bone scan reveals new disease in right humerus consistent with progression of disease      01/31/2015 Imaging    Right humerus xray- blastic foci in proximal right humeral metaphysis and over right mid-humeral diaphysis.  No evidence of fracture      07/06/2015 Progression    Progression in multiple bones especially L hip and femurs      07/06/2015 Imaging    Bone scan- progressive multifocal osseous metastases in the right proximal femora and distal femoral shafts.  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 Tammi Klippel      11/23/2015 - 01/04/2016 Chemotherapy    The patient had pegfilgrastim (NEULASTA  ONPRO KIT) injection 6 mg, 6 mg, Subcutaneous, Once, 3 of 7 cycles  DOCEtaxel (TAXOTERE) 180 mg in dextrose 5 % 250 mL chemo infusion, 75 mg/m2 = 180 mg, Intravenous,  Once, 3 of 7 cycles Dose modification: 64 mg/m2 (original dose 75 mg/m2, Cycle 2, Reason: Dose not tolerated)  pegfilgrastim (NEULASTA ONPRO KIT) injection 6 mg, 6 mg, Subcutaneous, Once, 0 of 4 cycles  cabazitaxel (JEVTANA) 60 mg in dextrose 5 % 250 mL chemo infusion, 25 mg/m2, Intravenous,  Once, 0 of 4 cycles  for chemotherapy treatment.        11/30/2015 Adverse Reaction    Diarrhea (secondary to chemotherapy) and dehydration requiring IV fluids      12/14/2015 Treatment Plan Change    Docetaxel dose reduced by 15%      12/31/2015 Procedure    Port placed by Dr. Arnoldo Morale      01/28/2016 -  Chemotherapy    Cabazitaxel (Jevtana)       03/27/2016 Imaging    CT Chest, Abdomen, and Pelvis with contrast 1. Diffuse sclerotic osseous metastatic disease in the chest, abdomen, and pelvis without acute fracture identified. 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 These appear chronic. 2. The prostate gland is normal in size and no adenopathy  is currently identified. 3. On a prior MRI of 10/04/2012, there was a posterior right hepatic lobe lesion. This lesion is not currently visible on today' s CT. This could be due to differences in cons acuity between CT or MRI, or resolution of the lesion. 4. Coronary, aortic arch, and branch vessel atherosclerotic vascular disease. Aortoiliac atherosclerotic vascular disease. 5. Single bilateral renal cysts.      04/16/2016 Imaging    Bone scan- Findings consist with progressive metastatic disease. Activity over the proximal right humerus and proximal bilateral femurs are worrisome for the possible development of pathologic fractures.      08/21/2016 Imaging    CT C/A/P: IMPRESSION: No significant change since 03/27/2016 CT. Diffuse bony  metastases without other evidence of metastatic disease.      08/21/2016 Imaging    Bone Scan: IMPRESSION: Multiple areas of increased activity again noted throughout the axial and appendicular skeleton in similar locations as prior exam. Intensity of uptake is increased from prior exam suggesting progressive disease. Lesions present in the proximal humeri, proximal femurs, and the mid right femur susceptible to pathologic fracture.      12/02/2016 Imaging    CT C/A/P: IMPRESSION: 1. Overall stable appearance of diffuse osseous metastatic disease and resulting patchy sclerosis. 2. The patient had a posterior right hepatic lobe lesion on prior MRI from 2014 which is been relatively occult on CT. Given the lack of progression I suspect that this is benign or has been effectively treated. 3. Aortic Atherosclerosis (ICD10-I70.0). Coronary atherosclerosis with mild cardiomegaly. 4. Cholelithiasis. 5. Bilateral benign renal cysts. 6. 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      12/02/2016 Imaging    Bone Scan: IMPRESSION: 1. Widespread osseous metastatic disease. Multiplicity is similar to previous exam with interval increase in degree of tracer uptake associated with multiple lesions.      03/11/2017 Imaging    Bone Scan: Multifocal skeletal disease without sign of progression CT C/A/P: Diffuse sclerotic skeletal lesions, unchanged from the previous.  No new lymphadenopathy, pulmonary nodules, or hepatic lesions to suggest soft tissue progressive disease      06/18/2017 -  Chemotherapy    The patient had pegfilgrastim (NEULASTA ONPRO KIT) injection 6 mg, 6 mg, Subcutaneous, Once, 2 of 4 cycles Administration: 6 mg (06/25/2017) DOCEtaxel (TAXOTERE) 130 mg in sodium chloride 0.9 % 250 mL chemo infusion, 60  mg/m2 = 130 mg (80 % of original dose 75 mg/m2), Intravenous,  Once, 2 of 4 cycles Dose modification: 60 mg/m2 (80 % of original dose 75 mg/m2, Cycle 1, Reason: Provider Judgment) Administration: 130 mg (06/25/2017) ondansetron (ZOFRAN) 8 mg in sodium chloride 0.9 % 50 mL IVPB, , Intravenous,  Once, 2 of 4 cycles  for chemotherapy treatment.         CANCER STAGING: Cancer Staging Prostate cancer metastatic to bone [facility]) Staging form: Prostate, AJCC 7th Edition - Clinical: No stage assigned - Unsigned    INTERVAL HISTORY:  Micheal Davis 72 y.o. male returns for follow-up and consideration for next cycle of Docetaxel.   Overall, he feels like he tolerated cycle #1 of Docetaxel well. His biggest complaint is body aches/pains.  This was worse about 4-7 days after chemo. The pain is his hips and back have gotten a little worse since starting chemo.  "One day the pain is good, another day the  pain is bad. But it doesn't keep me in the bed or anything."  He is requesting refill of his short-acting pain medication (MSIR). Remains on MS Contin Q12H.  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  He thinks the gabapentin is helping "some"; neuropathy hasn't gotten worse since starting chemotherapy.    Appetite is okay, but he "has no taste."  He feels like his weight is staying stable.   Overall, he feels well and ready for next cycle of chemo.  Recommended he try Claritin for Neulasta-associated bone pain; he thinks he takes the generic loratadine at home for this, but he is not sure. Encouraged him to check his pill bottles and start the loratadine if he is not already taking it.      REVIEW OF SYSTEMS:  Review of Systems  Constitutional: Positive for fatigue.  Musculoskeletal: Positive for arthralgias and myalgias.  Neurological: Positive for numbness.  Hematological: Bruises/bleeds easily.  All other systems reviewed and are negative.    PAST MEDICAL/SURGICAL  HISTORY:  Past Medical History:  Diagnosis Date  . Diabetes mellitus without complication ([place])   . Hypertension   . Prostate cancer ([place]) 09/05/2015  . Prostate cancer metastatic to bone ([place]) 09/05/2015  . Sleep apnea   . Thyroid disease    Past Surgical History:  Procedure Laterality Date  . CATARACT EXTRACTION    . PORTACATH PLACEMENT Left 12/31/2015   Procedure: INSERTION PORT-A-CATH LEFT SUBCLAVIAN;  Surgeon: Aviva Signs, MD;  Location: [facility];  Service: General;  Laterality: Left;  . REPLACEMENT TOTAL KNEE Left      SOCIAL HISTORY:  Social History   Socioeconomic History  . Marital status: Married    Spouse name: Not on file  . Number of children: Not on file  . Years of education: Not on file  . Highest education level: Not on file  Occupational History  . Not on file  Social Needs  . Financial resource strain: Not on file  . Food insecurity:    Worry: Not on file    Inability: Not on file  . Transportation needs:    Medical: Not on file    Non-medical: Not on file  Tobacco Use  . Smoking status: Never Smoker  . Smokeless tobacco: Never Used  Substance and Sexual Activity  . Alcohol use: Yes    Comment: 1 beer each month  . Drug use: No  . Sexual activity: Never    Comment: married  Lifestyle  . Physical activity:    Days per week: Not on file    Minutes per session: Not on file  . Stress: Not on file  Relationships  . Social connections:    Talks on phone: Not on file    Gets together: Not on file    Attends religious service: Not on file    Active member of club or organization: Not on file    Attends meetings of clubs or organizations: Not on file    Relationship status: Not on file  . Intimate partner violence:    Fear of current or ex partner: Not on file    Emotionally abused: Not on file    Physically abused: Not on file    Forced sexual activity: Not on file  Other Topics Concern  . Not on file  Social History Narrative  . Not on file      FAMILY HISTORY:  History reviewed. No pertinent family history.  CURRENT MEDICATIONS:  Outpatient Encounter  Medications as of 07/16/2017  Medication Sig Note  . atenolol (TENORMIN) 100 MG tablet Take 100 mg by mouth daily.   Marland Kitchen atorvastatin (LIPITOR) 20 MG tablet Take 10 mg by mouth daily.   . calcium carbonate (OS-CAL - DOSED IN MG OF ELEMENTAL CALCIUM) 1250 (500 Ca) MG tablet Take 1 tablet by mouth 2 (two) times daily.    Marland Kitchen denosumab (XGEVA) 120 MG/1.7ML SOLN injection Inject 120 mg into the skin every 28 (twenty-eight) days. 05/18/2017: On hold per MD  . diltiazem (CARDIZEM CD) 300 MG 24 hr capsule Take 300 mg by mouth daily.   . DOCEtaxel (TAXOTERE IV) Inject into the vein.   Marland Kitchen gabapentin (NEURONTIN) 300 MG capsule Patient may take 1 capsule in the morning and 2 capsules @ bedtime.   . Glucosamine-Chondroit-Vit C-Mn (GLUCOSAMINE 1500 COMPLEX PO) Take 1 tablet by mouth 2 (two) times daily.    Marland Kitchen KLOR-CON M20 20 MEQ tablet TAKE 1 TABLET BY MOUTH ONCE DAILY (Patient taking differently: TAKE 20 MEQ BY MOUTH ONCE DAILY)   . leuprolide (LUPRON) 7.5 MG injection Inject 7.5 mg into the muscle every 28 (twenty-eight) days.   Marland Kitchen levothyroxine (SYNTHROID, LEVOTHROID) 25 MCG tablet Take 25 mcg by mouth daily before breakfast.   . loperamide (IMODIUM) 1 MG/5ML solution Take 1 mg by mouth as needed for diarrhea or loose stools.   . Lysine 500 MG CAPS Take 500 mg by mouth daily.   . magic mouthwash w/lidocaine SOLN 1 part of each of the following: Benadryl 12.33m /531m Viscous lidocaine 2%, Maalox. Swish and swallow 5 mL QID.   . Marland KitchenetFORMIN (GLUCOPHAGE) 500 MG tablet Take 500 mg by mouth 2 (two) times daily with a meal.   . morphine (MS CONTIN) 60 MG 12 hr tablet Take 1 tablet (60 mg total) by mouth every 12 (twelve) hours.   . Marland Kitchenorphine (MSIR) 30 MG tablet Take 1 tablet (30 mg total) by mouth every 4 (four) hours as needed for severe pain.   . Multiple Vitamin (MULTIVITAMIN WITH MINERALS) TABS tablet  Take 1 tablet by mouth daily.   . Omega-3 Fatty Acids (FISH OIL PO) Take 1 capsule by mouth daily.   . ondansetron (ZOFRAN-ODT) 8 MG disintegrating tablet Take 1 tablet (8 mg total) by mouth every 8 (eight) hours as needed for nausea or vomiting.   . pegfilgrastim-cbqv (UDENYCA) 6 MG/0.6ML injection Inject 6 mg into the skin once.   . predniSONE (DELTASONE) 5 MG tablet Take 5 mg by mouth 2 (two) times daily with a meal.   . tamsulosin (FLOMAX) 0.4 MG CAPS capsule Take 0.8 mg by mouth daily.    . Marland Kitchenriamterene-hydrochlorothiazide (MAXZIDE-25) 37.5-25 MG tablet Take 1 tablet by mouth daily. for high blood pressure   . [DISCONTINUED] prochlorperazine (COMPAZINE) 10 MG tablet Take 1 tablet (10 mg total) by mouth every 6 (six) hours as needed (Nausea or vomiting). (Patient taking differently: Take 10 mg by mouth every 6 (six) hours as needed for nausea or vomiting. )    Facility-Administered Encounter Medications as of 07/16/2017  Medication  . leuprolide (LUPRON) injection 7.5 mg    ALLERGIES:  No Known Allergies   PHYSICAL EXAM:  ECOG Performance status: 1  I have reviewed the vitals. Physical Exam No skin rashes.  No lower extremity edema.  LABORATORY DATA:  I have reviewed the labs as listed.  CBC    Component Value Date/Time   WBC 8.1 07/16/2017 0833   RBC 3.89 (L) 07/16/2017 0833   HGB  10.9 (L) 07/16/2017 0833   HCT 34.7 (L) 07/16/2017 0833   PLT 265 07/16/2017 0833   MCV 89.2 07/16/2017 0833   MCH 28.0 07/16/2017 0833   MCHC 31.4 07/16/2017 0833   RDW 17.4 (H) 07/16/2017 0833   LYMPHSABS 1.2 07/16/2017 0833   MONOABS 0.7 07/16/2017 0833   EOSABS 0.0 07/16/2017 0833   BASOSABS 0.1 07/16/2017 0833   CMP Latest Ref Rng & Units 07/16/2017 06/25/2017 05/01/2017  Glucose 65 - 99 mg/dL 164(H) 188(H) 165(H)  BUN 6 - 20 mg/dL _0 Creatinine 0.61 - 1.24 mg/dL 0.82 0.74 0.82  Sodium 135 - 145 mmol/L 136 141 126(L)  Potassium 3.5 - 5.1 mmol/L 3.4(L) 4.4 3.7  Chloride 101 - 111  mmol/L 100(L) 101 88(L)  CO2 22 - 32 mmol/L _1 Calcium 8.9 - 10.3 mg/dL 8.6(L) 9.4 9.0  Total Protein 6.5 - 8.1 g/dL 6.6 6.8 -  Total Bilirubin 0.3 - 1.2 mg/dL 0.7 0.6 -  Alkaline Phos 38 - 126 U/L 205(H) 227(H) -  AST 15 - 41 U/L 43(H) 18 -  ALT 17 - 63 U/L 15(L) 14(L) -       ASSESSMENT & PLAN:   Prostate cancer metastatic to bone ([place]) 1.  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 - BRCA 1/2 negative, PDL 1 expression of 0% -Foundation 1 testing shows MS-stable, TMB intermediate, no other significant alterations to modify therapy. -Most recent progression on cabazitaxel.  He has used [place] in the past.  He had received 2 cycles of docetaxel in the beginning.  It is unclear whether this was discontinued secondary to progression of disease.   -Cycle 1 of docetaxel at 20% dose reduction started on 06/25/2017.  He has tolerated his first cycle fairly well.  He had some body pains in the back and hips which started 1 week after his last treatment.  Less likely from docetaxel, more likely from Neulasta.  He did not require any extra pain medication.  Today his blood counts are adequate to proceed with cycle 2 without any further dose modifications.  I will see him back in 3 weeks for follow-up.  We plan to repeat scans after cycle 3.  We will also repeat PSA at that time.  2.  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  This is from prior chemotherapy.  He takes gabapentin 1 tablet in the morning and 2 tablets at bedtime.  This has not gotten worse after start of docetaxel.     Orders placed this encounter:  No orders of the defined types were placed in this encounter.   This note includes documentation from Mike Craze, NP, who was present during this patient's office visit and evaluation.  I have reviewed this note for its completeness and accuracy.  I have edited  this note accordingly based on my findings and medical opinion.       Derek Jack, MD [place] [phone]

## 2017-07-16 NOTE — Progress Notes (Signed)
Labs reviewed with MD, patient seen by Dr. [name] today. Proceed with treatment.  Treatment given per orders. Patient tolerated it well without problems. Vitals stable and discharged home from clinic ambulatory. Follow up as scheduled.

## 2017-07-16 NOTE — Addendum Note (Signed)
Addended by: Derek Jack on: 07/16/2017 10:28 AM   Modules accepted: Orders

## 2017-07-17 ENCOUNTER — Ambulatory Visit (HOSPITAL_COMMUNITY): Payer: Medicare Other

## 2017-07-23 ENCOUNTER — Ambulatory Visit (HOSPITAL_COMMUNITY): Payer: Medicare Other

## 2017-07-23 ENCOUNTER — Other Ambulatory Visit (HOSPITAL_COMMUNITY): Payer: Medicare Other

## 2017-07-28 ENCOUNTER — Inpatient Hospital Stay (HOSPITAL_COMMUNITY): Payer: Medicare Other

## 2017-07-28 ENCOUNTER — Encounter (HOSPITAL_COMMUNITY): Payer: Self-pay

## 2017-07-28 ENCOUNTER — Other Ambulatory Visit: Payer: Self-pay

## 2017-07-28 VITALS — BP 140/66 | HR 56 | Temp 98.7°F | Resp 18

## 2017-07-28 DIAGNOSIS — C61 Malignant neoplasm of prostate: Secondary | ICD-10-CM

## 2017-07-28 DIAGNOSIS — Z5189 Encounter for other specified aftercare: Secondary | ICD-10-CM | POA: Diagnosis not present

## 2017-07-28 DIAGNOSIS — C7951 Secondary malignant neoplasm of bone: Principal | ICD-10-CM

## 2017-07-28 DIAGNOSIS — Z5111 Encounter for antineoplastic chemotherapy: Secondary | ICD-10-CM | POA: Diagnosis not present

## 2017-07-28 DIAGNOSIS — E119 Type 2 diabetes mellitus without complications: Secondary | ICD-10-CM | POA: Diagnosis not present

## 2017-07-28 DIAGNOSIS — R52 Pain, unspecified: Secondary | ICD-10-CM | POA: Diagnosis not present

## 2017-07-28 LAB — COMPREHENSIVE METABOLIC PANEL
ALT: 17 U/L (ref 17–63)
ANION GAP: 12 (ref 5–15)
AST: 23 U/L (ref 15–41)
Albumin: 3.3 g/dL — ABNORMAL LOW (ref 3.5–5.0)
Alkaline Phosphatase: 156 U/L — ABNORMAL HIGH (ref 38–126)
BUN: 16 mg/dL (ref 6–20)
CHLORIDE: 104 mmol/L (ref 101–111)
CO2: 24 mmol/L (ref 22–32)
Calcium: 8.6 mg/dL — ABNORMAL LOW (ref 8.9–10.3)
Creatinine, Ser: 0.68 mg/dL (ref 0.61–1.24)
GFR calc non Af Amer: >60 mL/min (ref >60)
Glucose, Bld: 176 mg/dL — ABNORMAL HIGH (ref 65–99)
POTASSIUM: 3.6 mmol/L (ref 3.5–5.1)
Sodium: 140 mmol/L (ref 135–145)
Total Bilirubin: 0.6 mg/dL (ref 0.3–1.2)
Total Protein: 5.8 g/dL — ABNORMAL LOW (ref 6.5–8.1)

## 2017-07-28 MED ORDER — LEUPROLIDE ACETATE 7.5 MG IM KIT
7.5000 mg | PACK | INTRAMUSCULAR | Status: DC
  Administered 2017-07-28: 7.5 mg via INTRAMUSCULAR
  Filled 2017-07-28: qty 7.5

## 2017-07-28 MED ORDER — DENOSUMAB 120 MG/1.7ML ~~LOC~~ SOLN
120.0000 mg | Freq: Once | SUBCUTANEOUS | Status: AC
  Administered 2017-07-28: 120 mg via SUBCUTANEOUS
  Filled 2017-07-28: qty 1.7

## 2017-07-28 NOTE — Patient Instructions (Signed)
[place] at [facility] Discharge Instructions  xgeva and lupron given today. Follow up as scheduled.   Thank you for choosing [place] at [facility] to provide your oncology and hematology care.  To afford each patient quality time with our provider, please arrive at least 15 minutes before your scheduled appointment time.   If you have a lab appointment with the [facility] please come in thru the  Main Entrance and check in at the main information desk  You need to re-schedule your appointment should you arrive 10 or more minutes late.  We strive to give you quality time with our providers, and arriving late affects you and other patients whose appointments are after yours.  Also, if you no show three or more times for appointments you may be dismissed from the clinic at the providers discretion.     Again, thank you for choosing [facility].  Our hope is that these requests will decrease the amount of time that you wait before being seen by our physicians.       _____________________________________________________________  Should you have questions after your visit to [facility], please contact our office at [phone] between the hours of 8:30 a.m. and 4:30 p.m.  Voicemails left after 4:30 p.m. will not be returned until the following business day.  For prescription refill requests, have your pharmacy contact our office.       Resources For Cancer Patients and their Caregivers ? American Cancer Society: Can assist with transportation, wigs, general needs, runs Look Good Feel Better.        ([phone] 482-0599 ? Cancer Care: Provides financial assistance, online support groups, medication/co-pay assistance.  1-800-813-HOPE (709)201-9032) ? [place] Assists [place] cancer patients and their families through emotional , educational and financial support.  (719) 491-5004 ? [place] DSS Where to apply for food stamps, Medicaid and utility assistance. (614)197-5166 ? RCATS: Transportation to medical appointments. 705-591-6322 ? Social Security Administration: May apply for disability if have a Stage IV cancer. ([phone] 210-647-6446 ? [employer], Disability and Transit Services: Assists with nutrition, care and transit needs. [place] Support Programs:   > Cancer Support Group  2nd Tuesday of the month 1pm-2pm, Journey Room   > Creative Journey  3rd Tuesday of the month 1130am-1pm, Journey Room

## 2017-07-28 NOTE — Progress Notes (Signed)
Micheal Davis presents today for injection per MD orders. Lupron 7.5 mg administered IM  in right upper outer buttocks. Administration without incident. Patient tolerated well.  Micheal Davis presents today for injection per MD orders. Xgeva 120 mg administered SQ in right Abdomen. Administration without incident. Patient tolerated well.   Vitals stable and discharged home from clinic ambulatory. Follow up as scheduled.         Corrected calcium 9.2.

## 2017-08-06 ENCOUNTER — Encounter (HOSPITAL_COMMUNITY): Payer: Self-pay | Admitting: Hematology

## 2017-08-06 ENCOUNTER — Inpatient Hospital Stay (HOSPITAL_COMMUNITY): Payer: Medicare Other | Attending: Hematology

## 2017-08-06 ENCOUNTER — Other Ambulatory Visit: Payer: Self-pay

## 2017-08-06 ENCOUNTER — Inpatient Hospital Stay (HOSPITAL_BASED_OUTPATIENT_CLINIC_OR_DEPARTMENT_OTHER): Payer: Medicare Other | Admitting: Hematology

## 2017-08-06 ENCOUNTER — Inpatient Hospital Stay (HOSPITAL_COMMUNITY): Payer: Medicare Other

## 2017-08-06 VITALS — BP 118/65 | HR 47 | Temp 97.5°F | Resp 18 | Wt 227.0 lb

## 2017-08-06 DIAGNOSIS — C7951 Secondary malignant neoplasm of bone: Principal | ICD-10-CM

## 2017-08-06 DIAGNOSIS — Z79899 Other long term (current) drug therapy: Secondary | ICD-10-CM | POA: Diagnosis not present

## 2017-08-06 DIAGNOSIS — G620 Drug-induced polyneuropathy: Secondary | ICD-10-CM | POA: Diagnosis not present

## 2017-08-06 DIAGNOSIS — Z5111 Encounter for antineoplastic chemotherapy: Secondary | ICD-10-CM | POA: Diagnosis not present

## 2017-08-06 DIAGNOSIS — E119 Type 2 diabetes mellitus without complications: Secondary | ICD-10-CM | POA: Diagnosis not present

## 2017-08-06 DIAGNOSIS — G893 Neoplasm related pain (acute) (chronic): Secondary | ICD-10-CM | POA: Diagnosis not present

## 2017-08-06 DIAGNOSIS — Z7984 Long term (current) use of oral hypoglycemic drugs: Secondary | ICD-10-CM | POA: Insufficient documentation

## 2017-08-06 DIAGNOSIS — I10 Essential (primary) hypertension: Secondary | ICD-10-CM | POA: Insufficient documentation

## 2017-08-06 DIAGNOSIS — D6481 Anemia due to antineoplastic chemotherapy: Secondary | ICD-10-CM

## 2017-08-06 DIAGNOSIS — C61 Malignant neoplasm of prostate: Secondary | ICD-10-CM

## 2017-08-06 DIAGNOSIS — C787 Secondary malignant neoplasm of liver and intrahepatic bile duct: Secondary | ICD-10-CM

## 2017-08-06 DIAGNOSIS — Z923 Personal history of irradiation: Secondary | ICD-10-CM | POA: Insufficient documentation

## 2017-08-06 LAB — CBC WITH DIFFERENTIAL/PLATELET
BASOS ABS: 0 10*3/uL (ref 0.0–0.1)
BASOS PCT: 1 %
EOS ABS: 0 10*3/uL (ref 0.0–0.7)
Eosinophils Relative: 0 %
HEMATOCRIT: 31.1 % — AB (ref 39.0–52.0)
HEMOGLOBIN: 9.8 g/dL — AB (ref 13.0–17.0)
Lymphocytes Relative: 13 %
Lymphs Abs: 0.9 10*3/uL (ref 0.7–4.0)
MCH: 28.4 pg (ref 26.0–34.0)
MCHC: 31.5 g/dL (ref 30.0–36.0)
MCV: 90.1 fL (ref 78.0–100.0)
MONOS PCT: 6 %
Monocytes Absolute: 0.4 10*3/uL (ref 0.1–1.0)
NEUTROS PCT: 80 %
Neutro Abs: 5.3 10*3/uL (ref 1.7–7.7)
Platelets: 155 10*3/uL (ref 150–400)
RBC: 3.45 MIL/uL — AB (ref 4.22–5.81)
RDW: 18.8 % — ABNORMAL HIGH (ref 11.5–15.5)
WBC: 6.6 10*3/uL (ref 4.0–10.5)

## 2017-08-06 LAB — COMPREHENSIVE METABOLIC PANEL
ALBUMIN: 3.7 g/dL (ref 3.5–5.0)
ALK PHOS: 169 U/L — AB (ref 38–126)
ALT: 16 U/L — ABNORMAL LOW (ref 17–63)
ANION GAP: 8 (ref 5–15)
AST: 43 U/L — AB (ref 15–41)
BUN: 16 mg/dL (ref 6–20)
CALCIUM: 8.9 mg/dL (ref 8.9–10.3)
CO2: 25 mmol/L (ref 22–32)
Chloride: 105 mmol/L (ref 101–111)
Creatinine, Ser: 0.79 mg/dL (ref 0.61–1.24)
GFR calc Af Amer: >60 mL/min (ref >60)
GFR calc non Af Amer: >60 mL/min (ref >60)
GLUCOSE: 182 mg/dL — AB (ref 65–99)
Potassium: 3.7 mmol/L (ref 3.5–5.1)
SODIUM: 138 mmol/L (ref 135–145)
Total Bilirubin: 0.9 mg/dL (ref 0.3–1.2)
Total Protein: 6.2 g/dL — ABNORMAL LOW (ref 6.5–8.1)

## 2017-08-06 MED ORDER — HEPARIN SOD (PORK) LOCK FLUSH 100 UNIT/ML IV SOLN
500.0000 [IU] | Freq: Once | INTRAVENOUS | Status: AC | PRN
  Administered 2017-08-06: 500 [IU]
  Filled 2017-08-06: qty 5

## 2017-08-06 MED ORDER — SODIUM CHLORIDE 0.9 % IV SOLN
Freq: Once | INTRAVENOUS | Status: AC
  Administered 2017-08-06: 10:00:00 via INTRAVENOUS

## 2017-08-06 MED ORDER — DIPHENHYDRAMINE HCL 50 MG/ML IJ SOLN
25.0000 mg | Freq: Once | INTRAMUSCULAR | Status: AC
  Administered 2017-08-06: 25 mg via INTRAVENOUS
  Filled 2017-08-06: qty 1

## 2017-08-06 MED ORDER — PEGFILGRASTIM 6 MG/0.6ML ~~LOC~~ PSKT
6.0000 mg | PREFILLED_SYRINGE | Freq: Once | SUBCUTANEOUS | Status: AC
  Administered 2017-08-06: 6 mg via SUBCUTANEOUS
  Filled 2017-08-06: qty 0.6

## 2017-08-06 MED ORDER — DEXAMETHASONE SODIUM PHOSPHATE 10 MG/ML IJ SOLN
10.0000 mg | Freq: Once | INTRAMUSCULAR | Status: AC
  Administered 2017-08-06: 10 mg via INTRAVENOUS
  Filled 2017-08-06: qty 1

## 2017-08-06 MED ORDER — SODIUM CHLORIDE 0.9 % IV SOLN: Freq: Once | INTRAVENOUS | Status: DC

## 2017-08-06 MED ORDER — ONDANSETRON 8 MG PO TBDP
8.0000 mg | ORAL_TABLET | Freq: Once | ORAL | Status: AC
  Administered 2017-08-06: 8 mg via ORAL
  Filled 2017-08-06: qty 1

## 2017-08-06 MED ORDER — SODIUM CHLORIDE 0.9% FLUSH
10.0000 mL | INTRAVENOUS | Status: DC | PRN
  Administered 2017-08-06: 10 mL
  Filled 2017-08-06: qty 10

## 2017-08-06 MED ORDER — SODIUM CHLORIDE 0.9 % IV SOLN: 10.0000 mg | Freq: Once | INTRAVENOUS | Status: DC

## 2017-08-06 MED ORDER — FERUMOXYTOL INJECTION 510 MG/17 ML
510.0000 mg | Freq: Once | INTRAVENOUS | Status: AC
  Administered 2017-08-06: 510 mg via INTRAVENOUS
  Filled 2017-08-06: qty 17

## 2017-08-06 MED ORDER — SODIUM CHLORIDE 0.9 % IV SOLN
60.0000 mg/m2 | Freq: Once | INTRAVENOUS | Status: AC
  Administered 2017-08-06: 130 mg via INTRAVENOUS
  Filled 2017-08-06: qty 13

## 2017-08-06 NOTE — Patient Instructions (Addendum)
[facility] Discharge Instructions for Patients Receiving Chemotherapy   Beginning January 23rd 2017 lab work for the [facility] will be done in the  Main lab at [facility] on 1st floor. If you have a lab appointment with the [facility] [facility] please come in thru the  Main Entrance and check in at the main information desk   Today you received the following chemotherapy agents Taxotere as well as Neulasta on-pro and Feraheme infusion Follow-up as scheduled. Call clinic for any questions or concerns  To help prevent nausea and vomiting after your treatment, we encourage you to take your nausea medication   If you develop nausea and vomiting, or diarrhea that is not controlled by your medication, call the clinic.  The clinic phone number is [phone]. Office hours are Monday-Friday 8:30am-5:00pm.  BELOW ARE SYMPTOMS THAT SHOULD BE REPORTED IMMEDIATELY:  *FEVER GREATER THAN 101.0 F  *CHILLS WITH OR WITHOUT FEVER  NAUSEA AND VOMITING THAT IS NOT CONTROLLED WITH YOUR NAUSEA MEDICATION  *UNUSUAL SHORTNESS OF BREATH  *UNUSUAL BRUISING OR BLEEDING  TENDERNESS IN MOUTH AND THROAT WITH OR WITHOUT PRESENCE OF ULCERS  *URINARY PROBLEMS  *BOWEL PROBLEMS  UNUSUAL RASH Items with * indicate a potential emergency and should be followed up as soon as possible. If you have an emergency after office hours please contact your primary care physician or go to the nearest emergency department.  Please call the clinic during office hours if you have any questions or concerns.   You may also contact the Patient Navigator at [phone] should you have any questions or need assistance in obtaining follow up care.      Resources For Cancer Patients and their Caregivers ? American Cancer Society: Can assist with transportation, wigs, general needs, runs Look Good Feel Better.        ([phone] ? Cancer Care: Provides financial assistance, online support groups,  medication/co-pay assistance.  1-800-813-HOPE (949)210-7202) ? [place] Assists [place] cancer patients and their families through emotional , educational and financial support.  878-211-3623 ? [place] DSS Where to apply for food stamps, Medicaid and utility assistance. 848 356 6033 ? [facility]: Transportation to medical appointments. 984-626-9588 ? Social Security Administration: May apply for disability if have a Stage IV cancer. 613-556-0230 3307099623 ? [employer], Disability and Transit Services: Assists with nutrition, care and transit needs. [phone]

## 2017-08-06 NOTE — Progress Notes (Signed)
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  CURRENT THERAPY: Docetaxel every 3 weeks.  BRIEF ONCOLOGIC HISTORY:    Prostate cancer metastatic to bone ([place])   10/01/2012 Initial Diagnosis    Prostate biopsied with highest Gleason score of 9 seen and the lowest score was 7.      10/04/2012 - 05/16/2013 Chemotherapy    Depo-Lupron and Casodex initiated      05/16/2013 -  Chemotherapy    Depo-Lupron monthly continued      05/16/2013 Progression    Progression by PSA elevation      05/16/2013 - 10/22/2014 Chemotherapy    Abiraterone and prednisone initiated in conjunction with ongoing Depo-Lupron.  Denosumab also ongoing.      10/23/2014 Progression    PSA increasing from 0.2- 1.6 in less than 6 months.        10/23/2014 - 01/30/2015 Chemotherapy    Enzalutamide and Prednisone (5 mg in AM and 2.5 mg in PM)      01/30/2015 Imaging    Bone scan- New focus of intense activity in right proximal humerus.  Interim increase in activity over left hip.      01/30/2015 Progression    Bone scan reveals new disease in right humerus consistent with progression of disease      01/31/2015 Imaging    Right humerus xray- blastic foci in proximal right humeral metaphysis and over right mid-humeral diaphysis.  No evidence of fracture      07/06/2015 Progression    Progression in multiple bones especially L hip and femurs      07/06/2015 Imaging    Bone scan- progressive multifocal osseous metastases in the right proximal femora and distal femoral shafts.  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 Tammi Klippel      11/23/2015 - 01/04/2016 Chemotherapy    The  patient had pegfilgrastim (NEULASTA ONPRO KIT) injection 6 mg, 6 mg, Subcutaneous, Once, 3 of 7 cycles  DOCEtaxel (TAXOTERE) 180 mg in dextrose 5 % 250 mL chemo infusion, 75 mg/m2 = 180 mg, Intravenous,  Once, 3 of 7 cycles Dose modification: 64 mg/m2 (original dose 75 mg/m2, Cycle 2, Reason: Dose not tolerated)  pegfilgrastim (NEULASTA ONPRO KIT) injection 6 mg, 6 mg, Subcutaneous, Once, 0 of 4 cycles  cabazitaxel (JEVTANA) 60 mg in dextrose 5 % 250 mL chemo infusion, 25 mg/m2, Intravenous,  Once, 0 of 4 cycles  for chemotherapy treatment.        11/30/2015 Adverse Reaction    Diarrhea (secondary to chemotherapy) and dehydration requiring IV fluids      12/14/2015 Treatment Plan Change    Docetaxel dose reduced by 15%      12/31/2015 Procedure    Port placed by Dr. Arnoldo Morale      01/28/2016 -  Chemotherapy    Cabazitaxel (Jevtana)       03/27/2016 Imaging    CT Chest, Abdomen, and Pelvis with contrast 1. Diffuse sclerotic osseous metastatic disease in the chest, abdomen, and pelvis without acute fracture identified. 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 These appear chronic. 2. The prostate gland  is normal in size and no adenopathy is currently identified. 3. On a prior MRI of 10/04/2012, there was a posterior right hepatic lobe lesion. This lesion is not currently visible on today' s CT. This could be due to differences in cons acuity between CT or MRI, or resolution of the lesion. 4. Coronary, aortic arch, and branch vessel atherosclerotic vascular disease. Aortoiliac atherosclerotic vascular disease. 5. Single bilateral renal cysts.      04/16/2016 Imaging    Bone scan- Findings consist with progressive metastatic disease. Activity over the proximal right humerus and proximal bilateral femurs are worrisome for the possible development of pathologic fractures.      08/21/2016 Imaging    CT C/A/P: IMPRESSION: No significant change  since 03/27/2016 CT. Diffuse bony metastases without other evidence of metastatic disease.      08/21/2016 Imaging    Bone Scan: IMPRESSION: Multiple areas of increased activity again noted throughout the axial and appendicular skeleton in similar locations as prior exam. Intensity of uptake is increased from prior exam suggesting progressive disease. Lesions present in the proximal humeri, proximal femurs, and the mid right femur susceptible to pathologic fracture.      12/02/2016 Imaging    CT C/A/P: IMPRESSION: 1. Overall stable appearance of diffuse osseous metastatic disease and resulting patchy sclerosis. 2. The patient had a posterior right hepatic lobe lesion on prior MRI from 2014 which is been relatively occult on CT. Given the lack of progression I suspect that this is benign or has been effectively treated. 3. Aortic Atherosclerosis (ICD10-I70.0). Coronary atherosclerosis with mild cardiomegaly. 4. Cholelithiasis. 5. Bilateral benign renal cysts. 6. 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      12/02/2016 Imaging    Bone Scan: IMPRESSION: 1. Widespread osseous metastatic disease. Multiplicity is similar to previous exam with interval increase in degree of tracer uptake associated with multiple lesions.      03/11/2017 Imaging    Bone Scan: Multifocal skeletal disease without sign of progression CT C/A/P: Diffuse sclerotic skeletal lesions, unchanged from the previous.  No new lymphadenopathy, pulmonary nodules, or hepatic lesions to suggest soft tissue progressive disease      06/18/2017 -  Chemotherapy    The patient had pegfilgrastim (NEULASTA ONPRO KIT) injection 6 mg, 6 mg, Subcutaneous, Once, 3 of 4 cycles Administration: 6 mg (06/25/2017), 6 mg (07/16/2017), 6 mg (08/06/2017) DOCEtaxel  (TAXOTERE) 130 mg in sodium chloride 0.9 % 250 mL chemo infusion, 60 mg/m2 = 130 mg (80 % of original dose 75 mg/m2), Intravenous,  Once, 3 of 4 cycles Dose modification: 60 mg/m2 (80 % of original dose 75 mg/m2, Cycle 1, Reason: Provider Judgment) Administration: 130 mg (06/25/2017), 130 mg (07/16/2017), 130 mg (08/06/2017) ondansetron (ZOFRAN) 8 mg in sodium chloride 0.9 % 50 mL IVPB, , Intravenous,  Once, 3 of 4 cycles  for chemotherapy treatment.         CANCER STAGING: Cancer Staging Prostate cancer metastatic to bone [facility]) Staging form: Prostate, AJCC 7th Edition - Clinical: No stage assigned - Unsigned    INTERVAL HISTORY:  Mr. Zuercher 72 y.o. male returns for follow-up of metastatic prostate cancer and next cycle of chemotherapy.  After last cycle, he developed generalized body aches for 2 days, 1 to 2 days after treatment.  He has been actively working in the garden.  He is taking ibuprofen  pill as needed for body pains.  His neuropathy with numbness in the feet has been stable.  Denies any fevers or infections.  Mild fatigue is also stable.  Denies any hospitalizations.  REVIEW OF SYSTEMS:  Review of Systems  Constitutional: Positive for fatigue.  Cardiovascular: Positive for leg swelling.  Neurological: Positive for dizziness and numbness.  Hematological: Bruises/bleeds easily.  All other systems reviewed and are negative.    PAST MEDICAL/SURGICAL HISTORY:  Past Medical History:  Diagnosis Date  . Diabetes mellitus without complication ([place])   . Hypertension   . Prostate cancer ([place]) 09/05/2015  . Prostate cancer metastatic to bone ([place]) 09/05/2015  . Sleep apnea   . Thyroid disease    Past Surgical History:  Procedure Laterality Date  . CATARACT EXTRACTION    . PORTACATH PLACEMENT Left 12/31/2015   Procedure: INSERTION PORT-A-CATH LEFT SUBCLAVIAN;  Surgeon: Aviva Signs, MD;  Location: [facility];  Service: General;  Laterality: Left;  . REPLACEMENT TOTAL KNEE Left       SOCIAL HISTORY:  Social History   Socioeconomic History  . Marital status: Married    Spouse name: Not on file  . Number of children: Not on file  . Years of education: Not on file  . Highest education level: Not on file  Occupational History  . Not on file  Social Needs  . Financial resource strain: Not on file  . Food insecurity:    Worry: Not on file    Inability: Not on file  . Transportation needs:    Medical: Not on file    Non-medical: Not on file  Tobacco Use  . Smoking status: Never Smoker  . Smokeless tobacco: Never Used  Substance and Sexual Activity  . Alcohol use: Yes    Comment: 1 beer each month  . Drug use: No  . Sexual activity: Never    Comment: married  Lifestyle  . Physical activity:    Days per week: Not on file    Minutes per session: Not on file  . Stress: Not on file  Relationships  . Social connections:    Talks on phone: Not on file    Gets together: Not on file    Attends religious service: Not on file    Active member of club or organization: Not on file    Attends meetings of clubs or organizations: Not on file    Relationship status: Not on file  . Intimate partner violence:    Fear of current or ex partner: Not on file    Emotionally abused: Not on file    Physically abused: Not on file    Forced sexual activity: Not on file  Other Topics Concern  . Not on file  Social History Narrative  . Not on file    FAMILY HISTORY:  History reviewed. No pertinent family history.  CURRENT MEDICATIONS:  Outpatient Encounter Medications as of 08/06/2017  Medication Sig Note  . atenolol (TENORMIN) 100 MG tablet Take 100 mg by mouth daily.   Marland Kitchen atorvastatin (LIPITOR) 20 MG tablet Take 10 mg by mouth daily.   . calcium carbonate (OS-CAL - DOSED IN MG OF ELEMENTAL CALCIUM) 1250 (500 Ca) MG tablet Take 1 tablet by mouth 2 (two) times daily.    Marland Kitchen denosumab (XGEVA) 120 MG/1.7ML SOLN injection Inject 120 mg into the skin every 28  (twenty-eight) days. 05/18/2017: On hold per MD  . diltiazem (CARDIZEM CD) 300 MG 24 hr capsule Take 300 mg by mouth  daily.   . DOCEtaxel (TAXOTERE IV) Inject into the vein.   Marland Kitchen gabapentin (NEURONTIN) 300 MG capsule Patient may take 1 capsule in the morning and 2 capsules @ bedtime.   . Glucosamine-Chondroit-Vit C-Mn (GLUCOSAMINE 1500 COMPLEX PO) Take 1 tablet by mouth 2 (two) times daily.    Marland Kitchen KLOR-CON M20 20 MEQ tablet TAKE 1 TABLET BY MOUTH ONCE DAILY (Patient taking differently: TAKE 20 MEQ BY MOUTH ONCE DAILY)   . leuprolide (LUPRON) 7.5 MG injection Inject 7.5 mg into the muscle every 28 (twenty-eight) days.   Marland Kitchen levothyroxine (SYNTHROID, LEVOTHROID) 25 MCG tablet Take 25 mcg by mouth daily before breakfast.   . loperamide (IMODIUM) 1 MG/5ML solution Take 1 mg by mouth as needed for diarrhea or loose stools.   . Lysine 500 MG CAPS Take 500 mg by mouth daily.   . magic mouthwash w/lidocaine SOLN 1 part of each of the following: Benadryl 12.38m /58m Viscous lidocaine 2%, Maalox. Swish and swallow 5 mL QID.   . Marland KitchenetFORMIN (GLUCOPHAGE) 500 MG tablet Take 500 mg by mouth 2 (two) times daily with a meal.   . morphine (MS CONTIN) 60 MG 12 hr tablet Take 1 tablet (60 mg total) by mouth every 12 (twelve) hours.   . Marland Kitchenorphine (MSIR) 30 MG tablet Take 1 tablet (30 mg total) by mouth every 6 (six) hours as needed for severe pain.   . Multiple Vitamin (MULTIVITAMIN WITH MINERALS) TABS tablet Take 1 tablet by mouth daily.   . Omega-3 Fatty Acids (FISH OIL PO) Take 1 capsule by mouth daily.   . ondansetron (ZOFRAN-ODT) 8 MG disintegrating tablet Take 1 tablet (8 mg total) by mouth every 8 (eight) hours as needed for nausea or vomiting.   . pegfilgrastim-cbqv (UDENYCA) 6 MG/0.6ML injection Inject 6 mg into the skin once.   . predniSONE (DELTASONE) 5 MG tablet Take 5 mg by mouth 2 (two) times daily with a meal.   . tamsulosin (FLOMAX) 0.4 MG CAPS capsule Take 0.8 mg by mouth daily.    . Marland Kitchentriamterene-hydrochlorothiazide (MAXZIDE-25) 37.5-25 MG tablet Take 1 tablet by mouth daily. for high blood pressure   . [DISCONTINUED] prochlorperazine (COMPAZINE) 10 MG tablet Take 1 tablet (10 mg total) by mouth every 6 (six) hours as needed (Nausea or vomiting). (Patient taking differently: Take 10 mg by mouth every 6 (six) hours as needed for nausea or vomiting. )    Facility-Administered Encounter Medications as of 08/06/2017  Medication  . leuprolide (LUPRON) injection 7.5 mg    ALLERGIES:  No Known Allergies   PHYSICAL EXAM:  ECOG Performance status: 1  I have reviewed his vitals.  Blood pressure 137/74, pulse rate is 58, respiratory rate is 20, temperature is 98. Physical Exam  HEENT: No mucositis or thrush. CVS: S1-S2 regular rate and rhythm. Chest: Bilateral clear to auscultation. Abdomen: Soft nontender with no palpable organomegaly. Extremities: No edema or cyanosis. LABORATORY DATA:  I have reviewed the labs as listed.  CBC    Component Value Date/Time   WBC 6.6 08/06/2017 0907   RBC 3.45 (L) 08/06/2017 0907   HGB 9.8 (L) 08/06/2017 0907   HCT 31.1 (L) 08/06/2017 0907   PLT 155 08/06/2017 0907   MCV 90.1 08/06/2017 0907   MCH 28.4 08/06/2017 0907   MCHC 31.5 08/06/2017 0907   RDW 18.8 (H) 08/06/2017 0907   LYMPHSABS 0.9 08/06/2017 0907   MONOABS 0.4 08/06/2017 0907   EOSABS 0.0 08/06/2017 0907   BASOSABS 0.0 08/06/2017 [id number]  CMP Latest Ref Rng & Units 08/06/2017 07/28/2017 07/16/2017  Glucose 65 - 99 mg/dL 182(H) 176(H) 164(H)  BUN 6 - 20 mg/dL '16 16 19  ' Creatinine 0.61 - 1.24 mg/dL 0.79 0.68 0.82  Sodium 135 - 145 mmol/L 138 140 136  Potassium 3.5 - 5.1 mmol/L 3.7 3.6 3.4(L)  Chloride 101 - 111 mmol/L 105 104 100(L)  CO2 22 - 32 mmol/L '25 24 25  ' Calcium 8.9 - 10.3 mg/dL 8.9 8.6(L) 8.6(L)  Total Protein 6.5 - 8.1 g/dL 6.2(L) 5.8(L) 6.6  Total Bilirubin 0.3 - 1.2 mg/dL 0.9 0.6 0.7  Alkaline Phos 38 - 126 U/L 169(H) 156(H) 205(H)  AST 15 - 41 U/L 43(H) 23  43(H)  ALT 17 - 63 U/L 16(L) 17 15(L)       ASSESSMENT & PLAN:   Prostate cancer metastatic to bone ([facility]) 1.  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 - BRCA 1/2 negative, PDL 1 expression of 0% -Foundation 1 testing shows MS-stable, TMB intermediate, no other significant alterations to modify therapy. -Most recent progression on cabazitaxel.  He has used [place] in the past.  He had received 2 cycles of docetaxel in the beginning.  It is unclear whether this was discontinued secondary to progression of disease.   -Cycle 1 of docetaxel at 20% dose reduction started on 06/25/2017.  He received cycle 2 on 07/16/2017.  He had generalized body pains lasting for 2 days, started 1 to 2 days after treatment.  He is actively working in his garden.  He will proceed with cycle 3 today without any modifications of doses.  I plan to see him back in 3 weeks and repeat CT scan and bone scan prior to next visit.  We will also follow-up on PSA levels from today.  2.  Peripheral neuropathy: He has constant numbness in the feet which is stable.  This is from prior chemotherapy.  He takes gabapentin 1 tablet in the morning and 2 tablets at bedtime.  This has not gotten worse after start of docetaxel.  3.  Chemotherapy-induced anemia: -Hemoglobin dropped to 9.8.  This was 10.9, 3 weeks ago and 11.26 weeks ago.  I have discussed the role of parenteral iron in treating this anemia.  We discussed Feraheme given once today and once in 3 weeks prior to his next treatment.  We discussed the side effects including but not limited to severe allergic reactions.  He gives [place] permission to proceed with the treatment.      Orders placed this encounter:  Orders Placed This Encounter  Procedures  . CT Chest W Contrast  . CT Abdomen Pelvis W Contrast  . NM Bone Scan Whole Body  . CBC with Differential ([place] Only)  . CMP ([place] only)  . PSA      Derek Jack, MD [place] [phone]

## 2017-08-06 NOTE — Assessment & Plan Note (Signed)
1.  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 - BRCA 1/2 negative, PDL 1 expression of 0% -Foundation 1 testing shows MS-stable, TMB intermediate, no other significant alterations to modify therapy. -Most recent progression on cabazitaxel.  He has used [place] in the past.  He had received 2 cycles of docetaxel in the beginning.  It is unclear whether this was discontinued secondary to progression of disease.   -Cycle 1 of docetaxel at 20% dose reduction started on 06/25/2017.  He received cycle 2 on 07/16/2017.  He had generalized body pains lasting for 2 days, started 1 to 2 days after treatment.  He is actively working in his garden.  He will proceed with cycle 3 today without any modifications of doses.  I plan to see him back in 3 weeks and repeat CT scan and bone scan prior to next visit.  We will also follow-up on PSA levels from today.  2.  Peripheral neuropathy: He has constant numbness in the feet which is stable.  This is from prior chemotherapy.  He takes gabapentin 1 tablet in the morning and 2 tablets at bedtime.  This has not gotten worse after start of docetaxel.  3.  Chemotherapy-induced anemia: -Hemoglobin dropped to 9.8.  This was 10.9, 3 weeks ago and 11.26 weeks ago.  I have discussed the role of parenteral iron in treating this anemia.  We discussed Feraheme given once today and once in 3 weeks prior to his next treatment.  We discussed the side effects including but not limited to severe allergic reactions.  He gives [place] permission to proceed with the treatment.

## 2017-08-06 NOTE — Progress Notes (Signed)
1025 Labs reviewed with and pt seen by Dr. [name] and pt approved for chemo tx today per MD                                                                                  Micheal Davis tolerated Taxotere and Feraheme infusions and Neulasta on-pro well without complaints or incident. VSS upon discharge. Neulasta on-pro applied to pt's right arm with green indicator light flashing. Pt discharged self ambulatory in satisfactory condition accompanied by his wife

## 2017-08-24 ENCOUNTER — Ambulatory Visit (HOSPITAL_COMMUNITY)
Admission: RE | Admit: 2017-08-24 | Discharge: 2017-08-24 | Disposition: A | Discharge status: Home / Self Care | Payer: Medicare Other | Source: Ambulatory Visit | Attending: Hematology | Admitting: Hematology

## 2017-08-24 DIAGNOSIS — C787 Secondary malignant neoplasm of liver and intrahepatic bile duct: Secondary | ICD-10-CM | POA: Insufficient documentation

## 2017-08-24 DIAGNOSIS — C7951 Secondary malignant neoplasm of bone: Secondary | ICD-10-CM | POA: Insufficient documentation

## 2017-08-24 DIAGNOSIS — C61 Malignant neoplasm of prostate: Secondary | ICD-10-CM | POA: Insufficient documentation

## 2017-08-24 MED ORDER — TECHNETIUM TC 99M MEDRONATE IV KIT
20.0000 | PACK | Freq: Once | INTRAVENOUS | Status: AC | PRN
Start: 2017-08-24 — End: 2017-08-24
  Administered 2017-08-24: 21 via INTRAVENOUS

## 2017-08-25 ENCOUNTER — Encounter (HOSPITAL_COMMUNITY): Payer: Self-pay

## 2017-08-25 ENCOUNTER — Other Ambulatory Visit: Payer: Self-pay

## 2017-08-25 ENCOUNTER — Ambulatory Visit (HOSPITAL_COMMUNITY)
Admission: RE | Admit: 2017-08-25 | Discharge: 2017-08-25 | Disposition: A | Discharge status: Home / Self Care | Payer: Medicare Other | Source: Ambulatory Visit | Attending: Hematology | Admitting: Hematology

## 2017-08-25 ENCOUNTER — Inpatient Hospital Stay (HOSPITAL_COMMUNITY): Payer: Medicare Other

## 2017-08-25 VITALS — BP 128/64 | HR 50 | Temp 97.8°F | Resp 18

## 2017-08-25 DIAGNOSIS — C7951 Secondary malignant neoplasm of bone: Secondary | ICD-10-CM

## 2017-08-25 DIAGNOSIS — G620 Drug-induced polyneuropathy: Secondary | ICD-10-CM | POA: Diagnosis not present

## 2017-08-25 DIAGNOSIS — R918 Other nonspecific abnormal finding of lung field: Secondary | ICD-10-CM | POA: Diagnosis not present

## 2017-08-25 DIAGNOSIS — C787 Secondary malignant neoplasm of liver and intrahepatic bile duct: Secondary | ICD-10-CM | POA: Insufficient documentation

## 2017-08-25 DIAGNOSIS — Z5111 Encounter for antineoplastic chemotherapy: Secondary | ICD-10-CM | POA: Diagnosis not present

## 2017-08-25 DIAGNOSIS — C61 Malignant neoplasm of prostate: Secondary | ICD-10-CM

## 2017-08-25 DIAGNOSIS — I700 Atherosclerosis of aorta: Secondary | ICD-10-CM | POA: Diagnosis not present

## 2017-08-25 DIAGNOSIS — K8020 Calculus of gallbladder without cholecystitis without obstruction: Secondary | ICD-10-CM | POA: Diagnosis not present

## 2017-08-25 DIAGNOSIS — D6481 Anemia due to antineoplastic chemotherapy: Secondary | ICD-10-CM | POA: Diagnosis not present

## 2017-08-25 LAB — CBC WITH DIFFERENTIAL/PLATELET
Basophils Absolute: 0 10*3/uL (ref 0.0–0.1)
Basophils Relative: 1 %
EOS ABS: 0 10*3/uL (ref 0.0–0.7)
Eosinophils Relative: 0 %
HEMATOCRIT: 28.6 % — AB (ref 39.0–52.0)
HEMOGLOBIN: 9 g/dL — AB (ref 13.0–17.0)
LYMPHS ABS: 1.1 10*3/uL (ref 0.7–4.0)
Lymphocytes Relative: 16 %
MCH: 28.8 pg (ref 26.0–34.0)
MCHC: 31.5 g/dL (ref 30.0–36.0)
MCV: 91.4 fL (ref 78.0–100.0)
MONO ABS: 0.5 10*3/uL (ref 0.1–1.0)
Monocytes Relative: 7 %
NEUTROS PCT: 76 %
Neutro Abs: 5 10*3/uL (ref 1.7–7.7)
Platelets: 133 10*3/uL — ABNORMAL LOW (ref 150–400)
RBC: 3.13 MIL/uL — ABNORMAL LOW (ref 4.22–5.81)
RDW: 19.7 % — AB (ref 11.5–15.5)
WBC: 6.6 10*3/uL (ref 4.0–10.5)

## 2017-08-25 LAB — COMPREHENSIVE METABOLIC PANEL
ALK PHOS: 160 U/L — AB (ref 38–126)
ALT: 15 U/L (ref 0–44)
ANION GAP: 9 (ref 5–15)
AST: 23 U/L (ref 15–41)
Albumin: 3.4 g/dL — ABNORMAL LOW (ref 3.5–5.0)
BILIRUBIN TOTAL: 0.8 mg/dL (ref 0.3–1.2)
BUN: 11 mg/dL (ref 8–23)
CALCIUM: 8.2 mg/dL — AB (ref 8.9–10.3)
CO2: 26 mmol/L (ref 22–32)
Chloride: 105 mmol/L (ref 98–111)
Creatinine, Ser: 0.62 mg/dL (ref 0.61–1.24)
GFR calc non Af Amer: >60 mL/min (ref >60)
Glucose, Bld: 118 mg/dL — ABNORMAL HIGH (ref 70–99)
Potassium: 3.9 mmol/L (ref 3.5–5.1)
Sodium: 140 mmol/L (ref 135–145)
TOTAL PROTEIN: 6.2 g/dL — AB (ref 6.5–8.1)

## 2017-08-25 LAB — PSA: PROSTATIC SPECIFIC ANTIGEN: 161 ng/mL — AB (ref 0.00–4.00)

## 2017-08-25 MED ORDER — LEUPROLIDE ACETATE 7.5 MG IM KIT
7.5000 mg | PACK | INTRAMUSCULAR | Status: DC
  Administered 2017-08-25: 7.5 mg via INTRAMUSCULAR
  Filled 2017-08-25: qty 7.5

## 2017-08-25 MED ORDER — DENOSUMAB 120 MG/1.7ML ~~LOC~~ SOLN
120.0000 mg | Freq: Once | SUBCUTANEOUS | Status: AC
  Administered 2017-08-25: 120 mg via SUBCUTANEOUS
  Filled 2017-08-25: qty 1.7

## 2017-08-25 MED ORDER — IOPAMIDOL (ISOVUE-300) INJECTION 61%
100.0000 mL | Freq: Once | INTRAVENOUS | Status: AC | PRN
  Administered 2017-08-25: 100 mL via INTRAVENOUS

## 2017-08-25 NOTE — Progress Notes (Signed)
Corrected calcium 8.68. Dr. [name] made aware and will given xgeva today as planned along with lupron.  Lupron and xgeva given today per orders. See MAR for details. Patient tolerated it well without problems. Vitals stable and discharged home from clinic ambulatory. Follow up as scheduled.

## 2017-08-25 NOTE — Patient Instructions (Signed)
[place] at [facility] Discharge Instruction  Lupron and xgeva given today.  Follow up as scheduled  Thank you for choosing [place] at [facility] to provide your oncology and hematology care.  To afford each patient quality time with our provider, please arrive at least 15 minutes before your scheduled appointment time.   If you have a lab appointment with the [facility] please come in thru the  Main Entrance and check in at the main information desk  You need to re-schedule your appointment should you arrive 10 or more minutes late.  We strive to give you quality time with our providers, and arriving late affects you and other patients whose appointments are after yours.  Also, if you no show three or more times for appointments you may be dismissed from the clinic at the providers discretion.     Again, thank you for choosing [facility].  Our hope is that these requests will decrease the amount of time that you wait before being seen by our physicians.       _____________________________________________________________  Should you have questions after your visit to [facility], please contact our office at [phone] between the hours of 8:30 a.m. and 4:30 p.m.  Voicemails left after 4:30 p.m. will not be returned until the following business day.  For prescription refill requests, have your pharmacy contact our office.       Resources For Cancer Patients and their Caregivers ? American Cancer Society: Can assist with transportation, wigs, general needs, runs Look Good Feel Better.        229-710-0866 ? Cancer Care: Provides financial assistance, online support groups, medication/co-pay assistance.  1-800-813-HOPE (630) 740-4606) ? [place] Assists [place] cancer patients and their families through emotional , educational and financial support.  ([phone] ? [place]  DSS Where to apply for food stamps, Medicaid and utility assistance. 917-223-4028 ? [facility]: Transportation to medical appointments. (512)284-4386 ? Social Security Administration: May apply for disability if have a Stage IV cancer. [phone] 647-442-6896 ? [employer], Disability and Transit Services: Assists with nutrition, care and transit needs. [place] Support Programs:   > Cancer Support Group  2nd Tuesday of the month 1pm-2pm, Journey Room   > Creative Journey  3rd Tuesday of the month 1130am-1pm, Journey Room

## 2017-08-27 ENCOUNTER — Encounter (HOSPITAL_COMMUNITY): Payer: Self-pay | Admitting: Hematology

## 2017-08-27 ENCOUNTER — Inpatient Hospital Stay (HOSPITAL_BASED_OUTPATIENT_CLINIC_OR_DEPARTMENT_OTHER): Payer: Medicare Other | Admitting: Hematology

## 2017-08-27 ENCOUNTER — Inpatient Hospital Stay (HOSPITAL_COMMUNITY): Payer: Medicare Other

## 2017-08-27 VITALS — BP 142/63 | HR 56 | Temp 97.6°F | Resp 16 | Wt 231.7 lb

## 2017-08-27 VITALS — BP 113/63 | HR 50 | Temp 98.1°F | Resp 18

## 2017-08-27 DIAGNOSIS — D6481 Anemia due to antineoplastic chemotherapy: Secondary | ICD-10-CM | POA: Diagnosis not present

## 2017-08-27 DIAGNOSIS — C7951 Secondary malignant neoplasm of bone: Secondary | ICD-10-CM | POA: Diagnosis not present

## 2017-08-27 DIAGNOSIS — G893 Neoplasm related pain (acute) (chronic): Secondary | ICD-10-CM | POA: Diagnosis not present

## 2017-08-27 DIAGNOSIS — E119 Type 2 diabetes mellitus without complications: Secondary | ICD-10-CM | POA: Diagnosis not present

## 2017-08-27 DIAGNOSIS — C787 Secondary malignant neoplasm of liver and intrahepatic bile duct: Secondary | ICD-10-CM

## 2017-08-27 DIAGNOSIS — Z5111 Encounter for antineoplastic chemotherapy: Secondary | ICD-10-CM | POA: Diagnosis not present

## 2017-08-27 DIAGNOSIS — C61 Malignant neoplasm of prostate: Secondary | ICD-10-CM

## 2017-08-27 DIAGNOSIS — G620 Drug-induced polyneuropathy: Secondary | ICD-10-CM | POA: Diagnosis not present

## 2017-08-27 MED ORDER — SODIUM CHLORIDE 0.9 % IV SOLN: Freq: Once | INTRAVENOUS | Status: DC

## 2017-08-27 MED ORDER — SODIUM CHLORIDE 0.9 % IV SOLN: 10.0000 mg | Freq: Once | INTRAVENOUS | Status: DC

## 2017-08-27 MED ORDER — SODIUM CHLORIDE 0.9% FLUSH
10.0000 mL | INTRAVENOUS | Status: DC | PRN
  Administered 2017-08-27: 10 mL
  Filled 2017-08-27: qty 10

## 2017-08-27 MED ORDER — ONDANSETRON 8 MG PO TBDP
8.0000 mg | ORAL_TABLET | Freq: Once | ORAL | Status: AC
  Administered 2017-08-27: 8 mg via ORAL
  Filled 2017-08-27: qty 1

## 2017-08-27 MED ORDER — DOCETAXEL CHEMO INJECTION 160 MG/16ML
60.0000 mg/m2 | Freq: Once | INTRAVENOUS | Status: AC
  Administered 2017-08-27: 130 mg via INTRAVENOUS
  Filled 2017-08-27: qty 13

## 2017-08-27 MED ORDER — HEPARIN SOD (PORK) LOCK FLUSH 100 UNIT/ML IV SOLN
500.0000 [IU] | Freq: Once | INTRAVENOUS | Status: AC | PRN
  Administered 2017-08-27: 500 [IU]
  Filled 2017-08-27: qty 5

## 2017-08-27 MED ORDER — PEGFILGRASTIM 6 MG/0.6ML ~~LOC~~ PSKT
6.0000 mg | PREFILLED_SYRINGE | Freq: Once | SUBCUTANEOUS | Status: AC
  Administered 2017-08-27: 6 mg via SUBCUTANEOUS
  Filled 2017-08-27: qty 0.6

## 2017-08-27 MED ORDER — DIPHENHYDRAMINE HCL 50 MG/ML IJ SOLN
25.0000 mg | Freq: Once | INTRAMUSCULAR | Status: AC
  Administered 2017-08-27: 25 mg via INTRAVENOUS
  Filled 2017-08-27: qty 1

## 2017-08-27 MED ORDER — SODIUM CHLORIDE 0.9 % IV SOLN
Freq: Once | INTRAVENOUS | Status: AC
  Administered 2017-08-27: 11:00:00 via INTRAVENOUS

## 2017-08-27 MED ORDER — DEXAMETHASONE SODIUM PHOSPHATE 10 MG/ML IJ SOLN
10.0000 mg | Freq: Once | INTRAMUSCULAR | Status: AC
  Administered 2017-08-27: 10 mg via INTRAVENOUS
  Filled 2017-08-27: qty 1

## 2017-08-27 NOTE — Patient Instructions (Signed)
[facility] Discharge Instructions  You saw Dr. [name] today.   Thank you for choosing [facility] at [facility] to provide your oncology and hematology care.  To afford each patient quality time with our provider, please arrive at least 15 minutes before your scheduled appointment time.   If you have a lab appointment with the Cancer Center please come in thru the  Main Entrance and check in at the main information desk  You need to re-schedule your appointment should you arrive 10 or more minutes late.  We strive to give you quality time with our providers, and arriving late affects you and other patients whose appointments are after yours.  Also, if you no show three or more times for appointments you may be dismissed from the clinic at the providers discretion.     Again, thank you for choosing [facility].  Our hope is that these requests will decrease the amount of time that you wait before being seen by our physicians.       _____________________________________________________________  Should you have questions after your visit to [facility], please contact our office at [phone] between the hours of 8:30 a.m. and 4:30 p.m.  Voicemails left after 4:30 p.m. will not be returned until the following business day.  For prescription refill requests, have your pharmacy contact our office.       Resources For Cancer Patients and their Caregivers ? American Cancer Society: Can assist with transportation, wigs, general needs, runs Look Good Feel Better.        1-888-227-6333 ? Cancer Care: Provides financial assistance, online support groups, medication/co-pay assistance.  1-800-813-HOPE (4673) ? [facility] Assists [place] cancer patients and their families through emotional , educational and financial support.  336-427-4357 ? [place] DSS Where to apply for  food stamps, Medicaid and utility assistance. 336-342-1394 ? RCATS: Transportation to medical appointments. 336-347-2287 ? Social Security Administration: May apply for disability if have a Stage IV cancer. 336-342-7796 1-800-772-1213 ? [place] Aging, Disability and Transit Services: Assists with nutrition, care and transit needs. 336-349-2343  Cancer Center Support Programs:   > Cancer Support Group  2nd Tuesday of the month 1pm-2pm, Journey Room   > Creative Journey  3rd Tuesday of the month 1130am-1pm, Journey Room     

## 2017-08-27 NOTE — Progress Notes (Signed)
Micheal Davis reviewed with and pt seen by Dr. [name] today and pt approved for chemo tx per MD                                                                                  Micheal Davis tolerated chemo tx with Neulasta on-pro well without complaints or incident. VSS upon discharge. Neulasta on-pro applied to pt's right arm with green indicator light flashing. Pt discharged self ambulatory in satisfactory condition accompanied by his wife

## 2017-08-27 NOTE — Patient Instructions (Signed)
[facility] Discharge Instructions for Patients Receiving Chemotherapy   Beginning January 23rd 2017 lab work for the Cancer Center will be done in the  Main lab at [facility] on 1st floor. If you have a lab appointment with the Cancer Center please come in thru the  Main Entrance and check in at the main information desk   Today you received the following chemotherapy agents Taxotere as well as Neulasta on pro. Follow-up as scheduled. Call clinic for any questions or concerns  To help prevent nausea and vomiting after your treatment, we encourage you to take your nausea medication   If you develop nausea and vomiting, or diarrhea that is not controlled by your medication, call the clinic.  The clinic phone number is [phone]. Office hours are Monday-Friday 8:30am-5:00pm.  BELOW ARE SYMPTOMS THAT SHOULD BE REPORTED IMMEDIATELY:  *FEVER GREATER THAN 101.0 F  *CHILLS WITH OR WITHOUT FEVER  NAUSEA AND VOMITING THAT IS NOT CONTROLLED WITH YOUR NAUSEA MEDICATION  *UNUSUAL SHORTNESS OF BREATH  *UNUSUAL BRUISING OR BLEEDING  TENDERNESS IN MOUTH AND THROAT WITH OR WITHOUT PRESENCE OF ULCERS  *URINARY PROBLEMS  *BOWEL PROBLEMS  UNUSUAL RASH Items with * indicate a potential emergency and should be followed up as soon as possible. If you have an emergency after office hours please contact your primary care physician or go to the nearest emergency department.  Please call the clinic during office hours if you have any questions or concerns.   You may also contact the Patient Navigator at [phone] should you have any questions or need assistance in obtaining follow up care.      Resources For Cancer Patients and their Caregivers ? American Cancer Society: Can assist with transportation, wigs, general needs, runs Look Good Feel Better.        1-888-227-6333 ? Cancer Care: Provides financial assistance, online support groups, medication/co-pay  assistance.  1-800-813-HOPE (4673) ? [facility] Assists [place] cancer patients and their families through emotional , educational and financial support.  336-427-4357 ? [place] DSS Where to apply for food stamps, Medicaid and utility assistance. 336-342-1394 ? RCATS: Transportation to medical appointments. 336-347-2287 ? Social Security Administration: May apply for disability if have a Stage IV cancer. 336-342-7796 1-800-772-1213 ? [place] Aging, Disability and Transit Services: Assists with nutrition, care and transit needs. 336-349-2343         

## 2017-08-31 ENCOUNTER — Telehealth (HOSPITAL_COMMUNITY): Payer: Self-pay | Admitting: *Deleted

## 2017-08-31 ENCOUNTER — Other Ambulatory Visit (HOSPITAL_COMMUNITY): Payer: Self-pay | Admitting: *Deleted

## 2017-08-31 DIAGNOSIS — C61 Malignant neoplasm of prostate: Secondary | ICD-10-CM

## 2017-08-31 DIAGNOSIS — C7951 Secondary malignant neoplasm of bone: Principal | ICD-10-CM

## 2017-08-31 MED ORDER — MORPHINE SULFATE 30 MG PO TABS: 30.0000 mg | ORAL_TABLET | Freq: Four times a day (QID) | ORAL | 0 refills | Status: DC | PRN

## 2017-08-31 NOTE — Assessment & Plan Note (Signed)
1.  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 - BRCA 1/2 negative, PDL 1 expression of 0% -Foundation 1 testing shows MS-stable, TMB intermediate, no other significant alterations to modify therapy. -Most recent progression on cabazitaxel.  He has used [place] in the past.  He had received 2 cycles of docetaxel in the beginning.  It is unclear whether this was discontinued secondary to progression of disease.   -Cycle 1 of docetaxel at 20% dose reduction started on 06/25/2017.  He received cycle 2 on 07/16/2017.  Cycle 3 was on 08/06/2017. -We have reviewed the results of the CT scan and bone scan.  CT scan dated 08/25/2017 was compared to scans done on 05/06/2017.  There was addition of 2 new small lesions in the liver.  The dominant lesion appears bigger due to necrosis.  As there was 6 to 8 weeks delay in starting his first cycle of chemotherapy from the scans, these lesions could have grown during that time.  However his PSA has come down from 1 96-1 61 at cycle 3.  Hence I have recommended continuing 2 more cycles prior to scanning him again.  He will proceed with cycle 4 today.   2.  Peripheral neuropathy: He has constant numbness in the feet which is stable.  This is from prior chemotherapy.  He takes gabapentin 1 tablet in the morning and 2 tablets at bedtime.  This has not gotten worse after start of docetaxel.  3.  Chemotherapy-induced anemia: -He has normocytic anemia.  I have discussed the role of parenteral iron in treating this anemia.  We discussed Feraheme given once today and once in 3 weeks prior to his next treatment.  He received Feraheme on 08/06/2017.  We plan to give next infusion with next cycle.  4.  Cancer related pain: -We have refilled his morphine ER 60 mg every 12 hours.

## 2017-08-31 NOTE — Progress Notes (Signed)
[place], [place] [address]   CLINIC:  Medical Oncology/Hematology  PCP:  [name], MD [place] [phone]   REASON FOR VISIT:  Follow-up for metastatic prostate cancer.  CURRENT THERAPY: Docetaxel every 3 weeks.  BRIEF ONCOLOGIC HISTORY:    Prostate cancer metastatic to bone ([place])   10/01/2012 Initial Diagnosis    Prostate biopsied with highest Gleason score of 9 seen and the lowest score was 7.      10/04/2012 - 05/16/2013 Chemotherapy    Depo-Lupron and Casodex initiated      05/16/2013 -  Chemotherapy    Depo-Lupron monthly continued      05/16/2013 Progression    Progression by PSA elevation      05/16/2013 - 10/22/2014 Chemotherapy    Abiraterone and prednisone initiated in conjunction with ongoing Depo-Lupron.  Denosumab also ongoing.      10/23/2014 Progression    PSA increasing from 0.2- 1.6 in less than 6 months.        10/23/2014 - 01/30/2015 Chemotherapy    Enzalutamide and Prednisone (5 mg in AM and 2.5 mg in PM)      01/30/2015 Imaging    Bone scan- New focus of intense activity in right proximal humerus.  Interim increase in activity over left hip.      01/30/2015 Progression    Bone scan reveals new disease in right humerus consistent with progression of disease      01/31/2015 Imaging    Right humerus xray- blastic foci in proximal right humeral metaphysis and over right mid-humeral diaphysis.  No evidence of fracture      07/06/2015 Progression    Progression in multiple bones especially L hip and femurs      07/06/2015 Imaging    Bone scan- progressive multifocal osseous metastases in the right proximal femora and distal femoral shafts.  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 Tammi Klippel      11/23/2015 - 01/04/2016 Chemotherapy    The patient had  pegfilgrastim (NEULASTA ONPRO KIT) injection 6 mg, 6 mg, Subcutaneous, Once, 3 of 7 cycles  DOCEtaxel (TAXOTERE) 180 mg in dextrose 5 % 250 mL chemo infusion, 75 mg/m2 = 180 mg, Intravenous,  Once, 3 of 7 cycles Dose modification: 64 mg/m2 (original dose 75 mg/m2, Cycle 2, Reason: Dose not tolerated)  pegfilgrastim (NEULASTA ONPRO KIT) injection 6 mg, 6 mg, Subcutaneous, Once, 0 of 4 cycles  cabazitaxel (JEVTANA) 60 mg in dextrose 5 % 250 mL chemo infusion, 25 mg/m2, Intravenous,  Once, 0 of 4 cycles  for chemotherapy treatment.        11/30/2015 Adverse Reaction    Diarrhea (secondary to chemotherapy) and dehydration requiring IV fluids      12/14/2015 Treatment Plan Change    Docetaxel dose reduced by 15%      12/31/2015 Procedure    Port placed by Dr. Arnoldo Morale      01/28/2016 -  Chemotherapy    Cabazitaxel (Jevtana)       03/27/2016 Imaging    CT Chest, Abdomen, and Pelvis with contrast 1. Diffuse sclerotic osseous metastatic disease in the chest, abdomen, and pelvis without acute fracture identified. 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 These appear chronic. 2. The prostate gland is normal in size and  no adenopathy is currently identified. 3. On a prior MRI of 10/04/2012, there was a posterior right hepatic lobe lesion. This lesion is not currently visible on today' s CT. This could be due to differences in cons acuity between CT or MRI, or resolution of the lesion. 4. Coronary, aortic arch, and branch vessel atherosclerotic vascular disease. Aortoiliac atherosclerotic vascular disease. 5. Single bilateral renal cysts.      04/16/2016 Imaging    Bone scan- Findings consist with progressive metastatic disease. Activity over the proximal right humerus and proximal bilateral femurs are worrisome for the possible development of pathologic fractures.      08/21/2016 Imaging    CT C/A/P: IMPRESSION: No significant change since  03/27/2016 CT. Diffuse bony metastases without other evidence of metastatic disease.      08/21/2016 Imaging    Bone Scan: IMPRESSION: Multiple areas of increased activity again noted throughout the axial and appendicular skeleton in similar locations as prior exam. Intensity of uptake is increased from prior exam suggesting progressive disease. Lesions present in the proximal humeri, proximal femurs, and the mid right femur susceptible to pathologic fracture.      12/02/2016 Imaging    CT C/A/P: IMPRESSION: 1. Overall stable appearance of diffuse osseous metastatic disease and resulting patchy sclerosis. 2. The patient had a posterior right hepatic lobe lesion on prior MRI from 2014 which is been relatively occult on CT. Given the lack of progression I suspect that this is benign or has been effectively treated. 3. Aortic Atherosclerosis (ICD10-I70.0). Coronary atherosclerosis with mild cardiomegaly. 4. Cholelithiasis. 5. Bilateral benign renal cysts. 6. 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      12/02/2016 Imaging    Bone Scan: IMPRESSION: 1. Widespread osseous metastatic disease. Multiplicity is similar to previous exam with interval increase in degree of tracer uptake associated with multiple lesions.      03/11/2017 Imaging    Bone Scan: Multifocal skeletal disease without sign of progression CT C/A/P: Diffuse sclerotic skeletal lesions, unchanged from the previous.  No new lymphadenopathy, pulmonary nodules, or hepatic lesions to suggest soft tissue progressive disease      06/18/2017 -  Chemotherapy    The patient had pegfilgrastim (NEULASTA ONPRO KIT) injection 6 mg, 6 mg, Subcutaneous, Once, 4 of 4 cycles Administration: 6 mg (06/25/2017), 6 mg (07/16/2017), 6 mg (08/06/2017), 6 mg  (08/27/2017) DOCEtaxel (TAXOTERE) 130 mg in sodium chloride 0.9 % 250 mL chemo infusion, 60 mg/m2 = 130 mg (80 % of original dose 75 mg/m2), Intravenous,  Once, 4 of 4 cycles Dose modification: 60 mg/m2 (80 % of original dose 75 mg/m2, Cycle 1, Reason: Provider Judgment) Administration: 130 mg (06/25/2017), 130 mg (07/16/2017), 130 mg (08/06/2017), 130 mg (08/27/2017) ondansetron (ZOFRAN) 8 mg in sodium chloride 0.9 % 50 mL IVPB, , Intravenous,  Once, 4 of 4 cycles  for chemotherapy treatment.         CANCER STAGING: Cancer Staging Prostate cancer metastatic to bone [facility]) Staging form: Prostate, AJCC 7th Edition - Clinical: No stage assigned - Unsigned    INTERVAL HISTORY:  Mr. Deason 72 y.o. male returns for routine follow-up and consideration for next cycle of chemotherapy.  He is here for cycle 4 of chemotherapy.  He tolerated cycle 3 very well.  He had mild aches and pains all over the body for a couple of days.  Denied any nausea, vomiting or diarrhea.  Mild fatigue is stable.  He had back pain on and off for a while, but has gotten worse in the last 2 weeks.  He was working around his house.  Constant numbness in the feet has been stable.    REVIEW OF SYSTEMS:  Review of Systems  Constitutional: Positive for fatigue.  Gastrointestinal: Positive for constipation.  Musculoskeletal: Positive for back pain.  Neurological: Positive for numbness.  All other systems reviewed and are negative.    PAST MEDICAL/SURGICAL HISTORY:  Past Medical History:  Diagnosis Date  . Diabetes mellitus without complication ([place])   . Hypertension   . Prostate cancer ([place]) 09/05/2015  . Prostate cancer metastatic to bone ([place]) 09/05/2015  . Sleep apnea   . Thyroid disease    Past Surgical History:  Procedure Laterality Date  . CATARACT EXTRACTION    . PORTACATH PLACEMENT Left 12/31/2015   Procedure: INSERTION PORT-A-CATH LEFT SUBCLAVIAN;  Surgeon: Aviva Signs, MD;  Location: [facility];  Service:  General;  Laterality: Left;  . REPLACEMENT TOTAL KNEE Left      SOCIAL HISTORY:  Social History   Socioeconomic History  . Marital status: Married    Spouse name: Not on file  . Number of children: Not on file  . Years of education: Not on file  . Highest education level: Not on file  Occupational History  . Not on file  Social Needs  . Financial resource strain: Not on file  . Food insecurity:    Worry: Not on file    Inability: Not on file  . Transportation needs:    Medical: Not on file    Non-medical: Not on file  Tobacco Use  . Smoking status: Never Smoker  . Smokeless tobacco: Never Used  Substance and Sexual Activity  . Alcohol use: Yes    Comment: 1 beer each month  . Drug use: No  . Sexual activity: Never    Comment: married  Lifestyle  . Physical activity:    Days per week: Not on file    Minutes per session: Not on file  . Stress: Not on file  Relationships  . Social connections:    Talks on phone: Not on file    Gets together: Not on file    Attends religious service: Not on file    Active member of club or organization: Not on file    Attends meetings of clubs or organizations: Not on file    Relationship status: Not on file  . Intimate partner violence:    Fear of current or ex partner: Not on file    Emotionally abused: Not on file    Physically abused: Not on file    Forced sexual activity: Not on file  Other Topics Concern  . Not on file  Social History Narrative  . Not on file    FAMILY HISTORY:  History reviewed. No pertinent family history.  CURRENT MEDICATIONS:  Outpatient Encounter Medications as of 08/27/2017  Medication Sig Note  . atenolol (TENORMIN) 100 MG tablet Take 100 mg by mouth daily.   Marland Kitchen atorvastatin (LIPITOR) 20 MG tablet Take 10 mg by mouth daily.   . calcium carbonate (OS-CAL - DOSED IN MG OF ELEMENTAL CALCIUM) 1250 (500 Ca) MG tablet Take 1 tablet by mouth 2 (two) times daily.    Marland Kitchen denosumab (XGEVA) 120 MG/1.7ML  SOLN injection Inject 120 mg into the skin every 28 (twenty-eight) days. 05/18/2017: On hold per MD  .  diltiazem (CARDIZEM CD) 300 MG 24 hr capsule Take 300 mg by mouth daily.   . DOCEtaxel (TAXOTERE IV) Inject into the vein.   Marland Kitchen gabapentin (NEURONTIN) 300 MG capsule Patient may take 1 capsule in the morning and 2 capsules @ bedtime.   . Glucosamine-Chondroit-Vit C-Mn (GLUCOSAMINE 1500 COMPLEX PO) Take 1 tablet by mouth 2 (two) times daily.    Marland Kitchen KLOR-CON M20 20 MEQ tablet TAKE 1 TABLET BY MOUTH ONCE DAILY (Patient taking differently: TAKE 20 MEQ BY MOUTH ONCE DAILY)   . leuprolide (LUPRON) 7.5 MG injection Inject 7.5 mg into the muscle every 28 (twenty-eight) days.   Marland Kitchen levothyroxine (SYNTHROID, LEVOTHROID) 25 MCG tablet Take 25 mcg by mouth daily before breakfast.   . loperamide (IMODIUM) 1 MG/5ML solution Take 1 mg by mouth as needed for diarrhea or loose stools.   . Lysine 500 MG CAPS Take 500 mg by mouth daily.   . magic mouthwash w/lidocaine SOLN 1 part of each of the following: Benadryl 12.68m /540m Viscous lidocaine 2%, Maalox. Swish and swallow 5 mL QID.   . Marland KitchenetFORMIN (GLUCOPHAGE) 500 MG tablet Take 500 mg by mouth 2 (two) times daily with a meal.   . morphine (MSIR) 30 MG tablet Take 1 tablet (30 mg total) by mouth every 6 (six) hours as needed for severe pain.   . Multiple Vitamin (MULTIVITAMIN WITH MINERALS) TABS tablet Take 1 tablet by mouth daily.   . Omega-3 Fatty Acids (FISH OIL PO) Take 1 capsule by mouth daily.   . ondansetron (ZOFRAN-ODT) 8 MG disintegrating tablet Take 1 tablet (8 mg total) by mouth every 8 (eight) hours as needed for nausea or vomiting.   . pegfilgrastim-cbqv (UDENYCA) 6 MG/0.6ML injection Inject 6 mg into the skin once.   . predniSONE (DELTASONE) 5 MG tablet Take 5 mg by mouth 2 (two) times daily with a meal.   . tamsulosin (FLOMAX) 0.4 MG CAPS capsule Take 0.8 mg by mouth daily.    . Marland Kitchenriamterene-hydrochlorothiazide (MAXZIDE-25) 37.5-25 MG tablet Take 1 tablet  by mouth daily. for high blood pressure   . [DISCONTINUED] prochlorperazine (COMPAZINE) 10 MG tablet Take 1 tablet (10 mg total) by mouth every 6 (six) hours as needed (Nausea or vomiting). (Patient taking differently: Take 10 mg by mouth every 6 (six) hours as needed for nausea or vomiting. )    Facility-Administered Encounter Medications as of 08/27/2017  Medication  . leuprolide (LUPRON) injection 7.5 mg    ALLERGIES:  No Known Allergies   PHYSICAL EXAM:  ECOG Performance status: 1  Vitals:   08/27/17 0932  BP: (!) 142/63  Pulse: (!) 56  Resp: 16  Temp: 97.6 F (36.4 C)  SpO2: 97%   Filed Weights   08/27/17 0932  Weight: 231 lb 11.2 oz (105.1 kg)    Physical Exam   LABORATORY DATA:  I have reviewed the labs as listed.  CBC    Component Value Date/Time   WBC 6.6 08/25/2017 0924   RBC 3.13 (L) 08/25/2017 0924   HGB 9.0 (L) 08/25/2017 0924   HCT 28.6 (L) 08/25/2017 0924   PLT 133 (L) 08/25/2017 0924   MCV 91.4 08/25/2017 0924   MCH 28.8 08/25/2017 0924   MCHC 31.5 08/25/2017 0924   RDW 19.7 (H) 08/25/2017 0924   LYMPHSABS 1.1 08/25/2017 0924   MONOABS 0.5 08/25/2017 0924   EOSABS 0.0 08/25/2017 0924   BASOSABS 0.0 08/25/2017 0924   CMP Latest Ref Rng & Units 08/25/2017 08/06/2017 07/28/2017  Glucose  70 - 99 mg/dL 118(H) 182(H) 176(H)  BUN 8 - 23 mg/dL _0 Creatinine 0.61 - 1.24 mg/dL 0.62 0.79 0.68  Sodium 135 - 145 mmol/L 140 138 140  Potassium 3.5 - 5.1 mmol/L 3.9 3.7 3.6  Chloride 98 - 111 mmol/L 105 105 104  CO2 22 - 32 mmol/L _1 Calcium 8.9 - 10.3 mg/dL 8.2(L) 8.9 8.6(L)  Total Protein 6.5 - 8.1 g/dL 6.2(L) 6.2(L) 5.8(L)  Total Bilirubin 0.3 - 1.2 mg/dL 0.8 0.9 0.6  Alkaline Phos 38 - 126 U/L 160(H) 169(H) 156(H)  AST 15 - 41 U/L 23 43(H) 23  ALT 0 - 44 U/L 15 16(L) 17       DIAGNOSTIC IMAGING:  I have personally reviewed the images of CT scan and bone scan dated 08/25/2017 and discussed with the patient.     ASSESSMENT & PLAN:    Prostate cancer metastatic to bone ([place]) 1.  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 - BRCA 1/2 negative, PDL 1 expression of 0% -Foundation 1 testing shows MS-stable, TMB intermediate, no other significant alterations to modify therapy. -Most recent progression on cabazitaxel.  He has used [place] in the past.  He had received 2 cycles of docetaxel in the beginning.  It is unclear whether this was discontinued secondary to progression of disease.   -Cycle 1 of docetaxel at 20% dose reduction started on 06/25/2017.  He received cycle 2 on 07/16/2017.  Cycle 3 was on 08/06/2017. -We have reviewed the results of the CT scan and bone scan.  CT scan dated 08/25/2017 was compared to scans done on 05/06/2017.  There was addition of 2 new small lesions in the liver.  The dominant lesion appears bigger due to necrosis.  As there was 6 to 8 weeks delay in starting his first cycle of chemotherapy from the scans, these lesions could have grown during that time.  However his PSA has come down from 1 96-1 61 at cycle 3.  Hence I have recommended continuing 2 more cycles prior to scanning him again.  He will proceed with cycle 4 today.   2.  Peripheral neuropathy: He has constant numbness in the feet which is stable.  This is from prior chemotherapy.  He takes gabapentin 1 tablet in the morning and 2 tablets at bedtime.  This has not gotten worse after start of docetaxel.  3.  Chemotherapy-induced anemia: -He has normocytic anemia.  I have discussed the role of parenteral iron in treating this anemia.  We discussed Feraheme given once today and once in 3 weeks prior to his next treatment.  He received Feraheme on 08/06/2017.  We plan to give next infusion with next cycle.  4.  Cancer related pain: -We have refilled his morphine ER 60 mg every 12 hours.      Orders placed this encounter:  Orders Placed This Encounter  Procedures   . CBC with Differential/Platelet  . Comprehensive metabolic panel      Derek Jack, MD [place] [phone]

## 2017-09-02 ENCOUNTER — Other Ambulatory Visit (HOSPITAL_COMMUNITY): Payer: Self-pay | Admitting: *Deleted

## 2017-09-02 DIAGNOSIS — C61 Malignant neoplasm of prostate: Secondary | ICD-10-CM

## 2017-09-02 DIAGNOSIS — C7951 Secondary malignant neoplasm of bone: Principal | ICD-10-CM

## 2017-09-02 MED ORDER — MORPHINE SULFATE 30 MG PO TABS: 30.0000 mg | ORAL_TABLET | Freq: Four times a day (QID) | ORAL | 0 refills | Status: DC | PRN

## 2017-09-04 ENCOUNTER — Other Ambulatory Visit (HOSPITAL_COMMUNITY): Payer: Self-pay | Admitting: Nurse Practitioner

## 2017-09-04 DIAGNOSIS — C7951 Secondary malignant neoplasm of bone: Principal | ICD-10-CM

## 2017-09-04 DIAGNOSIS — C61 Malignant neoplasm of prostate: Secondary | ICD-10-CM

## 2017-09-04 MED ORDER — MORPHINE SULFATE ER 60 MG PO TBCR: 60.0000 mg | EXTENDED_RELEASE_TABLET | Freq: Two times a day (BID) | ORAL | 0 refills | Status: DC

## 2017-09-16 NOTE — Addendum Note (Signed)
Encounter addended by: Mickie Bail A on: 09/16/2017 3:54 PM  Actions taken: Imaging Exam ended, Charge Capture section accepted

## 2017-09-17 ENCOUNTER — Other Ambulatory Visit: Payer: Self-pay

## 2017-09-17 ENCOUNTER — Inpatient Hospital Stay (HOSPITAL_COMMUNITY): Payer: Medicare Other

## 2017-09-17 ENCOUNTER — Inpatient Hospital Stay (HOSPITAL_COMMUNITY): Payer: Medicare Other | Attending: Hematology | Admitting: Hematology

## 2017-09-17 ENCOUNTER — Encounter (HOSPITAL_COMMUNITY): Payer: Self-pay | Admitting: Hematology

## 2017-09-17 VITALS — BP 123/61 | HR 54 | Temp 97.5°F | Resp 18 | Wt 225.2 lb

## 2017-09-17 VITALS — BP 110/53 | HR 50 | Temp 97.9°F | Resp 18

## 2017-09-17 DIAGNOSIS — I10 Essential (primary) hypertension: Secondary | ICD-10-CM | POA: Diagnosis not present

## 2017-09-17 DIAGNOSIS — C7951 Secondary malignant neoplasm of bone: Secondary | ICD-10-CM | POA: Insufficient documentation

## 2017-09-17 DIAGNOSIS — C787 Secondary malignant neoplasm of liver and intrahepatic bile duct: Secondary | ICD-10-CM

## 2017-09-17 DIAGNOSIS — G629 Polyneuropathy, unspecified: Secondary | ICD-10-CM | POA: Diagnosis not present

## 2017-09-17 DIAGNOSIS — Z5111 Encounter for antineoplastic chemotherapy: Secondary | ICD-10-CM | POA: Diagnosis not present

## 2017-09-17 DIAGNOSIS — G893 Neoplasm related pain (acute) (chronic): Secondary | ICD-10-CM | POA: Insufficient documentation

## 2017-09-17 DIAGNOSIS — C61 Malignant neoplasm of prostate: Secondary | ICD-10-CM | POA: Diagnosis not present

## 2017-09-17 DIAGNOSIS — D6481 Anemia due to antineoplastic chemotherapy: Secondary | ICD-10-CM | POA: Insufficient documentation

## 2017-09-17 DIAGNOSIS — Z923 Personal history of irradiation: Secondary | ICD-10-CM

## 2017-09-17 DIAGNOSIS — Z9221 Personal history of antineoplastic chemotherapy: Secondary | ICD-10-CM | POA: Diagnosis not present

## 2017-09-17 DIAGNOSIS — E119 Type 2 diabetes mellitus without complications: Secondary | ICD-10-CM | POA: Insufficient documentation

## 2017-09-17 LAB — COMPREHENSIVE METABOLIC PANEL
ALBUMIN: 3.6 g/dL (ref 3.5–5.0)
ALK PHOS: 148 U/L — AB (ref 38–126)
ALT: 15 U/L (ref 0–44)
ANION GAP: 9 (ref 5–15)
AST: 24 U/L (ref 15–41)
BILIRUBIN TOTAL: 0.7 mg/dL (ref 0.3–1.2)
BUN: 15 mg/dL (ref 8–23)
CO2: 26 mmol/L (ref 22–32)
Calcium: 8.6 mg/dL — ABNORMAL LOW (ref 8.9–10.3)
Chloride: 103 mmol/L (ref 98–111)
Creatinine, Ser: 0.76 mg/dL (ref 0.61–1.24)
GFR calc non Af Amer: >60 mL/min (ref >60)
GLUCOSE: 111 mg/dL — AB (ref 70–99)
POTASSIUM: 3.8 mmol/L (ref 3.5–5.1)
Sodium: 138 mmol/L (ref 135–145)
TOTAL PROTEIN: 6.3 g/dL — AB (ref 6.5–8.1)

## 2017-09-17 LAB — CBC WITH DIFFERENTIAL/PLATELET
BASOS PCT: 1 %
Basophils Absolute: 0.1 10*3/uL (ref 0.0–0.1)
EOS ABS: 0 10*3/uL (ref 0.0–0.7)
Eosinophils Relative: 1 %
HEMATOCRIT: 29 % — AB (ref 39.0–52.0)
HEMOGLOBIN: 9.3 g/dL — AB (ref 13.0–17.0)
LYMPHS ABS: 1.3 10*3/uL (ref 0.7–4.0)
Lymphocytes Relative: 20 %
MCH: 29.9 pg (ref 26.0–34.0)
MCHC: 32.1 g/dL (ref 30.0–36.0)
MCV: 93.2 fL (ref 78.0–100.0)
MONO ABS: 0.5 10*3/uL (ref 0.1–1.0)
MONOS PCT: 7 %
Neutro Abs: 4.6 10*3/uL (ref 1.7–7.7)
Neutrophils Relative %: 71 %
Platelets: 134 10*3/uL — ABNORMAL LOW (ref 150–400)
RBC: 3.11 MIL/uL — ABNORMAL LOW (ref 4.22–5.81)
RDW: 20.3 % — AB (ref 11.5–15.5)
WBC: 6.4 10*3/uL (ref 4.0–10.5)

## 2017-09-17 LAB — PSA: PROSTATIC SPECIFIC ANTIGEN: 184 ng/mL — AB (ref 0.00–4.00)

## 2017-09-17 MED ORDER — DEXAMETHASONE SODIUM PHOSPHATE 10 MG/ML IJ SOLN
10.0000 mg | Freq: Once | INTRAMUSCULAR | Status: AC
  Administered 2017-09-17: 10 mg via INTRAVENOUS

## 2017-09-17 MED ORDER — PEGFILGRASTIM 6 MG/0.6ML ~~LOC~~ PSKT
6.0000 mg | PREFILLED_SYRINGE | Freq: Once | SUBCUTANEOUS | Status: AC
  Administered 2017-09-17: 6 mg via SUBCUTANEOUS

## 2017-09-17 MED ORDER — PEGFILGRASTIM 6 MG/0.6ML ~~LOC~~ PSKT
PREFILLED_SYRINGE | SUBCUTANEOUS | Status: AC
  Filled 2017-09-17: qty 0.6

## 2017-09-17 MED ORDER — DEXAMETHASONE SODIUM PHOSPHATE 10 MG/ML IJ SOLN
INTRAMUSCULAR | Status: AC
  Filled 2017-09-17: qty 1

## 2017-09-17 MED ORDER — DOCETAXEL CHEMO INJECTION 160 MG/16ML
60.0000 mg/m2 | Freq: Once | INTRAVENOUS | Status: AC
  Administered 2017-09-17: 130 mg via INTRAVENOUS
  Filled 2017-09-17: qty 13

## 2017-09-17 MED ORDER — ONDANSETRON 8 MG PO TBDP
ORAL_TABLET | ORAL | Status: AC
  Filled 2017-09-17: qty 1

## 2017-09-17 MED ORDER — ONDANSETRON 8 MG PO TBDP
8.0000 mg | ORAL_TABLET | Freq: Once | ORAL | Status: AC
  Administered 2017-09-17: 8 mg via ORAL

## 2017-09-17 MED ORDER — DIPHENHYDRAMINE HCL 50 MG/ML IJ SOLN
INTRAMUSCULAR | Status: AC
  Filled 2017-09-17: qty 1

## 2017-09-17 MED ORDER — SODIUM CHLORIDE 0.9 % IV SOLN
510.0000 mg | Freq: Once | INTRAVENOUS | Status: AC
  Administered 2017-09-17: 510 mg via INTRAVENOUS
  Filled 2017-09-17: qty 17

## 2017-09-17 MED ORDER — SODIUM CHLORIDE 0.9 % IV SOLN: Freq: Once | INTRAVENOUS | Status: DC

## 2017-09-17 MED ORDER — SODIUM CHLORIDE 0.9 % IV SOLN: 510.0000 mg | Freq: Once | INTRAVENOUS | Status: DC

## 2017-09-17 MED ORDER — SODIUM CHLORIDE 0.9 % IV SOLN
Freq: Once | INTRAVENOUS | Status: AC
  Administered 2017-09-17: 500 mL via INTRAVENOUS

## 2017-09-17 MED ORDER — HEPARIN SOD (PORK) LOCK FLUSH 100 UNIT/ML IV SOLN
500.0000 [IU] | Freq: Once | INTRAVENOUS | Status: AC | PRN
  Administered 2017-09-17: 500 [IU]

## 2017-09-17 MED ORDER — SODIUM CHLORIDE 0.9% FLUSH
10.0000 mL | INTRAVENOUS | Status: DC | PRN
  Administered 2017-09-17: 10 mL
  Filled 2017-09-17: qty 10

## 2017-09-17 MED ORDER — DIPHENHYDRAMINE HCL 50 MG/ML IJ SOLN
25.0000 mg | Freq: Once | INTRAMUSCULAR | Status: AC
  Administered 2017-09-17: 25 mg via INTRAVENOUS

## 2017-09-17 MED ORDER — SODIUM CHLORIDE 0.9 % IV SOLN: 10.0000 mg | Freq: Once | INTRAVENOUS | Status: DC

## 2017-09-17 NOTE — Assessment & Plan Note (Signed)
1.  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 - BRCA 1/2 negative, PDL 1 expression of 0% -Foundation 1 testing shows MS-stable, TMB intermediate, no other significant alterations to modify therapy. -Most recent progression on cabazitaxel.  He has used [place] in the past.  He had received 2 cycles of docetaxel in the beginning.  It is unclear whether this was discontinued secondary to progression of disease.   -Cycle 1 of docetaxel at 20% dose reduction started on 06/25/2017.  He received cycle 2 on 07/16/2017.  Cycle 3 was on 08/06/2017. -We have reviewed the results of the CT scan and bone scan.  CT scan dated 08/25/2017 was compared to scans done on 05/06/2017.  There was addition of 2 new small lesions in the liver.  The dominant lesion appears bigger due to necrosis.  As there was 6 to 8 weeks delay in starting his first cycle of chemotherapy from the scans, these lesions could have grown during that time.  However his PSA has come down from 196 to161 at cycle 3.  He received cycle 4 on 08/27/2017. - He reportedly fell one time at home.  Denies any lightheadedness.  It happened when he got up after 2 hours of sleep.  No major injuries.  We checked for orthostatics today which was within normal limits.  He will proceed with cycle 5 today.  I plan to repeat CT scan of the abdomen and pelvis and a bone scan.  PSA from today is pending.  We will change the Lupron to every 12 weeks. -he is complaining of pain in the right lower molar.  He is supposed to see [employer] [employer].  We will hold his next dose of denosumab.  2.  Peripheral neuropathy: He has constant numbness in the feet which is stable.  This is from prior chemotherapy.  He takes gabapentin 1 tablet in the morning and 2 tablets at bedtime. This has not gotten worse after start of docetaxel.  3.  Chemotherapy-induced anemia: -He has normocytic anemia.  He received first  Feraheme on 08/06/2017 without any problems.  He will proceed with his second Feraheme today.  4.  Cancer related pain: -We have refilled his morphine ER 60 mg every 12 hours.  He takes morphine IR 30 mg in between as needed.

## 2017-09-17 NOTE — Patient Instructions (Signed)
[facility] at [facility] Discharge Instructions  Today you saw Dr. K.   Thank you for choosing [facility] at [facility] to provide your oncology and hematology care.  To afford each patient quality time with our provider, please arrive at least 15 minutes before your scheduled appointment time.   If you have a lab appointment with the Cancer Center please come in thru the  Main Entrance and check in at the main information desk  You need to re-schedule your appointment should you arrive 10 or more minutes late.  We strive to give you quality time with our providers, and arriving late affects you and other patients whose appointments are after yours.  Also, if you no show three or more times for appointments you may be dismissed from the clinic at the providers discretion.     Again, thank you for choosing [facility].  Our hope is that these requests will decrease the amount of time that you wait before being seen by our physicians.       _____________________________________________________________  Should you have questions after your visit to [facility], please contact our office at [phone] between the hours of 8:30 a.m. and 4:30 p.m.  Voicemails left after 4:30 p.m. will not be returned until the following business day.  For prescription refill requests, have your pharmacy contact our office.       Resources For Cancer Patients and their Caregivers ? American Cancer Society: Can assist with transportation, wigs, general needs, runs Look Good Feel Better.        1-888-227-6333 ? Cancer Care: Provides financial assistance, online support groups, medication/co-pay assistance.  1-800-813-HOPE (4673) ? [facility] Assists [place] cancer patients and their families through emotional , educational and financial support.  336-427-4357 ? [place] DSS Where to apply for food  stamps, Medicaid and utility assistance. 336-342-1394 ? [facility]: Transportation to medical appointments. 336-347-2287 ? Social Security Administration: May apply for disability if have a Stage IV cancer. 336-342-7796 1-800-772-1213 ? [place] Aging, Disability and Transit Services: Assists with nutrition, care and transit needs. 336-349-2343  Cancer Center Support Programs:   > Cancer Support Group  2nd Tuesday of the month 1pm-2pm, Journey Room   > Creative Journey  3rd Tuesday of the month 1130am-1pm, Journey Room    

## 2017-09-17 NOTE — Progress Notes (Signed)
[address] [place], [place] [address]   CLINIC:  Medical Oncology/Hematology  PCP:  [name], MD [place] [address] [phone]   REASON FOR VISIT:  Follow-up for metastatic prostate cancer  CURRENT THERAPY: Docetaxel every 3 weeks  BRIEF ONCOLOGIC HISTORY:    Prostate cancer metastatic to bone ([place])   10/01/2012 Initial Diagnosis    Prostate biopsied with highest Gleason score of 9 seen and the lowest score was 7.      10/04/2012 - 05/16/2013 Chemotherapy    Depo-Lupron and Casodex initiated      05/16/2013 -  Chemotherapy    Depo-Lupron monthly continued      05/16/2013 Progression    Progression by PSA elevation      05/16/2013 - 10/22/2014 Chemotherapy    Abiraterone and prednisone initiated in conjunction with ongoing Depo-Lupron.  Denosumab also ongoing.      10/23/2014 Progression    PSA increasing from 0.2- 1.6 in less than 6 months.        10/23/2014 - 01/30/2015 Chemotherapy    Enzalutamide and Prednisone (5 mg in AM and 2.5 mg in PM)      01/30/2015 Imaging    Bone scan- New focus of intense activity in right proximal humerus.  Interim increase in activity over left hip.      01/30/2015 Progression    Bone scan reveals new disease in right humerus consistent with progression of disease      01/31/2015 Imaging    Right humerus xray- blastic foci in proximal right humeral metaphysis and over right mid-humeral diaphysis.  No evidence of fracture      07/06/2015 Progression    Progression in multiple bones especially L hip and femurs      07/06/2015 Imaging    Bone scan- progressive multifocal osseous metastases in the right proximal femora and distal femoral shafts.  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 Tammi Klippel      11/23/2015 - 01/04/2016 Chemotherapy    The patient had pegfilgrastim  (NEULASTA ONPRO KIT) injection 6 mg, 6 mg, Subcutaneous, Once, 3 of 7 cycles  DOCEtaxel (TAXOTERE) 180 mg in dextrose 5 % 250 mL chemo infusion, 75 mg/m2 = 180 mg, Intravenous,  Once, 3 of 7 cycles Dose modification: 64 mg/m2 (original dose 75 mg/m2, Cycle 2, Reason: Dose not tolerated)  pegfilgrastim (NEULASTA ONPRO KIT) injection 6 mg, 6 mg, Subcutaneous, Once, 0 of 4 cycles  cabazitaxel (JEVTANA) 60 mg in dextrose 5 % 250 mL chemo infusion, 25 mg/m2, Intravenous,  Once, 0 of 4 cycles  for chemotherapy treatment.        11/30/2015 Adverse Reaction    Diarrhea (secondary to chemotherapy) and dehydration requiring IV fluids      12/14/2015 Treatment Plan Change    Docetaxel dose reduced by 15%      12/31/2015 Procedure    Port placed by Dr. Arnoldo Morale      01/28/2016 -  Chemotherapy    Cabazitaxel (Jevtana)       03/27/2016 Imaging    CT Chest, Abdomen, and Pelvis with contrast 1. Diffuse sclerotic osseous metastatic disease in the chest, abdomen, and pelvis without acute fracture identified. 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 These appear chronic. 2. The prostate gland is normal in size and  no adenopathy is currently identified. 3. On a prior MRI of 10/04/2012, there was a posterior right hepatic lobe lesion. This lesion is not currently visible on today' s CT. This could be due to differences in cons acuity between CT or MRI, or resolution of the lesion. 4. Coronary, aortic arch, and branch vessel atherosclerotic vascular disease. Aortoiliac atherosclerotic vascular disease. 5. Single bilateral renal cysts.      04/16/2016 Imaging    Bone scan- Findings consist with progressive metastatic disease. Activity over the proximal right humerus and proximal bilateral femurs are worrisome for the possible development of pathologic fractures.      08/21/2016 Imaging    CT C/A/P: IMPRESSION: No significant change since 03/27/2016 CT.  Diffuse bony metastases without other evidence of metastatic disease.      08/21/2016 Imaging    Bone Scan: IMPRESSION: Multiple areas of increased activity again noted throughout the axial and appendicular skeleton in similar locations as prior exam. Intensity of uptake is increased from prior exam suggesting progressive disease. Lesions present in the proximal humeri, proximal femurs, and the mid right femur susceptible to pathologic fracture.      12/02/2016 Imaging    CT C/A/P: IMPRESSION: 1. Overall stable appearance of diffuse osseous metastatic disease and resulting patchy sclerosis. 2. The patient had a posterior right hepatic lobe lesion on prior MRI from 2014 which is been relatively occult on CT. Given the lack of progression I suspect that this is benign or has been effectively treated. 3. Aortic Atherosclerosis (ICD10-I70.0). Coronary atherosclerosis with mild cardiomegaly. 4. Cholelithiasis. 5. Bilateral benign renal cysts. 6. 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      12/02/2016 Imaging    Bone Scan: IMPRESSION: 1. Widespread osseous metastatic disease. Multiplicity is similar to previous exam with interval increase in degree of tracer uptake associated with multiple lesions.      03/11/2017 Imaging    Bone Scan: Multifocal skeletal disease without sign of progression CT C/A/P: Diffuse sclerotic skeletal lesions, unchanged from the previous.  No new lymphadenopathy, pulmonary nodules, or hepatic lesions to suggest soft tissue progressive disease      06/18/2017 -  Chemotherapy    The patient had pegfilgrastim (NEULASTA ONPRO KIT) injection 6 mg, 6 mg, Subcutaneous, Once, 5 of 5 cycles Administration: 6 mg (06/25/2017), 6 mg (07/16/2017), 6 mg (08/06/2017), 6 mg (08/27/2017) DOCEtaxel  (TAXOTERE) 130 mg in sodium chloride 0.9 % 250 mL chemo infusion, 60 mg/m2 = 130 mg (80 % of original dose 75 mg/m2), Intravenous,  Once, 5 of 5 cycles Dose modification: 60 mg/m2 (80 % of original dose 75 mg/m2, Cycle 1, Reason: Provider Judgment) Administration: 130 mg (06/25/2017), 130 mg (07/16/2017), 130 mg (08/06/2017), 130 mg (08/27/2017) ondansetron (ZOFRAN) 8 mg in sodium chloride 0.9 % 50 mL IVPB, , Intravenous,  Once, 5 of 5 cycles  for chemotherapy treatment.         CANCER STAGING: Cancer Staging Prostate cancer metastatic to bone [facility]) Staging form: Prostate, AJCC 7th Edition - Clinical: No stage assigned - Unsigned    INTERVAL HISTORY:  Micheal Davis 72 y.o. male returns for routine follow-up for metastatic prostate cancer. Patient is here today with his wife. He is doing well with chemo. He did have a issue with diarrhea and feeling bad for a few day last week. He lost 12 pound in 3  days. He feels he may have gotten a bug. It was over in 3 days and he was feeling better. He has also has a tooth ache on the right lower wisdom tooth. He will follow up with his dentist next week. Patient states he fell a few weeks ago he has a scratch on the back of his neck that has healed up now. Feels he is still a little sore from it however it is getting better. Denies any nausea, vomiting, or diarrhea. Denies any SOB.     REVIEW OF SYSTEMS:  Review of Systems  Constitutional: Positive for fatigue.       Wisdom tooth pain upper right  HENT:  Negative.   Eyes: Negative.   Respiratory: Negative.   Cardiovascular: Positive for leg swelling.  Gastrointestinal: Negative.   Endocrine: Negative.   Genitourinary: Negative.    Musculoskeletal: Negative.   Skin: Negative.   Neurological: Positive for numbness.  Hematological: Negative.   Psychiatric/Behavioral: Negative.      PAST MEDICAL/SURGICAL HISTORY:  Past Medical History:  Diagnosis Date  . Diabetes mellitus without complication  ([place])   . Hypertension   . Prostate cancer ([place]) 09/05/2015  . Prostate cancer metastatic to bone ([place]) 09/05/2015  . Sleep apnea   . Thyroid disease    Past Surgical History:  Procedure Laterality Date  . CATARACT EXTRACTION    . PORTACATH PLACEMENT Left 12/31/2015   Procedure: INSERTION PORT-A-CATH LEFT SUBCLAVIAN;  Surgeon: Aviva Signs, MD;  Location: [facility];  Service: General;  Laterality: Left;  . REPLACEMENT TOTAL KNEE Left      SOCIAL HISTORY:  Social History   Socioeconomic History  . Marital status: Married    Spouse name: Not on file  . Number of children: Not on file  . Years of education: Not on file  . Highest education level: Not on file  Occupational History  . Not on file  Social Needs  . Financial resource strain: Not on file  . Food insecurity:    Worry: Not on file    Inability: Not on file  . Transportation needs:    Medical: Not on file    Non-medical: Not on file  Tobacco Use  . Smoking status: Never Smoker  . Smokeless tobacco: Never Used  Substance and Sexual Activity  . Alcohol use: Yes    Comment: 1 beer each month  . Drug use: No  . Sexual activity: Never    Comment: married  Lifestyle  . Physical activity:    Days per week: Not on file    Minutes per session: Not on file  . Stress: Not on file  Relationships  . Social connections:    Talks on phone: Not on file    Gets together: Not on file    Attends religious service: Not on file    Active member of club or organization: Not on file    Attends meetings of clubs or organizations: Not on file    Relationship status: Not on file  . Intimate partner violence:    Fear of current or ex partner: Not on file    Emotionally abused: Not on file    Physically abused: Not on file    Forced sexual activity: Not on file  Other Topics Concern  . Not on file  Social History Narrative  . Not on file    FAMILY HISTORY:  History reviewed. No pertinent family history.  CURRENT MEDICATIONS:   Outpatient Encounter Medications as of 09/17/2017  Medication Sig Note  . atenolol (TENORMIN) 100 MG tablet Take 100 mg by mouth daily.   Marland Kitchen atorvastatin (LIPITOR) 20 MG tablet Take 10 mg by mouth daily.   . calcium carbonate (OS-CAL - DOSED IN MG OF ELEMENTAL CALCIUM) 1250 (500 Ca) MG tablet Take 1 tablet by mouth 2 (two) times daily.    Marland Kitchen denosumab (XGEVA) 120 MG/1.7ML SOLN injection Inject 120 mg into the skin every 28 (twenty-eight) days. 05/18/2017: On hold per MD  . diltiazem (CARDIZEM CD) 300 MG 24 hr capsule Take 300 mg by mouth daily.   . DOCEtaxel (TAXOTERE IV) Inject into the vein.   Marland Kitchen gabapentin (NEURONTIN) 300 MG capsule Patient may take 1 capsule in the morning and 2 capsules @ bedtime.   . Glucosamine-Chondroit-Vit C-Mn (GLUCOSAMINE 1500 COMPLEX PO) Take 1 tablet by mouth 2 (two) times daily.    Marland Kitchen KLOR-CON M20 20 MEQ tablet TAKE 1 TABLET BY MOUTH ONCE DAILY (Patient taking differently: TAKE 20 MEQ BY MOUTH ONCE DAILY)   . leuprolide (LUPRON) 7.5 MG injection Inject 7.5 mg into the muscle every 28 (twenty-eight) days.   Marland Kitchen levothyroxine (SYNTHROID, LEVOTHROID) 25 MCG tablet Take 25 mcg by mouth daily before breakfast.   . loperamide (IMODIUM) 1 MG/5ML solution Take 1 mg by mouth as needed for diarrhea or loose stools.   . Lysine 500 MG CAPS Take 500 mg by mouth daily.   . magic mouthwash w/lidocaine SOLN 1 part of each of the following: Benadryl 12.65m /568m Viscous lidocaine 2%, Maalox. Swish and swallow 5 mL QID.   . Marland KitchenetFORMIN (GLUCOPHAGE) 500 MG tablet Take 500 mg by mouth 2 (two) times daily with a meal.   . morphine (MS CONTIN) 60 MG 12 hr tablet Take 1 tablet (60 mg total) by mouth every 12 (twelve) hours.   . Marland Kitchenorphine (MSIR) 30 MG tablet Take 1 tablet (30 mg total) by mouth every 6 (six) hours as needed for severe pain.   . Multiple Vitamin (MULTIVITAMIN WITH MINERALS) TABS tablet Take 1 tablet by mouth daily.   . Omega-3 Fatty Acids (FISH OIL PO) Take 1 capsule by mouth daily.    . ondansetron (ZOFRAN-ODT) 8 MG disintegrating tablet Take 1 tablet (8 mg total) by mouth every 8 (eight) hours as needed for nausea or vomiting.   . pegfilgrastim-cbqv (UDENYCA) 6 MG/0.6ML injection Inject 6 mg into the skin once.   . predniSONE (DELTASONE) 5 MG tablet Take 5 mg by mouth 2 (two) times daily with a meal.   . tamsulosin (FLOMAX) 0.4 MG CAPS capsule Take 0.8 mg by mouth daily.    . Marland Kitchenriamterene-hydrochlorothiazide (MAXZIDE-25) 37.5-25 MG tablet Take 1 tablet by mouth daily. for high blood pressure   . [DISCONTINUED] prochlorperazine (COMPAZINE) 10 MG tablet Take 1 tablet (10 mg total) by mouth every 6 (six) hours as needed (Nausea or vomiting). (Patient taking differently: Take 10 mg by mouth every 6 (six) hours as needed for nausea or vomiting. )    Facility-Administered Encounter Medications as of 09/17/2017  Medication  . leuprolide (LUPRON) injection 7.5 mg    ALLERGIES:  No Known Allergies   PHYSICAL EXAM:  ECOG Performance status: 1  Vitals:   09/17/17 1002 09/17/17 1003  BP: 130/71 123/61  Pulse: (!) 54   Resp: 18   Temp: (!) 97.5 F (36.4 C)   SpO2: 97%    Filed Weights   09/17/17 1002  Weight: 225 lb 3.2 oz (102.2 kg)    Physical Exam  Constitutional:  He is oriented to person, place, and time.  Cardiovascular: Normal rate, regular rhythm and normal heart sounds.  Pulmonary/Chest: Effort normal and breath sounds normal.  Neurological: He is alert and oriented to person, place, and time.  Skin: Skin is warm and dry.     LABORATORY DATA:  I have reviewed the labs as listed.  CBC    Component Value Date/Time   WBC 6.4 09/17/2017 0912   RBC 3.11 (L) 09/17/2017 0912   HGB 9.3 (L) 09/17/2017 0912   HCT 29.0 (L) 09/17/2017 0912   PLT 134 (L) 09/17/2017 0912   MCV 93.2 09/17/2017 0912   MCH 29.9 09/17/2017 0912   MCHC 32.1 09/17/2017 0912   RDW 20.3 (H) 09/17/2017 0912   LYMPHSABS 1.3 09/17/2017 0912   MONOABS 0.5 09/17/2017 0912   EOSABS 0.0  09/17/2017 0912   BASOSABS 0.1 09/17/2017 0912   CMP Latest Ref Rng & Units 09/17/2017 08/25/2017 08/06/2017  Glucose 70 - 99 mg/dL 111(H) 118(H) 182(H)  BUN 8 - 23 mg/dL '15 11 16  ' Creatinine 0.61 - 1.24 mg/dL 0.76 0.62 0.79  Sodium 135 - 145 mmol/L 138 140 138  Potassium 3.5 - 5.1 mmol/L 3.8 3.9 3.7  Chloride 98 - 111 mmol/L 103 105 105  CO2 22 - 32 mmol/L '26 26 25  ' Calcium 8.9 - 10.3 mg/dL 8.6(L) 8.2(L) 8.9  Total Protein 6.5 - 8.1 g/dL 6.3(L) 6.2(L) 6.2(L)  Total Bilirubin 0.3 - 1.2 mg/dL 0.7 0.8 0.9  Alkaline Phos 38 - 126 U/L 148(H) 160(H) 169(H)  AST 15 - 41 U/L 24 23 43(H)  ALT 0 - 44 U/L 15 15 16(L)         ASSESSMENT & PLAN:   Prostate cancer metastatic to bone ([facility]) 1.  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 - BRCA 1/2 negative, PDL 1 expression of 0% -Foundation 1 testing shows MS-stable, TMB intermediate, no other significant alterations to modify therapy. -Most recent progression on cabazitaxel.  He has used [place] in the past.  He had received 2 cycles of docetaxel in the beginning.  It is unclear whether this was discontinued secondary to progression of disease.   -Cycle 1 of docetaxel at 20% dose reduction started on 06/25/2017.  He received cycle 2 on 07/16/2017.  Cycle 3 was on 08/06/2017. -We have reviewed the results of the CT scan and bone scan.  CT scan dated 08/25/2017 was compared to scans done on 05/06/2017.  There was addition of 2 new small lesions in the liver.  The dominant lesion appears bigger due to necrosis.  As there was 6 to 8 weeks delay in starting his first cycle of chemotherapy from the scans, these lesions could have grown during that time.  However his PSA has come down from 196 to161 at cycle 3.  He received cycle 4 on 08/27/2017. - He reportedly fell one time at home.  Denies any lightheadedness.  It happened when he got up after 2 hours of sleep.  No major injuries.   We checked for orthostatics today which was within normal limits.  He will proceed with cycle 5 today.  I plan to repeat CT scan of the abdomen and pelvis and a bone scan.  PSA from today is pending.  We will change the Lupron to every 12 weeks. -he is complaining of pain in the right lower molar.  He is supposed to see [employer], [employer].  We will hold  his next dose of denosumab.  2.  Peripheral neuropathy: He has constant numbness in the feet which is stable.  This is from prior chemotherapy.  He takes gabapentin 1 tablet in the morning and 2 tablets at bedtime. This has not gotten worse after start of docetaxel.  3.  Chemotherapy-induced anemia: -He has normocytic anemia.  He received first Feraheme on 08/06/2017 without any problems.  He will proceed with his second Feraheme today.  4.  Cancer related pain: -We have refilled his morphine ER 60 mg every 12 hours.  He takes morphine IR 30 mg in between as needed.      Orders placed this encounter:  Orders Placed This Encounter  Procedures  . CT Abdomen Pelvis W Contrast  . NM Bone Scan Whole Body  . PSA  . CBC with Differential/Platelet  . Comprehensive metabolic panel  . PSA      Derek Jack, MD [place] [phone]

## 2017-09-17 NOTE — Progress Notes (Signed)
Patient seen by Dr. [name] for follow up and chemotherapy visit.  Labs reviewed and ok to treat today.  Patient will not receive XGEVA due to dental work and jaw pain.  Patient to receive feraheme and will start 3 month lupron injections after next Tuesday.   Patient tolerated chemotherapy with no complaints voiced.  Port site clean and dry with no bruising or swelling noted at site.  Band aid applied.  Neulasta Onpro applied with green indicator light flashing.  VSS with discharge and left ambulatory with family.  No s/s of distress noted.

## 2017-09-17 NOTE — Patient Instructions (Signed)
[facility] Discharge Instructions for Patients Receiving Chemotherapy   Beginning January 23rd 2017 lab work for the [facility] will be done in the  Main lab at [facility] on 1st floor. If you have a lab appointment with the [place] please come in thru the  Main Entrance and check in at the main information desk   Today you received the following chemotherapy agents Jevtana with Neulasta on-pro and Feraheme infusion. Follow-up as scheduled. Call clinic for any questions or concerns  To help prevent nausea and vomiting after your treatment, we encourage you to take your nausea medication   If you develop nausea and vomiting, or diarrhea that is not controlled by your medication, call the clinic.  The clinic phone number is [phone]. Office hours are Monday-Friday 8:30am-5:00pm.  BELOW ARE SYMPTOMS THAT SHOULD BE REPORTED IMMEDIATELY:  *FEVER GREATER THAN 101.0 F  *CHILLS WITH OR WITHOUT FEVER  NAUSEA AND VOMITING THAT IS NOT CONTROLLED WITH YOUR NAUSEA MEDICATION  *UNUSUAL SHORTNESS OF BREATH  *UNUSUAL BRUISING OR BLEEDING  TENDERNESS IN MOUTH AND THROAT WITH OR WITHOUT PRESENCE OF ULCERS  *URINARY PROBLEMS  *BOWEL PROBLEMS  UNUSUAL RASH Items with * indicate a potential emergency and should be followed up as soon as possible. If you have an emergency after office hours please contact your primary care physician or go to the nearest emergency department.  Please call the clinic during office hours if you have any questions or concerns.   You may also contact the Patient Navigator at [phone] should you have any questions or need assistance in obtaining follow up care.      Resources For Cancer Patients and their Caregivers ? American Cancer Society: Can assist with transportation, wigs, general needs, runs Look Good Feel Better.        970-405-6132 ? Cancer Care: Provides financial assistance, online support groups,  medication/co-pay assistance.  1-800-813-HOPE 7165965823) ? [place] Assists [place] cancer patients and their families through emotional , educational and financial support.  778-517-6295 ? [place] DSS Where to apply for food stamps, Medicaid and utility assistance. 208-628-2945 ? [facility]: Transportation to medical appointments. 440-299-9644 ? Social Security Administration: May apply for disability if have a Stage IV cancer. ([phone] 220-561-1692 ? [employer], Disability and Transit Services: Assists with nutrition, care and transit needs. ([phone]

## 2017-09-22 ENCOUNTER — Ambulatory Visit (HOSPITAL_COMMUNITY): Payer: Medicare Other

## 2017-09-22 ENCOUNTER — Other Ambulatory Visit (HOSPITAL_COMMUNITY): Payer: Medicare Other

## 2017-09-23 ENCOUNTER — Inpatient Hospital Stay (HOSPITAL_COMMUNITY): Payer: Medicare Other

## 2017-09-23 ENCOUNTER — Other Ambulatory Visit (HOSPITAL_COMMUNITY): Payer: Medicare Other

## 2017-09-28 ENCOUNTER — Encounter (HOSPITAL_COMMUNITY): Payer: Self-pay

## 2017-09-28 ENCOUNTER — Ambulatory Visit (HOSPITAL_COMMUNITY)
Admission: RE | Admit: 2017-09-28 | Discharge: 2017-09-28 | Disposition: A | Discharge status: Home / Self Care | Payer: Medicare Other | Source: Ambulatory Visit | Attending: Nurse Practitioner | Admitting: Nurse Practitioner

## 2017-09-28 DIAGNOSIS — C61 Malignant neoplasm of prostate: Secondary | ICD-10-CM | POA: Insufficient documentation

## 2017-09-28 DIAGNOSIS — C7951 Secondary malignant neoplasm of bone: Secondary | ICD-10-CM | POA: Insufficient documentation

## 2017-09-28 MED ORDER — TECHNETIUM TC 99M MEDRONATE IV KIT
20.0000 | PACK | Freq: Once | INTRAVENOUS | Status: AC | PRN
  Administered 2017-09-28: 20 via INTRAVENOUS

## 2017-10-06 ENCOUNTER — Other Ambulatory Visit: Payer: Self-pay

## 2017-10-06 ENCOUNTER — Inpatient Hospital Stay (HOSPITAL_COMMUNITY)
Admission: EM | Admit: 2017-10-06 | Discharge: 2017-10-09 | DRG: 175 | Disposition: A | Discharge status: Home / Self Care | Payer: Medicare Other | Attending: Internal Medicine | Admitting: Internal Medicine

## 2017-10-06 ENCOUNTER — Ambulatory Visit (HOSPITAL_COMMUNITY)
Admission: RE | Admit: 2017-10-06 | Discharge: 2017-10-06 | Disposition: A | Discharge status: Home / Self Care | Payer: Medicare Other | Source: Ambulatory Visit | Attending: Nurse Practitioner | Admitting: Nurse Practitioner

## 2017-10-06 ENCOUNTER — Encounter (HOSPITAL_COMMUNITY): Payer: Self-pay | Admitting: *Deleted

## 2017-10-06 ENCOUNTER — Emergency Department (HOSPITAL_COMMUNITY): Payer: Medicare Other

## 2017-10-06 ENCOUNTER — Other Ambulatory Visit (HOSPITAL_COMMUNITY): Payer: Self-pay | Admitting: Hematology

## 2017-10-06 ENCOUNTER — Encounter (HOSPITAL_COMMUNITY): Payer: Self-pay | Admitting: Emergency Medicine

## 2017-10-06 DIAGNOSIS — E119 Type 2 diabetes mellitus without complications: Secondary | ICD-10-CM | POA: Diagnosis not present

## 2017-10-06 DIAGNOSIS — K219 Gastro-esophageal reflux disease without esophagitis: Secondary | ICD-10-CM | POA: Diagnosis present

## 2017-10-06 DIAGNOSIS — G4730 Sleep apnea, unspecified: Secondary | ICD-10-CM | POA: Diagnosis present

## 2017-10-06 DIAGNOSIS — E1140 Type 2 diabetes mellitus with diabetic neuropathy, unspecified: Secondary | ICD-10-CM | POA: Diagnosis present

## 2017-10-06 DIAGNOSIS — Z66 Do not resuscitate: Secondary | ICD-10-CM | POA: Diagnosis present

## 2017-10-06 DIAGNOSIS — Z923 Personal history of irradiation: Secondary | ICD-10-CM | POA: Diagnosis not present

## 2017-10-06 DIAGNOSIS — Z79891 Long term (current) use of opiate analgesic: Secondary | ICD-10-CM

## 2017-10-06 DIAGNOSIS — E079 Disorder of thyroid, unspecified: Secondary | ICD-10-CM | POA: Diagnosis present

## 2017-10-06 DIAGNOSIS — S065X9A Traumatic subdural hemorrhage with loss of consciousness of unspecified duration, initial encounter: Secondary | ICD-10-CM | POA: Diagnosis present

## 2017-10-06 DIAGNOSIS — Z7952 Long term (current) use of systemic steroids: Secondary | ICD-10-CM | POA: Diagnosis not present

## 2017-10-06 DIAGNOSIS — I2699 Other pulmonary embolism without acute cor pulmonale: Secondary | ICD-10-CM | POA: Diagnosis not present

## 2017-10-06 DIAGNOSIS — C787 Secondary malignant neoplasm of liver and intrahepatic bile duct: Principal | ICD-10-CM

## 2017-10-06 DIAGNOSIS — S065XAA Traumatic subdural hemorrhage with loss of consciousness status unknown, initial encounter: Secondary | ICD-10-CM

## 2017-10-06 DIAGNOSIS — Z79890 Hormone replacement therapy (postmenopausal): Secondary | ICD-10-CM | POA: Diagnosis not present

## 2017-10-06 DIAGNOSIS — Z9221 Personal history of antineoplastic chemotherapy: Secondary | ICD-10-CM | POA: Diagnosis not present

## 2017-10-06 DIAGNOSIS — C61 Malignant neoplasm of prostate: Secondary | ICD-10-CM | POA: Diagnosis present

## 2017-10-06 DIAGNOSIS — Z7984 Long term (current) use of oral hypoglycemic drugs: Secondary | ICD-10-CM | POA: Diagnosis not present

## 2017-10-06 DIAGNOSIS — I10 Essential (primary) hypertension: Secondary | ICD-10-CM | POA: Diagnosis not present

## 2017-10-06 DIAGNOSIS — G893 Neoplasm related pain (acute) (chronic): Secondary | ICD-10-CM | POA: Diagnosis present

## 2017-10-06 DIAGNOSIS — C7951 Secondary malignant neoplasm of bone: Secondary | ICD-10-CM | POA: Diagnosis present

## 2017-10-06 DIAGNOSIS — R0602 Shortness of breath: Secondary | ICD-10-CM | POA: Diagnosis not present

## 2017-10-06 DIAGNOSIS — I2609 Other pulmonary embolism with acute cor pulmonale: Secondary | ICD-10-CM | POA: Diagnosis not present

## 2017-10-06 DIAGNOSIS — W19XXXA Unspecified fall, initial encounter: Secondary | ICD-10-CM | POA: Diagnosis present

## 2017-10-06 DIAGNOSIS — S065X0A Traumatic subdural hemorrhage without loss of consciousness, initial encounter: Secondary | ICD-10-CM | POA: Diagnosis not present

## 2017-10-06 DIAGNOSIS — Z79899 Other long term (current) drug therapy: Secondary | ICD-10-CM | POA: Diagnosis not present

## 2017-10-06 DIAGNOSIS — I6201 Nontraumatic acute subdural hemorrhage: Secondary | ICD-10-CM | POA: Diagnosis not present

## 2017-10-06 DIAGNOSIS — Z5111 Encounter for antineoplastic chemotherapy: Secondary | ICD-10-CM | POA: Diagnosis not present

## 2017-10-06 DIAGNOSIS — I6200 Nontraumatic subdural hemorrhage, unspecified: Secondary | ICD-10-CM | POA: Diagnosis not present

## 2017-10-06 LAB — CBC WITH DIFFERENTIAL/PLATELET
Basophils Absolute: 0 10*3/uL (ref 0.0–0.1)
Basophils Relative: 0 %
Eosinophils Absolute: 0 10*3/uL (ref 0.0–0.7)
Eosinophils Relative: 0 %
HCT: 27.3 % — ABNORMAL LOW (ref 39.0–52.0)
Hemoglobin: 8.6 g/dL — ABNORMAL LOW (ref 13.0–17.0)
LYMPHS ABS: 0.8 10*3/uL (ref 0.7–4.0)
LYMPHS PCT: 11 %
MCH: 30.5 pg (ref 26.0–34.0)
MCHC: 31.5 g/dL (ref 30.0–36.0)
MCV: 96.8 fL (ref 78.0–100.0)
MONOS PCT: 6 %
Monocytes Absolute: 0.4 10*3/uL (ref 0.1–1.0)
NEUTROS ABS: 5.5 10*3/uL (ref 1.7–7.7)
Neutrophils Relative %: 83 %
Platelets: 133 10*3/uL — ABNORMAL LOW (ref 150–400)
RBC: 2.82 MIL/uL — ABNORMAL LOW (ref 4.22–5.81)
RDW: 19.6 % — ABNORMAL HIGH (ref 11.5–15.5)
WBC: 6.7 10*3/uL (ref 4.0–10.5)

## 2017-10-06 LAB — COMPREHENSIVE METABOLIC PANEL
ALT: 15 U/L (ref 0–44)
AST: 38 U/L (ref 15–41)
Albumin: 3.4 g/dL — ABNORMAL LOW (ref 3.5–5.0)
Alkaline Phosphatase: 136 U/L — ABNORMAL HIGH (ref 38–126)
Anion gap: 9 (ref 5–15)
BILIRUBIN TOTAL: 0.7 mg/dL (ref 0.3–1.2)
BUN: 15 mg/dL (ref 8–23)
CO2: 25 mmol/L (ref 22–32)
Calcium: 8.7 mg/dL — ABNORMAL LOW (ref 8.9–10.3)
Chloride: 102 mmol/L (ref 98–111)
Creatinine, Ser: 0.83 mg/dL (ref 0.61–1.24)
GFR calc Af Amer: >60 mL/min (ref >60)
Glucose, Bld: 172 mg/dL — ABNORMAL HIGH (ref 70–99)
Potassium: 4.1 mmol/L (ref 3.5–5.1)
Sodium: 136 mmol/L (ref 135–145)
TOTAL PROTEIN: 6.1 g/dL — AB (ref 6.5–8.1)

## 2017-10-06 LAB — TROPONIN I
Troponin I: <0.03 ng/mL (ref <0.03)
Troponin I: <0.03 ng/mL (ref <0.03)

## 2017-10-06 LAB — PROTIME-INR
INR: 1
PROTHROMBIN TIME: 13.1 s (ref 11.4–15.2)

## 2017-10-06 LAB — BRAIN NATRIURETIC PEPTIDE: B NATRIURETIC PEPTIDE 5: 223 pg/mL — AB (ref 0.0–100.0)

## 2017-10-06 MED ORDER — ONDANSETRON HCL 4 MG/2ML IJ SOLN: 4.0000 mg | Freq: Four times a day (QID) | INTRAMUSCULAR | Status: DC | PRN

## 2017-10-06 MED ORDER — ACETAMINOPHEN 650 MG RE SUPP: 650.0000 mg | Freq: Four times a day (QID) | RECTAL | Status: DC | PRN

## 2017-10-06 MED ORDER — IOPAMIDOL (ISOVUE-300) INJECTION 61%
100.0000 mL | Freq: Once | INTRAVENOUS | Status: AC | PRN
  Administered 2017-10-06: 100 mL via INTRAVENOUS

## 2017-10-06 MED ORDER — HEPARIN BOLUS VIA INFUSION
4000.0000 [IU] | Freq: Once | INTRAVENOUS | Status: AC
  Administered 2017-10-06: 4000 [IU] via INTRAVENOUS

## 2017-10-06 MED ORDER — ONDANSETRON HCL 4 MG PO TABS: 4.0000 mg | ORAL_TABLET | Freq: Four times a day (QID) | ORAL | Status: DC | PRN

## 2017-10-06 MED ORDER — ACETAMINOPHEN 325 MG PO TABS: 650.0000 mg | ORAL_TABLET | Freq: Four times a day (QID) | ORAL | Status: DC | PRN

## 2017-10-06 MED ORDER — HEPARIN (PORCINE) IN NACL 100-0.45 UNIT/ML-% IJ SOLN
1500.0000 [IU]/h | INTRAMUSCULAR | Status: DC
Start: 2017-10-06 — End: 2017-10-09
  Administered 2017-10-06 – 2017-10-07 (×2): 1500 [IU]/h via INTRAVENOUS
  Filled 2017-10-06 (×4): qty 250

## 2017-10-06 NOTE — Progress Notes (Signed)
ANTICOAGULATION CONSULT NOTE - Follow Up Consult  Pharmacy Consult for Heparin Indication: pulmonary embolus  No Known Allergies  Patient Measurements: Height: 5\' 10"  (177.8 cm) Weight: 225 lb (102.1 kg) IBW/kg (Calculated) : 73 HEPARIN DW (KG): 94.5  Vital Signs: Temp Source: Oral (08/06 1644) BP: 97/53 (08/06 1930) Pulse Rate: 52 (08/06 1930)  Labs: Recent Labs    10/06/17 1705  HGB 8.6*  HCT 27.3*  PLT 133*  LABPROT 13.1  INR 1.00  CREATININE 0.83  TROPONINI <0.03    Estimated Creatinine Clearance: 96.3 mL/min (by C-G formula based on SCr of 0.83 mg/dL).   Medications:  See med rec  Assessment: 72 yo male presented with worsening shortness of breath. CT scan done today for staging purposes related to metastatic prostate cancer which showed stable disease but did show a right lower lobe PE with right heart strain. Pharmacy asked to start heparin.  Goal of Therapy:  Heparin level 0.3-0.7 units/ml Monitor platelets by anticoagulation protocol: Yes   Plan:  Give 4000 units bolus x 1 Start heparin infusion at 1500 units/hr Check anti-Xa level in 6-8 hours and daily while on heparin Continue to monitor H&H and platelets  Isac Sarna, BS Vena [place], BCPS Clinical Pharmacist Pager [phone] 10/06/2017,7:52 PM

## 2017-10-06 NOTE — Progress Notes (Signed)
Notified by Dr. [name] that radiology called on patient and reported a pulmonary embolism that requires immediate attention.  If patient is stable he can drive to the emergency room, if patient is not stable he is to call EMS.  I have attempted to contact both patient and his wife.    1600 they returned my call. Patient is not having any difficulty breathing.  He is currently having pain in both arms and shoulders. Patient reports having a difficult night.  Patient states that he doesn't want to come via EMS so he will drive himself.  I advised patient that he needs to report to the ER urgently.  He verbalizes understanding.

## 2017-10-06 NOTE — ED Notes (Signed)
[facility] arrived for Pt transportation to [facility]

## 2017-10-06 NOTE — ED Triage Notes (Signed)
Pt hx prostate cancer and currently taking chemo every 21 days. Due this Friday. Obtained CT scan today and was called back to come to ED due to PE. Pt denies any increased SOB than usual. Pt ambulatory with cane. Nad. C/o chronic back pain

## 2017-10-06 NOTE — ED Provider Notes (Signed)
Emergency Department Provider Note   I have reviewed the triage vital signs and the nursing notes.   HISTORY  Chief Complaint Other (PE)   HPI Micheal Davis is a 72 y.o. male with multiple medical problems documented below the presents the emergency department today secondary to pulmonary embolus seen on CT scan.  Patient states he had worsening shortness of breath over the last month and has had a couple falls as well related to weakness.  He had gotten a CT scan today for staging purposes secondary to metastatic prostate cancer which showed stable disease but did show a right lower lobe pulmonary embolus with right heart strain and was sent here for further evaluation.  Is no chest pain.  States he does not know if he has any metastasis to his brain as he is never had a scan of the same.  Has had a couple falls recently where he did hit his head has some generalized weakness and lower extremity pain but no focal neurologic deficits. No other associated or modifying symptoms.    Past Medical History:  Diagnosis Date  . Diabetes mellitus without complication ([place])   . Hypertension   . Prostate cancer ([place]) 09/05/2015  . 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 Sleep apnea   . Thyroid disease     Patient Active Problem List   Diagnosis Date Noted  . Acute pulmonary embolism ([place]) 10/06/2017  . Sleep apnea 10/06/2017  . Hypertension 10/06/2017  . Thyroid disease 10/06/2017  . Diabetes mellitus without complication ([place]) 16/12/9602  . Gastroesophageal reflux disease 03/13/2016  . 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 Nausea 08/21/2015  . SOB (shortness of breath) on exertion 08/21/2015  . 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 Osteoarthritis of right knee 01/16/2014    Past Surgical History:  Procedure Laterality Date  . CATARACT EXTRACTION    . PORTACATH PLACEMENT Left 12/31/2015     Procedure: INSERTION PORT-A-CATH LEFT SUBCLAVIAN;  Surgeon: Aviva Signs, MD;  Location: [facility];  Service: General;  Laterality: Left;  . REPLACEMENT TOTAL KNEE Left     Current Outpatient Rx  . Order #: [id number] Class: Historical Med  . Order #: [id number] Class: Historical Med  . Order #: [id number] Class: Historical Med  . Order #: [id number] Class: Historical Med  . Order #: [id number] Class: Historical Med  . Order #: [id number] Class: Normal  . Order #: [id number] Class: Historical Med  . Order #: [id number] Class: Normal  . Order #: [id number] Class: Historical Med  . Order #: [id number] Class: Historical Med  . Order #: [id number] Class: Historical Med  . Order #: [id number] Class: Historical Med  . Order #: [id number] Class: Print  . Order #: [id number] Class: Normal  . Order #: [id number] Class: Normal  . Order #: [id number] Class: Historical Med  . Order #: [id number] Class: Historical Med  . Order #: [id number] Class: Normal  . Order #: [id number] Class: Historical Med  . Order #: [id number] Class: Historical Med  . Order #: [id number] Class: Historical Med  . Order #: [id number] Class: Historical Med  . Order #: [id number] Class: Historical Med  . Order #: [id number] Class: Historical Med    Allergies Patient has no active allergies.  Family History  Problem Relation Age of Onset  . Renal Disease Father   . Vascular Disease Father        Carotid artery disease  . Hypertension Brother     Social History Social History   Tobacco Use  . Smoking  status: Never Smoker  . Smokeless tobacco: Never Used  Substance Use Topics  . Alcohol use: Yes    Comment: 1 beer each month  . Drug use: No    Review of Systems  All other systems negative except as documented in the HPI. All pertinent positives and negatives as reviewed in the HPI. ____________________________________________   PHYSICAL EXAM:  VITAL SIGNS: Blood pressure (!) 148/68, pulse (!) 58, resp. rate 19, SpO2 95 %.   Constitutional: Alert and  oriented. Well appearing and in no acute distress. Eyes: Conjunctivae are normal. PERRL. EOMI. Head: Atraumatic. Nose: No congestion/rhinnorhea. Mouth/Throat: Mucous membranes are moist.  Oropharynx non-erythematous. Neck: No stridor.  No meningeal signs.   Cardiovascular: Normal rate, regular rhythm. Good peripheral circulation. Grossly normal heart sounds.   Respiratory: Normal respiratory effort.  No retractions. Lungs CTAB. Gastrointestinal: Soft and nontender. No distention.  Musculoskeletal: No lower extremity tenderness nor edema. No gross deformities of extremities. Neurologic:  Normal speech and language. No gross focal neurologic deficits are appreciated.  Skin:  Skin is warm, dry and intact. No rash noted.   ____________________________________________   LABS (all labs ordered are listed, but only abnormal results are displayed)  Labs Reviewed  CBC WITH DIFFERENTIAL/PLATELET - Abnormal; Notable for the following components:      Result Value   RBC 2.82 (*)    Hemoglobin 8.6 (*)    HCT 27.3 (*)    RDW 19.6 (*)    Platelets 133 (*)    All other components within normal limits  COMPREHENSIVE METABOLIC PANEL - Abnormal; Notable for the following components:   Glucose, Bld 172 (*)    Calcium 8.7 (*)    Total Protein 6.1 (*)    Albumin 3.4 (*)    Alkaline Phosphatase 136 (*)    All other components within normal limits  BRAIN NATRIURETIC PEPTIDE - Abnormal; Notable for the following components:   B Natriuretic Peptide 223.0 (*)    All other components within normal limits  PROTIME-INR  TROPONIN I  TROPONIN I  HEPARIN LEVEL (UNFRACTIONATED)  CBC  CBC WITH DIFFERENTIAL/PLATELET  BASIC METABOLIC PANEL  TROPONIN I  TROPONIN I   ____________________________________________  EKG   EKG Interpretation  Date/Time:    Ventricular Rate:    PR Interval:    QRS Duration:   QT Interval:    QTC Calculation:   R Axis:     Text Interpretation:          ____________________________________________  RADIOLOGY  Ct Head Wo Contrast  Result Date: 10/06/2017 CLINICAL DATA:  History prostate cancer on chemotherapy currently. Pulmonary embolism today. EXAM: CT HEAD WITHOUT CONTRAST TECHNIQUE: Contiguous axial images were obtained from the base of the skull through the vertex without intravenous contrast. COMPARISON:  10/20/2012 FINDINGS: Brain: Ventricles and cisterns are within normal. There is a small right convexity subdural hematoma likely subacute in nature measuring 7 mm in thickness over the frontal region just above the level of the lateral ventricles. Very subtle local mass effect and no significant midline shift. No evidence of mass. No acute infarction. Vascular: No hyperdense vessel or unexpected calcification. Skull: Normal. Negative for fracture or focal lesion. Sinuses/Orbits: Orbits are within normal. There is opacification of the floor the right maxillary sinus likely chronic inflammatory change. Other: None. IMPRESSION: Small right convexity subdural hematoma likely subacute in nature with minimal local mass effect, but no significant midline shift. This measures 7 mm in thickness. Critical Value/emergent results were called by telephone at the  time of interpretation on 10/06/2017 at 6:12 pm to Dr. Merrily Davis , who verbally acknowledged these results. Electronically Signed   By: Marin Olp M.D.   On: 10/06/2017 18:12   Ct Abdomen Pelvis W Contrast  Result Date: 10/06/2017 CLINICAL DATA:  Restaging metastatic prostate cancer with ongoing chemotherapy. Metastatic disease to the liver and skeleton. Low back pain and bilateral leg/hip pain. EXAM: CT ABDOMEN AND PELVIS WITH CONTRAST TECHNIQUE: Multidetector CT imaging of the abdomen and pelvis was performed using the standard protocol following bolus administration of intravenous contrast. CONTRAST:  124mL ISOVUE-300 IOPAMIDOL (ISOVUE-300) INJECTION 61% COMPARISON:  Multiple exams, including  08/25/2017 FINDINGS: Lower chest: Mild cardiomegaly. Scarring anteriorly in the right lower lobe. Atherosclerotic calcification of the descending thoracic aorta. There is abnormal hypodensity and expansion of right lower lobe pulmonary arterial branches compatible with acute pulmonary embolus. Right ventricular to left ventricular ratio 1.0. Small amount of contrast in the distal esophagus. Hepatobiliary: Increased central necrosis and an index 5.1 by 5.5 cm lesion primarily in segment 4 of the liver on image 19/2, previously 5.2 by 5.5 cm. The other liver lesions appear stable from 08/25/2017. No definite new lesions, although some of the lesions are isodense to the liver and thus difficult to discern. Pancreas: Unremarkable Spleen: Unremarkable Adrenals/Urinary Tract: Stable right kidney upper pole fluid density lesion favoring cyst. Small left mid kidney lesion posterolaterally is stable and technically nonspecific due to small size. Adrenal glands unremarkable. Stomach/Bowel: Unremarkable Vascular/Lymphatic: Aortoiliac atherosclerotic vascular disease. No pathologic adenopathy in the abdomen/pelvis. Reproductive: The prostate gland is not enlarged and has a stable contour without asymmetry. Seminal vesicles do not appear asymmetric. Other: No supplemental non-categorized findings. Musculoskeletal: Diffuse sclerosis of the skeleton compatible with prior widespread osseous metastatic disease. 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 Bilateral foraminal impingement at L5-S1. Lumbar spondylosis and degenerative disc disease noted with additional impingement suspected at L3-4 and possibly L2-3. Old healed left posterior rib fractures. IMPRESSION: 1. Acute pulmonary embolus in the visualized portion of the right lower lobe. Positive for acute PE with CT evidence of right heart strain (RV/LV Ratio = 1.0) consistent with at least submassive (intermediate risk) PE. The presence of right heart  strain has been associated with an increased risk of morbidity and mortality. The patient should be transported to the emergency room, preferably by EMS. Treating personnel: Please activate Code PE by paging [phone]. 2. Similar appearance of scattered hepatic metastatic lesions. Although unchanged in size, a dominant lesion primarily in segment 4 of the liver does demonstrate some increase in central necrosis compared to previous. 3. Diffuse skeletal sclerosis compatible with prior widespread osseous metastatic disease. 4. Other imaging findings of potential clinical significance: Aortic Atherosclerosis (ICD10-I70.0). Mild cardiomegaly. 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 Foraminal impingement at L5-S1 and L3-4, and possibly L2-3. Critical Value/emergent results were called by telephone at the time of interpretation on 10/06/2017 at 3:45 pm to Micheal Davis, who verbally acknowledged these results. Electronically Signed   By: Van Clines M.D.   On: 10/06/2017 15:46    ____________________________________________   PROCEDURES  Procedure(s) performed:   Procedures  CRITICAL CARE Performed by: Micheal Davis Total critical care time: 35 minutes Critical care time was exclusive of separately billable procedures and treating other patients. Critical care was necessary to treat or prevent imminent or life-threatening deterioration. Critical care was time spent personally by me on the following activities: development of treatment plan with patient and/or  surrogate as well as nursing, discussions with consultants, evaluation of patient's response to treatment, examination of patient, obtaining history from patient or surrogate, ordering and performing treatments and interventions, ordering and review of laboratory studies, ordering and review of radiographic studies, pulse oximetry and re-evaluation of patient's  condition.  ____________________________________________   INITIAL IMPRESSION / ASSESSMENT AND PLAN / ED COURSE  PE with right heart strain so is likely high risk (PESI of 112=high risk) for discharge.  Will get troponin, ECG and BNP discussed with critical care as per protocol and admit to medicine.  CT of his head prior to start anticoagulants to evaluate for any evidence of metastatic disease.  CT head showed subdural from one of recent falls.  Discussed with Micheal Davis with Neurosurgery who stated patient did not need intervention and felt that anticoagulants should be started for life threatening condition of PE.  Discussed with Micheal Davis with CCM who stated same, but would likely not be a long term anticoagulant candidate but they would help manage if needed and could be consulted after transfer to [facility] if needed.  Discussed with Micheal Davis who will admit to stepdown at [facility]. Heparin started. Stable.      Pertinent labs & imaging results that were available during my care of the patient were reviewed by me and considered in my medical decision making (see chart for details).  ____________________________________________  FINAL CLINICAL IMPRESSION(S) / ED DIAGNOSES  Final diagnoses:  Acute pulmonary embolism without acute cor pulmonale, unspecified pulmonary embolism type (HCC)  Subdural hematoma ([place])     MEDICATIONS GIVEN DURING THIS VISIT:  Medications  heparin bolus via infusion 4,000 Units (4,000 Units Intravenous Bolus from Bag 10/06/17 2038)    Followed by  heparin ADULT infusion 100 units/mL (25000 units/265mL sodium chloride 0.45%) (1,500 Units/hr Intravenous New Bag/Given 10/06/17 2038)  ondansetron (ZOFRAN) tablet 4 mg (has no administration in time range)    Or  ondansetron (ZOFRAN) injection 4 mg (has no administration in time range)  acetaminophen (TYLENOL) tablet 650 mg (has no administration in time range)    Or  acetaminophen (TYLENOL) suppository 650 mg (has  no administration in time range)     NEW OUTPATIENT MEDICATIONS STARTED DURING THIS VISIT:  New Prescriptions   No medications on file    Note:  This note was prepared with assistance of Dragon voice recognition software. Occasional wrong-word or sound-a-like substitutions may have occurred due to the inherent limitations of voice recognition software.   Avenir Lozinski, Corene Cornea, MD 10/07/17 0001

## 2017-10-06 NOTE — H&P (Addendum)
History and Physical    KALIN KYLER ESP:[id number] DOB: 1945-10-03 DOA: 10/06/2017  PCP: [name], MD   Patient coming from: Home.  I have personally briefly reviewed patient's old medical records in [place]  Chief Complaint: Abnormal CT result.  HPI: Micheal Davis is a 72 y.o. 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  Denies chest pain, palpitations, dizziness, dyspnea, diaphoresis, PND, orthopnea or pitting edema of the lower extremities.  Denies fever, chills, but feels tired easily.  No sore throat, hemoptysis or wheezing.  He denies abdominal pain, nausea, emesis, diarrhea, constipation, melena or hematochezia.  No dysuria, frequency or hematuria.  He denies pruritus.  No heat or cold intolerance.  Denies polyuria, polydipsia, polyphagia or blurred vision.  ED Course: Initial vital signs are 98 F, pulse 58, respirations 19, blood pressure 140/68 O2 sat 95% on room air.  Patient was placed on oxygen and started on heparin infusion.  Lab work shows a white count of 6.7, hemoglobin 8.6 g/dL and platelets 133.  PT 13.1 seconds and INR 1.00.  Sodium 136, potassium 4.1, chloride 102 and CO2 25 mmol/L.  BUN was 15, creatinine 0.83, glucose 172 and calcium 8.7 mg/dL.  Albumin was 3.4 and total protein 6.1 g/dL.  Alkaline phosphatase was slightly elevated at 136 units/L, but all other liver enzymes and bilirubin were within normal limits.  Troponin so far has been negative x2.  BNP was 223.0 pg/mL  Review of Systems: As per HPI otherwise 10 point review of systems negative.   Past Medical History:  Diagnosis Date  . Diabetes mellitus without complication ([place])   . Hypertension   . Prostate cancer ([place]) 09/05/2015    . Prostate cancer metastatic to bone ([place]) 09/05/2015  . Sleep apnea   . Thyroid disease     Past Surgical History:  Procedure Laterality Date  . CATARACT EXTRACTION    . PORTACATH PLACEMENT Left 12/31/2015   Procedure: INSERTION PORT-A-CATH LEFT SUBCLAVIAN;  Surgeon: Aviva Signs, MD;  Location: [facility];  Service: General;  Laterality: Left;  . REPLACEMENT TOTAL KNEE Left      reports that he has never smoked. He has never used smokeless tobacco. He reports that he drinks alcohol. He reports that he does not use drugs.  No Active Allergies  Family History  Problem Relation Age of Onset  . Renal Disease Father   . Vascular Disease Father        Carotid artery disease  . Hypertension Brother     Prior to Admission medications   Medication Sig Start Date End Date Taking? Authorizing Provider  atenolol (TENORMIN) 100 MG tablet Take 100 mg by mouth daily.   Yes [provider]  atorvastatin (LIPITOR) 20 MG tablet Take 10 mg by mouth every morning.    Yes [provider]  calcium carbonate (OS-CAL - DOSED IN MG OF ELEMENTAL CALCIUM) 1250 (500 Ca) MG tablet Take 1 tablet by mouth 2 (two) times daily.    Yes [provider]  diltiazem (CARDIZEM CD) 300 MG 24 hr capsule Take 300 mg by mouth daily.   Yes [provider]  DOCEtaxel (TAXOTERE IV) Inject into the vein every 21 ( twenty-one) days.    Yes [provider]  gabapentin (NEURONTIN) 300 MG capsule Patient may take 1 capsule in the morning and 2 capsules @ bedtime. Patient taking differently: Take 300-600 mg by mouth See admin instructions. Patient may take 1 capsule in the morning and 2 capsules @ bedtime. 07/07/17  Yes Holley Bouche, NP  Glucosamine-Chondroit-Vit C-Mn (GLUCOSAMINE 1500 COMPLEX PO) Take 1 tablet by mouth 2 (two) times daily.    Yes [provider]  KLOR-CON M20 20 MEQ tablet TAKE 1 TABLET BY MOUTH ONCE DAILY Patient taking differently: TAKE 20 MEQ BY MOUTH ONCE  DAILY 04/19/17  Yes Holley Bouche, NP  leuprolide (LUPRON) 7.5 MG injection Inject 7.5 mg into the muscle every 28 (twenty-eight) days.   Yes [provider]  levothyroxine (SYNTHROID, LEVOTHROID) 25 MCG tablet Take 25 mcg by mouth daily before breakfast.   Yes [provider]  loperamide (IMODIUM) 1 MG/5ML solution Take 1 mg by mouth as needed for diarrhea or loose stools.   Yes [provider]  Lysine 500 MG CAPS Take 500 mg by mouth daily.   Yes [provider]  magic mouthwash w/lidocaine SOLN 1 part of each of the following: Benadryl 12.5mg  /39ml, Viscous lidocaine 2%, Maalox. Swish and swallow 5 mL QID. Patient taking differently: Take 5 mLs by mouth 4 (four) times daily. 1 part of each of the following: Benadryl 12.5mg  /70ml, Viscous lidocaine 2%, Maalox. Swish and swallow 5 mL QID. 07/31/16  Yes Twana First, MD  morphine (MS CONTIN) 60 MG 12 hr tablet Take 1 tablet (60 mg total) by mouth every 12 (twelve) hours. 09/04/17  Yes Lockamy, Randi L, NP-C  morphine (MSIR) 30 MG tablet Take 1 tablet (30 mg total) by mouth every 6 (six) hours as needed for severe pain. 09/02/17  Yes Derek Jack, MD  Multiple Vitamin (MULTIVITAMIN WITH MINERALS) TABS tablet Take 1 tablet by mouth daily.   Yes [provider]  Omega-3 Fatty Acids (FISH OIL PO) Take 1 capsule by mouth daily. 03/21/16  Yes [provider]  ondansetron (ZOFRAN-ODT) 8 MG disintegrating tablet Take 1 tablet (8 mg total) by mouth every 8 (eight) hours as needed for nausea or vomiting. 04/30/17  Yes Holley Bouche, NP  pegfilgrastim-cbqv [facility]) 6 MG/0.6ML injection Inject 6 mg into the skin once.   Yes [provider]  predniSONE (DELTASONE) 5 MG tablet Take 5 mg by mouth daily.    Yes [provider]  tamsulosin (FLOMAX) 0.4 MG CAPS capsule Take 0.8 mg by mouth every morning.    Yes [provider]  triamterene-hydrochlorothiazide (MAXZIDE-25) 37.5-25  MG tablet Take 1 tablet by mouth daily. for high blood pressure 03/25/16  Yes [provider]  denosumab (XGEVA) 120 MG/1.7ML SOLN injection Inject 120 mg into the skin every 28 (twenty-eight) days.    [provider]  metFORMIN (GLUCOPHAGE) 500 MG tablet Take 500 mg by mouth 2 (two) times daily with a meal.    [provider]  prochlorperazine (COMPAZINE) 10 MG tablet Take 1 tablet (10 mg total) by mouth every 6 (six) hours as needed (Nausea or vomiting). Patient taking differently: Take 10 mg by mouth every 6 (six) hours as needed for nausea or vomiting.  01/16/16 08/27/17  Patrici Ranks, MD    Physical Exam: Vitals:   10/06/17 2030 10/06/17 2100 10/06/17 2130 10/06/17 2200  BP: 121/64 128/70 (!) 116/54 (!) 130/96  Pulse: (!) 53 (!) 58 (!) 58 (!) 51  Resp: 17 15 14  (!) 27  TempSrc:      SpO2: 97% 99% 98% 95%  Weight:      Height:        Constitutional: NAD, calm, comfortable Eyes: PERRL, lids and conjunctivae normal ENMT: Mucous membranes are moist. Posterior pharynx clear of any exudate or lesions. Neck: normal, supple, no masses, no thyromegaly Respiratory: clear to auscultation bilaterally, no wheezing, no crackles. Normal respiratory effort. No accessory muscle use.  Cardiovascular: Bradycardic at 58 bpm, no murmurs / rubs / gallops. No extremity edema. 2+ pedal pulses. No carotid bruits.  Abdomen: no tenderness, no masses palpated. No hepatosplenomegaly. Bowel sounds positive.  Musculoskeletal: no clubbing / cyanosis. Good ROM, no contractures. Normal muscle tone.  Skin: Ecchymoses on both upper extremities.  See pictures below. Neurologic: CN 2-12 grossly intact. Sensation intact, DTR normal. Strength 5/5 in all 4.  Psychiatric: Normal judgment and insight. Alert and oriented x 4. Normal mood.        Labs on Admission: I have personally reviewed following labs and imaging studies  CBC: Recent Labs  Lab 10/06/17 1705  WBC 6.7  NEUTROABS  5.5  HGB 8.6*  HCT 27.3*  MCV 96.8  PLT 678*   Basic Metabolic Panel: Recent Labs  Lab 10/06/17 1705  NA 136  K 4.1  CL 102  CO2 25  GLUCOSE 172*  BUN 15  CREATININE 0.83  CALCIUM 8.7*   GFR: Estimated Creatinine Clearance: 96.3 mL/min (by C-G formula based on SCr of 0.83 mg/dL). Liver Function Tests: Recent Labs  Lab 10/06/17 1705  AST 38  ALT 15  ALKPHOS 136*  BILITOT 0.7  PROT 6.1*  ALBUMIN 3.4*   No results for input(s): LIPASE, AMYLASE in the last 168 hours. No results for input(s): AMMONIA in the last 168 hours. Coagulation Profile: Recent Labs  Lab 10/06/17 1705  INR 1.00   Cardiac Enzymes: Recent Labs  Lab 10/06/17 1705  TROPONINI <0.03   BNP (last 3 results) No results for input(s): PROBNP in the last 8760 hours. HbA1C: No results for input(s): HGBA1C in the last 72 hours. CBG: No results for input(s): GLUCAP in the last 168 hours. Lipid Profile: No results for input(s): CHOL, HDL, LDLCALC, TRIG, CHOLHDL, LDLDIRECT in the last 72 hours. Thyroid Function Tests: No results for input(s): TSH, T4TOTAL, FREET4, T3FREE, THYROIDAB in the last 72 hours. Anemia Panel: No results for input(s): VITAMINB12, FOLATE, FERRITIN, TIBC, IRON, RETICCTPCT in the last 72 hours. Urine analysis:    Component Value Date/Time   COLORURINE YELLOW 09/27/2010 1055   APPEARANCEUR CLOUDY (A) 09/27/2010 1055   LABSPEC 1.016 09/27/2010 1055   PHURINE 7.5 09/27/2010 1055   GLUCOSEU NEGATIVE 09/27/2010 1055   HGBUR NEGATIVE 09/27/2010 1055   BILIRUBINUR NEGATIVE 09/27/2010 1055   KETONESUR NEGATIVE 09/27/2010 1055   PROTEINUR NEGATIVE 09/27/2010 1055   UROBILINOGEN 1.0 09/27/2010 1055   NITRITE NEGATIVE 09/27/2010 1055   LEUKOCYTESUR NEGATIVE 09/27/2010 1055    Radiological Exams on Admission: Ct Head Wo Contrast  Result Date: 10/06/2017 CLINICAL DATA:  History prostate cancer on chemotherapy currently. Pulmonary embolism today. EXAM: CT HEAD WITHOUT CONTRAST  TECHNIQUE: Contiguous axial images were obtained from the base of the skull through the vertex without intravenous contrast. COMPARISON:  10/20/2012 FINDINGS: Brain: Ventricles and cisterns are within normal. There is a small right convexity subdural hematoma likely subacute in nature measuring 7 mm in thickness over the frontal region just above the level of the lateral ventricles. Very subtle local mass effect and no significant midline shift. No  evidence of mass. No acute infarction. Vascular: No hyperdense vessel or unexpected calcification. Skull: Normal. Negative for fracture or focal lesion. Sinuses/Orbits: Orbits are within normal. There is opacification of the floor the right maxillary sinus likely chronic inflammatory change. Other: None. IMPRESSION: Small right convexity subdural hematoma likely subacute in nature with minimal local mass effect, but no significant midline shift. This measures 7 mm in thickness. Critical Value/emergent results were called by telephone at the time of interpretation on 10/06/2017 at 6:12 pm to Dr. Merrily Pew , who verbally acknowledged these results. Electronically Signed   By: Marin Olp M.D.   On: 10/06/2017 18:12   Ct Abdomen Pelvis W Contrast  Result Date: 10/06/2017 CLINICAL DATA:  Restaging metastatic prostate cancer with ongoing chemotherapy. Metastatic disease to the liver and skeleton. Low back pain and bilateral leg/hip pain. EXAM: CT ABDOMEN AND PELVIS WITH CONTRAST TECHNIQUE: Multidetector CT imaging of the abdomen and pelvis was performed using the standard protocol following bolus administration of intravenous contrast. CONTRAST:  154mL ISOVUE-300 IOPAMIDOL (ISOVUE-300) INJECTION 61% COMPARISON:  Multiple exams, including 08/25/2017 FINDINGS: Lower chest: Mild cardiomegaly. Scarring anteriorly in the right lower lobe. Atherosclerotic calcification of the descending thoracic aorta. There is abnormal hypodensity and expansion of right lower lobe pulmonary  arterial branches compatible with acute pulmonary embolus. Right ventricular to left ventricular ratio 1.0. Small amount of contrast in the distal esophagus. Hepatobiliary: Increased central necrosis and an index 5.1 by 5.5 cm lesion primarily in segment 4 of the liver on image 19/2, previously 5.2 by 5.5 cm. The other liver lesions appear stable from 08/25/2017. No definite new lesions, although some of the lesions are isodense to the liver and thus difficult to discern. Pancreas: Unremarkable Spleen: Unremarkable Adrenals/Urinary Tract: Stable right kidney upper pole fluid density lesion favoring cyst. Small left mid kidney lesion posterolaterally is stable and technically nonspecific due to small size. Adrenal glands unremarkable. Stomach/Bowel: Unremarkable Vascular/Lymphatic: Aortoiliac atherosclerotic vascular disease. No pathologic adenopathy in the abdomen/pelvis. Reproductive: The prostate gland is not enlarged and has a stable contour without asymmetry. Seminal vesicles do not appear asymmetric. Other: No supplemental non-categorized findings. Musculoskeletal: Diffuse sclerosis of the skeleton compatible with prior widespread osseous metastatic disease. 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 Bilateral foraminal impingement at L5-S1. Lumbar spondylosis and degenerative disc disease noted with additional impingement suspected at L3-4 and possibly L2-3. Old healed left posterior rib fractures. IMPRESSION: 1. Acute pulmonary embolus in the visualized portion of the right lower lobe. Positive for acute PE with CT evidence of right heart strain (RV/LV Ratio = 1.0) consistent with at least submassive (intermediate risk) PE. The presence of right heart strain has been associated with an increased risk of morbidity and mortality. The patient should be transported to the emergency room, preferably by EMS. Treating personnel: Please activate Code PE by paging [phone]. 2. Similar  appearance of scattered hepatic metastatic lesions. Although unchanged in size, a dominant lesion primarily in segment 4 of the liver does demonstrate some increase in central necrosis compared to previous. 3. Diffuse skeletal sclerosis compatible with prior widespread osseous metastatic disease. 4. Other imaging findings of potential clinical significance: Aortic Atherosclerosis (ICD10-I70.0). Mild cardiomegaly. 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 Foraminal impingement at L5-S1 and L3-4, and possibly L2-3. Critical Value/emergent results were called by telephone at the time of interpretation on 10/06/2017 at 3:45 pm to Dr. Tera Helper, who verbally acknowledged these results. Electronically Signed   By: Thayer Jew  Janeece Fitting M.D.   On: 10/06/2017 15:46    EKG: Independently reviewed.  Vent. rate 54 BPM PR interval * ms QRS duration 94 ms QT/QTc 417/396 ms P-R-T axes 24 11 9  Sinus rhythm Low voltage, precordial leads  Assessment/Plan Principal Problem:   Acute pulmonary embolism ([place])  Admit to [facility]/stepdown. Continue supplemental oxygen. Continue heparin infusion. Trend troponin levels. Check echocardiogram. Pulmonology consult in the morning.  Active Problems:   Subdural hematoma [facility]) Discussed with PCCM and neurosurgery by Dr. Dayna Barker. After considering risks and benefits with them, heparin was started by Dr. Dayna Barker. This risk was discussed by Dr. Dayna Barker and night with the patient.  He and his wife did not have any objections and understand the risks involved while on heparin/anticoagulation.    Prostate cancer metastatic to bone [facility]) Continue follow-ups/therapy per oncology and urology. Continue long-acting and slow release morphine for bone metastases pain.    Gastroesophageal reflux disease Protonix 40 mg p.o. daily.    Sleep apnea CPAP at bedtime.    Hypertension Continue atenolol 100 mg p.o. daily. Continue Cardizem 300 mg p.o. Daily. Hold  Maxide.    Thyroid disease Continue levothyroxine 25 mcg p.o. daily. Check TSH.    Diabetes mellitus without complication ([facility]) Carb modified diet. Hold metformin. CBG monitoring with regular insulin sliding scale.    DVT prophylaxis: On heparin infusion Code Status: DO NOT RESUSCITATE. Family Communication: His wife was in the ED room. Disposition Plan: Admit to [facility] for further work-up and management. Consults called:  Admission status: Inpatient/stepdown.   Reubin Milan MD [facility] Pager [phone].  If 7PM-7AM, please contact night-coverage [email or web address] Password [email or web address]  10/06/2017, 10:05 PM

## 2017-10-07 ENCOUNTER — Inpatient Hospital Stay (HOSPITAL_COMMUNITY): Payer: Medicare Other

## 2017-10-07 DIAGNOSIS — S065X9A Traumatic subdural hemorrhage with loss of consciousness of unspecified duration, initial encounter: Secondary | ICD-10-CM | POA: Diagnosis present

## 2017-10-07 DIAGNOSIS — I10 Essential (primary) hypertension: Secondary | ICD-10-CM

## 2017-10-07 DIAGNOSIS — E119 Type 2 diabetes mellitus without complications: Secondary | ICD-10-CM

## 2017-10-07 DIAGNOSIS — S065XAA Traumatic subdural hemorrhage with loss of consciousness status unknown, initial encounter: Secondary | ICD-10-CM | POA: Diagnosis present

## 2017-10-07 LAB — HEPARIN LEVEL (UNFRACTIONATED)
Heparin Unfractionated: 0.44 IU/mL (ref 0.30–0.70)
Heparin Unfractionated: 0.52 IU/mL (ref 0.30–0.70)

## 2017-10-07 LAB — GLUCOSE, CAPILLARY
GLUCOSE-CAPILLARY: 154 mg/dL — AB (ref 70–99)
GLUCOSE-CAPILLARY: 120 mg/dL — AB (ref 70–99)
Glucose-Capillary: 120 mg/dL — ABNORMAL HIGH (ref 70–99)
Glucose-Capillary: 127 mg/dL — ABNORMAL HIGH (ref 70–99)

## 2017-10-07 LAB — ECHOCARDIOGRAM COMPLETE
HEIGHTINCHES: 71 in
WEIGHTICAEL: 3467.39 [oz_av]

## 2017-10-07 MED ORDER — INSULIN ASPART 100 UNIT/ML ~~LOC~~ SOLN
0.0000 [IU] | Freq: Three times a day (TID) | SUBCUTANEOUS | Status: DC
  Administered 2017-10-07 – 2017-10-08 (×2): 2 [IU] via SUBCUTANEOUS
  Administered 2017-10-08: 1 [IU] via SUBCUTANEOUS
  Administered 2017-10-08: 2 [IU] via SUBCUTANEOUS
  Administered 2017-10-09 (×2): 1 [IU] via SUBCUTANEOUS

## 2017-10-07 MED ORDER — MORPHINE SULFATE ER 15 MG PO TBCR
60.0000 mg | EXTENDED_RELEASE_TABLET | Freq: Two times a day (BID) | ORAL | Status: DC
  Administered 2017-10-07 – 2017-10-09 (×6): 60 mg via ORAL
  Filled 2017-10-07 (×6): qty 4

## 2017-10-07 MED ORDER — PREDNISONE 5 MG PO TABS
5.0000 mg | ORAL_TABLET | Freq: Every day | ORAL | Status: DC
  Administered 2017-10-07 – 2017-10-09 (×3): 5 mg via ORAL
  Filled 2017-10-07 (×3): qty 1

## 2017-10-07 MED ORDER — MORPHINE SULFATE 15 MG PO TABS: 30.0000 mg | ORAL_TABLET | Freq: Four times a day (QID) | ORAL | Status: DC | PRN

## 2017-10-07 MED ORDER — ATORVASTATIN CALCIUM 10 MG PO TABS
10.0000 mg | ORAL_TABLET | Freq: Every morning | ORAL | Status: DC
  Administered 2017-10-07 – 2017-10-09 (×3): 10 mg via ORAL
  Filled 2017-10-07 (×3): qty 1

## 2017-10-07 MED ORDER — POTASSIUM CHLORIDE CRYS ER 20 MEQ PO TBCR
20.0000 meq | EXTENDED_RELEASE_TABLET | Freq: Every day | ORAL | Status: DC
  Administered 2017-10-07 – 2017-10-09 (×3): 20 meq via ORAL
  Filled 2017-10-07 (×3): qty 1

## 2017-10-07 MED ORDER — ADULT MULTIVITAMIN W/MINERALS CH
1.0000 | ORAL_TABLET | Freq: Every day | ORAL | Status: DC
  Administered 2017-10-07 – 2017-10-09 (×3): 1 via ORAL
  Filled 2017-10-07 (×3): qty 1

## 2017-10-07 MED ORDER — GABAPENTIN 300 MG PO CAPS
300.0000 mg | ORAL_CAPSULE | Freq: Every morning | ORAL | Status: DC
  Administered 2017-10-07 – 2017-10-09 (×3): 300 mg via ORAL
  Filled 2017-10-07 (×3): qty 1

## 2017-10-07 MED ORDER — ATENOLOL 25 MG PO TABS
100.0000 mg | ORAL_TABLET | Freq: Every day | ORAL | Status: DC
  Administered 2017-10-07 – 2017-10-09 (×3): 100 mg via ORAL
  Filled 2017-10-07 (×3): qty 4

## 2017-10-07 MED ORDER — TAMSULOSIN HCL 0.4 MG PO CAPS
0.8000 mg | ORAL_CAPSULE | Freq: Every morning | ORAL | Status: DC
  Administered 2017-10-07 – 2017-10-09 (×3): 0.8 mg via ORAL
  Filled 2017-10-07 (×3): qty 2

## 2017-10-07 MED ORDER — ENSURE ENLIVE PO LIQD
237.0000 mL | Freq: Two times a day (BID) | ORAL | Status: DC
  Administered 2017-10-07 – 2017-10-08 (×2): 237 mL via ORAL

## 2017-10-07 MED ORDER — MAGIC MOUTHWASH W/LIDOCAINE
5.0000 mL | Freq: Four times a day (QID) | ORAL | Status: DC | PRN
  Filled 2017-10-07: qty 5

## 2017-10-07 MED ORDER — LEVOTHYROXINE SODIUM 25 MCG PO TABS
25.0000 ug | ORAL_TABLET | Freq: Every day | ORAL | Status: DC
  Administered 2017-10-07 – 2017-10-09 (×3): 25 ug via ORAL
  Filled 2017-10-07 (×3): qty 1

## 2017-10-07 MED ORDER — DILTIAZEM HCL ER COATED BEADS 180 MG PO CP24
300.0000 mg | ORAL_CAPSULE | Freq: Every day | ORAL | Status: DC
  Administered 2017-10-07 – 2017-10-09 (×3): 300 mg via ORAL
  Filled 2017-10-07 (×3): qty 1

## 2017-10-07 MED ORDER — GABAPENTIN 300 MG PO CAPS
600.0000 mg | ORAL_CAPSULE | Freq: Every day | ORAL | Status: DC
  Administered 2017-10-07 – 2017-10-08 (×3): 600 mg via ORAL
  Filled 2017-10-07 (×3): qty 2

## 2017-10-07 MED ORDER — LYSINE 500 MG PO CAPS: 500.0000 mg | ORAL_CAPSULE | Freq: Every day | ORAL | Status: DC

## 2017-10-07 NOTE — Progress Notes (Signed)
ANTICOAGULATION CONSULT NOTE - Follow Up Consult  Pharmacy Consult for Heparin Indication: pulmonary embolus  No Known Allergies  Patient Measurements: Height: 5\' 11"  (180.3 cm) Weight: 216 lb 11.4 oz (98.3 kg) IBW/kg (Calculated) : 75.3 HEPARIN DW (KG): 95.4  Vital Signs: Temp: 98.3 F (36.8 C) (08/07 0809) Temp Source: Oral (08/07 0809) BP: 123/66 (08/07 0944) Pulse Rate: 65 (08/07 0809)  Labs: Recent Labs    10/06/17 1705 10/06/17 2308 10/07/17 0847  HGB 8.6*  --   --   HCT 27.3*  --   --   PLT 133*  --   --   LABPROT 13.1  --   --   INR 1.00  --   --   HEPARINUNFRC  --   --  0.44  CREATININE 0.83  --   --   TROPONINI <0.03 <0.03  --     Estimated Creatinine Clearance: 96.2 mL/min (by C-G formula based on SCr of 0.83 mg/dL).    Assessment: 72 yo male presented with worsening shortness of breath.  Found to have a pulmonary embolus.  Also with a small SDH following falls at home.  Heparin level this AM = 0.44 (therapeutic)  Goal of Therapy:  Heparin level 0.3-0.7 units/ml Monitor platelets by anticoagulation protocol: Yes   Plan:  Continue heparin at 1500 units / hr Recheck heparin level at 1800 pm today Daily heparin level, CBC  Thank you Anette Guarneri, PharmD [phone] 10/07/2017,9:59 AM

## 2017-10-07 NOTE — Progress Notes (Signed)
ANTICOAGULATION CONSULT NOTE - Follow Up Consult  Pharmacy Consult for Heparin Indication: pulmonary embolus  No Known Allergies  Patient Measurements: Height: 5\' 11"  (180.3 cm) Weight: 216 lb 11.4 oz (98.3 kg) IBW/kg (Calculated) : 75.3 Heparin Dosing Weight: 95 kg  Vital Signs: Temp: 99.9 F (37.7 C) (08/07 1952) Temp Source: Oral (08/07 1952) BP: 126/56 (08/07 1952) Pulse Rate: 65 (08/07 1640)  Labs: Recent Labs    10/06/17 1705 10/06/17 2308 10/07/17 0847 10/07/17 1823  HGB 8.6*  --   --   --   HCT 27.3*  --   --   --   PLT 133*  --   --   --   LABPROT 13.1  --   --   --   INR 1.00  --   --   --   HEPARINUNFRC  --   --  0.44 0.52  CREATININE 0.83  --   --   --   TROPONINI <0.03 <0.03  --   --     Estimated Creatinine Clearance: 96.2 mL/min (by C-G formula based on SCr of 0.83 mg/dL).  Assessment:  72 yo male presented with worsening shortness of breath.  Found to have a pulmonary embolus.  Also with a small SDH following falls at home.  MDs discussed risks/benefits of anticoagulation with patient and wife.    Heparin level remains mid-range therapeutic (0.52) on 1500 units/hr.  Goal of Therapy:  Heparin level 0.3-0.7 units/ml Monitor platelets by anticoagulation protocol: Yes   Plan:   Continue heparin drip at 1500 units/hr  Next heparin level and CBC in am.  Monitor for s/sx bleeding.  Arty Baumgartner, [place] Pager: [phone] 10/07/2017,8:02 PM

## 2017-10-07 NOTE — Progress Notes (Signed)
Echocardiogram 2D Echocardiogram has been performed.  Micheal Davis 10/07/2017, 1:58 PM

## 2017-10-07 NOTE — Progress Notes (Signed)
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  Patient was asymptomatic.  CT scan of the head is said to revealed a small dural hematoma.  Patient is currently on treatment for pulmonary embolism.  Assessment & Plan:   Principal Problem:   Acute pulmonary embolism (HCC) Active Problems:   Prostate cancer metastatic to bone (HCC)   Gastroesophageal reflux disease   Sleep apnea   Hypertension   Thyroid disease   Diabetes mellitus without complication (HCC)   Subdural hematoma (HCC)   Acute pulmonary embolism: Continue IV heparin. Consult hematology/oncology team. We will defer the choice of anticoagulation to the patient's oncologist.  Prostate cancer with metastasis to the bone: Management as per the oncology team.  Hypertension: Continue to optimize.  Diabetes mellitus: Continue to optimize.  DVT prophylaxis: Heparin drip Code Status: DNR Family Communication: Sister, niece and nephew Disposition Plan: Likely back to home   Consultants:   We will consult hematology/oncology team  Procedures:   None  Antimicrobials:   None   Subjective: No chest pain. No shortness of breath. No fever or chills.  Objective: Vitals:   10/07/17 0944 10/07/17 1302 10/07/17 1640 10/07/17 1952  BP: 123/66 122/70 122/70 (!) 126/56  Pulse:  69 65   Resp:  18 18   Temp:  98.6 F (37 C) 99.3 F (37.4 C) 99.9 F (37.7 C)  TempSrc:  Oral Oral Oral  SpO2:  96% 96%   Weight:      Height:        Intake/Output Summary  (Last 24 hours) at 10/07/2017 2032 Last data filed at 10/07/2017 1700 Gross per 24 hour  Intake 1096.25 ml  Output 1000 ml  Net 96.25 ml   Filed Weights   10/06/17 1930 10/07/17 0015  Weight: 102.1 kg (225 lb) 98.3 kg (216 lb 11.4 oz)    Examination:  General exam: Appears calm and comfortable  Respiratory system: Clear to auscultation.  Cardiovascular system: S1 & S2 heard. Gastrointestinal system: Abdomen is nondistended, soft and nontender. No organomegaly or masses felt. Normal bowel sounds heard. Central nervous system: Alert and oriented. No focal neurological deficits. Extremities: No leg edema   Data Reviewed: I have personally reviewed following labs and imaging studies  CBC: Recent Labs  Lab 10/06/17 1705  WBC 6.7  NEUTROABS 5.5  HGB 8.6*  HCT 27.3*  MCV 96.8  PLT 956*   Basic Metabolic Panel: Recent Labs  Lab 10/06/17 1705  NA 136  K 4.1  CL 102  CO2 25  GLUCOSE 172*  BUN 15  CREATININE 0.83  CALCIUM 8.7*   GFR: Estimated Creatinine Clearance: 96.2 mL/min (by C-G formula based on SCr of 0.83 mg/dL). Liver Function Tests: Recent Labs  Lab 10/06/17 1705  AST 38  ALT 15  ALKPHOS 136*  BILITOT 0.7  PROT 6.1*  ALBUMIN 3.4*   No results for input(s): LIPASE, AMYLASE in the last 168 hours. No results for input(s): AMMONIA in the last 168 hours. Coagulation Profile: Recent Labs  Lab 10/06/17 1705  INR 1.00  Cardiac Enzymes: Recent Labs  Lab 10/06/17 1705 10/06/17 2308  TROPONINI <0.03 <0.03   BNP (last 3 results) No results for input(s): PROBNP in the last 8760 hours. HbA1C: No results for input(s): HGBA1C in the last 72 hours. CBG: Recent Labs  Lab 10/07/17 0601 10/07/17 1142 10/07/17 1637  GLUCAP 120* 154* 127*   Lipid Profile: No results for input(s): CHOL, HDL, LDLCALC, TRIG, CHOLHDL, LDLDIRECT in the last 72 hours. Thyroid Function Tests: No results for input(s): TSH, T4TOTAL, FREET4, T3FREE, THYROIDAB in the last 72  hours. Anemia Panel: No results for input(s): VITAMINB12, FOLATE, FERRITIN, TIBC, IRON, RETICCTPCT in the last 72 hours. Urine analysis:    Component Value Date/Time   COLORURINE YELLOW 09/27/2010 1055   APPEARANCEUR CLOUDY (A) 09/27/2010 1055   LABSPEC 1.016 09/27/2010 1055   PHURINE 7.5 09/27/2010 1055   GLUCOSEU NEGATIVE 09/27/2010 1055   HGBUR NEGATIVE 09/27/2010 1055   BILIRUBINUR NEGATIVE 09/27/2010 1055   KETONESUR NEGATIVE 09/27/2010 1055   PROTEINUR NEGATIVE 09/27/2010 1055   UROBILINOGEN 1.0 09/27/2010 1055   NITRITE NEGATIVE 09/27/2010 1055   LEUKOCYTESUR NEGATIVE 09/27/2010 1055   Sepsis Labs: @LABRCNTIP (procalcitonin:4,lacticidven:4)  )No results found for this or any previous visit (from the past 240 hour(s)).       Radiology Studies: Ct Head Wo Contrast  Result Date: 10/06/2017 CLINICAL DATA:  History prostate cancer on chemotherapy currently. Pulmonary embolism today. EXAM: CT HEAD WITHOUT CONTRAST TECHNIQUE: Contiguous axial images were obtained from the base of the skull through the vertex without intravenous contrast. COMPARISON:  10/20/2012 FINDINGS: Brain: Ventricles and cisterns are within normal. There is a small right convexity subdural hematoma likely subacute in nature measuring 7 mm in thickness over the frontal region just above the level of the lateral ventricles. Very subtle local mass effect and no significant midline shift. No evidence of mass. No acute infarction. Vascular: No hyperdense vessel or unexpected calcification. Skull: Normal. Negative for fracture or focal lesion. Sinuses/Orbits: Orbits are within normal. There is opacification of the floor the right maxillary sinus likely chronic inflammatory change. Other: None. IMPRESSION: Small right convexity subdural hematoma likely subacute in nature with minimal local mass effect, but no significant midline shift. This measures 7 mm in thickness. Critical Value/emergent results were called by  telephone at the time of interpretation on 10/06/2017 at 6:12 pm to Dr. Merrily Pew , who verbally acknowledged these results. Electronically Signed   By: Marin Olp M.D.   On: 10/06/2017 18:12   Ct Abdomen Pelvis W Contrast  Result Date: 10/06/2017 CLINICAL DATA:  Restaging metastatic prostate cancer with ongoing chemotherapy. Metastatic disease to the liver and skeleton. Low back pain and bilateral leg/hip pain. EXAM: CT ABDOMEN AND PELVIS WITH CONTRAST TECHNIQUE: Multidetector CT imaging of the abdomen and pelvis was performed using the standard protocol following bolus administration of intravenous contrast. CONTRAST:  117mL ISOVUE-300 IOPAMIDOL (ISOVUE-300) INJECTION 61% COMPARISON:  Multiple exams, including 08/25/2017 FINDINGS: Lower chest: Mild cardiomegaly. Scarring anteriorly in the right lower lobe. Atherosclerotic calcification of the descending thoracic aorta. There is abnormal hypodensity and expansion of right lower lobe pulmonary arterial branches compatible with acute pulmonary embolus. Right ventricular to left ventricular ratio 1.0. Small amount of contrast in the distal esophagus. Hepatobiliary: Increased central necrosis and an index 5.1 by 5.5 cm lesion primarily in segment 4 of the liver on image 19/2, previously 5.2 by 5.5 cm. The other liver lesions appear stable from 08/25/2017. No definite new lesions, although some of the lesions are isodense to  the liver and thus difficult to discern. Pancreas: Unremarkable Spleen: Unremarkable Adrenals/Urinary Tract: Stable right kidney upper pole fluid density lesion favoring cyst. Small left mid kidney lesion posterolaterally is stable and technically nonspecific due to small size. Adrenal glands unremarkable. Stomach/Bowel: Unremarkable Vascular/Lymphatic: Aortoiliac atherosclerotic vascular disease. No pathologic adenopathy in the abdomen/pelvis. Reproductive: The prostate gland is not enlarged and has a stable contour without asymmetry.  Seminal vesicles do not appear asymmetric. Other: No supplemental non-categorized findings. Musculoskeletal: Diffuse sclerosis of the skeleton compatible with prior widespread osseous metastatic disease. 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 Bilateral foraminal impingement at L5-S1. Lumbar spondylosis and degenerative disc disease noted with additional impingement suspected at L3-4 and possibly L2-3. Old healed left posterior rib fractures. IMPRESSION: 1. Acute pulmonary embolus in the visualized portion of the right lower lobe. Positive for acute PE with CT evidence of right heart strain (RV/LV Ratio = 1.0) consistent with at least submassive (intermediate risk) PE. The presence of right heart strain has been associated with an increased risk of morbidity and mortality. The patient should be transported to the emergency room, preferably by EMS. Treating personnel: Please activate Code PE by paging [phone]. 2. Similar appearance of scattered hepatic metastatic lesions. Although unchanged in size, a dominant lesion primarily in segment 4 of the liver does demonstrate some increase in central necrosis compared to previous. 3. Diffuse skeletal sclerosis compatible with prior widespread osseous metastatic disease. 4. Other imaging findings of potential clinical significance: Aortic Atherosclerosis (ICD10-I70.0). Mild cardiomegaly. 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 Foraminal impingement at L5-S1 and L3-4, and possibly L2-3. Critical Value/emergent results were called by telephone at the time of interpretation on 10/06/2017 at 3:45 pm to Dr. Tera Helper, who verbally acknowledged these results. Electronically Signed   By: Van Clines M.D.   On: 10/06/2017 15:46        Scheduled Meds: . atenolol  100 mg Oral Daily  . atorvastatin  10 mg Oral q morning - 10a  . diltiazem  300 mg Oral Daily  . feeding supplement (ENSURE ENLIVE)  237 mL Oral BID BM  .  gabapentin  300 mg Oral q morning - 10a  . gabapentin  600 mg Oral QHS  . insulin aspart  0-9 Units Subcutaneous TID WC  . levothyroxine  25 mcg Oral QAC breakfast  . morphine  60 mg Oral Q12H  . multivitamin with minerals  1 tablet Oral Daily  . potassium chloride SA  20 mEq Oral Daily  . predniSONE  5 mg Oral Daily  . tamsulosin  0.8 mg Oral q morning - 10a   Continuous Infusions: . heparin 1,500 Units/hr (10/07/17 0954)     LOS: 1 day    Time spent: 28 Minutes.    Dana Allan, MD  [facility] Pager #: [phone] 7PM-7AM contact night coverage as above

## 2017-10-07 NOTE — Progress Notes (Addendum)
Nutrition Brief Note  Patient identified on the Malnutrition Screening Tool (MST) Report.  Wt Readings from Last 15 Encounters:  10/07/17 216 lb 11.4 oz (98.3 kg)  09/17/17 225 lb 3.2 oz (102.2 kg)  08/27/17 231 lb 11.2 oz (105.1 kg)  08/06/17 227 lb (103 kg)  07/16/17 222 lb 3.2 oz (100.8 kg)  06/25/17 223 lb 12.8 oz (101.5 kg)  06/18/17 222 lb (100.7 kg)  06/08/17 225 lb 8 oz (102.3 kg)  05/29/17 226 lb 8 oz (102.7 kg)  05/22/17 223 lb (101.2 kg)  05/14/17 227 lb 6.4 oz (103.1 kg)  04/29/17 232 lb 12.8 oz (105.6 kg)  04/23/17 241 lb 8 oz (109.5 kg)  04/08/17 237 lb (107.5 kg)  03/26/17 232 lb 4.8 oz (105.4 kg)   Body mass index is 30.23 kg/m. Patient meets criteria for Obesity Class I based on current BMI.   Current diet order is Heart Healthy/Carbohydrate Modified; pt consuming approximately 75% of meals at this time.   Reports he is drinking Ensure Enlive supplements. Labs and medications reviewed. CBG's 120-154.  No further nutrition interventions warranted at this time. If nutrition issues arise, please consult RD.   Arthur Holms, RD, LDN Pager #: [phone] After-Hours Pager #: [phone]

## 2017-10-07 NOTE — Progress Notes (Signed)
10/07/2017 4:39 PM Pt walked 279ft with rolling walker earlier today on RA without difficulty.  Encouraged him to walk more throughout the day.   Carney [place]

## 2017-10-08 DIAGNOSIS — I6201 Nontraumatic acute subdural hemorrhage: Secondary | ICD-10-CM

## 2017-10-08 DIAGNOSIS — C61 Malignant neoplasm of prostate: Secondary | ICD-10-CM

## 2017-10-08 DIAGNOSIS — I2609 Other pulmonary embolism with acute cor pulmonale: Secondary | ICD-10-CM

## 2017-10-08 DIAGNOSIS — I2699 Other pulmonary embolism without acute cor pulmonale: Principal | ICD-10-CM

## 2017-10-08 DIAGNOSIS — C787 Secondary malignant neoplasm of liver and intrahepatic bile duct: Secondary | ICD-10-CM

## 2017-10-08 DIAGNOSIS — C7951 Secondary malignant neoplasm of bone: Secondary | ICD-10-CM

## 2017-10-08 LAB — GLUCOSE, CAPILLARY
Glucose-Capillary: 145 mg/dL — ABNORMAL HIGH (ref 70–99)
Glucose-Capillary: 153 mg/dL — ABNORMAL HIGH (ref 70–99)
Glucose-Capillary: 155 mg/dL — ABNORMAL HIGH (ref 70–99)
Glucose-Capillary: 130 mg/dL — ABNORMAL HIGH (ref 70–99)

## 2017-10-08 LAB — CBC
HCT: 25.2 % — ABNORMAL LOW (ref 39.0–52.0)
Hemoglobin: 8.1 g/dL — ABNORMAL LOW (ref 13.0–17.0)
MCH: 30.9 pg (ref 26.0–34.0)
MCHC: 32.1 g/dL (ref 30.0–36.0)
MCV: 96.2 fL (ref 78.0–100.0)
Platelets: 144 10*3/uL — ABNORMAL LOW (ref 150–400)
RBC: 2.62 MIL/uL — ABNORMAL LOW (ref 4.22–5.81)
RDW: 19.6 % — ABNORMAL HIGH (ref 11.5–15.5)
WBC: 8.1 10*3/uL (ref 4.0–10.5)

## 2017-10-08 LAB — HEPARIN LEVEL (UNFRACTIONATED): Heparin Unfractionated: 0.46 IU/mL (ref 0.30–0.70)

## 2017-10-08 NOTE — Progress Notes (Signed)
ANTICOAGULATION CONSULT NOTE - Follow Up Consult  Pharmacy Consult for Heparin Indication: pulmonary embolus  No Known Allergies  Patient Measurements: Height: 5\' 11"  (180.3 cm) Weight: 216 lb 11.4 oz (98.3 kg) IBW/kg (Calculated) : 75.3 Heparin Dosing Weight: 95 kg  Vital Signs: Temp: 99.1 F (37.3 C) (08/08 0810) Temp Source: Oral (08/08 0810) BP: 123/67 (08/08 0810) Pulse Rate: 71 (08/08 0810)  Labs: Recent Labs    10/06/17 1705 10/06/17 2308 10/07/17 0847 10/07/17 1823 10/08/17 0312  HGB 8.6*  --   --   --  8.1*  HCT 27.3*  --   --   --  25.2*  PLT 133*  --   --   --  144*  LABPROT 13.1  --   --   --   --   INR 1.00  --   --   --   --   HEPARINUNFRC  --   --  0.44 0.52 0.46  CREATININE 0.83  --   --   --   --   TROPONINI <0.03 <0.03  --   --   --     Estimated Creatinine Clearance: 96.2 mL/min (by C-G formula based on SCr of 0.83 mg/dL).  Assessment:  72 yo male presented with worsening shortness of breath.  Found to have a pulmonary embolus.  Also with a small SDH following falls at home.  MDs discussed risks/benefits of anticoagulation with patient and wife.  Heparin level therapeutic CBC stable   Goal of Therapy:  Heparin level 0.3-0.7 units/ml Monitor platelets by anticoagulation protocol: Yes   Plan:   Continue heparin drip at 1500 units/hr  Next heparin level and CBC in am.  Monitor for s/sx bleeding.  Thank you Anette Guarneri, PharmD [phone] 10/08/2017,8:47 AM

## 2017-10-08 NOTE — Progress Notes (Signed)
HEMATOLOGY-ONCOLOGY PROGRESS NOTE  SUBJECTIVE: Patient with metastatic prostate cancer admitted with PE with right heart strain as well as a subacute subdural hematoma.  Currently on heparin drip and tolerating it very well.    Prostate cancer metastatic to bone ([place])   10/01/2012 Initial Diagnosis    Prostate biopsied with highest Gleason score of 9 seen and the lowest score was 7.    10/04/2012 - 05/16/2013 Chemotherapy    Depo-Lupron and Casodex initiated    05/16/2013 -  Chemotherapy    Depo-Lupron monthly continued    05/16/2013 Progression    Progression by PSA elevation    05/16/2013 - 10/22/2014 Chemotherapy    Abiraterone and prednisone initiated in conjunction with ongoing Depo-Lupron.  Denosumab also ongoing.    10/23/2014 Progression    PSA increasing from 0.2- 1.6 in less than 6 months.      10/23/2014 - 01/30/2015 Chemotherapy    Enzalutamide and Prednisone (5 mg in AM and 2.5 mg in PM)    01/30/2015 Imaging    Bone scan- New focus of intense activity in right proximal humerus.  Interim increase in activity over left hip.    01/30/2015 Progression    Bone scan reveals new disease in right humerus consistent with progression of disease    01/31/2015 Imaging    Right humerus xray- blastic foci in proximal right humeral metaphysis and over right mid-humeral diaphysis.  No evidence of fracture    07/06/2015 Progression    Progression in multiple bones especially L hip and femurs    07/06/2015 Imaging    Bone scan- progressive multifocal osseous metastases in the right proximal femora and distal femoral shafts.  Stable update in bilateral ribs suspicious for small rib metastases    07/16/2015 - 07/31/2015 Radiation Therapy    Left femur 30 Gy in 10 fractions by Dr. Tammi Klippel    11/23/2015 - 01/04/2016 Chemotherapy    The patient had pegfilgrastim (NEULASTA ONPRO KIT) injection 6 mg, 6 mg, Subcutaneous, Once, 3 of 7 cycles  DOCEtaxel (TAXOTERE) 180 mg in dextrose 5 % 250 mL chemo  infusion, 75 mg/m2 = 180 mg, Intravenous,  Once, 3 of 7 cycles Dose modification: 64 mg/m2 (original dose 75 mg/m2, Cycle 2, Reason: Dose not tolerated)  pegfilgrastim (NEULASTA ONPRO KIT) injection 6 mg, 6 mg, Subcutaneous, Once, 0 of 4 cycles  cabazitaxel (JEVTANA) 60 mg in dextrose 5 % 250 mL chemo infusion, 25 mg/m2, Intravenous,  Once, 0 of 4 cycles  for chemotherapy treatment.      11/30/2015 Adverse Reaction    Diarrhea (secondary to chemotherapy) and dehydration requiring IV fluids    12/14/2015 Treatment Plan Change    Docetaxel dose reduced by 15%    12/31/2015 Procedure    Port placed by Dr. Arnoldo Morale    01/28/2016 -  Chemotherapy    Cabazitaxel (Jevtana)     03/27/2016 Imaging    CT Chest, Abdomen, and Pelvis with contrast 1. Diffuse sclerotic osseous metastatic disease in the chest, abdomen, and pelvis without acute fracture identified. 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 These appear chronic. 2. The prostate gland is normal in size and no adenopathy is currently identified. 3. On a prior MRI of 10/04/2012, there was a posterior right hepatic lobe lesion. This lesion is not currently visible on today' s CT. This could be due to differences in cons acuity between CT or MRI, or resolution of the lesion. 4. Coronary, aortic arch, and branch vessel atherosclerotic vascular  disease. Aortoiliac atherosclerotic vascular disease. 5. Single bilateral renal cysts.    04/16/2016 Imaging    Bone scan- Findings consist with progressive metastatic disease. Activity over the proximal right humerus and proximal bilateral femurs are worrisome for the possible development of pathologic fractures.    08/21/2016 Imaging    CT C/A/P: IMPRESSION: No significant change since 03/27/2016 CT. Diffuse bony metastases without other evidence of metastatic disease.    08/21/2016 Imaging    Bone Scan: IMPRESSION: Multiple areas of increased activity  again noted throughout the axial and appendicular skeleton in similar locations as prior exam. Intensity of uptake is increased from prior exam suggesting progressive disease. Lesions present in the proximal humeri, proximal femurs, and the mid right femur susceptible to pathologic fracture.    12/02/2016 Imaging    CT C/A/P: IMPRESSION: 1. Overall stable appearance of diffuse osseous metastatic disease and resulting patchy sclerosis. 2. The patient had a posterior right hepatic lobe lesion on prior MRI from 2014 which is been relatively occult on CT. Given the lack of progression I suspect that this is benign or has been effectively treated. 3. Aortic Atherosclerosis (ICD10-I70.0). Coronary atherosclerosis with mild cardiomegaly. 4. Cholelithiasis. 5. Bilateral benign renal cysts. 6. 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    12/02/2016 Imaging    Bone Scan: IMPRESSION: 1. Widespread osseous metastatic disease. Multiplicity is similar to previous exam with interval increase in degree of tracer uptake associated with multiple lesions.    03/11/2017 Imaging    Bone Scan: Multifocal skeletal disease without sign of progression CT C/A/P: Diffuse sclerotic skeletal lesions, unchanged from the previous.  No new lymphadenopathy, pulmonary nodules, or hepatic lesions to suggest soft tissue progressive disease    06/18/2017 -  Chemotherapy    The patient had pegfilgrastim (NEULASTA ONPRO KIT) injection 6 mg, 6 mg, Subcutaneous, Once, 5 of 8 cycles Administration: 6 mg (06/25/2017), 6 mg (07/16/2017), 6 mg (08/06/2017), 6 mg (08/27/2017), 6 mg (09/17/2017) DOCEtaxel (TAXOTERE) 130 mg in sodium chloride 0.9 % 250 mL chemo infusion, 60 mg/m2 = 130 mg (80 % of original dose 75 mg/m2), Intravenous,  Once, 5 of 8 cycles Dose  modification: 60 mg/m2 (80 % of original dose 75 mg/m2, Cycle 1, Reason: Provider Judgment) Administration: 130 mg (06/25/2017), 130 mg (07/16/2017), 130 mg (08/06/2017), 130 mg (08/27/2017), 130 mg (09/17/2017) ondansetron (ZOFRAN) 8 mg in sodium chloride 0.9 % 50 mL IVPB, , Intravenous,  Once, 5 of 8 cycles  for chemotherapy treatment.      OBJECTIVE: REVIEW OF SYSTEMS:   Constitutional: Denies fevers, chills or abnormal weight loss Eyes: Denies blurriness of vision Ears, nose, mouth, throat, and face: Denies mucositis or sore throat Respiratory: No shortness of breath at rest Cardiovascular: Denies palpitation, chest discomfort Gastrointestinal:  Denies nausea, heartburn or change in bowel habits Skin: Denies abnormal skin rashes Lymphatics: Denies new lymphadenopathy or easy bruising Neurological: Neuropathy from chemotherapy Behavioral/Psych: Mood is stable, no new changes  Extremities: Positive lower extremity edema All other systems were reviewed with the patient and are negative.  I have reviewed the past medical history, past surgical history, social history and family history with the patient and they are unchanged from previous note.   PHYSICAL EXAMINATION: ECOG PERFORMANCE STATUS: 2 - Symptomatic, <50% confined to bed  Vitals:   10/08/17 1432 10/08/17 1654  BP:  119/62  Pulse:  67  Resp:  18  Temp: 98.9 F (37.2 C) 99.7 F (37.6 C)  SpO2:  94%   Filed Weights   10/06/17 1930 10/07/17 0015  Weight: 225 lb (102.1 kg) 216 lb 11.4 oz (98.3 kg)    GENERAL:alert, no distress and comfortable SKIN: skin color, texture, turgor are normal, no rashes or significant lesions EYES: normal, Conjunctiva are pink and non-injected, sclera clear OROPHARYNX:no exudate, no erythema and lips, buccal mucosa, and tongue normal  NECK: supple, thyroid normal size, non-tender, without nodularity LYMPH:  no palpable lymphadenopathy in the cervical, axillary or inguinal LUNGS: Diminished  breath sounds at bases HEART: regular rate & rhythm and no murmurs and 2+ lower extremity edema ABDOMEN:abdomen soft, non-tender and normal bowel sounds Musculoskeletal:no cyanosis of digits and no clubbing  NEURO: alert & oriented x 3 with fluent speech, no focal motor/sensory deficits  LABORATORY DATA:  I have reviewed the data as listed CMP Latest Ref Rng & Units 10/06/2017 09/17/2017 08/25/2017  Glucose 70 - 99 mg/dL 172(H) 111(H) 118(H)  BUN 8 - 23 mg/dL '15 15 11  ' Creatinine 0.61 - 1.24 mg/dL 0.83 0.76 0.62  Sodium 135 - 145 mmol/L 136 138 140  Potassium 3.5 - 5.1 mmol/L 4.1 3.8 3.9  Chloride 98 - 111 mmol/L 102 103 105  CO2 22 - 32 mmol/L '25 26 26  ' Calcium 8.9 - 10.3 mg/dL 8.7(L) 8.6(L) 8.2(L)  Total Protein 6.5 - 8.1 g/dL 6.1(L) 6.3(L) 6.2(L)  Total Bilirubin 0.3 - 1.2 mg/dL 0.7 0.7 0.8  Alkaline Phos 38 - 126 U/L 136(H) 148(H) 160(H)  AST 15 - 41 U/L 38 24 23  ALT 0 - 44 U/L '15 15 15    ' Lab Results  Component Value Date   WBC 8.1 10/08/2017   HGB 8.1 (L) 10/08/2017   HCT 25.2 (L) 10/08/2017   MCV 96.2 10/08/2017   PLT 144 (L) 10/08/2017   NEUTROABS 5.5 10/06/2017    ASSESSMENT AND PLAN: 1.  Metastatic prostate cancer to liver and bones: On prior multiple lines of therapy and currently on chemotherapy. CT abdomen pelvis 10/06/2017: Revealed acute PE in the right lower lobe with right heart strain.  Similar appearance of liver mets as well as bone mets. I discussed the patient that his treatment for prostate cancer will be guided by Dr. [name]  2. acute pulmonary embolism with subacute subdural hematoma: Patient appears to have tolerated heparin fairly well.  He has no neurological localizing symptoms.  I recommended that we can switch him to Xarelto at 15 mg p.o. twice daily loading dose for the next 3 weeks followed by 20 mg daily.  He can be discharged home tomorrow. I will inform Dr. [name] regarding his hospitalization and request him to follow him up in his  office. Thank you for consulting [place].

## 2017-10-08 NOTE — Progress Notes (Signed)
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  Patient was asymptomatic.  CT scan of the head is said to revealed a small dural hematoma.  Patient is currently on treatment for pulmonary embolism.  10/08/2017: Consulted hematology/oncology team.  Patient will be discharged back home tomorrow on Xarelto as per the oncology team.  Patient remains stable.  Assessment & Plan:   Principal Problem:   Acute pulmonary embolism (HCC) Active Problems:   Prostate cancer metastatic to bone (HCC)   Gastroesophageal reflux disease   Sleep apnea   Hypertension   Thyroid disease   Diabetes mellitus without complication (HCC)   Subdural hematoma (HCC)   Acute pulmonary embolism: Continue IV heparin. Consulted hematology/oncology team. As per oncology team, patient be discharged back home tomorrow on Xarelto.    Prostate cancer with metastasis to the bone: Management as per the oncology team.  Hypertension: Continue to optimize.  Diabetes mellitus: Continue to optimize.  DVT prophylaxis: Heparin drip Code Status: DNR Family Communication: Sister, niece and nephew Disposition Plan: Likely back to home   Consultants:   We will consult hematology/oncology team  Procedures:   None  Antimicrobials:   None   Subjective: No chest pain. No shortness of breath. No fever or chills.  Objective: Vitals:   10/08/17 0810 10/08/17 1344 10/08/17 1432 10/08/17 1654  BP: 123/67 113/60  119/62  Pulse: 71 71  67  Resp: 18 18  18   Temp:  99.1 F (37.3 C) (!) 101.1 F (38.4 C) 98.9 F (37.2 C) 99.7 F (37.6 C)  TempSrc: Oral Oral  Oral  SpO2: 97% 95%  94%  Weight:      Height:        Intake/Output Summary (Last 24 hours) at 10/08/2017 1856 Last data filed at 10/08/2017 1440 Gross per 24 hour  Intake 240 ml  Output 1350 ml  Net -1110 ml   Filed Weights   10/06/17 1930 10/07/17 0015  Weight: 102.1 kg 98.3 kg    Examination:  General exam: Appears calm and comfortable  Respiratory system: Clear to auscultation.  Cardiovascular system: S1 & S2 heard. Gastrointestinal system: Abdomen is nondistended, soft and nontender. No organomegaly or masses felt. Normal bowel sounds heard. Central nervous system: Alert and oriented. No focal neurological deficits. Extremities: No leg edema   Data Reviewed: I have personally reviewed following labs and imaging studies  CBC: Recent Labs  Lab 10/06/17 1705 10/08/17 0312  WBC 6.7 8.1  NEUTROABS 5.5  --   HGB 8.6* 8.1*  HCT 27.3* 25.2*  MCV 96.8 96.2  PLT 133* 253*   Basic Metabolic Panel: Recent Labs  Lab 10/06/17 1705  NA 136  K 4.1  CL 102  CO2 25  GLUCOSE 172*  BUN 15  CREATININE 0.83  CALCIUM 8.7*   GFR: Estimated Creatinine Clearance: 96.2 mL/min (by C-G formula based on SCr of 0.83 mg/dL). Liver Function Tests: Recent Labs  Lab 10/06/17 1705  AST 38  ALT 15  ALKPHOS 136*  BILITOT 0.7  PROT 6.1*  ALBUMIN 3.4*  No results for input(s): LIPASE, AMYLASE in the last 168 hours. No results for input(s): AMMONIA in the last 168 hours. Coagulation Profile: Recent Labs  Lab 10/06/17 1705  INR 1.00   Cardiac Enzymes: Recent Labs  Lab 10/06/17 1705 10/06/17 2308  TROPONINI <0.03 <0.03   BNP (last 3 results) No results for input(s): PROBNP in the last 8760 hours. HbA1C: No results for input(s): HGBA1C in the last 72 hours. CBG: Recent Labs  Lab 10/07/17 1637 10/07/17 2145 10/08/17 0625 10/08/17 1158 10/08/17 1647  GLUCAP 127* 120*  145* 153* 155*   Lipid Profile: No results for input(s): CHOL, HDL, LDLCALC, TRIG, CHOLHDL, LDLDIRECT in the last 72 hours. Thyroid Function Tests: No results for input(s): TSH, T4TOTAL, FREET4, T3FREE, THYROIDAB in the last 72 hours. Anemia Panel: No results for input(s): VITAMINB12, FOLATE, FERRITIN, TIBC, IRON, RETICCTPCT in the last 72 hours. Urine analysis:    Component Value Date/Time   COLORURINE YELLOW 09/27/2010 1055   APPEARANCEUR CLOUDY (A) 09/27/2010 1055   LABSPEC 1.016 09/27/2010 1055   PHURINE 7.5 09/27/2010 1055   GLUCOSEU NEGATIVE 09/27/2010 1055   HGBUR NEGATIVE 09/27/2010 1055   BILIRUBINUR NEGATIVE 09/27/2010 1055   KETONESUR NEGATIVE 09/27/2010 1055   PROTEINUR NEGATIVE 09/27/2010 1055   UROBILINOGEN 1.0 09/27/2010 1055   NITRITE NEGATIVE 09/27/2010 1055   LEUKOCYTESUR NEGATIVE 09/27/2010 1055   Sepsis Labs: @LABRCNTIP (procalcitonin:4,lacticidven:4)  )No results found for this or any previous visit (from the past 240 hour(s)).       Radiology Studies: No results found.      Scheduled Meds: . atenolol  100 mg Oral Daily  . atorvastatin  10 mg Oral q morning - 10a  . diltiazem  300 mg Oral Daily  . feeding supplement (ENSURE ENLIVE)  237 mL Oral BID BM  . gabapentin  300 mg Oral q morning - 10a  . gabapentin  600 mg Oral QHS  . insulin aspart  0-9 Units Subcutaneous TID WC  . levothyroxine  25 mcg Oral QAC breakfast  . morphine  60 mg Oral Q12H  . multivitamin with minerals  1 tablet Oral Daily  . potassium chloride SA  20 mEq Oral Daily  . predniSONE  5 mg Oral Daily  . tamsulosin  0.8 mg Oral q morning - 10a   Continuous Infusions: . heparin 1,500 Units/hr (10/07/17 0954)     LOS: 2 days    Time spent: 23 Minutes.    Dana Allan, MD  [facility] Pager #: [phone] 7PM-7AM contact night coverage as above

## 2017-10-08 NOTE — Progress Notes (Signed)
Patient refuse NIV for the night. Patient states that he has previously had some dental work done and his mouth, jaws, and face is still sore and tender. He does want to wear mask due to this regard. Wife is at bedside. No distress or complications noted.

## 2017-10-09 ENCOUNTER — Other Ambulatory Visit (HOSPITAL_COMMUNITY): Payer: Medicare Other

## 2017-10-09 ENCOUNTER — Ambulatory Visit (HOSPITAL_COMMUNITY): Payer: Medicare Other

## 2017-10-09 ENCOUNTER — Ambulatory Visit (HOSPITAL_COMMUNITY): Payer: Medicare Other | Admitting: Hematology

## 2017-10-09 LAB — CBC
HCT: 25.7 % — ABNORMAL LOW (ref 39.0–52.0)
Hemoglobin: 8 g/dL — ABNORMAL LOW (ref 13.0–17.0)
MCH: 30.3 pg (ref 26.0–34.0)
MCHC: 31.1 g/dL (ref 30.0–36.0)
MCV: 97.3 fL (ref 78.0–100.0)
Platelets: 135 10*3/uL — ABNORMAL LOW (ref 150–400)
RBC: 2.64 MIL/uL — ABNORMAL LOW (ref 4.22–5.81)
RDW: 18.7 % — ABNORMAL HIGH (ref 11.5–15.5)
WBC: 8.2 10*3/uL (ref 4.0–10.5)

## 2017-10-09 LAB — GLUCOSE, CAPILLARY
Glucose-Capillary: 166 mg/dL — ABNORMAL HIGH (ref 70–99)
Glucose-Capillary: 146 mg/dL — ABNORMAL HIGH (ref 70–99)

## 2017-10-09 LAB — HEPARIN LEVEL (UNFRACTIONATED): Heparin Unfractionated: 0.33 IU/mL (ref 0.30–0.70)

## 2017-10-09 MED ORDER — ENSURE ENLIVE PO LIQD: 237.0000 mL | Freq: Two times a day (BID) | ORAL | 1 refills | Status: AC

## 2017-10-09 MED ORDER — RIVAROXABAN 20 MG PO TABS: 20.0000 mg | ORAL_TABLET | Freq: Every day | ORAL | Status: DC

## 2017-10-09 MED ORDER — HEPARIN SOD (PORK) LOCK FLUSH 100 UNIT/ML IV SOLN
500.0000 [IU] | INTRAVENOUS | Status: AC | PRN
  Administered 2017-10-09: 500 [IU]

## 2017-10-09 MED ORDER — RIVAROXABAN 15 MG PO TABS
15.0000 mg | ORAL_TABLET | Freq: Two times a day (BID) | ORAL | Status: DC
  Administered 2017-10-09: 15 mg via ORAL
  Filled 2017-10-09: qty 1

## 2017-10-09 MED ORDER — RIVAROXABAN (XARELTO) VTE STARTER PACK (15 & 20 MG): ORAL_TABLET | ORAL | 0 refills | Status: DC

## 2017-10-09 NOTE — Discharge Instructions (Signed)
Rivaroxaban oral tablets °What is this medicine? °RIVAROXABAN (ri va ROX a ban) is an anticoagulant (blood thinner). It is used to treat blood clots in the lungs or in the veins. It is also used after knee or hip surgeries to prevent blood clots. It is also used to lower the chance of stroke in people with a medical condition called atrial fibrillation. °This medicine may be used for other purposes; ask your health care provider or pharmacist if you have questions. °COMMON BRAND NAME(S): Xarelto, Xarelto Starter Pack °What should I tell my health care provider before I take this medicine? °They need to know if you have any of these conditions: °-bleeding disorders °-bleeding in the brain °-blood in your stools (black or tarry stools) or if you have blood in your vomit °-history of stomach bleeding °-kidney disease °-liver disease °-low blood counts, like low white cell, platelet, or red cell counts °-recent or planned spinal or epidural procedure °-take medicines that treat or prevent blood clots °-an unusual or allergic reaction to rivaroxaban, other medicines, foods, dyes, or preservatives °-pregnant or trying to get pregnant °-breast-feeding °How should I use this medicine? °Take this medicine by mouth with a glass of water. Follow the directions on the prescription label. Take your medicine at regular intervals. Do not take it more often than directed. Do not stop taking except on your doctor's advice. Stopping this medicine may increase your risk of a blood clot. Be sure to refill your prescription before you run out of medicine. °If you are taking this medicine after hip or knee replacement surgery, take it with or without food. If you are taking this medicine for atrial fibrillation, take it with your evening meal. If you are taking this medicine to treat blood clots, take it with food at the same time each day. If you are unable to swallow your tablet, you may crush the tablet and mix it in applesauce. Then,  immediately eat the applesauce. You should eat more food right after you eat the applesauce containing the crushed tablet. °Talk to your pediatrician regarding the use of this medicine in children. Special care may be needed. °Overdosage: If you think you have taken too much of this medicine contact a poison control center or emergency room at once. °NOTE: This medicine is only for you. Do not share this medicine with others. °What if I miss a dose? °If you take your medicine once a day and miss a dose, take the missed dose as soon as you remember. If you take your medicine twice a day and miss a dose, take the missed dose immediately. In this instance, 2 tablets may be taken at the same time. The next day you should take 1 tablet twice a day as directed. °What may interact with this medicine? °Do not take this medicine with any of the following medications: °-defibrotide °This medicine may also interact with the following medications: °-aspirin and aspirin-like medicines °-certain antibiotics like erythromycin, azithromycin, and clarithromycin °-certain medicines for fungal infections like ketoconazole and itraconazole °-certain medicines for irregular heart beat like amiodarone, quinidine, dronedarone °-certain medicines for seizures like carbamazepine, phenytoin °-certain medicines that treat or prevent blood clots like warfarin, enoxaparin, and dalteparin °-conivaptan °-diltiazem °-felodipine °-indinavir °-lopinavir; ritonavir °-NSAIDS, medicines for pain and inflammation, like ibuprofen or naproxen °-ranolazine °-rifampin °-ritonavir °-SNRIs, medicines for depression, like desvenlafaxine, duloxetine, levomilnacipran, venlafaxine °-SSRIs, medicines for depression, like citalopram, escitalopram, fluoxetine, fluvoxamine, paroxetine, sertraline °-St. John's wort °-verapamil °This list may not describe all   possible interactions. Give your health care provider a list of all the medicines, herbs, non-prescription  drugs, or dietary supplements you use. Also tell them if you smoke, drink alcohol, or use illegal drugs. Some items may interact with your medicine. °What should I watch for while using this medicine? °Visit your doctor or health care professional for regular checks on your progress. °Notify your doctor or health care professional and seek emergency treatment if you develop breathing problems; changes in vision; chest pain; severe, sudden headache; pain, swelling, warmth in the leg; trouble speaking; sudden numbness or weakness of the face, arm or leg. These can be signs that your condition has gotten worse. °If you are going to have surgery or other procedure, tell your doctor that you are taking this medicine. °What side effects may I notice from receiving this medicine? °Side effects that you should report to your doctor or health care professional as soon as possible: °-allergic reactions like skin rash, itching or hives, swelling of the face, lips, or tongue °-back pain °-redness, blistering, peeling or loosening of the skin, including inside the mouth °-signs and symptoms of bleeding such as bloody or black, tarry stools; red or dark-brown urine; spitting up blood or brown material that looks like coffee grounds; red spots on the skin; unusual bruising or bleeding from the eye, gums, or nose °Side effects that usually do not require medical attention (report to your doctor or health care professional if they continue or are bothersome): °-dizziness °-muscle pain °This list may not describe all possible side effects. Call your doctor for medical advice about side effects. You may report side effects to FDA at 1-800-FDA-1088. °Where should I keep my medicine? °Keep out of the reach of children. °Store at room temperature between 15 and 30 degrees C (59 and 86 degrees F). Throw away any unused medicine after the expiration date. °NOTE: This sheet is a summary. It may not cover all possible information. If you  have questions about this medicine, talk to your doctor, pharmacist, or health care provider. °© 2018 [employer] (2015-11-07 16:29:33) ° °

## 2017-10-09 NOTE — Care Management Note (Signed)
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 Patient lives at home with spouse, ambulates with a cane and actively being followed by oncologist Dr. [name] for chemo treatments. Patient confirmed local PCP: Dr. Pleas Koch; Pharmacy: [facility] in [place], [place].                     Action/Plan: CM consult received for Xarelto benefits check, with request pending. CM met with patient/spouse to discuss post hospital transitional needs. Patient will return home with spouse, with spouse continuing to provide assistance as needed with patient's ADLs. Patient verbalized he'll continue chemo treatments with Dr. [name] on next week. CM will continue to follow for transitional needs.   Expected Discharge Date:  10/09/17               Expected Discharge Plan:  Home/Self Care  In-House Referral:  NA  Discharge planning Services  CM Consult, Medication Assistance(Xarelto benefits check )  Post Acute Care Choice:  NA Choice offered to:  NA  DME Arranged:  N/A DME Agency:  NA  HH Arranged:  NA HH Agency:  NA  Status of Service:  Completed, signed off  If discussed at [place] of Stay Meetings, dates discussed:    Additional Comments:  Georgeanna Lea, RN 10/09/2017, 10:20 AM

## 2017-10-09 NOTE — Care Management (Signed)
Per rep at [facility] is a tier 3 medication/ no auth required/ patient is responsible for 25% of total cost of medication. Estimated co-pay is: 20mg  QD: $117.72 for 30 day retail  // $211.75 for 90 day mail order 15mg  BID: $235.32 for 30 day retail // $461.25 for 90 day mail order

## 2017-10-09 NOTE — Care Management Important Message (Signed)
Important Message  Patient Details  Name: COLLIE WERNICK MRN: [id number] Date of Birth: 25-Mar-1945   Medicare Important Message Given:  Yes    Param Capri P Daneya Hartgrove 10/09/2017, 3:05 PM

## 2017-10-09 NOTE — Care Management Note (Addendum)
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 Patient lives at home with spouse, ambulates with a cane and actively being followed by oncologist Dr. [name] for chemo treatments. Patient confirmed local PCP: Dr. Pleas Koch; Pharmacy: [facility] in [place], [place].                     Action/Plan: CM consult received for Xarelto benefits check, with request pending. CM met with patient/spouse to discuss post hospital transitional needs. Patient will return home with spouse, with spouse continuing to provide assistance as needed with patient's ADLs. Patient verbalized he'll continue chemo treatments with Dr. [name] on next week. CM will continue to follow for transitional needs.   Expected Discharge Date:  10/09/17               Expected Discharge Plan:  Home/Self Care  In-House Referral:  NA  Discharge planning Services  CM Consult, Medication Assistance(Xarelto benefits check )  Post Acute Care Choice:  NA Choice offered to:  NA  DME Arranged:  N/A DME Agency:  NA  HH Arranged:  NA HH Agency:  NA  Status of Service:  Completed, signed off  If discussed at [place] of Stay Meetings, dates discussed:    Additional Comments: 10/09/17 1211: Xarelto benefits check received. CM contacted patient's local pharmacy and [facility] in [place], [place] (per patient's request) to confirm availability of the ordered Xarelto starter packs, with Xarelto starter packs unavailable at both pharmacies with patient notified. CM contacted [facility] at [place] to confirm availability of a Xarelto starter pack with Rx available. CM faxed copies of the Xarelto Rx and patient's facesheet to [facility], with the 30 day free  Xarelto card provided to patient and CM informed patient/spouse that the estimated benefits check co-pay is: 42m QD: $117.72 for 30 day retail  // $211.75 for 90 day mail order 16mBID: $235.32 for 30 day retail // $461.25 for 90 day mail order Patient/spouse verbalized understanding of the estimated cost if Xarelto is continued after utilizing his 30 day free Xarelto card.CM will sign off.   NaGeorgeanna LeaRN 10/09/2017, 12:11 PM

## 2017-10-09 NOTE — Discharge Summary (Addendum)
Physician Discharge Summary  OTIS PORTAL PPJ:[id number] DOB: February 15, 1946 DOA: 10/06/2017  PCP: [name], MD  Admit date: 10/06/2017 Discharge date: 10/09/2017  Recommendations for Outpatient Follow-up:  1. Check CBC and BMP during next visit to oncologist 2. Take xarelto as prescribed. If you notice significant bleeding per rectum or from mouth or nose please stop taking this medication and call your doctor immediately. 3. Follow up with your oncologist per scheduled appointment.  Discharge Diagnoses:  Principal Problem:   Acute pulmonary embolism (HCC) Active Problems:   Prostate cancer metastatic to bone (HCC)   Gastroesophageal reflux disease   Sleep apnea   Hypertension   Thyroid disease   Diabetes mellitus without complication (HCC)   Subdural hematoma (HCC)    Discharge Condition: stable   Diet recommendation: as tolerated   History of present illness:  72 year old Caucasian male, with past medical history significant for type 2 diabetes, hypertension, prostate cancer with metastases to bone, sleep apnea on CPAP, thyroid disease who presented to hospital after his scan showed pulmonary embolism. CT scan of the head is said to revealed a small dural hematoma. Per oncology, okay to continue xarelto on discahrge.   Hospital Course:   Principal Problem:   Acute pulmonary embolism ([place]) - Submassive PE on CT scan with evidence of right heart strain - Tolerated heparin and per oncology okay to continue xarelto on discharge - Follow up blood work in oncology office   Active Problems:   Prostate cancer metastatic to bone [facility]) - Per oncology management  - Continue Lupron    Diabetes mellitus without complication ([place]) - Continue metformin  Code status: DNR/DNI   Signed:  Leisa Lenz, MD  [facility] 10/09/2017, 9:00 AM  Pager #: [phone]  Time spent in minutes: 35 minutes  Procedures:  None   Consultations:  Oncology   Discharge  Exam: Vitals:   10/09/17 0516 10/09/17 0757  BP: 125/63 107/78  Pulse: 69 70  Resp: 19 20  Temp: 98.6 F (37 C) 98.5 F (36.9 C)  SpO2: 98% 96%   Vitals:   10/08/17 2049 10/08/17 2320 10/09/17 0516 10/09/17 0757  BP: 132/71 125/74 125/63 107/78  Pulse: 74 75 69 70  Resp: 20 15 19 20   Temp: 99.2 F (37.3 C) 100.2 F (37.9 C) 98.6 F (37 C) 98.5 F (36.9 C)  TempSrc: Oral Oral Oral Oral  SpO2: 94% 95% 98% 96%  Weight:   98.1 kg   Height:        General: Pt is alert, follows commands appropriately, not in acute distress Cardiovascular: Regular rate and rhythm, S1/S2 (+) Respiratory: Clear to auscultation bilaterally, no wheezing, no crackles, no rhonchi Abdominal: Soft, non tender, non distended, bowel sounds +, no guarding Extremities: no edema, no cyanosis, pulses palpable bilaterally DP and PT Neuro: Grossly nonfocal  Discharge Instructions  Discharge Instructions    Call MD for:  persistant dizziness or light-headedness   Complete by:  As directed    Call MD for:  severe uncontrolled pain   Complete by:  As directed    Diet - low sodium heart healthy   Complete by:  As directed    Discharge instructions   Complete by:  As directed    Take xarelto as prescribed. If you notice significant bleeding per rectum or from mouth or nose please stop taking this medication and call your doctor immediately. Follow up with your oncologist per scheduled appointment.   Increase activity slowly   Complete by:  As directed      Allergies as of 10/09/2017   No Known Allergies     Medication List    STOP taking these medications   pegfilgrastim-cbqv 6 MG/0.6ML injection Commonly known as:  UDENYCA   XGEVA 120 MG/1.7ML Soln injection Generic drug:  denosumab     TAKE these medications   atenolol 100 MG tablet Commonly known as:  TENORMIN Take 100 mg by mouth daily.   atorvastatin 20 MG tablet Commonly known as:  LIPITOR Take 10 mg by mouth every morning.   calcium  carbonate 1250 (500 Ca) MG tablet Commonly known as:  OS-CAL - dosed in mg of elemental calcium Take 1 tablet by mouth 2 (two) times daily.   diltiazem 300 MG 24 hr capsule Commonly known as:  CARDIZEM CD Take 300 mg by mouth daily.   feeding supplement (ENSURE ENLIVE) Liqd Take 237 mLs by mouth 2 (two) times daily between meals.   FISH OIL PO Take 1 capsule by mouth daily.   gabapentin 300 MG capsule Commonly known as:  NEURONTIN Patient may take 1 capsule in the morning and 2 capsules @ bedtime. What changed:    how much to take  how to take this  when to take this   GLUCOSAMINE 1500 COMPLEX PO Take 1 tablet by mouth 2 (two) times daily.   KLOR-CON M20 20 MEQ tablet Generic drug:  potassium chloride SA TAKE 1 TABLET BY MOUTH ONCE DAILY What changed:    how much to take  how to take this  when to take this   leuprolide 7.5 MG injection Commonly known as:  LUPRON Inject 7.5 mg into the muscle every 28 (twenty-eight) days.   levothyroxine 25 MCG tablet Commonly known as:  SYNTHROID, LEVOTHROID Take 25 mcg by mouth daily before breakfast.   loperamide 1 MG/5ML solution Commonly known as:  IMODIUM Take 1 mg by mouth as needed for diarrhea or loose stools.   Lysine 500 MG Caps Take 500 mg by mouth daily.   magic mouthwash w/lidocaine Soln 1 part of each of the following: Benadryl 12.5mg  /52ml, Viscous lidocaine 2%, Maalox. Swish and swallow 5 mL QID. What changed:    how much to take  how to take this  when to take this   metFORMIN 500 MG tablet Commonly known as:  GLUCOPHAGE Take 500 mg by mouth 2 (two) times daily with a meal.   morphine 30 MG tablet Commonly known as:  MSIR Take 1 tablet (30 mg total) by mouth every 6 (six) hours as needed for severe pain.   morphine 60 MG 12 hr tablet Commonly known as:  MS CONTIN Take 1 tablet (60 mg total) by mouth every 12 (twelve) hours.   multivitamin with minerals Tabs tablet Take 1 tablet by mouth  daily.   ondansetron 8 MG disintegrating tablet Commonly known as:  ZOFRAN-ODT Take 1 tablet (8 mg total) by mouth every 8 (eight) hours as needed for nausea or vomiting.   predniSONE 5 MG tablet Commonly known as:  DELTASONE Take 5 mg by mouth daily.   Rivaroxaban 15 & 20 MG Tbpk Take as directed on package: Start with one 15mg  tablet by mouth twice a day with food. On Day 22, switch to one 20mg  tablet once a day with food.   tamsulosin 0.4 MG Caps capsule Commonly known as:  FLOMAX Take 0.8 mg by mouth every morning.   TAXOTERE IV Inject into the vein every 21 ( twenty-one) days.  triamterene-hydrochlorothiazide 37.5-25 MG tablet Commonly known as:  MAXZIDE-25 Take 1 tablet by mouth daily. for high blood pressure         The results of significant diagnostics from this hospitalization (including imaging, microbiology, ancillary and laboratory) are listed below for reference.    Significant Diagnostic Studies: Ct Head Wo Contrast  Result Date: 10/06/2017 CLINICAL DATA:  History prostate cancer on chemotherapy currently. Pulmonary embolism today. EXAM: CT HEAD WITHOUT CONTRAST TECHNIQUE: Contiguous axial images were obtained from the base of the skull through the vertex without intravenous contrast. COMPARISON:  10/20/2012 FINDINGS: Brain: Ventricles and cisterns are within normal. There is a small right convexity subdural hematoma likely subacute in nature measuring 7 mm in thickness over the frontal region just above the level of the lateral ventricles. Very subtle local mass effect and no significant midline shift. No evidence of mass. No acute infarction. Vascular: No hyperdense vessel or unexpected calcification. Skull: Normal. Negative for fracture or focal lesion. Sinuses/Orbits: Orbits are within normal. There is opacification of the floor the right maxillary sinus likely chronic inflammatory change. Other: None. IMPRESSION: Small right convexity subdural hematoma likely  subacute in nature with minimal local mass effect, but no significant midline shift. This measures 7 mm in thickness. Critical Value/emergent results were called by telephone at the time of interpretation on 10/06/2017 at 6:12 pm to Dr. Merrily Pew , who verbally acknowledged these results. Electronically Signed   By: Marin Olp M.D.   On: 10/06/2017 18:12   Nm Bone Scan Whole Body  Result Date: 09/28/2017 CLINICAL DATA:  Prostate carcinoma.  Bone metastasis.  Recent fall EXAM: NUCLEAR MEDICINE WHOLE BODY BONE SCAN TECHNIQUE: Whole body anterior and posterior images were obtained approximately 3 hours after intravenous injection of radiopharmaceutical. RADIOPHARMACEUTICALS:  20.0 mCi Technetium-7m MDP IV COMPARISON:  Bone scan 08/24/2017 FINDINGS: Metastatic deposits in the RIGHT humerus and throughout the RIGHT femur unchanged. Proximal lesion LEFT humerus unchanged. Sternal rib lesions unchanged. RIGHT calvarial lesions unchanged. Lesion the mid LEFT femur unchanged. IMPRESSION: 1. No new skeletal lesions. 2. Widespread skeletal metastasis within the axillary appendicular skeleton unchanged. 3. No evidence of traumatic injury. Electronically Signed   By: Suzy Bouchard M.D.   On: 09/28/2017 18:13   Ct Abdomen Pelvis W Contrast  Result Date: 10/06/2017 CLINICAL DATA:  Restaging metastatic prostate cancer with ongoing chemotherapy. Metastatic disease to the liver and skeleton. Low back pain and bilateral leg/hip pain. EXAM: CT ABDOMEN AND PELVIS WITH CONTRAST TECHNIQUE: Multidetector CT imaging of the abdomen and pelvis was performed using the standard protocol following bolus administration of intravenous contrast. CONTRAST:  132mL ISOVUE-300 IOPAMIDOL (ISOVUE-300) INJECTION 61% COMPARISON:  Multiple exams, including 08/25/2017 FINDINGS: Lower chest: Mild cardiomegaly. Scarring anteriorly in the right lower lobe. Atherosclerotic calcification of the descending thoracic aorta. There is abnormal  hypodensity and expansion of right lower lobe pulmonary arterial branches compatible with acute pulmonary embolus. Right ventricular to left ventricular ratio 1.0. Small amount of contrast in the distal esophagus. Hepatobiliary: Increased central necrosis and an index 5.1 by 5.5 cm lesion primarily in segment 4 of the liver on image 19/2, previously 5.2 by 5.5 cm. The other liver lesions appear stable from 08/25/2017. No definite new lesions, although some of the lesions are isodense to the liver and thus difficult to discern. Pancreas: Unremarkable Spleen: Unremarkable Adrenals/Urinary Tract: Stable right kidney upper pole fluid density lesion favoring cyst. Small left mid kidney lesion posterolaterally is stable and technically nonspecific due to small size. Adrenal glands unremarkable.  Stomach/Bowel: Unremarkable Vascular/Lymphatic: Aortoiliac atherosclerotic vascular disease. No pathologic adenopathy in the abdomen/pelvis. Reproductive: The prostate gland is not enlarged and has a stable contour without asymmetry. Seminal vesicles do not appear asymmetric. Other: No supplemental non-categorized findings. Musculoskeletal: Diffuse sclerosis of the skeleton compatible with prior widespread osseous metastatic disease. 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 Bilateral foraminal impingement at L5-S1. Lumbar spondylosis and degenerative disc disease noted with additional impingement suspected at L3-4 and possibly L2-3. Old healed left posterior rib fractures. IMPRESSION: 1. Acute pulmonary embolus in the visualized portion of the right lower lobe. Positive for acute PE with CT evidence of right heart strain (RV/LV Ratio = 1.0) consistent with at least submassive (intermediate risk) PE. The presence of right heart strain has been associated with an increased risk of morbidity and mortality. The patient should be transported to the emergency room, preferably by EMS. Treating personnel: Please  activate Code PE by paging [phone]. 2. Similar appearance of scattered hepatic metastatic lesions. Although unchanged in size, a dominant lesion primarily in segment 4 of the liver does demonstrate some increase in central necrosis compared to previous. 3. Diffuse skeletal sclerosis compatible with prior widespread osseous metastatic disease. 4. Other imaging findings of potential clinical significance: Aortic Atherosclerosis (ICD10-I70.0). Mild cardiomegaly. 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 Foraminal impingement at L5-S1 and L3-4, and possibly L2-3. Critical Value/emergent results were called by telephone at the time of interpretation on 10/06/2017 at 3:45 pm to Dr. Tera Helper, who verbally acknowledged these results. Electronically Signed   By: Van Clines M.D.   On: 10/06/2017 15:46    Microbiology: No results found for this or any previous visit (from the past 240 hour(s)).   Labs: Basic Metabolic Panel: Recent Labs  Lab 10/06/17 1705  NA 136  K 4.1  CL 102  CO2 25  GLUCOSE 172*  BUN 15  CREATININE 0.83  CALCIUM 8.7*   Liver Function Tests: Recent Labs  Lab 10/06/17 1705  AST 38  ALT 15  ALKPHOS 136*  BILITOT 0.7  PROT 6.1*  ALBUMIN 3.4*   No results for input(s): LIPASE, AMYLASE in the last 168 hours. No results for input(s): AMMONIA in the last 168 hours. CBC: Recent Labs  Lab 10/06/17 1705 10/08/17 0312 10/09/17 0411  WBC 6.7 8.1 8.2  NEUTROABS 5.5  --   --   HGB 8.6* 8.1* 8.0*  HCT 27.3* 25.2* 25.7*  MCV 96.8 96.2 97.3  PLT 133* 144* 135*   Cardiac Enzymes: Recent Labs  Lab 10/06/17 1705 10/06/17 2308  TROPONINI <0.03 <0.03   BNP: BNP (last 3 results) Recent Labs    10/06/17 1705  BNP 223.0*    ProBNP (last 3 results) No results for input(s): PROBNP in the last 8760 hours.  CBG: Recent Labs  Lab 10/08/17 0625 10/08/17 1158 10/08/17 1647 10/08/17 2146 10/09/17 0624  GLUCAP 145* 153* 155* 130* 166*

## 2017-10-12 ENCOUNTER — Other Ambulatory Visit (HOSPITAL_COMMUNITY): Payer: Self-pay | Admitting: *Deleted

## 2017-10-12 DIAGNOSIS — C61 Malignant neoplasm of prostate: Secondary | ICD-10-CM

## 2017-10-12 DIAGNOSIS — C7951 Secondary malignant neoplasm of bone: Principal | ICD-10-CM

## 2017-10-12 MED ORDER — MORPHINE SULFATE ER 60 MG PO TBCR: 60.0000 mg | EXTENDED_RELEASE_TABLET | Freq: Two times a day (BID) | ORAL | 0 refills | Status: DC

## 2017-10-19 ENCOUNTER — Inpatient Hospital Stay (HOSPITAL_COMMUNITY): Payer: Medicare Other | Attending: Hematology

## 2017-10-19 ENCOUNTER — Encounter (HOSPITAL_COMMUNITY): Payer: Self-pay | Admitting: Hematology

## 2017-10-19 ENCOUNTER — Inpatient Hospital Stay (HOSPITAL_BASED_OUTPATIENT_CLINIC_OR_DEPARTMENT_OTHER): Payer: Medicare Other | Admitting: Hematology

## 2017-10-19 ENCOUNTER — Inpatient Hospital Stay (HOSPITAL_COMMUNITY): Payer: Medicare Other

## 2017-10-19 VITALS — BP 130/61 | HR 57 | Temp 98.1°F | Resp 18

## 2017-10-19 VITALS — BP 144/69 | HR 69 | Temp 98.0°F | Resp 18 | Wt 213.6 lb

## 2017-10-19 DIAGNOSIS — C61 Malignant neoplasm of prostate: Secondary | ICD-10-CM

## 2017-10-19 DIAGNOSIS — I2699 Other pulmonary embolism without acute cor pulmonale: Secondary | ICD-10-CM | POA: Insufficient documentation

## 2017-10-19 DIAGNOSIS — I10 Essential (primary) hypertension: Secondary | ICD-10-CM | POA: Diagnosis not present

## 2017-10-19 DIAGNOSIS — T451X5A Adverse effect of antineoplastic and immunosuppressive drugs, initial encounter: Secondary | ICD-10-CM

## 2017-10-19 DIAGNOSIS — G893 Neoplasm related pain (acute) (chronic): Secondary | ICD-10-CM

## 2017-10-19 DIAGNOSIS — C7951 Secondary malignant neoplasm of bone: Principal | ICD-10-CM

## 2017-10-19 DIAGNOSIS — Z5111 Encounter for antineoplastic chemotherapy: Secondary | ICD-10-CM | POA: Diagnosis not present

## 2017-10-19 DIAGNOSIS — D6481 Anemia due to antineoplastic chemotherapy: Secondary | ICD-10-CM | POA: Diagnosis not present

## 2017-10-19 DIAGNOSIS — E1140 Type 2 diabetes mellitus with diabetic neuropathy, unspecified: Secondary | ICD-10-CM | POA: Insufficient documentation

## 2017-10-19 DIAGNOSIS — E079 Disorder of thyroid, unspecified: Secondary | ICD-10-CM | POA: Insufficient documentation

## 2017-10-19 DIAGNOSIS — I2510 Atherosclerotic heart disease of native coronary artery without angina pectoris: Secondary | ICD-10-CM

## 2017-10-19 DIAGNOSIS — Z79899 Other long term (current) drug therapy: Secondary | ICD-10-CM

## 2017-10-19 DIAGNOSIS — G620 Drug-induced polyneuropathy: Secondary | ICD-10-CM | POA: Diagnosis not present

## 2017-10-19 LAB — CBC WITH DIFFERENTIAL/PLATELET
BASOS PCT: 0 %
Basophils Absolute: 0 10*3/uL (ref 0.0–0.1)
Eosinophils Absolute: 0.1 10*3/uL (ref 0.0–0.7)
Eosinophils Relative: 2 %
HEMATOCRIT: 31.2 % — AB (ref 39.0–52.0)
Hemoglobin: 9.6 g/dL — ABNORMAL LOW (ref 13.0–17.0)
Lymphocytes Relative: 18 %
Lymphs Abs: 1 10*3/uL (ref 0.7–4.0)
MCH: 29.4 pg (ref 26.0–34.0)
MCHC: 30.8 g/dL (ref 30.0–36.0)
MCV: 95.7 fL (ref 78.0–100.0)
MONO ABS: 0.5 10*3/uL (ref 0.1–1.0)
MONOS PCT: 8 %
NEUTROS ABS: 4.2 10*3/uL (ref 1.7–7.7)
Neutrophils Relative %: 72 %
Platelets: 319 10*3/uL (ref 150–400)
RBC: 3.26 MIL/uL — ABNORMAL LOW (ref 4.22–5.81)
RDW: 17.7 % — AB (ref 11.5–15.5)
WBC: 5.8 10*3/uL (ref 4.0–10.5)

## 2017-10-19 LAB — COMPREHENSIVE METABOLIC PANEL
ALBUMIN: 3.3 g/dL — AB (ref 3.5–5.0)
ALK PHOS: 172 U/L — AB (ref 38–126)
ALT: 26 U/L (ref 0–44)
ANION GAP: 11 (ref 5–15)
AST: 23 U/L (ref 15–41)
BILIRUBIN TOTAL: 0.6 mg/dL (ref 0.3–1.2)
BUN: 17 mg/dL (ref 8–23)
CALCIUM: 9.2 mg/dL (ref 8.9–10.3)
CO2: 27 mmol/L (ref 22–32)
Chloride: 102 mmol/L (ref 98–111)
Creatinine, Ser: 0.81 mg/dL (ref 0.61–1.24)
GFR calc Af Amer: >60 mL/min (ref >60)
Glucose, Bld: 131 mg/dL — ABNORMAL HIGH (ref 70–99)
POTASSIUM: 4.2 mmol/L (ref 3.5–5.1)
Sodium: 140 mmol/L (ref 135–145)
TOTAL PROTEIN: 6.9 g/dL (ref 6.5–8.1)

## 2017-10-19 LAB — PSA: Prostatic Specific Antigen: 191 ng/mL — ABNORMAL HIGH (ref 0.00–4.00)

## 2017-10-19 MED ORDER — SODIUM CHLORIDE 0.9 % IV SOLN: 10.0000 mg | Freq: Once | INTRAVENOUS | Status: DC

## 2017-10-19 MED ORDER — HEPARIN SOD (PORK) LOCK FLUSH 100 UNIT/ML IV SOLN
500.0000 [IU] | Freq: Once | INTRAVENOUS | Status: AC | PRN
  Administered 2017-10-19: 500 [IU]
  Filled 2017-10-19: qty 5

## 2017-10-19 MED ORDER — SODIUM CHLORIDE 0.9 % IV SOLN
Freq: Once | INTRAVENOUS | Status: AC
  Administered 2017-10-19: 10:00:00 via INTRAVENOUS

## 2017-10-19 MED ORDER — DEXAMETHASONE SODIUM PHOSPHATE 10 MG/ML IJ SOLN
10.0000 mg | Freq: Once | INTRAMUSCULAR | Status: AC
  Administered 2017-10-19: 10 mg via INTRAVENOUS
  Filled 2017-10-19: qty 1

## 2017-10-19 MED ORDER — DIPHENHYDRAMINE HCL 50 MG/ML IJ SOLN
25.0000 mg | Freq: Once | INTRAMUSCULAR | Status: AC
Start: 2017-10-19 — End: 2017-10-19
  Administered 2017-10-19: 25 mg via INTRAVENOUS
  Filled 2017-10-19: qty 1

## 2017-10-19 MED ORDER — SODIUM CHLORIDE 0.9 % IV SOLN
60.0000 mg/m2 | Freq: Once | INTRAVENOUS | Status: AC
  Administered 2017-10-19: 130 mg via INTRAVENOUS
  Filled 2017-10-19: qty 13

## 2017-10-19 MED ORDER — GABAPENTIN 300 MG PO CAPS: ORAL_CAPSULE | ORAL | 3 refills | Status: DC

## 2017-10-19 MED ORDER — ONDANSETRON 8 MG PO TBDP
8.0000 mg | ORAL_TABLET | Freq: Once | ORAL | Status: AC
  Administered 2017-10-19: 8 mg via ORAL
  Filled 2017-10-19: qty 1

## 2017-10-19 MED ORDER — ONDANSETRON HCL 40 MG/20ML IJ SOLN: Freq: Once | INTRAMUSCULAR | Status: DC

## 2017-10-19 MED ORDER — SODIUM CHLORIDE 0.9% FLUSH
10.0000 mL | INTRAVENOUS | Status: DC | PRN
  Administered 2017-10-19: 10 mL
  Filled 2017-10-19: qty 10

## 2017-10-19 MED ORDER — PEGFILGRASTIM 6 MG/0.6ML ~~LOC~~ PSKT
6.0000 mg | PREFILLED_SYRINGE | Freq: Once | SUBCUTANEOUS | Status: AC
  Administered 2017-10-19: 6 mg via SUBCUTANEOUS
  Filled 2017-10-19: qty 0.6

## 2017-10-19 NOTE — Assessment & Plan Note (Addendum)
1.  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 - BRCA 1/2 and other mutations negative ([facility]), PDL 1 expression of 0% -Foundation 1 testing shows MS-stable, TMB intermediate, KRAS G12D, AKT3 amplification, no other significant alterations to modify therapy. -Most recent progression on cabazitaxel.  He has used [place] in the past.  He had received 2 cycles of docetaxel in the beginning.  It is unclear whether this was discontinued secondary to progression of disease.   -Cycle 1 of docetaxel at 20% dose reduction started on 06/25/2017.  He received cycle 2 on 07/16/2017.  Cycle 3 was on 08/06/2017. -We have reviewed the results of the CT scan and bone scan.  CT scan dated 08/25/2017 was compared to scans done on 05/06/2017.  There was addition of 2 new small lesions in the liver.  The dominant lesion appears bigger due to necrosis.  As there was 6 to 8 weeks delay in starting his first cycle of chemotherapy from the scans, these lesions could have grown during that time.  However his PSA has come down from 196 to161 at cycle 3.  He received cycle 4 on 08/27/2017.  Last treatment was cycle 5 on 09/17/2017. - We discussed the results of the CT scan of the abdomen and pelvis dated 10/06/2017 which showed similar appearance of scattered liver metastatic lesions.  Although unchanged in size, dominant lesion in segment 4 of the liver does demonstrate some increased central necrosis compared to previous.  Diffuse skeletal metastasis are stable. - He may proceed with cycle 6 today without any dose modifications.  PSA level from today is pending.  Last PSA on 09/17/2017 was 184.  PSA prior to start of docetaxel was 196.  He will also receive Lupron injection today.  2.  Peripheral neuropathy: He has constant numbness in the feet which is stable.  This is from prior chemotherapy.  He takes gabapentin 1 tablet in the morning and 2 tablets at  bedtime. This has not gotten worse after start of docetaxel.  3.  Chemotherapy-induced anemia: -He has normocytic anemia.  He received Feraheme second infusion on 09/17/2017.  Hemoglobin today improved to 9.6.  4.  Cancer related pain: -We have refilled his morphine ER 60 mg every 12 hours.  He has morphine IR 30 mg to be taken as needed.  He has not required any in the last 2 weeks.  5.  Incidental pulmonary embolism: - Diagnosed on a CT scan of the abdomen and pelvis on 10/06/2017 done for follow-up of prostate cancer, showing right lower lobe pulmonary embolus.  CT evidence of right heart strain.  He was hospitalized, started on IV heparin, transition to Xarelto. -He is tolerating Xarelto very well.  6.  Bone metastasis: - He was receiving Xgeva.  He had tooth pulled 2 weeks ago.  Hence will hold his Xgeva at this time.  We may resume it in 3 weeks.

## 2017-10-19 NOTE — Progress Notes (Signed)
1005 Labs reviewed with and pt seen by Dr. [name] today and pt approved for chemo tx with Xgeva injection to be held due to recent dental procedure per MD

## 2017-10-19 NOTE — Progress Notes (Signed)
[place], [place] [address]   CLINIC:  Medical Oncology/Hematology  PCP:  [name], MD [place] [address] [phone]   REASON FOR VISIT:  Follow-up for metastatic prostate cancer  CURRENT THERAPY: Docetaxel every 3 weeks  BRIEF ONCOLOGIC HISTORY:    Prostate cancer metastatic to bone ([place])   10/01/2012 Initial Diagnosis    Prostate biopsied with highest Gleason score of 9 seen and the lowest score was 7.    10/04/2012 - 05/16/2013 Chemotherapy    Depo-Lupron and Casodex initiated    05/16/2013 -  Chemotherapy    Depo-Lupron monthly continued    05/16/2013 Progression    Progression by PSA elevation    05/16/2013 - 10/22/2014 Chemotherapy    Abiraterone and prednisone initiated in conjunction with ongoing Depo-Lupron.  Denosumab also ongoing.    10/23/2014 Progression    PSA increasing from 0.2- 1.6 in less than 6 months.      10/23/2014 - 01/30/2015 Chemotherapy    Enzalutamide and Prednisone (5 mg in AM and 2.5 mg in PM)    01/30/2015 Imaging    Bone scan- New focus of intense activity in right proximal humerus.  Interim increase in activity over left hip.    01/30/2015 Progression    Bone scan reveals new disease in right humerus consistent with progression of disease    01/31/2015 Imaging    Right humerus xray- blastic foci in proximal right humeral metaphysis and over right mid-humeral diaphysis.  No evidence of fracture    07/06/2015 Progression    Progression in multiple bones especially L hip and femurs    07/06/2015 Imaging    Bone scan- progressive multifocal osseous metastases in the right proximal femora and distal femoral shafts.  Stable update in bilateral ribs suspicious for small rib metastases    07/16/2015 - 07/31/2015 Radiation Therapy    Left femur 30 Gy in 10 fractions by Dr. Tammi Klippel    11/23/2015 - 01/04/2016 Chemotherapy    The patient had pegfilgrastim (NEULASTA ONPRO KIT) injection 6 mg, 6  mg, Subcutaneous, Once, 3 of 7 cycles  DOCEtaxel (TAXOTERE) 180 mg in dextrose 5 % 250 mL chemo infusion, 75 mg/m2 = 180 mg, Intravenous,  Once, 3 of 7 cycles Dose modification: 64 mg/m2 (original dose 75 mg/m2, Cycle 2, Reason: Dose not tolerated)  pegfilgrastim (NEULASTA ONPRO KIT) injection 6 mg, 6 mg, Subcutaneous, Once, 0 of 4 cycles  cabazitaxel (JEVTANA) 60 mg in dextrose 5 % 250 mL chemo infusion, 25 mg/m2, Intravenous,  Once, 0 of 4 cycles  for chemotherapy treatment.      11/30/2015 Adverse Reaction    Diarrhea (secondary to chemotherapy) and dehydration requiring IV fluids    12/14/2015 Treatment Plan Change    Docetaxel dose reduced by 15%    12/31/2015 Procedure    Port placed by Dr. Arnoldo Morale    01/28/2016 -  Chemotherapy    Cabazitaxel (Jevtana)     03/27/2016 Imaging    CT Chest, Abdomen, and Pelvis with contrast 1. Diffuse sclerotic osseous metastatic disease in the chest, abdomen, and pelvis without acute fracture identified. 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 These appear chronic. 2. The prostate gland is normal in size and no adenopathy is currently identified. 3. On a prior MRI of 10/04/2012, there was a posterior right hepatic lobe lesion. This lesion is not currently visible on today' s CT. This could be due to differences  in cons acuity between CT or MRI, or resolution of the lesion. 4. Coronary, aortic arch, and branch vessel atherosclerotic vascular disease. Aortoiliac atherosclerotic vascular disease. 5. Single bilateral renal cysts.    04/16/2016 Imaging    Bone scan- Findings consist with progressive metastatic disease. Activity over the proximal right humerus and proximal bilateral femurs are worrisome for the possible development of pathologic fractures.    08/21/2016 Imaging    CT C/A/P: IMPRESSION: No significant change since 03/27/2016 CT. Diffuse bony metastases without other evidence of metastatic  disease.    08/21/2016 Imaging    Bone Scan: IMPRESSION: Multiple areas of increased activity again noted throughout the axial and appendicular skeleton in similar locations as prior exam. Intensity of uptake is increased from prior exam suggesting progressive disease. Lesions present in the proximal humeri, proximal femurs, and the mid right femur susceptible to pathologic fracture.    12/02/2016 Imaging    CT C/A/P: IMPRESSION: 1. Overall stable appearance of diffuse osseous metastatic disease and resulting patchy sclerosis. 2. The patient had a posterior right hepatic lobe lesion on prior MRI from 2014 which is been relatively occult on CT. Given the lack of progression I suspect that this is benign or has been effectively treated. 3. Aortic Atherosclerosis (ICD10-I70.0). Coronary atherosclerosis with mild cardiomegaly. 4. Cholelithiasis. 5. Bilateral benign renal cysts. 6. 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    12/02/2016 Imaging    Bone Scan: IMPRESSION: 1. Widespread osseous metastatic disease. Multiplicity is similar to previous exam with interval increase in degree of tracer uptake associated with multiple lesions.    03/11/2017 Imaging    Bone Scan: Multifocal skeletal disease without sign of progression CT C/A/P: Diffuse sclerotic skeletal lesions, unchanged from the previous.  No new lymphadenopathy, pulmonary nodules, or hepatic lesions to suggest soft tissue progressive disease    06/18/2017 -  Chemotherapy    The patient had pegfilgrastim (NEULASTA ONPRO KIT) injection 6 mg, 6 mg, Subcutaneous, Once, 6 of 8 cycles Administration: 6 mg (06/25/2017), 6 mg (07/16/2017), 6 mg (08/06/2017), 6 mg (08/27/2017), 6 mg (09/17/2017), 6 mg (10/19/2017) DOCEtaxel (TAXOTERE) 130 mg in sodium chloride 0.9 % 250  mL chemo infusion, 60 mg/m2 = 130 mg (80 % of original dose 75 mg/m2), Intravenous,  Once, 6 of 8 cycles Dose modification: 60 mg/m2 (80 % of original dose 75 mg/m2, Cycle 1, Reason: Provider Judgment) Administration: 130 mg (06/25/2017), 130 mg (07/16/2017), 130 mg (08/06/2017), 130 mg (08/27/2017), 130 mg (09/17/2017), 130 mg (10/19/2017) ondansetron (ZOFRAN) 8 mg in sodium chloride 0.9 % 50 mL IVPB, , Intravenous,  Once, 6 of 8 cycles  for chemotherapy treatment.       CANCER STAGING: Cancer Staging Prostate cancer metastatic to bone [facility]) Staging form: Prostate, AJCC 7th Edition - Clinical: No stage assigned - Unsigned    INTERVAL HISTORY:  Mr. Kina 72 y.o. male returns for routine follow-up metastatic prostate cancer and consideration for next cycle of chemotherapy. Patient is here today with his wife. Patient is doing well with his treatment. He is on xaltero and he is tolerating them well. He is hoping to transition to oral soon. Patient is very SOB with exertion. He has not fallen recently and has been walking with his cane for more stability. He is dizzy when he stands and when he has to walk short distances. He is  still needing pain medication throughout the day to help control his back pain. He had two teeth pulled about 2 weeks ago. He denies nausea, vomiting, or diarrhea. Denies any new pain.     REVIEW OF SYSTEMS:  Review of Systems  Constitutional: Positive for fatigue.  HENT:   Positive for trouble swallowing.   Respiratory: Positive for shortness of breath.   Cardiovascular: Positive for leg swelling.  Neurological: Positive for dizziness and numbness.  Hematological: Bruises/bleeds easily.  All other systems reviewed and are negative.    PAST MEDICAL/SURGICAL HISTORY:  Past Medical History:  Diagnosis Date  . Diabetes mellitus without complication ([place])   . Hypertension   . Prostate cancer ([place]) 09/05/2015  . Prostate cancer metastatic to bone ([place]) 09/05/2015  .  Sleep apnea   . Thyroid disease    Past Surgical History:  Procedure Laterality Date  . CATARACT EXTRACTION    . PORTACATH PLACEMENT Left 12/31/2015   Procedure: INSERTION PORT-A-CATH LEFT SUBCLAVIAN;  Surgeon: Aviva Signs, MD;  Location: [facility];  Service: General;  Laterality: Left;  . REPLACEMENT TOTAL KNEE Left      SOCIAL HISTORY:  Social History   Socioeconomic History  . Marital status: Married    Spouse name: Not on file  . Number of children: Not on file  . Years of education: Not on file  . Highest education level: Not on file  Occupational History  . Not on file  Social Needs  . Financial resource strain: Not on file  . Food insecurity:    Worry: Not on file    Inability: Not on file  . Transportation needs:    Medical: Not on file    Non-medical: Not on file  Tobacco Use  . Smoking status: Never Smoker  . Smokeless tobacco: Never Used  Substance and Sexual Activity  . Alcohol use: Yes    Comment: 1 beer each month  . Drug use: No  . Sexual activity: Never    Comment: married  Lifestyle  . Physical activity:    Days per week: Not on file    Minutes per session: Not on file  . Stress: Not on file  Relationships  . Social connections:    Talks on phone: Not on file    Gets together: Not on file    Attends religious service: Not on file    Active member of club or organization: Not on file    Attends meetings of clubs or organizations: Not on file    Relationship status: Not on file  . Intimate partner violence:    Fear of current or ex partner: Not on file    Emotionally abused: Not on file    Physically abused: Not on file    Forced sexual activity: Not on file  Other Topics Concern  . Not on file  Social History Narrative  . Not on file    FAMILY HISTORY:  Family History  Problem Relation Age of Onset  . Renal Disease Father   . Vascular Disease Father        Carotid artery disease  . Hypertension Brother     CURRENT MEDICATIONS:    Outpatient Encounter Medications as of 10/19/2017  Medication Sig Note  . atenolol (TENORMIN) 100 MG tablet Take 100 mg by mouth daily.   Marland Kitchen atorvastatin (LIPITOR) 20 MG tablet Take 10 mg by mouth every morning.    . calcium carbonate (OS-CAL - DOSED IN MG OF ELEMENTAL CALCIUM) 1250 (500  Ca) MG tablet Take 1 tablet by mouth 2 (two) times daily.    Marland Kitchen diltiazem (CARDIZEM CD) 300 MG 24 hr capsule Take 300 mg by mouth daily.   . DOCEtaxel (TAXOTERE IV) Inject into the vein every 21 ( twenty-one) days.    . feeding supplement, ENSURE ENLIVE, (ENSURE ENLIVE) LIQD Take 237 mLs by mouth 2 (two) times daily between meals.   . gabapentin (NEURONTIN) 300 MG capsule Patient may take 1 capsule in the morning and 2 capsules @ bedtime.   . Glucosamine-Chondroit-Vit C-Mn (GLUCOSAMINE 1500 COMPLEX PO) Take 1 tablet by mouth 2 (two) times daily.    Marland Kitchen KLOR-CON M20 20 MEQ tablet TAKE 1 TABLET BY MOUTH ONCE DAILY (Patient taking differently: TAKE 20 MEQ BY MOUTH ONCE DAILY)   . leuprolide (LUPRON) 7.5 MG injection Inject 7.5 mg into the muscle every 28 (twenty-eight) days.   Marland Kitchen levothyroxine (SYNTHROID, LEVOTHROID) 25 MCG tablet Take 25 mcg by mouth daily before breakfast.   . loperamide (IMODIUM) 1 MG/5ML solution Take 1 mg by mouth as needed for diarrhea or loose stools.   . Lysine 500 MG CAPS Take 500 mg by mouth daily. 10/06/2017: NOT CONSISTENT   . magic mouthwash w/lidocaine SOLN 1 part of each of the following: Benadryl 12.44m /537m Viscous lidocaine 2%, Maalox. Swish and swallow 5 mL QID. (Patient taking differently: Take 5 mLs by mouth 4 (four) times daily. 1 part of each of the following: Benadryl 12.23m40m23ml94miscous lidocaine 2%, Maalox. Swish and swallow 5 mL QID.)   . metFORMIN (GLUCOPHAGE) 500 MG tablet Take 500 mg by mouth 2 (two) times daily with a meal.   . morphine (MS CONTIN) 60 MG 12 hr tablet Take 1 tablet (60 mg total) by mouth every 12 (twelve) hours.   . moMarland Kitchenphine (MSIR) 30 MG tablet Take 1 tablet  (30 mg total) by mouth every 6 (six) hours as needed for severe pain.   . Multiple Vitamin (MULTIVITAMIN WITH MINERALS) TABS tablet Take 1 tablet by mouth daily.   . Omega-3 Fatty Acids (FISH OIL PO) Take 1 capsule by mouth daily.   . ondansetron (ZOFRAN-ODT) 8 MG disintegrating tablet Take 1 tablet (8 mg total) by mouth every 8 (eight) hours as needed for nausea or vomiting.   . predniSONE (DELTASONE) 5 MG tablet Take 5 mg by mouth daily.    . Rivaroxaban 15 & 20 MG TBPK Take as directed on package: Start with one 123mg63mlet by mouth twice a day with food. On Day 22, switch to one 20mg 81met once a day with food.   . tamsulosin (FLOMAX) 0.4 MG CAPS capsule Take 0.8 mg by mouth every morning.    . triamterene-hydrochlorothiazide (MAXZIDE-25) 37.5-25 MG tablet Take 1 tablet by mouth daily. for high blood pressure   . [DISCONTINUED] gabapentin (NEURONTIN) 300 MG capsule Patient may take 1 capsule in the morning and 2 capsules @ bedtime. (Patient taking differently: Take 300-600 mg by mouth See admin instructions. Patient may take 1 capsule in the morning and 2 capsules @ bedtime.)   . [DISCONTINUED] prochlorperazine (COMPAZINE) 10 MG tablet Take 1 tablet (10 mg total) by mouth every 6 (six) hours as needed (Nausea or vomiting). (Patient taking differently: Take 10 mg by mouth every 6 (six) hours as needed for nausea or vomiting. )    Facility-Administered Encounter Medications as of 10/19/2017  Medication  . leuprolide (LUPRON) injection 7.5 mg    ALLERGIES:  No Known Allergies   PHYSICAL EXAM:  ECOG Performance status: 1  Vitals:   10/19/17 0845  BP: (!) 144/69  Pulse: 69  Resp: 18  Temp: 98 F (36.7 C)  SpO2: 97%   Filed Weights   10/19/17 0845  Weight: 213 lb 10 oz (96.9 kg)    Physical Exam  Constitutional: He is oriented to person, place, and time. He appears well-developed and well-nourished.  Cardiovascular: Normal rate, regular rhythm and normal heart sounds.    Pulmonary/Chest: Effort normal and breath sounds normal.  Neurological: He is alert and oriented to person, place, and time.  Skin: Skin is warm and dry.     LABORATORY DATA:  I have reviewed the labs as listed.  CBC    Component Value Date/Time   WBC 5.8 10/19/2017 0826   RBC 3.26 (L) 10/19/2017 0826   HGB 9.6 (L) 10/19/2017 0826   HCT 31.2 (L) 10/19/2017 0826   PLT 319 10/19/2017 0826   MCV 95.7 10/19/2017 0826   MCH 29.4 10/19/2017 0826   MCHC 30.8 10/19/2017 0826   RDW 17.7 (H) 10/19/2017 0826   LYMPHSABS 1.0 10/19/2017 0826   MONOABS 0.5 10/19/2017 0826   EOSABS 0.1 10/19/2017 0826   BASOSABS 0.0 10/19/2017 0826   CMP Latest Ref Rng & Units 10/19/2017 10/06/2017 09/17/2017  Glucose 70 - 99 mg/dL 131(H) 172(H) 111(H)  BUN 8 - 23 mg/dL '17 15 15  ' Creatinine 0.61 - 1.24 mg/dL 0.81 0.83 0.76  Sodium 135 - 145 mmol/L 140 136 138  Potassium 3.5 - 5.1 mmol/L 4.2 4.1 3.8  Chloride 98 - 111 mmol/L 102 102 103  CO2 22 - 32 mmol/L '27 25 26  ' Calcium 8.9 - 10.3 mg/dL 9.2 8.7(L) 8.6(L)  Total Protein 6.5 - 8.1 g/dL 6.9 6.1(L) 6.3(L)  Total Bilirubin 0.3 - 1.2 mg/dL 0.6 0.7 0.7  Alkaline Phos 38 - 126 U/L 172(H) 136(H) 148(H)  AST 15 - 41 U/L 23 38 24  ALT 0 - 44 U/L '26 15 15       ' DIAGNOSTIC IMAGING:  I have reviewed the images of the CT scan of the abdomen and pelvis dated 10/06/2017 and discussed with the patient.     ASSESSMENT & PLAN:   Prostate cancer metastatic to bone ([place]) 1.  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 - BRCA 1/2 and other mutations negative ([facility]), PDL 1 expression of 0% -Foundation 1 testing shows MS-stable, TMB intermediate, KRAS G12D, AKT3 amplification, no other significant alterations to modify therapy. -Most recent progression on cabazitaxel.  He has used [place] in the past.  He had received 2 cycles of docetaxel in the beginning.  It is unclear whether this  was discontinued secondary to progression of disease.   -Cycle 1 of docetaxel at 20% dose reduction started on 06/25/2017.  He received cycle 2 on 07/16/2017.  Cycle 3 was on 08/06/2017. -We have reviewed the results of the CT scan and bone scan.  CT scan dated 08/25/2017 was compared to scans done on 05/06/2017.  There was addition of 2 new small lesions in the liver.  The dominant lesion appears bigger due to necrosis.  As there was 6 to 8 weeks delay in starting his first cycle of chemotherapy from the scans, these lesions could have grown during that time.  However his PSA has come down from 196 to161 at cycle 3.  He received cycle 4 on 08/27/2017.  Last treatment was cycle 5 on 09/17/2017. - We  discussed the results of the CT scan of the abdomen and pelvis dated 10/06/2017 which showed similar appearance of scattered liver metastatic lesions.  Although unchanged in size, dominant lesion in segment 4 of the liver does demonstrate some increased central necrosis compared to previous.  Diffuse skeletal metastasis are stable. - He may proceed with cycle 6 today without any dose modifications.  PSA level from today is pending.  Last PSA on 09/17/2017 was 184.  PSA prior to start of docetaxel was 196.  He will also receive Lupron injection today.  2.  Peripheral neuropathy: He has constant numbness in the feet which is stable.  This is from prior chemotherapy.  He takes gabapentin 1 tablet in the morning and 2 tablets at bedtime. This has not gotten worse after start of docetaxel.  3.  Chemotherapy-induced anemia: -He has normocytic anemia.  He received Feraheme second infusion on 09/17/2017.  Hemoglobin today improved to 9.6.  4.  Cancer related pain: -We have refilled his morphine ER 60 mg every 12 hours.  He has morphine IR 30 mg to be taken as needed.  He has not required any in the last 2 weeks.  5.  Incidental pulmonary embolism: - Diagnosed on a CT scan of the abdomen and pelvis on 10/06/2017 done for  follow-up of prostate cancer, showing right lower lobe pulmonary embolus.  CT evidence of right heart strain.  He was hospitalized, started on IV heparin, transition to Xarelto. -He is tolerating Xarelto very well.  6.  Bone metastasis: - He was receiving Xgeva.  He had tooth pulled 2 weeks ago.  Hence will hold his Xgeva at this time.  We may resume it in 3 weeks.      Orders placed this encounter:  Orders Placed This Encounter  Procedures  . PSA  . CBC with Differential/Platelet  . Comprehensive metabolic panel  . PSA      Derek Jack, MD [place] [phone]

## 2017-10-19 NOTE — Patient Instructions (Signed)
[place] at [facility]  Discharge Instructions:  Today you received your Taxotere infusion and Neulasta On-Pro pump. Follow up as scheduled. Call clinic for any questions or concerns. _______________________________________________________________  Thank you for choosing [place] at [facility] to provide your oncology and hematology care.  To afford each patient quality time with our providers, please arrive at least 15 minutes before your scheduled appointment.  You need to re-schedule your appointment if you arrive 10 or more minutes late.  We strive to give you quality time with our providers, and arriving late affects you and other patients whose appointments are after yours.  Also, if you no show three or more times for appointments you may be dismissed from the clinic.  Again, thank you for choosing [place] at [place] hope is that these requests will allow you access to exceptional care and in a timely manner. _______________________________________________________________  If you have questions after your visit, please contact our office at [phone] between the hours of 8:30 a.m. and 5:00 p.m. Voicemails left after 4:30 p.m. will not be returned until the following business day. _______________________________________________________________  For prescription refill requests, have your pharmacy contact our office. _______________________________________________________________  Recommendations made by the consultant and any test results will be sent to your referring physician. _______________________________________________________________

## 2017-10-19 NOTE — Patient Instructions (Signed)
[place] at [facility] Discharge Instructions  Follow up in 3 weeks with labs and treatment. You will see Dr. Walden Field at that time. You will follow up with [place] in 6 weeks with your next treatment and labs.    Thank you for choosing [place] at [facility] to provide your oncology and hematology care.  To afford each patient quality time with our provider, please arrive at least 15 minutes before your scheduled appointment time.   If you have a lab appointment with the [facility] please come in thru the  Main Entrance and check in at the main information desk  You need to re-schedule your appointment should you arrive 10 or more minutes late.  We strive to give you quality time with our providers, and arriving late affects you and other patients whose appointments are after yours.  Also, if you no show three or more times for appointments you may be dismissed from the clinic at the providers discretion.     Again, thank you for choosing [facility].  Our hope is that these requests will decrease the amount of time that you wait before being seen by our physicians.       _____________________________________________________________  Should you have questions after your visit to [facility], please contact our office at [phone] between the hours of 8:00 a.m. and 4:30 p.m.  Voicemails left after 4:00 p.m. will not be returned until the following business day.  For prescription refill requests, have your pharmacy contact our office and allow 72 hours.    Cancer Center Support Programs:   > Cancer Support Group  2nd Tuesday of the month 1pm-2pm, Journey Room

## 2017-10-19 NOTE — Progress Notes (Signed)
Micheal Davis tolerated Taxotere infusion well without incident or complaint. VSS upon completion of treatment. Neulasta On-Pro in place on right arm. Reinforced with tape after activation of needle and green light noted. Discharged self ambulatory in presence of wife.

## 2017-10-20 DIAGNOSIS — G620 Drug-induced polyneuropathy: Secondary | ICD-10-CM | POA: Diagnosis not present

## 2017-10-20 DIAGNOSIS — Z6830 Body mass index (BMI) 30.0-30.9, adult: Secondary | ICD-10-CM | POA: Diagnosis not present

## 2017-10-20 DIAGNOSIS — E1143 Type 2 diabetes mellitus with diabetic autonomic (poly)neuropathy: Secondary | ICD-10-CM | POA: Diagnosis not present

## 2017-10-20 DIAGNOSIS — I10 Essential (primary) hypertension: Secondary | ICD-10-CM | POA: Diagnosis not present

## 2017-10-20 DIAGNOSIS — E119 Type 2 diabetes mellitus without complications: Secondary | ICD-10-CM | POA: Diagnosis not present

## 2017-10-20 DIAGNOSIS — C61 Malignant neoplasm of prostate: Secondary | ICD-10-CM | POA: Diagnosis not present

## 2017-10-20 DIAGNOSIS — E039 Hypothyroidism, unspecified: Secondary | ICD-10-CM | POA: Diagnosis not present

## 2017-10-20 DIAGNOSIS — E782 Mixed hyperlipidemia: Secondary | ICD-10-CM | POA: Diagnosis not present

## 2017-10-23 DIAGNOSIS — I517 Cardiomegaly: Secondary | ICD-10-CM | POA: Diagnosis not present

## 2017-10-23 DIAGNOSIS — R0602 Shortness of breath: Secondary | ICD-10-CM | POA: Diagnosis not present

## 2017-10-23 DIAGNOSIS — I6523 Occlusion and stenosis of bilateral carotid arteries: Secondary | ICD-10-CM | POA: Diagnosis not present

## 2017-10-23 DIAGNOSIS — I071 Rheumatic tricuspid insufficiency: Secondary | ICD-10-CM | POA: Diagnosis not present

## 2017-10-23 DIAGNOSIS — R42 Dizziness and giddiness: Secondary | ICD-10-CM | POA: Diagnosis not present

## 2017-10-23 DIAGNOSIS — I340 Nonrheumatic mitral (valve) insufficiency: Secondary | ICD-10-CM | POA: Diagnosis not present

## 2017-10-23 DIAGNOSIS — C61 Malignant neoplasm of prostate: Secondary | ICD-10-CM | POA: Diagnosis not present

## 2017-10-30 ENCOUNTER — Other Ambulatory Visit (HOSPITAL_COMMUNITY): Payer: Self-pay | Admitting: *Deleted

## 2017-10-30 MED ORDER — RIVAROXABAN 20 MG PO TABS: 20.0000 mg | ORAL_TABLET | Freq: Every day | ORAL | 2 refills | Status: DC

## 2017-10-30 NOTE — Telephone Encounter (Signed)
Patient called clinic requesting refill on Xarelto. He is now taking the 20 mg tablet daily.  Chart reviewed and new Rx written and sent to his pharmacy.

## 2017-11-03 ENCOUNTER — Other Ambulatory Visit (HOSPITAL_COMMUNITY): Payer: Self-pay | Admitting: *Deleted

## 2017-11-03 DIAGNOSIS — C61 Malignant neoplasm of prostate: Secondary | ICD-10-CM

## 2017-11-03 DIAGNOSIS — C7951 Secondary malignant neoplasm of bone: Principal | ICD-10-CM

## 2017-11-03 MED ORDER — POTASSIUM CHLORIDE CRYS ER 20 MEQ PO TBCR: EXTENDED_RELEASE_TABLET | ORAL | 1 refills | Status: DC

## 2017-11-09 ENCOUNTER — Encounter (HOSPITAL_COMMUNITY): Payer: Self-pay | Admitting: Internal Medicine

## 2017-11-09 ENCOUNTER — Inpatient Hospital Stay (HOSPITAL_BASED_OUTPATIENT_CLINIC_OR_DEPARTMENT_OTHER): Payer: Medicare Other | Admitting: Internal Medicine

## 2017-11-09 ENCOUNTER — Inpatient Hospital Stay (HOSPITAL_COMMUNITY): Payer: Medicare Other

## 2017-11-09 ENCOUNTER — Inpatient Hospital Stay (HOSPITAL_COMMUNITY): Payer: Medicare Other | Attending: Hematology

## 2017-11-09 VITALS — BP 125/65 | HR 51 | Temp 97.8°F | Resp 18

## 2017-11-09 DIAGNOSIS — Z7984 Long term (current) use of oral hypoglycemic drugs: Secondary | ICD-10-CM

## 2017-11-09 DIAGNOSIS — C61 Malignant neoplasm of prostate: Secondary | ICD-10-CM

## 2017-11-09 DIAGNOSIS — M7989 Other specified soft tissue disorders: Secondary | ICD-10-CM | POA: Insufficient documentation

## 2017-11-09 DIAGNOSIS — E1140 Type 2 diabetes mellitus with diabetic neuropathy, unspecified: Secondary | ICD-10-CM

## 2017-11-09 DIAGNOSIS — D6481 Anemia due to antineoplastic chemotherapy: Secondary | ICD-10-CM

## 2017-11-09 DIAGNOSIS — I10 Essential (primary) hypertension: Secondary | ICD-10-CM | POA: Insufficient documentation

## 2017-11-09 DIAGNOSIS — C7951 Secondary malignant neoplasm of bone: Secondary | ICD-10-CM | POA: Insufficient documentation

## 2017-11-09 DIAGNOSIS — G893 Neoplasm related pain (acute) (chronic): Secondary | ICD-10-CM

## 2017-11-09 DIAGNOSIS — Z7901 Long term (current) use of anticoagulants: Secondary | ICD-10-CM | POA: Insufficient documentation

## 2017-11-09 DIAGNOSIS — E079 Disorder of thyroid, unspecified: Secondary | ICD-10-CM

## 2017-11-09 DIAGNOSIS — C787 Secondary malignant neoplasm of liver and intrahepatic bile duct: Secondary | ICD-10-CM

## 2017-11-09 DIAGNOSIS — Z79899 Other long term (current) drug therapy: Secondary | ICD-10-CM

## 2017-11-09 DIAGNOSIS — I2699 Other pulmonary embolism without acute cor pulmonale: Secondary | ICD-10-CM | POA: Insufficient documentation

## 2017-11-09 DIAGNOSIS — Z5111 Encounter for antineoplastic chemotherapy: Secondary | ICD-10-CM | POA: Insufficient documentation

## 2017-11-09 LAB — COMPREHENSIVE METABOLIC PANEL
ALBUMIN: 3.4 g/dL — AB (ref 3.5–5.0)
ALT: 18 U/L (ref 0–44)
ANION GAP: 10 (ref 5–15)
AST: 24 U/L (ref 15–41)
Alkaline Phosphatase: 172 U/L — ABNORMAL HIGH (ref 38–126)
BUN: 12 mg/dL (ref 8–23)
CALCIUM: 8.6 mg/dL — AB (ref 8.9–10.3)
CO2: 25 mmol/L (ref 22–32)
CREATININE: 0.71 mg/dL (ref 0.61–1.24)
Chloride: 104 mmol/L (ref 98–111)
GFR calc Af Amer: >60 mL/min (ref >60)
Glucose, Bld: 200 mg/dL — ABNORMAL HIGH (ref 70–99)
Potassium: 4.1 mmol/L (ref 3.5–5.1)
Sodium: 139 mmol/L (ref 135–145)
TOTAL PROTEIN: 6.5 g/dL (ref 6.5–8.1)
Total Bilirubin: 0.7 mg/dL (ref 0.3–1.2)

## 2017-11-09 LAB — CBC WITH DIFFERENTIAL/PLATELET
BASOS ABS: 0.1 10*3/uL (ref 0.0–0.1)
BASOS PCT: 1 %
EOS PCT: 0 %
Eosinophils Absolute: 0 10*3/uL (ref 0.0–0.7)
HEMATOCRIT: 29.2 % — AB (ref 39.0–52.0)
Hemoglobin: 9.1 g/dL — ABNORMAL LOW (ref 13.0–17.0)
Lymphocytes Relative: 15 %
Lymphs Abs: 1.2 10*3/uL (ref 0.7–4.0)
MCH: 30.1 pg (ref 26.0–34.0)
MCHC: 31.2 g/dL (ref 30.0–36.0)
MCV: 96.7 fL (ref 78.0–100.0)
MONO ABS: 0.5 10*3/uL (ref 0.1–1.0)
MONOS PCT: 6 %
NEUTROS ABS: 6.3 10*3/uL (ref 1.7–7.7)
Neutrophils Relative %: 78 %
PLATELETS: 168 10*3/uL (ref 150–400)
RBC: 3.02 MIL/uL — ABNORMAL LOW (ref 4.22–5.81)
RDW: 18.7 % — AB (ref 11.5–15.5)
WBC: 8 10*3/uL (ref 4.0–10.5)

## 2017-11-09 LAB — PSA: PROSTATIC SPECIFIC ANTIGEN: 181 ng/mL — AB (ref 0.00–4.00)

## 2017-11-09 MED ORDER — SODIUM CHLORIDE 0.9 % IV SOLN
Freq: Once | INTRAVENOUS | Status: DC
  Filled 2017-11-09: qty 4

## 2017-11-09 MED ORDER — DIPHENHYDRAMINE HCL 50 MG/ML IJ SOLN
25.0000 mg | Freq: Once | INTRAMUSCULAR | Status: AC
  Administered 2017-11-09: 25 mg via INTRAVENOUS
  Filled 2017-11-09: qty 1

## 2017-11-09 MED ORDER — SODIUM CHLORIDE 0.9 % IV SOLN
60.0000 mg/m2 | Freq: Once | INTRAVENOUS | Status: AC
  Administered 2017-11-09: 130 mg via INTRAVENOUS
  Filled 2017-11-09: qty 13

## 2017-11-09 MED ORDER — PEGFILGRASTIM 6 MG/0.6ML ~~LOC~~ PSKT
6.0000 mg | PREFILLED_SYRINGE | Freq: Once | SUBCUTANEOUS | Status: AC
  Administered 2017-11-09: 6 mg via SUBCUTANEOUS
  Filled 2017-11-09: qty 0.6

## 2017-11-09 MED ORDER — SODIUM CHLORIDE 0.9 % IV SOLN
Freq: Once | INTRAVENOUS | Status: AC
  Administered 2017-11-09: 10:00:00 via INTRAVENOUS

## 2017-11-09 MED ORDER — SODIUM CHLORIDE 0.9 % IV SOLN: 10.0000 mg | Freq: Once | INTRAVENOUS | Status: DC

## 2017-11-09 MED ORDER — MORPHINE SULFATE ER 60 MG PO TBCR: 60.0000 mg | EXTENDED_RELEASE_TABLET | Freq: Two times a day (BID) | ORAL | 0 refills | Status: DC

## 2017-11-09 MED ORDER — SODIUM CHLORIDE 0.9 % IV SOLN
Freq: Once | INTRAVENOUS | Status: AC
  Administered 2017-11-09: 10:00:00 via INTRAVENOUS
  Filled 2017-11-09: qty 4

## 2017-11-09 MED ORDER — HEPARIN SOD (PORK) LOCK FLUSH 100 UNIT/ML IV SOLN
500.0000 [IU] | Freq: Once | INTRAVENOUS | Status: AC | PRN
  Administered 2017-11-09: 500 [IU]
  Filled 2017-11-09: qty 5

## 2017-11-09 MED ORDER — LEUPROLIDE ACETATE (3 MONTH) 22.5 MG IM KIT
22.5000 mg | PACK | Freq: Once | INTRAMUSCULAR | Status: AC
  Administered 2017-11-09: 22.5 mg via INTRAMUSCULAR
  Filled 2017-11-09: qty 22.5

## 2017-11-09 NOTE — Progress Notes (Signed)
Diagnosis Prostate cancer metastatic to bone ([place]) - Plan: morphine (MS CONTIN) 60 MG 12 hr tablet  Staging Cancer Staging Prostate cancer metastatic to bone [facility]) Staging form: Prostate, AJCC 7th Edition - Clinical: No stage assigned - Unsigned   Assessment and Plan:  Prostate cancer metastatic to bone ([place]) 1.  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 - BRCA 1/2 negative, PDL 1 expression of 0% -Foundation 1 testing shows MS-stable, TMB intermediate, no other significant alterations to modify therapy. -Most recent progression on cabazitaxel.  He has used [place] in the past.  He had received 2 cycles of docetaxel in the beginning.  It is unclear whether this was discontinued secondary to progression of disease.   -Cycle 1 of docetaxel at 20% dose reduction started on 06/25/2017.  He received cycle 2 on 07/16/2017.  Cycle 3 was on 08/06/2017. -CT scan dated 08/25/2017 was compared to scans done on 05/06/2017.  There was addition of 2 new small lesions in the liver.  The dominant lesion appears bigger due to necrosis.    Pt is here for evaluation prior to C7 of Taxotere.  Labs done 11/09/2017 reviewed and showed WBC 8 HB 9.1 plts 168,000.  Chemistries WNL with Cr 0.71, normal LFTS. Recent PSA in 10/2017 was 191.  He will proceed with treatment today.    He will RTC in 3 weeks for follow-up with Dr. Worthy Keeler and labs prior to C8.  Pt planned for imaging after C8.    2.  Peripheral neuropathy: He has constant numbness in the feet which is stable.  This is from prior chemotherapy.  Continue Neurontin.  Will continue to monitor as therapy proceeds.   3.  Mouth pain.  He reports recent dental procedures.  Will hold Xgeva today.  Continue saline rinses and magic mouthwash.    4.  Chemotherapy-induced anemia.  Pt was last treated with IV iron in 08/2017.  HB on labs done 11/09/2017 was 9.1.  Will continue to monitor anemia as  therapy proceeds.    5.  Cancer related pain.  Pt requesting refill of Morphine ER 60 mg bid.  RX written.    30 minutes spent with more than 50% spent in counseling and coordination of care.     Interval History:  Historical data obtained from note dated 10/19/2017.  1.  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 - BRCA 1/2 negative, PDL 1 expression of 0% -Foundation 1 testing shows MS-stable, TMB intermediate, no other significant alterations to modify therapy. -Most recent progression on cabazitaxel.  He has used [place] in the past.  He had received 2 cycles of docetaxel in the beginning.  It is unclear whether this was discontinued secondary to progression of disease.   -Cycle 1 of docetaxel at 20% dose reduction started on 06/25/2017.  He received cycle 2 on 07/16/2017.  Cycle 3 was on 08/06/2017. -CT scan dated 08/25/2017 was compared to scans done on 05/06/2017.  There was addition of 2 new small lesions in the liver.  The dominant lesion appears bigger due to necrosis.    Current Status:  Pt seen today for follow-up prior to Taxotere.  He is complaining of mouth pain due to recent dental procedures.      Prostate cancer metastatic to bone ([place])   10/01/2012 Initial Diagnosis    Prostate biopsied with highest Gleason score of 9 seen and  the lowest score was 7.    10/04/2012 - 05/16/2013 Chemotherapy    Depo-Lupron and Casodex initiated    05/16/2013 -  Chemotherapy    Depo-Lupron monthly continued    05/16/2013 Progression    Progression by PSA elevation    05/16/2013 - 10/22/2014 Chemotherapy    Abiraterone and prednisone initiated in conjunction with ongoing Depo-Lupron.  Denosumab also ongoing.    10/23/2014 Progression    PSA increasing from 0.2- 1.6 in less than 6 months.      10/23/2014 - 01/30/2015 Chemotherapy    Enzalutamide and Prednisone (5 mg in AM and 2.5 mg in PM)    01/30/2015 Imaging     Bone scan- New focus of intense activity in right proximal humerus.  Interim increase in activity over left hip.    01/30/2015 Progression    Bone scan reveals new disease in right humerus consistent with progression of disease    01/31/2015 Imaging    Right humerus xray- blastic foci in proximal right humeral metaphysis and over right mid-humeral diaphysis.  No evidence of fracture    07/06/2015 Progression    Progression in multiple bones especially L hip and femurs    07/06/2015 Imaging    Bone scan- progressive multifocal osseous metastases in the right proximal femora and distal femoral shafts.  Stable update in bilateral ribs suspicious for small rib metastases    07/16/2015 - 07/31/2015 Radiation Therapy    Left femur 30 Gy in 10 fractions by Dr. Tammi Klippel    11/23/2015 - 01/04/2016 Chemotherapy    The patient had pegfilgrastim (NEULASTA ONPRO KIT) injection 6 mg, 6 mg, Subcutaneous, Once, 3 of 7 cycles  DOCEtaxel (TAXOTERE) 180 mg in dextrose 5 % 250 mL chemo infusion, 75 mg/m2 = 180 mg, Intravenous,  Once, 3 of 7 cycles Dose modification: 64 mg/m2 (original dose 75 mg/m2, Cycle 2, Reason: Dose not tolerated)  pegfilgrastim (NEULASTA ONPRO KIT) injection 6 mg, 6 mg, Subcutaneous, Once, 0 of 4 cycles  cabazitaxel (JEVTANA) 60 mg in dextrose 5 % 250 mL chemo infusion, 25 mg/m2, Intravenous,  Once, 0 of 4 cycles  for chemotherapy treatment.      11/30/2015 Adverse Reaction    Diarrhea (secondary to chemotherapy) and dehydration requiring IV fluids    12/14/2015 Treatment Plan Change    Docetaxel dose reduced by 15%    12/31/2015 Procedure    Port placed by Dr. Arnoldo Morale    01/28/2016 -  Chemotherapy    Cabazitaxel ([place])     03/27/2016 Imaging    CT Chest, Abdomen, and Pelvis with contrast 1. Diffuse sclerotic osseous metastatic disease in the chest, abdomen, and pelvis without acute fracture identified. 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  These appear chronic. 2. The prostate gland is normal in size and no adenopathy is currently identified. 3. On a prior MRI of 10/04/2012, there was a posterior right hepatic lobe lesion. This lesion is not currently visible on today' s CT. This could be due to differences in cons acuity between CT or MRI, or resolution of the lesion. 4. Coronary, aortic arch, and branch vessel atherosclerotic vascular disease. Aortoiliac atherosclerotic vascular disease. 5. Single bilateral renal cysts.    04/16/2016 Imaging    Bone scan- Findings consist with progressive metastatic disease. Activity over the proximal right humerus and proximal bilateral femurs are worrisome for the possible development of pathologic fractures.    08/21/2016 Imaging    CT C/A/P: IMPRESSION: No significant change since  03/27/2016 CT. Diffuse bony metastases without other evidence of metastatic disease.    08/21/2016 Imaging    Bone Scan: IMPRESSION: Multiple areas of increased activity again noted throughout the axial and appendicular skeleton in similar locations as prior exam. Intensity of uptake is increased from prior exam suggesting progressive disease. Lesions present in the proximal humeri, proximal femurs, and the mid right femur susceptible to pathologic fracture.    12/02/2016 Imaging    CT C/A/P: IMPRESSION: 1. Overall stable appearance of diffuse osseous metastatic disease and resulting patchy sclerosis. 2. The patient had a posterior right hepatic lobe lesion on prior MRI from 2014 which is been relatively occult on CT. Given the lack of progression I suspect that this is benign or has been effectively treated. 3. Aortic Atherosclerosis (ICD10-I70.0). Coronary atherosclerosis with mild cardiomegaly. 4. Cholelithiasis. 5. Bilateral benign renal cysts. 6. 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    12/02/2016 Imaging    Bone Scan: IMPRESSION: 1. Widespread osseous metastatic disease. Multiplicity is similar to previous exam with interval increase in degree of tracer uptake associated with multiple lesions.    03/11/2017 Imaging    Bone Scan: Multifocal skeletal disease without sign of progression CT C/A/P: Diffuse sclerotic skeletal lesions, unchanged from the previous.  No new lymphadenopathy, pulmonary nodules, or hepatic lesions to suggest soft tissue progressive disease    06/18/2017 -  Chemotherapy    The patient had pegfilgrastim (NEULASTA ONPRO KIT) injection 6 mg, 6 mg, Subcutaneous, Once, 7 of 8 cycles Administration: 6 mg (06/25/2017), 6 mg (07/16/2017), 6 mg (08/06/2017), 6 mg (08/27/2017), 6 mg (09/17/2017), 6 mg (10/19/2017), 6 mg (11/09/2017) DOCEtaxel (TAXOTERE) 130 mg in sodium chloride 0.9 % 250 mL chemo infusion, 60 mg/m2 = 130 mg (80 % of original dose 75 mg/m2), Intravenous,  Once, 7 of 8 cycles Dose modification: 60 mg/m2 (80 % of original dose 75 mg/m2, Cycle 1, Reason: Provider Judgment) Administration: 130 mg (06/25/2017), 130 mg (07/16/2017), 130 mg (08/06/2017), 130 mg (08/27/2017), 130 mg (09/17/2017), 130 mg (10/19/2017), 130 mg (11/09/2017) ondansetron (ZOFRAN) 8 mg in sodium chloride 0.9 % 50 mL IVPB, , Intravenous,  Once, 7 of 8 cycles Administration:  (11/09/2017)  for chemotherapy treatment.       Problem List Patient Active Problem List   Diagnosis Date Noted  . Subdural hematoma ([place]) [S06.5X9A] 10/07/2017  . Acute pulmonary embolism ([place]) [I26.99] 10/06/2017  . Sleep apnea [G47.30] 10/06/2017  . Hypertension [I10] 10/06/2017  . Thyroid disease [E07.9] 10/06/2017  . Diabetes mellitus without complication ([place]) [J88.3] 10/06/2017  . Gastroesophageal reflux disease [K21.9] 03/13/2016  . Prostate cancer metastatic to bone [facility]) [C61, C79.51] 09/05/2015  . Nausea [R11.0] 08/21/2015  .  SOB (shortness of breath) on exertion [R06.02] 08/21/2015  . 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 Osteoarthritis of right knee [M17.11] 01/16/2014    Past Medical History Past Medical History:  Diagnosis Date  . Diabetes mellitus without complication ([place])   . Hypertension   . Prostate cancer ([place]) 09/05/2015  . Prostate cancer metastatic to bone ([place]) 09/05/2015  . Sleep apnea   . Thyroid disease     Past Surgical History Past Surgical History:  Procedure Laterality Date  . CATARACT EXTRACTION    . PORTACATH PLACEMENT Left 12/31/2015   Procedure:  INSERTION PORT-A-CATH LEFT SUBCLAVIAN;  Surgeon: Aviva Signs, MD;  Location: [facility];  Service: General;  Laterality: Left;  . REPLACEMENT TOTAL KNEE Left     Family History Family History  Problem Relation Age of Onset  . Renal Disease Father   . Vascular Disease Father        Carotid artery disease  . Hypertension Brother      Social History  reports that he has never smoked. He has never used smokeless tobacco. He reports that he drinks alcohol. He reports that he does not use drugs.  Medications  Current Outpatient Medications:  .  atenolol (TENORMIN) 100 MG tablet, Take 100 mg by mouth daily., Disp: , Rfl:  .  atorvastatin (LIPITOR) 20 MG tablet, Take 10 mg by mouth every morning. , Disp: , Rfl:  .  calcium carbonate (OS-CAL - DOSED IN MG OF ELEMENTAL CALCIUM) 1250 (500 Ca) MG tablet, Take 1 tablet by mouth 2 (two) times daily. , Disp: , Rfl:  .  diltiazem (CARDIZEM CD) 300 MG 24 hr capsule, Take 300 mg by mouth daily., Disp: , Rfl:  .  DOCEtaxel (TAXOTERE IV), Inject into the vein every 21 ( twenty-one) days. , Disp: , Rfl:  .  gabapentin (NEURONTIN) 300 MG capsule, Patient may take 1 capsule in the morning and 2 capsules @ bedtime., Disp: 90 capsule, Rfl: 3 .  Glucosamine-Chondroit-Vit C-Mn (GLUCOSAMINE 1500 COMPLEX PO), Take 1 tablet by mouth  2 (two) times daily. , Disp: , Rfl:  .  leuprolide (LUPRON) 7.5 MG injection, Inject 7.5 mg into the muscle every 28 (twenty-eight) days., Disp: , Rfl:  .  levothyroxine (SYNTHROID, LEVOTHROID) 25 MCG tablet, Take 25 mcg by mouth daily before breakfast., Disp: , Rfl:  .  loperamide (IMODIUM) 1 MG/5ML solution, Take 1 mg by mouth as needed for diarrhea or loose stools., Disp: , Rfl:  .  Lysine 500 MG CAPS, Take 500 mg by mouth daily., Disp: , Rfl:  .  magic mouthwash w/lidocaine SOLN, 1 part of each of the following: Benadryl 12.56m /532m Viscous lidocaine 2%, Maalox. Swish and swallow 5 mL QID. (Patient taking differently: Take 5 mLs by mouth 4 (four) times daily. 1 part of each of the following: Benadryl 12.40m840m40ml61miscous lidocaine 2%, Maalox. Swish and swallow 5 mL QID.), Disp: 240 mL, Rfl: 1 .  metFORMIN (GLUCOPHAGE) 500 MG tablet, Take 500 mg by mouth 2 (two) times daily with a meal., Disp: , Rfl:  .  morphine (MS CONTIN) 60 MG 12 hr tablet, Take 1 tablet (60 mg total) by mouth every 12 (twelve) hours., Disp: 60 tablet, Rfl: 0 .  morphine (MSIR) 30 MG tablet, Take 1 tablet (30 mg total) by mouth every 6 (six) hours as needed for severe pain., Disp: 60 tablet, Rfl: 0 .  Multiple Vitamin (MULTIVITAMIN WITH MINERALS) TABS tablet, Take 1 tablet by mouth daily., Disp: , Rfl:  .  Omega-3 Fatty Acids (FISH OIL PO), Take 1 capsule by mouth daily., Disp: , Rfl:  .  ondansetron (ZOFRAN-ODT) 8 MG disintegrating tablet, Take 1 tablet (8 mg total) by mouth every 8 (eight) hours as needed for nausea or vomiting., Disp: 60 tablet, Rfl: 1 .  potassium chloride SA (KLOR-CON M20) 20 MEQ tablet, TAKE 20 MEQ BY MOUTH ONCE DAILY, Disp: 90 tablet, Rfl: 1 .  predniSONE (DELTASONE) 5 MG tablet, Take 5 mg by mouth daily. , Disp: , Rfl:  .  rivaroxaban (XARELTO) 20 MG TABS tablet, Take 1 tablet (  20 mg total) by mouth daily., Disp: 30 tablet, Rfl: 2 .  Rivaroxaban 15 & 20 MG TBPK, Take as directed on package: Start with  one 30m tablet by mouth twice a day with food. On Day 22, switch to one 271mtablet once a day with food., Disp: 51 each, Rfl: 0 .  tamsulosin (FLOMAX) 0.4 MG CAPS capsule, Take 0.8 mg by mouth every morning. , Disp: , Rfl:  .  triamterene-hydrochlorothiazide (MAXZIDE-25) 37.5-25 MG tablet, Take 1 tablet by mouth daily. for high blood pressure, Disp: , Rfl: 3 No current facility-administered medications for this visit.   Facility-Administered Medications Ordered in Other Visits:  .  leuprolide (LUPRON) injection 7.5 mg, 7.5 mg, Intramuscular, Q28 days, Kefalas, Thomas S, PA-C  Allergies Patient has no known allergies.  Review of Systems Review of Systems - Oncology ROS negative other than mouth pain   Physical Exam  Vitals Wt Readings from Last 3 Encounters:  11/09/17 217 lb 12.8 oz (98.8 kg)  10/19/17 213 lb 10 oz (96.9 kg)  10/09/17 216 lb 3.2 oz (98.1 kg)   Temp Readings from Last 3 Encounters:  11/09/17 97.8 F (36.6 C) (Oral)  11/09/17 97.9 F (36.6 C) (Oral)  10/19/17 98.1 F (36.7 C) (Oral)   BP Readings from Last 3 Encounters:  11/09/17 125/65  11/09/17 140/70  10/19/17 130/61   Pulse Readings from Last 3 Encounters:  11/09/17 (!) 51  11/09/17 62  10/19/17 (!) 57    Constitutional: Well-developed, well-nourished, and in no distress.   HENT: Head: Normocephalic and atraumatic.  Mouth/Throat: No oropharyngeal exudate. Mucosa moist. No thrush noted.   Eyes: Pupils are equal, round, and reactive to light. Conjunctivae are normal. No scleral icterus.  Neck: Normal range of motion. Neck supple. No JVD present.  Cardiovascular: Normal rate, regular rhythm and normal heart sounds.  Exam reveals no gallop and no friction rub.   No murmur heard. Pulmonary/Chest: Effort normal and breath sounds normal. No respiratory distress. No wheezes.No rales.  Abdominal: Soft. Bowel sounds are normal. No distension. There is no tenderness. There is no guarding.  Musculoskeletal:  No edema or tenderness.  Lymphadenopathy: No cervical, axillary or supraclavicular adenopathy.  Neurological: Alert and oriented to person, place, and time. No cranial nerve deficit.  Skin: Skin is warm and dry. No rash noted. No erythema. No pallor.  Psychiatric: Affect and judgment normal.   Labs Appointment on 11/09/2017  Component Date Value Ref Range Status  . WBC 11/09/2017 8.0  4.0 - 10.5 K/uL Final  . RBC 11/09/2017 3.02* 4.22 - 5.81 MIL/uL Final  . Hemoglobin 11/09/2017 9.1* 13.0 - 17.0 g/dL Final  . HCT 11/09/2017 29.2* 39.0 - 52.0 % Final  . MCV 11/09/2017 96.7  78.0 - 100.0 fL Final  . MCH 11/09/2017 30.1  26.0 - 34.0 pg Final  . MCHC 11/09/2017 31.2  30.0 - 36.0 g/dL Final  . RDW 11/09/2017 18.7* 11.5 - 15.5 % Final  . Platelets 11/09/2017 168  150 - 400 K/uL Final  . Neutrophils Relative % 11/09/2017 78  % Final  . Neutro Abs 11/09/2017 6.3  1.7 - 7.7 K/uL Final  . Lymphocytes Relative 11/09/2017 15  % Final  . Lymphs Abs 11/09/2017 1.2  0.7 - 4.0 K/uL Final  . Monocytes Relative 11/09/2017 6  % Final  . Monocytes Absolute 11/09/2017 0.5  0.1 - 1.0 K/uL Final  . Eosinophils Relative 11/09/2017 0  % Final  . Eosinophils Absolute 11/09/2017 0.0  0.0 - 0.7  K/uL Final  . Basophils Relative 11/09/2017 1  % Final  . Basophils Absolute 11/09/2017 0.1  0.0 - 0.1 K/uL Final   Performed at [facility], [address], [place], [place] [address]  . Sodium 11/09/2017 139  135 - 145 mmol/L Final  . Potassium 11/09/2017 4.1  3.5 - 5.1 mmol/L Final  . Chloride 11/09/2017 104  98 - 111 mmol/L Final  . CO2 11/09/2017 25  22 - 32 mmol/L Final  . Glucose, Bld 11/09/2017 200* 70 - 99 mg/dL Final  . BUN 11/09/2017 12  8 - 23 mg/dL Final  . Creatinine, Ser 11/09/2017 0.71  0.61 - 1.24 mg/dL Final  . Calcium 11/09/2017 8.6* 8.9 - 10.3 mg/dL Final  . Total Protein 11/09/2017 6.5  6.5 - 8.1 g/dL Final  . Albumin 11/09/2017 3.4* 3.5 - 5.0 g/dL Final  . AST 11/09/2017 24  15 - 41 U/L Final   . ALT 11/09/2017 18  0 - 44 U/L Final  . Alkaline Phosphatase 11/09/2017 172* 38 - 126 U/L Final  . Total Bilirubin 11/09/2017 0.7  0.3 - 1.2 mg/dL Final  . GFR calc non Af Amer 11/09/2017 >60  >60 mL/min Final  . GFR calc Af Amer 11/09/2017 >60  >60 mL/min Final   Comment: (NOTE) The eGFR has been calculated using the CKD EPI equation. This calculation has not been validated in all clinical situations. eGFR's persistently <60 mL/min signify possible Chronic Kidney Disease.   Georgiann Hahn gap 11/09/2017 10  5 - 15 Final   Performed at [facility], [address], [place], [place] [address]     Pathology No orders of the defined types were placed in this encounter.      Zoila Shutter MD

## 2017-11-09 NOTE — Patient Instructions (Signed)
[facility] at [facility]  Discharge Instructions:  You were seen by Dr. Higgs today _______________________________________________________________  Thank you for choosing [facility] at [facility] to provide your oncology and hematology care.  To afford each patient quality time with our providers, please arrive at least 15 minutes before your scheduled appointment.  You need to re-schedule your appointment if you arrive 10 or more minutes late.  We strive to give you quality time with our providers, and arriving late affects you and other patients whose appointments are after yours.  Also, if you no show three or more times for appointments you may be dismissed from the clinic.  Again, thank you for choosing [facility] at [facility]. Our hope is that these requests will allow you access to exceptional care and in a timely manner. _______________________________________________________________  If you have questions after your visit, please contact our office at [phone] between the hours of 8:30 a.m. and 5:00 p.m. Voicemails left after 4:30 p.m. will not be returned until the following business day. _______________________________________________________________  For prescription refill requests, have your pharmacy contact our office. _______________________________________________________________  Recommendations made by the consultant and any test results will be sent to your referring physician. _______________________________________________________________ 

## 2017-11-09 NOTE — Progress Notes (Signed)
Will hold Xgeva today per Dr. Walden Field d/t recent (5 weeks ago) dental surgery and pt c/o jaw pain still.  Will give first Lupron 22.5 mg dose today and continue every 3 months.   Tolerated infusion w/o adverse reaction.  Alert, in no distress.  [name] presents today for injection per the provider's orders.  Lupron administration without incident; see MAR for injection details. Neulasta OnPro placed RUE engaged with green light flashing. Patient tolerated procedures well and without incident.  No questions or complaints noted at this time.  Discharged ambulatory in c/o spouse.

## 2017-11-10 ENCOUNTER — Other Ambulatory Visit (HOSPITAL_COMMUNITY): Payer: Self-pay | Admitting: *Deleted

## 2017-11-10 DIAGNOSIS — C61 Malignant neoplasm of prostate: Secondary | ICD-10-CM

## 2017-11-10 DIAGNOSIS — C7951 Secondary malignant neoplasm of bone: Principal | ICD-10-CM

## 2017-11-10 MED ORDER — POTASSIUM CHLORIDE CRYS ER 20 MEQ PO TBCR: EXTENDED_RELEASE_TABLET | ORAL | 1 refills | Status: AC

## 2017-11-18 ENCOUNTER — Ambulatory Visit (HOSPITAL_COMMUNITY): Payer: Medicare Other

## 2017-11-18 ENCOUNTER — Other Ambulatory Visit (HOSPITAL_COMMUNITY): Payer: Medicare Other

## 2017-11-20 ENCOUNTER — Other Ambulatory Visit (HOSPITAL_COMMUNITY): Payer: Self-pay | Admitting: Internal Medicine

## 2017-11-23 ENCOUNTER — Other Ambulatory Visit: Payer: Self-pay

## 2017-11-23 ENCOUNTER — Inpatient Hospital Stay (HOSPITAL_COMMUNITY): Payer: Medicare Other

## 2017-11-23 VITALS — BP 154/70 | HR 74 | Temp 98.4°F | Resp 18

## 2017-11-23 DIAGNOSIS — C7951 Secondary malignant neoplasm of bone: Secondary | ICD-10-CM | POA: Diagnosis not present

## 2017-11-23 DIAGNOSIS — C787 Secondary malignant neoplasm of liver and intrahepatic bile duct: Secondary | ICD-10-CM | POA: Diagnosis not present

## 2017-11-23 DIAGNOSIS — C61 Malignant neoplasm of prostate: Secondary | ICD-10-CM

## 2017-11-23 DIAGNOSIS — G893 Neoplasm related pain (acute) (chronic): Secondary | ICD-10-CM | POA: Diagnosis not present

## 2017-11-23 DIAGNOSIS — Z5111 Encounter for antineoplastic chemotherapy: Secondary | ICD-10-CM | POA: Diagnosis not present

## 2017-11-23 DIAGNOSIS — D6481 Anemia due to antineoplastic chemotherapy: Secondary | ICD-10-CM | POA: Diagnosis not present

## 2017-11-23 LAB — COMPREHENSIVE METABOLIC PANEL
ALK PHOS: 153 U/L — AB (ref 38–126)
ALT: 19 U/L (ref 0–44)
AST: 25 U/L (ref 15–41)
Albumin: 3.4 g/dL — ABNORMAL LOW (ref 3.5–5.0)
Anion gap: 10 (ref 5–15)
BUN: 8 mg/dL (ref 8–23)
CALCIUM: 8.4 mg/dL — AB (ref 8.9–10.3)
CHLORIDE: 103 mmol/L (ref 98–111)
CO2: 27 mmol/L (ref 22–32)
CREATININE: 0.66 mg/dL (ref 0.61–1.24)
GFR calc Af Amer: >60 mL/min (ref >60)
Glucose, Bld: 138 mg/dL — ABNORMAL HIGH (ref 70–99)
Potassium: 3.2 mmol/L — ABNORMAL LOW (ref 3.5–5.1)
Sodium: 140 mmol/L (ref 135–145)
Total Bilirubin: 1 mg/dL (ref 0.3–1.2)
Total Protein: 6.3 g/dL — ABNORMAL LOW (ref 6.5–8.1)

## 2017-11-23 LAB — CBC WITH DIFFERENTIAL/PLATELET
BASOS PCT: 0 %
Basophils Absolute: 0 10*3/uL (ref 0.0–0.1)
EOS PCT: 1 %
Eosinophils Absolute: 0.1 10*3/uL (ref 0.0–0.7)
HEMATOCRIT: 28.9 % — AB (ref 39.0–52.0)
HEMOGLOBIN: 8.9 g/dL — AB (ref 13.0–17.0)
Lymphocytes Relative: 10 %
Lymphs Abs: 0.9 10*3/uL (ref 0.7–4.0)
MCH: 29.8 pg (ref 26.0–34.0)
MCHC: 30.8 g/dL (ref 30.0–36.0)
MCV: 96.7 fL (ref 78.0–100.0)
MONOS PCT: 6 %
Monocytes Absolute: 0.5 10*3/uL (ref 0.1–1.0)
NEUTROS ABS: 7 10*3/uL (ref 1.7–7.7)
NEUTROS PCT: 83 %
PLATELETS: 116 10*3/uL — AB (ref 150–400)
RBC: 2.99 MIL/uL — ABNORMAL LOW (ref 4.22–5.81)
RDW: 19.4 % — ABNORMAL HIGH (ref 11.5–15.5)
WBC: 8.5 10*3/uL (ref 4.0–10.5)

## 2017-11-23 MED ORDER — DENOSUMAB 120 MG/1.7ML ~~LOC~~ SOLN
120.0000 mg | Freq: Once | SUBCUTANEOUS | Status: AC
  Administered 2017-11-23: 120 mg via SUBCUTANEOUS
  Filled 2017-11-23: qty 1.7

## 2017-11-23 NOTE — Progress Notes (Signed)
xgeva given today. Calcium corrected to 8.8 per Herbert Moors Pharmacist.   Patient tolerated it well without problems. Vitals stable and discharged home from clinic ambulatory. Follow up as scheduled.

## 2017-11-23 NOTE — Patient Instructions (Signed)
[facility] at [facility] Discharge Instructions  xgeva given today  Follow up as scheduled.   Thank you for choosing [facility] at [facility] to provide your oncology and hematology care.  To afford each patient quality time with our provider, please arrive at least 15 minutes before your scheduled appointment time.   If you have a lab appointment with the Cancer Center please come in thru the  Main Entrance and check in at the main information desk  You need to re-schedule your appointment should you arrive 10 or more minutes late.  We strive to give you quality time with our providers, and arriving late affects you and other patients whose appointments are after yours.  Also, if you no show three or more times for appointments you may be dismissed from the clinic at the providers discretion.     Again, thank you for choosing [facility].  Our hope is that these requests will decrease the amount of time that you wait before being seen by our physicians.       _____________________________________________________________  Should you have questions after your visit to [facility], please contact our office at [phone] between the hours of 8:00 a.m. and 4:30 p.m.  Voicemails left after 4:00 p.m. will not be returned until the following business day.  For prescription refill requests, have your pharmacy contact our office and allow 72 hours.    Cancer Center Support Programs:   > Cancer Support Group  2nd Tuesday of the month 1pm-2pm, Journey Room   

## 2017-11-30 ENCOUNTER — Inpatient Hospital Stay (HOSPITAL_COMMUNITY): Payer: Medicare Other

## 2017-11-30 ENCOUNTER — Inpatient Hospital Stay (HOSPITAL_BASED_OUTPATIENT_CLINIC_OR_DEPARTMENT_OTHER): Payer: Medicare Other | Admitting: Hematology

## 2017-11-30 ENCOUNTER — Other Ambulatory Visit (HOSPITAL_COMMUNITY): Payer: Self-pay | Admitting: *Deleted

## 2017-11-30 ENCOUNTER — Encounter (HOSPITAL_COMMUNITY): Payer: Self-pay | Admitting: Hematology

## 2017-11-30 ENCOUNTER — Other Ambulatory Visit: Payer: Self-pay

## 2017-11-30 VITALS — BP 130/76 | HR 79 | Temp 98.7°F | Resp 18 | Wt 214.3 lb

## 2017-11-30 VITALS — BP 148/71 | HR 70 | Temp 98.0°F | Resp 18

## 2017-11-30 DIAGNOSIS — E1140 Type 2 diabetes mellitus with diabetic neuropathy, unspecified: Secondary | ICD-10-CM | POA: Diagnosis not present

## 2017-11-30 DIAGNOSIS — C7951 Secondary malignant neoplasm of bone: Secondary | ICD-10-CM | POA: Diagnosis not present

## 2017-11-30 DIAGNOSIS — I2699 Other pulmonary embolism without acute cor pulmonale: Secondary | ICD-10-CM

## 2017-11-30 DIAGNOSIS — C61 Malignant neoplasm of prostate: Secondary | ICD-10-CM

## 2017-11-30 DIAGNOSIS — D6481 Anemia due to antineoplastic chemotherapy: Secondary | ICD-10-CM

## 2017-11-30 DIAGNOSIS — G893 Neoplasm related pain (acute) (chronic): Secondary | ICD-10-CM | POA: Diagnosis not present

## 2017-11-30 DIAGNOSIS — I10 Essential (primary) hypertension: Secondary | ICD-10-CM

## 2017-11-30 DIAGNOSIS — G629 Polyneuropathy, unspecified: Secondary | ICD-10-CM

## 2017-11-30 DIAGNOSIS — R112 Nausea with vomiting, unspecified: Secondary | ICD-10-CM

## 2017-11-30 DIAGNOSIS — C787 Secondary malignant neoplasm of liver and intrahepatic bile duct: Secondary | ICD-10-CM | POA: Diagnosis not present

## 2017-11-30 DIAGNOSIS — Z5111 Encounter for antineoplastic chemotherapy: Secondary | ICD-10-CM | POA: Diagnosis not present

## 2017-11-30 LAB — COMPREHENSIVE METABOLIC PANEL
ALT: 19 U/L (ref 0–44)
AST: 33 U/L (ref 15–41)
Albumin: 3.5 g/dL (ref 3.5–5.0)
Alkaline Phosphatase: 199 U/L — ABNORMAL HIGH (ref 38–126)
Anion gap: 13 (ref 5–15)
BILIRUBIN TOTAL: 0.9 mg/dL (ref 0.3–1.2)
BUN: 12 mg/dL (ref 8–23)
CALCIUM: 8.5 mg/dL — AB (ref 8.9–10.3)
CO2: 24 mmol/L (ref 22–32)
CREATININE: 0.78 mg/dL (ref 0.61–1.24)
Chloride: 98 mmol/L (ref 98–111)
Glucose, Bld: 189 mg/dL — ABNORMAL HIGH (ref 70–99)
Potassium: 3.9 mmol/L (ref 3.5–5.1)
Sodium: 135 mmol/L (ref 135–145)
TOTAL PROTEIN: 7.1 g/dL (ref 6.5–8.1)

## 2017-11-30 LAB — CBC WITH DIFFERENTIAL/PLATELET
Basophils Absolute: 0.1 10*3/uL (ref 0.0–0.1)
Basophils Relative: 1 %
EOS PCT: 1 %
Eosinophils Absolute: 0 10*3/uL (ref 0.0–0.7)
HEMATOCRIT: 31.3 % — AB (ref 39.0–52.0)
HEMOGLOBIN: 9.5 g/dL — AB (ref 13.0–17.0)
LYMPHS ABS: 1 10*3/uL (ref 0.7–4.0)
LYMPHS PCT: 13 %
MCH: 29.3 pg (ref 26.0–34.0)
MCHC: 30.4 g/dL (ref 30.0–36.0)
MCV: 96.6 fL (ref 78.0–100.0)
Monocytes Absolute: 0.7 10*3/uL (ref 0.1–1.0)
Monocytes Relative: 9 %
Neutro Abs: 6 10*3/uL (ref 1.7–7.7)
Neutrophils Relative %: 76 %
PLATELETS: 148 10*3/uL — AB (ref 150–400)
RBC: 3.24 MIL/uL — AB (ref 4.22–5.81)
RDW: 19.2 % — ABNORMAL HIGH (ref 11.5–15.5)
WBC: 7.8 10*3/uL (ref 4.0–10.5)

## 2017-11-30 MED ORDER — DIPHENHYDRAMINE HCL 50 MG/ML IJ SOLN
25.0000 mg | Freq: Once | INTRAMUSCULAR | Status: AC
  Administered 2017-11-30: 25 mg via INTRAVENOUS
  Filled 2017-11-30: qty 1

## 2017-11-30 MED ORDER — MORPHINE SULFATE ER 60 MG PO TBCR: 60.0000 mg | EXTENDED_RELEASE_TABLET | Freq: Two times a day (BID) | ORAL | 0 refills | Status: DC

## 2017-11-30 MED ORDER — HEPARIN SOD (PORK) LOCK FLUSH 100 UNIT/ML IV SOLN
500.0000 [IU] | Freq: Once | INTRAVENOUS | Status: AC | PRN
  Administered 2017-11-30: 500 [IU]
  Filled 2017-11-30: qty 5

## 2017-11-30 MED ORDER — SODIUM CHLORIDE 0.9 % IV SOLN
Freq: Once | INTRAVENOUS | Status: AC
  Administered 2017-11-30: 10:00:00 via INTRAVENOUS
  Filled 2017-11-30: qty 4

## 2017-11-30 MED ORDER — PEGFILGRASTIM 6 MG/0.6ML ~~LOC~~ PSKT
6.0000 mg | PREFILLED_SYRINGE | Freq: Once | SUBCUTANEOUS | Status: AC
  Administered 2017-11-30: 6 mg via SUBCUTANEOUS
  Filled 2017-11-30: qty 0.6

## 2017-11-30 MED ORDER — ONDANSETRON 8 MG PO TBDP: 8.0000 mg | ORAL_TABLET | Freq: Three times a day (TID) | ORAL | 1 refills | Status: AC | PRN

## 2017-11-30 MED ORDER — SODIUM CHLORIDE 0.9 % IV SOLN
60.0000 mg/m2 | Freq: Once | INTRAVENOUS | Status: AC
  Administered 2017-11-30: 130 mg via INTRAVENOUS
  Filled 2017-11-30: qty 13

## 2017-11-30 MED ORDER — SODIUM CHLORIDE 0.9 % IV SOLN
Freq: Once | INTRAVENOUS | Status: AC
  Administered 2017-11-30: 10:00:00 via INTRAVENOUS

## 2017-11-30 MED ORDER — SODIUM CHLORIDE 0.9% FLUSH
10.0000 mL | INTRAVENOUS | Status: DC | PRN
  Administered 2017-11-30: 10 mL
  Filled 2017-11-30: qty 10

## 2017-11-30 NOTE — Progress Notes (Signed)
[place] [address] [place], [place] [address]   CLINIC:  Medical Oncology/Hematology  PCP:  [name], MD [place] [address] [phone]   REASON FOR VISIT: Follow-up for prostate cancer to the bones  CURRENT THERAPY: Docetaxel every 3 weeks  BRIEF ONCOLOGIC HISTORY:    Prostate cancer metastatic to bone ([place])   10/01/2012 Initial Diagnosis    Prostate biopsied with highest Gleason score of 9 seen and the lowest score was 7.    10/04/2012 - 05/16/2013 Chemotherapy    Depo-Lupron and Casodex initiated    05/16/2013 -  Chemotherapy    Depo-Lupron monthly continued    05/16/2013 Progression    Progression by PSA elevation    05/16/2013 - 10/22/2014 Chemotherapy    Abiraterone and prednisone initiated in conjunction with ongoing Depo-Lupron.  Denosumab also ongoing.    10/23/2014 Progression    PSA increasing from 0.2- 1.6 in less than 6 months.      10/23/2014 - 01/30/2015 Chemotherapy    Enzalutamide and Prednisone (5 mg in AM and 2.5 mg in PM)    01/30/2015 Imaging    Bone scan- New focus of intense activity in right proximal humerus.  Interim increase in activity over left hip.    01/30/2015 Progression    Bone scan reveals new disease in right humerus consistent with progression of disease    01/31/2015 Imaging    Right humerus xray- blastic foci in proximal right humeral metaphysis and over right mid-humeral diaphysis.  No evidence of fracture    07/06/2015 Progression    Progression in multiple bones especially L hip and femurs    07/06/2015 Imaging    Bone scan- progressive multifocal osseous metastases in the right proximal femora and distal femoral shafts.  Stable update in bilateral ribs suspicious for small rib metastases    07/16/2015 - 07/31/2015 Radiation Therapy    Left femur 30 Gy in 10 fractions by Dr. Tammi Klippel    11/23/2015 - 01/04/2016 Chemotherapy    The patient had pegfilgrastim (NEULASTA ONPRO KIT) injection 6 mg, 6  mg, Subcutaneous, Once, 3 of 7 cycles  DOCEtaxel (TAXOTERE) 180 mg in dextrose 5 % 250 mL chemo infusion, 75 mg/m2 = 180 mg, Intravenous,  Once, 3 of 7 cycles Dose modification: 64 mg/m2 (original dose 75 mg/m2, Cycle 2, Reason: Dose not tolerated)  pegfilgrastim (NEULASTA ONPRO KIT) injection 6 mg, 6 mg, Subcutaneous, Once, 0 of 4 cycles  cabazitaxel (JEVTANA) 60 mg in dextrose 5 % 250 mL chemo infusion, 25 mg/m2, Intravenous,  Once, 0 of 4 cycles  for chemotherapy treatment.      11/30/2015 Adverse Reaction    Diarrhea (secondary to chemotherapy) and dehydration requiring IV fluids    12/14/2015 Treatment Plan Change    Docetaxel dose reduced by 15%    12/31/2015 Procedure    Port placed by Dr. Arnoldo Morale    01/28/2016 -  Chemotherapy    Cabazitaxel (Jevtana)     03/27/2016 Imaging    CT Chest, Abdomen, and Pelvis with contrast 1. Diffuse sclerotic osseous metastatic disease in the chest, abdomen, and pelvis without acute fracture identified. 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 These appear chronic. 2. The prostate gland is normal in size and no adenopathy is currently identified. 3. On a prior MRI of 10/04/2012, there was a posterior right hepatic lobe lesion. This lesion is not currently visible on today' s CT. This could be due to  differences in cons acuity between CT or MRI, or resolution of the lesion. 4. Coronary, aortic arch, and branch vessel atherosclerotic vascular disease. Aortoiliac atherosclerotic vascular disease. 5. Single bilateral renal cysts.    04/16/2016 Imaging    Bone scan- Findings consist with progressive metastatic disease. Activity over the proximal right humerus and proximal bilateral femurs are worrisome for the possible development of pathologic fractures.    08/21/2016 Imaging    CT C/A/P: IMPRESSION: No significant change since 03/27/2016 CT. Diffuse bony metastases without other evidence of metastatic  disease.    08/21/2016 Imaging    Bone Scan: IMPRESSION: Multiple areas of increased activity again noted throughout the axial and appendicular skeleton in similar locations as prior exam. Intensity of uptake is increased from prior exam suggesting progressive disease. Lesions present in the proximal humeri, proximal femurs, and the mid right femur susceptible to pathologic fracture.    12/02/2016 Imaging    CT C/A/P: IMPRESSION: 1. Overall stable appearance of diffuse osseous metastatic disease and resulting patchy sclerosis. 2. The patient had a posterior right hepatic lobe lesion on prior MRI from 2014 which is been relatively occult on CT. Given the lack of progression I suspect that this is benign or has been effectively treated. 3. Aortic Atherosclerosis (ICD10-I70.0). Coronary atherosclerosis with mild cardiomegaly. 4. Cholelithiasis. 5. Bilateral benign renal cysts. 6. 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    12/02/2016 Imaging    Bone Scan: IMPRESSION: 1. Widespread osseous metastatic disease. Multiplicity is similar to previous exam with interval increase in degree of tracer uptake associated with multiple lesions.    03/11/2017 Imaging    Bone Scan: Multifocal skeletal disease without sign of progression CT C/A/P: Diffuse sclerotic skeletal lesions, unchanged from the previous.  No new lymphadenopathy, pulmonary nodules, or hepatic lesions to suggest soft tissue progressive disease    06/18/2017 -  Chemotherapy    The patient had pegfilgrastim (NEULASTA ONPRO KIT) injection 6 mg, 6 mg, Subcutaneous, Once, 7 of 8 cycles Administration: 6 mg (06/25/2017), 6 mg (07/16/2017), 6 mg (08/06/2017), 6 mg (08/27/2017), 6 mg (09/17/2017), 6 mg (10/19/2017), 6 mg (11/09/2017) DOCEtaxel (TAXOTERE) 130 mg in sodium  chloride 0.9 % 250 mL chemo infusion, 60 mg/m2 = 130 mg (80 % of original dose 75 mg/m2), Intravenous,  Once, 7 of 8 cycles Dose modification: 60 mg/m2 (80 % of original dose 75 mg/m2, Cycle 1, Reason: Provider Judgment) Administration: 130 mg (06/25/2017), 130 mg (07/16/2017), 130 mg (08/06/2017), 130 mg (08/27/2017), 130 mg (09/17/2017), 130 mg (10/19/2017), 130 mg (11/09/2017) ondansetron (ZOFRAN) 8 mg in sodium chloride 0.9 % 50 mL IVPB, , Intravenous,  Once, 7 of 8 cycles Administration:  (11/09/2017)  for chemotherapy treatment.       CANCER STAGING: Cancer Staging Prostate cancer metastatic to bone [facility]) Staging form: Prostate, AJCC 7th Edition - Clinical: No stage assigned - Unsigned    INTERVAL HISTORY:  Micheal Davis 72 y.o. male returns for routine follow-up for prostate cancer to the bones. Patient is here today with his wife. They just returned from a 8 day cruise. He walked the whole time without his cane and ate well while on vacation. His ankles were swollen when he returned. He is having trouble with his gums bleeding. He is seeing a specialist for this. The gum bleeding started when he began his blood thinner. He is  taking pain medication for his pain in his legs and back. He denies any nausea, vomiting, or diarrhea. Denies any skin rashes. Denies any fevers or recent infections. His appetite and energy level is 50%. He is maintaining his weight at this time.    REVIEW OF SYSTEMS:  Review of Systems  Constitutional: Positive for chills and fatigue.       Bleeding gums  HENT:   Positive for mouth sores.   Cardiovascular: Positive for leg swelling.  Gastrointestinal: Positive for constipation.  Neurological: Positive for dizziness, extremity weakness and numbness.  Hematological: Bruises/bleeds easily.  All other systems reviewed and are negative.    PAST MEDICAL/SURGICAL HISTORY:  Past Medical History:  Diagnosis Date  . Diabetes mellitus without complication ([place])   .  Hypertension   . Prostate cancer ([place]) 09/05/2015  . Prostate cancer metastatic to bone ([place]) 09/05/2015  . Sleep apnea   . Thyroid disease    Past Surgical History:  Procedure Laterality Date  . CATARACT EXTRACTION    . PORTACATH PLACEMENT Left 12/31/2015   Procedure: INSERTION PORT-A-CATH LEFT SUBCLAVIAN;  Surgeon: Aviva Signs, MD;  Location: [facility];  Service: General;  Laterality: Left;  . REPLACEMENT TOTAL KNEE Left      SOCIAL HISTORY:  Social History   Socioeconomic History  . Marital status: Married    Spouse name: Not on file  . Number of children: Not on file  . Years of education: Not on file  . Highest education level: Not on file  Occupational History  . Not on file  Social Needs  . Financial resource strain: Not on file  . Food insecurity:    Worry: Not on file    Inability: Not on file  . Transportation needs:    Medical: Not on file    Non-medical: Not on file  Tobacco Use  . Smoking status: Never Smoker  . Smokeless tobacco: Never Used  Substance and Sexual Activity  . Alcohol use: Yes    Comment: 1 beer each month  . Drug use: No  . Sexual activity: Never    Comment: married  Lifestyle  . Physical activity:    Days per week: Not on file    Minutes per session: Not on file  . Stress: Not on file  Relationships  . Social connections:    Talks on phone: Not on file    Gets together: Not on file    Attends religious service: Not on file    Active member of club or organization: Not on file    Attends meetings of clubs or organizations: Not on file    Relationship status: Not on file  . Intimate partner violence:    Fear of current or ex partner: Not on file    Emotionally abused: Not on file    Physically abused: Not on file    Forced sexual activity: Not on file  Other Topics Concern  . Not on file  Social History Narrative  . Not on file    FAMILY HISTORY:  Family History  Problem Relation Age of Onset  . Renal Disease Father   .  Vascular Disease Father        Carotid artery disease  . Hypertension Brother     CURRENT MEDICATIONS:  Outpatient Encounter Medications as of 11/30/2017  Medication Sig Note  . atenolol (TENORMIN) 100 MG tablet Take 100 mg by mouth daily.   Marland Kitchen atorvastatin (LIPITOR) 20 MG tablet Take 10 mg by mouth  every morning.    . calcium carbonate (OS-CAL - DOSED IN MG OF ELEMENTAL CALCIUM) 1250 (500 Ca) MG tablet Take 1 tablet by mouth 2 (two) times daily.    Marland Kitchen diltiazem (CARDIZEM CD) 300 MG 24 hr capsule Take 300 mg by mouth daily.   . DOCEtaxel (TAXOTERE IV) Inject into the vein every 21 ( twenty-one) days.    Marland Kitchen gabapentin (NEURONTIN) 300 MG capsule Patient may take 1 capsule in the morning and 2 capsules @ bedtime.   . Glucosamine-Chondroit-Vit C-Mn (GLUCOSAMINE 1500 COMPLEX PO) Take 1 tablet by mouth 2 (two) times daily.    Marland Kitchen leuprolide (LUPRON) 7.5 MG injection Inject 7.5 mg into the muscle every 28 (twenty-eight) days.   Marland Kitchen levothyroxine (SYNTHROID, LEVOTHROID) 25 MCG tablet Take 25 mcg by mouth daily before breakfast.   . loperamide (IMODIUM) 1 MG/5ML solution Take 1 mg by mouth as needed for diarrhea or loose stools.   . Lysine 500 MG CAPS Take 500 mg by mouth daily. 10/06/2017: NOT CONSISTENT   . magic mouthwash w/lidocaine SOLN 1 part of each of the following: Benadryl 12.'5mg'$  /70m, Viscous lidocaine 2%, Maalox. Swish and swallow 5 mL QID. (Patient taking differently: Take 5 mLs by mouth 4 (four) times daily. 1 part of each of the following: Benadryl 12.'5mg'$  /563m Viscous lidocaine 2%, Maalox. Swish and swallow 5 mL QID.)   . metFORMIN (GLUCOPHAGE) 500 MG tablet Take 500 mg by mouth 2 (two) times daily with a meal.   . morphine (MS CONTIN) 60 MG 12 hr tablet Take 1 tablet (60 mg total) by mouth every 12 (twelve) hours.   . Marland Kitchenorphine (MSIR) 30 MG tablet Take 1 tablet (30 mg total) by mouth every 6 (six) hours as needed for severe pain.   . Multiple Vitamin (MULTIVITAMIN WITH MINERALS) TABS tablet Take  1 tablet by mouth daily.   . Omega-3 Fatty Acids (FISH OIL PO) Take 1 capsule by mouth daily.   . ondansetron (ZOFRAN-ODT) 8 MG disintegrating tablet Take 1 tablet (8 mg total) by mouth every 8 (eight) hours as needed for nausea or vomiting.   . potassium chloride SA (KLOR-CON M20) 20 MEQ tablet TAKE 20 MEQ BY MOUTH ONCE DAILY   . predniSONE (DELTASONE) 5 MG tablet Take 5 mg by mouth daily.    . rivaroxaban (XARELTO) 20 MG TABS tablet Take 1 tablet (20 mg total) by mouth daily.   . Rivaroxaban 15 & 20 MG TBPK Take as directed on package: Start with one '15mg'$  tablet by mouth twice a day with food. On Day 22, switch to one '20mg'$  tablet once a day with food.   . tamsulosin (FLOMAX) 0.4 MG CAPS capsule Take 0.8 mg by mouth every morning.    . triamterene-hydrochlorothiazide (MAXZIDE-25) 37.5-25 MG tablet Take 1 tablet by mouth daily. for high blood pressure   . [DISCONTINUED] prochlorperazine (COMPAZINE) 10 MG tablet Take 1 tablet (10 mg total) by mouth every 6 (six) hours as needed (Nausea or vomiting). (Patient taking differently: Take 10 mg by mouth every 6 (six) hours as needed for nausea or vomiting. )    Facility-Administered Encounter Medications as of 11/30/2017  Medication  . leuprolide (LUPRON) injection 7.5 mg    ALLERGIES:  No Known Allergies   PHYSICAL EXAM:  ECOG Performance status: 1  Vitals:   11/30/17 0919  BP: 130/76  Pulse: 79  Resp: 18  Temp: 98.7 F (37.1 C)  SpO2: 100%   Filed Weights   11/30/17 0919  Weight:  214 lb 4.8 oz (97.2 kg)    Physical Exam  Constitutional: He is oriented to person, place, and time. He appears well-developed and well-nourished.  Cardiovascular: Normal rate, regular rhythm and normal heart sounds.  Pulmonary/Chest: Effort normal and breath sounds normal.  Musculoskeletal: Normal range of motion.  Neurological: He is alert and oriented to person, place, and time.  Skin: Skin is warm and dry.  Psychiatric: He has a normal mood and  affect. His behavior is normal. Judgment and thought content normal.  Extremity's: 1+ edema.   LABORATORY DATA:  I have reviewed the labs as listed.  CBC    Component Value Date/Time   WBC 7.8 11/30/2017 0831   RBC 3.24 (L) 11/30/2017 0831   HGB 9.5 (L) 11/30/2017 0831   HCT 31.3 (L) 11/30/2017 0831   PLT 148 (L) 11/30/2017 0831   MCV 96.6 11/30/2017 0831   MCH 29.3 11/30/2017 0831   MCHC 30.4 11/30/2017 0831   RDW 19.2 (H) 11/30/2017 0831   LYMPHSABS 1.0 11/30/2017 0831   MONOABS 0.7 11/30/2017 0831   EOSABS 0.0 11/30/2017 0831   BASOSABS 0.1 11/30/2017 0831   CMP Latest Ref Rng & Units 11/30/2017 11/23/2017 11/09/2017  Glucose 70 - 99 mg/dL 189(H) 138(H) 200(H)  BUN 8 - 23 mg/dL _0 Creatinine 0.61 - 1.24 mg/dL 0.78 0.66 0.71  Sodium 135 - 145 mmol/L 135 140 139  Potassium 3.5 - 5.1 mmol/L 3.9 3.2(L) 4.1  Chloride 98 - 111 mmol/L 98 103 104  CO2 22 - 32 mmol/L _1 Calcium 8.9 - 10.3 mg/dL 8.5(L) 8.4(L) 8.6(L)  Total Protein 6.5 - 8.1 g/dL 7.1 6.3(L) 6.5  Total Bilirubin 0.3 - 1.2 mg/dL 0.9 1.0 0.7  Alkaline Phos 38 - 126 U/L 199(H) 153(H) 172(H)  AST 15 - 41 U/L 33 25 24  ALT 0 - 44 U/L _2 ASSESSMENT & PLAN:   Prostate cancer metastatic to bone ([place]) 1.  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 - BRCA 1/2 and other mutations negative ([facility]riad), PDL 1 expression of 0% -Foundation 1 testing shows MS-stable, TMB intermediate, KRAS G12D, AKT3 amplification, no other significant alterations to modify therapy. -Most recent progression on cabazitaxel.  He has used [place] in the past.  He had received 2 cycles of docetaxel in the beginning.  It is unclear whether this was discontinued secondary to progression of disease.   -Cycle 1 of docetaxel at 20% dose reduction started on 06/25/2017.  He received cycle 2 on 07/16/2017.  Cycle 3 was on 08/06/2017. -We have reviewed the  results of the CT scan and bone scan.  CT scan dated 08/25/2017 was compared to scans done on 05/06/2017.  There was addition of 2 new small lesions in the liver.  The dominant lesion appears bigger due to necrosis.  As there was 6 to 8 weeks delay in starting his first cycle of chemotherapy from the scans, these lesions could have grown during that time.  However his PSA has come down from 196 to161 at cycle 3.  He received cycle 4 on 08/27/2017.  Last treatment was cycle 5 on 09/17/2017. - We discussed the results of the CT scan of the abdomen and pelvis dated 10/06/2017 which showed similar appearance of scattered liver metastatic lesions.  Although unchanged in size, dominant lesion in segment 4 of the liver does demonstrate some increased  central necrosis compared to previous.  Diffuse skeletal metastasis are stable. - Cycle 7 chemotherapy was on 11/09/2017.  His PSA has come down by 10 points.  He went on a cruise for 1 week and managed well.  His tingling and numbness in the feet has been stable. - I have reviewed his blood work.  He may proceed with cycle 8 without any dose modifications.  I plan to repeat CT scan of the abdomen and pelvis and bone scan prior to next visit in 3 weeks.  If they continue to show response, I will cut back on the dose of docetaxel to 50 mg/m.  2.  Peripheral neuropathy: He has constant numbness in the feet even prior to start of chemotherapy.  He takes gabapentin 1 tablet in the morning and 2 tablets at bedtime.  The numbness has not gotten any worse.  He has occasional numbness in the left hand.  This is new.  3.  Chemotherapy-induced anemia: -He received Feraheme second infusion on 09/17/2017.  Hemoglobin today is 9.5.  4.  Cancer related pain: -He takes morphine ER 60 mg every 12 hours.  He is not requiring morphine IR 30 mg on a regular basis.  He takes it as needed.  Pain is fairly well controlled.  Mainly in the lower extremities and back.  5.  Incidental pulmonary  embolism: - Diagnosed on a CT scan of the abdomen and pelvis on 10/06/2017 done for follow-up of prostate cancer, showing right lower lobe pulmonary embolus.  CT evidence of right heart strain.  He was hospitalized, started on IV heparin, transition to Xarelto. -He is tolerating Xarelto very well.  He has occasional gum bleeding due to poor dentition.  Happens mainly when he tries to eat.   6.  Bone metastasis: - He will continue denosumab every 4 weeks.       Orders placed this encounter:  Orders Placed This Encounter  Procedures  . NM Bone Scan Whole Body  . CT Chest W Contrast  . CT Abdomen Pelvis W Contrast  . CBC with Differential/Platelet  . Comprehensive metabolic panel  . PSA      Derek Jack, MD [place] [phone]

## 2017-11-30 NOTE — Patient Instructions (Signed)
[place] at [facility] Discharge Instructions  Follow up in 3 weeks with labs bone scan and CT scan prior to your visit.    Thank you for choosing [place] at [facility] to provide your oncology and hematology care.  To afford each patient quality time with our provider, please arrive at least 15 minutes before your scheduled appointment time.   If you have a lab appointment with the [facility] please come in thru the  Main Entrance and check in at the main information desk  You need to re-schedule your appointment should you arrive 10 or more minutes late.  We strive to give you quality time with our providers, and arriving late affects you and other patients whose appointments are after yours.  Also, if you no show three or more times for appointments you may be dismissed from the clinic at the providers discretion.     Again, thank you for choosing [facility].  Our hope is that these requests will decrease the amount of time that you wait before being seen by our physicians.       _____________________________________________________________  Should you have questions after your visit to [facility], please contact our office at [phone] between the hours of 8:00 a.m. and 4:30 p.m.  Voicemails left after 4:00 p.m. will not be returned until the following business day.  For prescription refill requests, have your pharmacy contact our office and allow 72 hours.    Cancer Center Support Programs:   > Cancer Support Group  2nd Tuesday of the month 1pm-2pm, Journey Room

## 2017-11-30 NOTE — Telephone Encounter (Signed)
Patient called requesting refill on Morphine, states that he knows it is early, but he didn't want to run out.

## 2017-11-30 NOTE — Assessment & Plan Note (Signed)
1.  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 - BRCA 1/2 and other mutations negative (Myriad), PDL 1 expression of 0% -Foundation 1 testing shows MS-stable, TMB intermediate, KRAS G12D, AKT3 amplification, no other significant alterations to modify therapy. -Most recent progression on cabazitaxel.  He has used Xtandi and Zytiga in the past.  He had received 2 cycles of docetaxel in the beginning.  It is unclear whether this was discontinued secondary to progression of disease.   -Cycle 1 of docetaxel at 20% dose reduction started on 06/25/2017.  He received cycle 2 on 07/16/2017.  Cycle 3 was on 08/06/2017. -We have reviewed the results of the CT scan and bone scan.  CT scan dated 08/25/2017 was compared to scans done on 05/06/2017.  There was addition of 2 new small lesions in the liver.  The dominant lesion appears bigger due to necrosis.  As there was 6 to 8 weeks delay in starting his first cycle of chemotherapy from the scans, these lesions could have grown during that time.  However his PSA has come down from 196 to161 at cycle 3.  He received cycle 4 on 08/27/2017.  Last treatment was cycle 5 on 09/17/2017. - We discussed the results of the CT scan of the abdomen and pelvis dated 10/06/2017 which showed similar appearance of scattered liver metastatic lesions.  Although unchanged in size, dominant lesion in segment 4 of the liver does demonstrate some increased central necrosis compared to previous.  Diffuse skeletal metastasis are stable. - Cycle 7 chemotherapy was on 11/09/2017.  His PSA has come down by 10 points.  He went on a cruise for 1 week and managed well.  His tingling and numbness in the feet has been stable. - I have reviewed his blood work.  He may proceed with cycle 8 without any dose modifications.  I plan to repeat CT scan of the abdomen and pelvis and bone scan prior to next visit in 3 weeks.  If they continue to show  response, I will cut back on the dose of docetaxel to 50 mg/m.  2.  Peripheral neuropathy: He has constant numbness in the feet even prior to start of chemotherapy.  He takes gabapentin 1 tablet in the morning and 2 tablets at bedtime.  The numbness has not gotten any worse.  He has occasional numbness in the left hand.  This is new.  3.  Chemotherapy-induced anemia: -He received Feraheme second infusion on 09/17/2017.  Hemoglobin today is 9.5.  4.  Cancer related pain: -He takes morphine ER 60 mg every 12 hours.  He is not requiring morphine IR 30 mg on a regular basis.  He takes it as needed.  Pain is fairly well controlled.  Mainly in the lower extremities and back.  5.  Incidental pulmonary embolism: - Diagnosed on a CT scan of the abdomen and pelvis on 10/06/2017 done for follow-up of prostate cancer, showing right lower lobe pulmonary embolus.  CT evidence of right heart strain.  He was hospitalized, started on IV heparin, transition to Xarelto. -He is tolerating Xarelto very well.  He has occasional gum bleeding due to poor dentition.  Happens mainly when he tries to eat.   6.  Bone metastasis: - He will continue denosumab every 4 weeks.  

## 2017-11-30 NOTE — Progress Notes (Signed)
[id number] Labs reviewed with and pt seen by Dr. [name] and pt approved for chemo tx today per MD                                                                     Micheal Davis tolerated Taxotere infusion with Neulasta on-pro well without complaints or incident. VSS upon discharge. Neulasta on-pro applied to pt's right arm with green indicator light flashing upon discharge. Pt discharged self ambulatory using cane in satisfactory condition accompanied by his wife

## 2017-11-30 NOTE — Patient Instructions (Signed)
[facility] Discharge Instructions for Patients Receiving Chemotherapy   Beginning January 23rd 2017 lab work for the Cancer Center will be done in the  Main lab at [facility] on 1st floor. If you have a lab appointment with the Cancer Center please come in thru the  Main Entrance and check in at the main information desk   Today you received the following chemotherapy agents Taxotere as well as Neulasta on pro. Follow-up as scheduled. Call clinic for any questions or concerns  To help prevent nausea and vomiting after your treatment, we encourage you to take your nausea medication   If you develop nausea and vomiting, or diarrhea that is not controlled by your medication, call the clinic.  The clinic phone number is [phone]. Office hours are Monday-Friday 8:30am-5:00pm.  BELOW ARE SYMPTOMS THAT SHOULD BE REPORTED IMMEDIATELY:  *FEVER GREATER THAN 101.0 F  *CHILLS WITH OR WITHOUT FEVER  NAUSEA AND VOMITING THAT IS NOT CONTROLLED WITH YOUR NAUSEA MEDICATION  *UNUSUAL SHORTNESS OF BREATH  *UNUSUAL BRUISING OR BLEEDING  TENDERNESS IN MOUTH AND THROAT WITH OR WITHOUT PRESENCE OF ULCERS  *URINARY PROBLEMS  *BOWEL PROBLEMS  UNUSUAL RASH Items with * indicate a potential emergency and should be followed up as soon as possible. If you have an emergency after office hours please contact your primary care physician or go to the nearest emergency department.  Please call the clinic during office hours if you have any questions or concerns.   You may also contact the Patient Navigator at [phone] should you have any questions or need assistance in obtaining follow up care.      Resources For Cancer Patients and their Caregivers ? [employer]: Can assist with transportation, wigs, general needs, runs Look Good Feel Better.        1-888-227-6333 ? Cancer Care: Provides financial assistance, online support groups, medication/co-pay  assistance.  1-800-813-HOPE (4673) ? [facility] Assists [place] cancer patients and their families through emotional , educational and financial support.  336-427-4357 ? [place] DSS Where to apply for food stamps, Medicaid and utility assistance. 336-342-1394 ? [facility]: Transportation to medical appointments. 336-347-2287 ? Social Security Administration: May apply for disability if have a Stage IV cancer. 336-342-7796 1-800-772-1213 ? [place] [facility]: Assists with nutrition, care and transit needs. 336-349-2343         

## 2017-12-14 ENCOUNTER — Other Ambulatory Visit (HOSPITAL_COMMUNITY): Payer: Self-pay | Admitting: *Deleted

## 2017-12-14 DIAGNOSIS — E1143 Type 2 diabetes mellitus with diabetic autonomic (poly)neuropathy: Secondary | ICD-10-CM | POA: Diagnosis not present

## 2017-12-14 DIAGNOSIS — R748 Abnormal levels of other serum enzymes: Secondary | ICD-10-CM | POA: Diagnosis not present

## 2017-12-14 DIAGNOSIS — E782 Mixed hyperlipidemia: Secondary | ICD-10-CM | POA: Diagnosis not present

## 2017-12-14 DIAGNOSIS — R7301 Impaired fasting glucose: Secondary | ICD-10-CM | POA: Diagnosis not present

## 2017-12-14 DIAGNOSIS — I10 Essential (primary) hypertension: Secondary | ICD-10-CM | POA: Diagnosis not present

## 2017-12-14 DIAGNOSIS — E039 Hypothyroidism, unspecified: Secondary | ICD-10-CM | POA: Diagnosis not present

## 2017-12-15 ENCOUNTER — Ambulatory Visit (HOSPITAL_COMMUNITY)
Admission: RE | Admit: 2017-12-15 | Discharge: 2017-12-15 | Disposition: A | Discharge status: Home / Self Care | Payer: Medicare Other | Source: Ambulatory Visit | Attending: Nurse Practitioner | Admitting: Nurse Practitioner

## 2017-12-15 ENCOUNTER — Encounter (HOSPITAL_COMMUNITY): Payer: Self-pay

## 2017-12-15 DIAGNOSIS — C61 Malignant neoplasm of prostate: Secondary | ICD-10-CM | POA: Diagnosis not present

## 2017-12-15 DIAGNOSIS — C7951 Secondary malignant neoplasm of bone: Secondary | ICD-10-CM | POA: Insufficient documentation

## 2017-12-15 MED ORDER — TECHNETIUM TC 99M MEDRONATE IV KIT
20.0000 | PACK | Freq: Once | INTRAVENOUS | Status: AC | PRN
  Administered 2017-12-15: 19.3 via INTRAVENOUS

## 2017-12-16 DIAGNOSIS — Z23 Encounter for immunization: Secondary | ICD-10-CM | POA: Diagnosis not present

## 2017-12-16 DIAGNOSIS — Z6830 Body mass index (BMI) 30.0-30.9, adult: Secondary | ICD-10-CM | POA: Diagnosis not present

## 2017-12-16 DIAGNOSIS — C61 Malignant neoplasm of prostate: Secondary | ICD-10-CM | POA: Diagnosis not present

## 2017-12-16 DIAGNOSIS — I2699 Other pulmonary embolism without acute cor pulmonale: Secondary | ICD-10-CM | POA: Diagnosis not present

## 2017-12-16 DIAGNOSIS — E119 Type 2 diabetes mellitus without complications: Secondary | ICD-10-CM | POA: Diagnosis not present

## 2017-12-16 DIAGNOSIS — I10 Essential (primary) hypertension: Secondary | ICD-10-CM | POA: Diagnosis not present

## 2017-12-16 DIAGNOSIS — E1143 Type 2 diabetes mellitus with diabetic autonomic (poly)neuropathy: Secondary | ICD-10-CM | POA: Diagnosis not present

## 2017-12-16 DIAGNOSIS — E782 Mixed hyperlipidemia: Secondary | ICD-10-CM | POA: Diagnosis not present

## 2017-12-18 ENCOUNTER — Encounter (HOSPITAL_COMMUNITY): Payer: Self-pay

## 2017-12-18 ENCOUNTER — Ambulatory Visit (HOSPITAL_COMMUNITY)
Admission: RE | Admit: 2017-12-18 | Discharge: 2017-12-18 | Disposition: A | Discharge status: Home / Self Care | Payer: Medicare Other | Source: Ambulatory Visit | Attending: Nurse Practitioner | Admitting: Nurse Practitioner

## 2017-12-18 DIAGNOSIS — N2889 Other specified disorders of kidney and ureter: Secondary | ICD-10-CM | POA: Insufficient documentation

## 2017-12-18 DIAGNOSIS — R591 Generalized enlarged lymph nodes: Secondary | ICD-10-CM | POA: Diagnosis not present

## 2017-12-18 DIAGNOSIS — C61 Malignant neoplasm of prostate: Secondary | ICD-10-CM | POA: Diagnosis not present

## 2017-12-18 DIAGNOSIS — K8020 Calculus of gallbladder without cholecystitis without obstruction: Secondary | ICD-10-CM | POA: Insufficient documentation

## 2017-12-18 DIAGNOSIS — I2510 Atherosclerotic heart disease of native coronary artery without angina pectoris: Secondary | ICD-10-CM | POA: Insufficient documentation

## 2017-12-18 DIAGNOSIS — C7951 Secondary malignant neoplasm of bone: Secondary | ICD-10-CM | POA: Diagnosis not present

## 2017-12-18 DIAGNOSIS — I700 Atherosclerosis of aorta: Secondary | ICD-10-CM | POA: Insufficient documentation

## 2017-12-18 DIAGNOSIS — C787 Secondary malignant neoplasm of liver and intrahepatic bile duct: Secondary | ICD-10-CM | POA: Insufficient documentation

## 2017-12-18 DIAGNOSIS — Z5111 Encounter for antineoplastic chemotherapy: Secondary | ICD-10-CM | POA: Diagnosis not present

## 2017-12-18 MED ORDER — IOPAMIDOL (ISOVUE-300) INJECTION 61%
100.0000 mL | Freq: Once | INTRAVENOUS | Status: AC | PRN
  Administered 2017-12-18: 100 mL via INTRAVENOUS

## 2017-12-19 DIAGNOSIS — Z6829 Body mass index (BMI) 29.0-29.9, adult: Secondary | ICD-10-CM | POA: Diagnosis not present

## 2017-12-19 DIAGNOSIS — E1143 Type 2 diabetes mellitus with diabetic autonomic (poly)neuropathy: Secondary | ICD-10-CM | POA: Diagnosis not present

## 2017-12-19 DIAGNOSIS — C7951 Secondary malignant neoplasm of bone: Secondary | ICD-10-CM | POA: Diagnosis not present

## 2017-12-19 DIAGNOSIS — I10 Essential (primary) hypertension: Secondary | ICD-10-CM | POA: Diagnosis not present

## 2017-12-19 DIAGNOSIS — E782 Mixed hyperlipidemia: Secondary | ICD-10-CM | POA: Diagnosis not present

## 2017-12-19 DIAGNOSIS — C61 Malignant neoplasm of prostate: Secondary | ICD-10-CM | POA: Diagnosis not present

## 2017-12-19 DIAGNOSIS — I2699 Other pulmonary embolism without acute cor pulmonale: Secondary | ICD-10-CM | POA: Diagnosis not present

## 2017-12-19 DIAGNOSIS — R042 Hemoptysis: Secondary | ICD-10-CM | POA: Diagnosis not present

## 2017-12-21 NOTE — Assessment & Plan Note (Addendum)
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  [facility] testing revealed MSI-stable disease, TMB intermediate, KRAS G12D, and AKT3 amplification.  Treatment dates back to 10/04/2012 when he was started on Depo-Lupron + Casodex.  Due to progression of disease, he was switched to Zytiga + Prednisone in 10/2013, then Xtandi + Prednisone in 10/2014, then Docetaxel (11/23/2015- 29/07/6211) complicated by diarrhea requiring dose-reduction.  Eventually, he demonstrated biochemical failure of treatment with rising PSA leading to a change to Cabazitaxel (01/28/2016- 04/29/2017).  Due to progression of disease, he was re-challenged with Docetaxel beginning on 06/25/2017 with minimal biochemical response.  Additionally, he is receiving bone targeted therapy with monthly Xgeva.  He presents today to review restaging scans.    Labs today: CBC diff, CMET, PSA.  I personally reviewed and went over laboratory results with the patient.  The results are noted within this dictation.  Blood counts today demonstrated white blood cell count of 8.2 with normal differential, hemoglobin 7.8 g/dL, platelet count 164,000.  Metabolic panel demonstrates a minimal hypocalcemia with hypoalbuminemia.  Calcium is within normal limits.  PSA is in process.  His hemoglobin has dropped approximately 2 g/dL over the last 3-4 weeks.  Etiology is unclear but he does report gingival bleeding.  I find it hard to believe that he can lose 2 g of blood in a short amount of time from gingival bleeding.  I think myelophthisis must be on our differential as well.  He has been offered packed red blood cell transfusion x2 and we will try to coordinate this with his treatment next week.  I personally reviewed and went over radiographic studies with the patient.  The results are noted within this dictation.  I personally  reviewed the images in PACS.  Restaging CT CAP 12/18/2017 demonstrated enlarging liver metastases and slight worsening and osseous metastatic disease, new borderline enlarged right axillary/subpectoral lymph nodes and a new subcentimeter left periaortic retroperitoneal lymph node worrisome for progressive metastatic disease.  Radiographic findings in combination with poorly responding PSA value are concerning for failure of therapy.    Treatment options moving forward include best supportive care versus salvage Mitoxantrone + prednisone.  He has opted for ongoing treatment.  He is advised that this is his last treatment option.    Xgeva injection will be transition to every 6 weeks to coincide with treatment dates.  I have built Mitoxantrone + Prednisone treatment plan.  I do not have access to [facility] (I used to have access in the past).  We will plan on starting treatment next week.  He will require chemotherapy teaching for this treatment plan.  I have discontinued his Docetaxel chemotherapy plan due to progression of disease.  Return next week to embark on Mitoxantrone + Prednisone salvage therapy.  We will continue with Depo-Lupron every 3 months and will transition Xgeva to every 6 weeks to coincide with treatment.

## 2017-12-22 ENCOUNTER — Inpatient Hospital Stay (HOSPITAL_COMMUNITY): Payer: Medicare Other

## 2017-12-22 ENCOUNTER — Inpatient Hospital Stay (HOSPITAL_BASED_OUTPATIENT_CLINIC_OR_DEPARTMENT_OTHER): Payer: Medicare Other | Admitting: Oncology

## 2017-12-22 ENCOUNTER — Inpatient Hospital Stay (HOSPITAL_COMMUNITY): Payer: Medicare Other | Attending: Hematology

## 2017-12-22 ENCOUNTER — Encounter (HOSPITAL_COMMUNITY): Payer: Self-pay | Admitting: Oncology

## 2017-12-22 ENCOUNTER — Other Ambulatory Visit (HOSPITAL_COMMUNITY): Payer: Self-pay | Admitting: *Deleted

## 2017-12-22 ENCOUNTER — Other Ambulatory Visit: Payer: Self-pay

## 2017-12-22 VITALS — BP 147/77 | HR 79 | Temp 98.0°F | Resp 18 | Wt 216.0 lb

## 2017-12-22 DIAGNOSIS — Z7901 Long term (current) use of anticoagulants: Secondary | ICD-10-CM

## 2017-12-22 DIAGNOSIS — C7951 Secondary malignant neoplasm of bone: Secondary | ICD-10-CM | POA: Diagnosis not present

## 2017-12-22 DIAGNOSIS — D6481 Anemia due to antineoplastic chemotherapy: Secondary | ICD-10-CM

## 2017-12-22 DIAGNOSIS — Z79899 Other long term (current) drug therapy: Secondary | ICD-10-CM | POA: Insufficient documentation

## 2017-12-22 DIAGNOSIS — Z923 Personal history of irradiation: Secondary | ICD-10-CM | POA: Diagnosis not present

## 2017-12-22 DIAGNOSIS — K068 Other specified disorders of gingiva and edentulous alveolar ridge: Secondary | ICD-10-CM

## 2017-12-22 DIAGNOSIS — K1379 Other lesions of oral mucosa: Secondary | ICD-10-CM

## 2017-12-22 DIAGNOSIS — Z5111 Encounter for antineoplastic chemotherapy: Secondary | ICD-10-CM

## 2017-12-22 DIAGNOSIS — C787 Secondary malignant neoplasm of liver and intrahepatic bile duct: Secondary | ICD-10-CM

## 2017-12-22 DIAGNOSIS — E1140 Type 2 diabetes mellitus with diabetic neuropathy, unspecified: Secondary | ICD-10-CM | POA: Insufficient documentation

## 2017-12-22 DIAGNOSIS — I2699 Other pulmonary embolism without acute cor pulmonale: Secondary | ICD-10-CM | POA: Insufficient documentation

## 2017-12-22 DIAGNOSIS — E079 Disorder of thyroid, unspecified: Secondary | ICD-10-CM | POA: Insufficient documentation

## 2017-12-22 DIAGNOSIS — Z7189 Other specified counseling: Secondary | ICD-10-CM

## 2017-12-22 DIAGNOSIS — D649 Anemia, unspecified: Secondary | ICD-10-CM | POA: Diagnosis not present

## 2017-12-22 DIAGNOSIS — C61 Malignant neoplasm of prostate: Secondary | ICD-10-CM | POA: Insufficient documentation

## 2017-12-22 DIAGNOSIS — I10 Essential (primary) hypertension: Secondary | ICD-10-CM | POA: Diagnosis not present

## 2017-12-22 DIAGNOSIS — E119 Type 2 diabetes mellitus without complications: Secondary | ICD-10-CM | POA: Diagnosis not present

## 2017-12-22 DIAGNOSIS — S065X9A Traumatic subdural hemorrhage with loss of consciousness of unspecified duration, initial encounter: Secondary | ICD-10-CM

## 2017-12-22 DIAGNOSIS — S065XAA Traumatic subdural hemorrhage with loss of consciousness status unknown, initial encounter: Secondary | ICD-10-CM

## 2017-12-22 LAB — COMPREHENSIVE METABOLIC PANEL
ALBUMIN: 3 g/dL — AB (ref 3.5–5.0)
ALT: 23 U/L (ref 0–44)
ANION GAP: 11 (ref 5–15)
AST: 28 U/L (ref 15–41)
Alkaline Phosphatase: 172 U/L — ABNORMAL HIGH (ref 38–126)
BUN: 11 mg/dL (ref 8–23)
CO2: 23 mmol/L (ref 22–32)
Calcium: 8.7 mg/dL — ABNORMAL LOW (ref 8.9–10.3)
Chloride: 100 mmol/L (ref 98–111)
Creatinine, Ser: 0.73 mg/dL (ref 0.61–1.24)
GFR calc non Af Amer: >60 mL/min (ref >60)
GLUCOSE: 188 mg/dL — AB (ref 70–99)
POTASSIUM: 4 mmol/L (ref 3.5–5.1)
SODIUM: 134 mmol/L — AB (ref 135–145)
TOTAL PROTEIN: 6.6 g/dL (ref 6.5–8.1)
Total Bilirubin: 0.8 mg/dL (ref 0.3–1.2)

## 2017-12-22 LAB — CBC WITH DIFFERENTIAL/PLATELET
Abs Immature Granulocytes: 0.36 10*3/uL — ABNORMAL HIGH (ref 0.00–0.07)
BASOS ABS: 0.1 10*3/uL (ref 0.0–0.1)
BASOS PCT: 1 %
EOS PCT: 0 %
Eosinophils Absolute: 0 10*3/uL (ref 0.0–0.5)
HCT: 26.2 % — ABNORMAL LOW (ref 39.0–52.0)
HEMOGLOBIN: 7.8 g/dL — AB (ref 13.0–17.0)
Immature Granulocytes: 4 %
LYMPHS PCT: 12 %
Lymphs Abs: 1 10*3/uL (ref 0.7–4.0)
MCH: 28.4 pg (ref 26.0–34.0)
MCHC: 29.8 g/dL — AB (ref 30.0–36.0)
MCV: 95.3 fL (ref 80.0–100.0)
Monocytes Absolute: 0.5 10*3/uL (ref 0.1–1.0)
Monocytes Relative: 7 %
NRBC: 0.4 % — AB (ref 0.0–0.2)
Neutro Abs: 6.3 10*3/uL (ref 1.7–7.7)
Neutrophils Relative %: 76 %
Platelets: 164 10*3/uL (ref 150–400)
RBC: 2.75 MIL/uL — AB (ref 4.22–5.81)
RDW: 18.8 % — AB (ref 11.5–15.5)
WBC: 8.2 10*3/uL (ref 4.0–10.5)

## 2017-12-22 LAB — PSA: PROSTATIC SPECIFIC ANTIGEN: 319 ng/mL — AB (ref 0.00–4.00)

## 2017-12-22 MED ORDER — FIRST-DUKES MOUTHWASH MT SUSP: 15.0000 mL | Freq: Four times a day (QID) | OROMUCOSAL | 1 refills | Status: DC | PRN

## 2017-12-22 NOTE — Patient Instructions (Signed)
[facility] Chemotherapy Teaching   You have been diagnosed with metastatic prostate cancer. We are going to treat you with palliative intent, which means your cancer is treatable but not curable. We are going to treat you with chemotherapy, Mitoxantrone. You will also take prednisone twice daily with this treatment.  We will give the chemotherapy through your port a cath. We will give you chemotherapy every 21 days. You will see the doctor regularly throughout treatment.  We monitor your lab work prior to every treatment. The doctor monitors your response to treatment by the way you are feeling, your blood work, and scans periodically.  There will be wait times while you are here for treatment.  It will take about 30 minutes to 1 hour for your lab work to result.  Then there will be wait times while pharmacy mixes your medications.   You will be given the following premedications prior to each infusion: Compazine - antiemetic to prevent nausea induced by chemotherapy.    Mitoxantrone  About This Drug Mitoxantrone is used to treat cancer. It is given in the vein (IV).  Possible Side Effects . Soreness of the mouth and throat. You may have red areas, white patches, or sores that hurt. . Nausea and vomiting (throwing up) . Diarrhea (loose bowel movements) . Constipation (unable to move bowels) . Pain in your abdomen . Fever and chills . Respiratory tract infection . In women, menstrual bleeding may become irregular or stop while you are getting this drug. Do not assume that you cannot become pregnant if you do not have a menstrual period. . Hair loss. Hair loss is often temporary, although with certain medicine, hair loss can sometimes be permanent. Hair loss may happen suddenly or gradually. If you lose hair, you may lose it from your head, face, armpits, pubic area, chest, and/or legs. You may also notice your hair getting thin.  Note: Not all possible side effects are  included above.  Warnings and Precautions . Severe bone marrow suppression. This is a decrease in the number of white blood cells, red blood cells, and platelets. This may raise your risk of infection, make you tired and weak (fatigue), and raise your risk of bleeding. . Congestive heart failure which may be life-threatening. You may be short of breath. Your arms, hands, legs and feet may swell. . This drug may raise your risk of getting a second cancer such as acute leukemia . Allergic reactions, including anaphylaxis are rare but may happen in some patients. Signs of allergic reaction to this drug may be swelling of the face, feeling like your tongue or throat are swelling, trouble breathing, rash, itching, fever, chills, feeling dizzy, and/or feeling that your heart is beating in a fast or not normal way. If this happens, do not take another dose of this drug. You should get urgent medical treatment. . Skin and tissue irritation including redness, pain, warmth, or swelling at the IV site if the drug leaks out of the vein and into nearby tissue.  Note: Some of the side effects above are very rare. If you have concerns and/or questions, please discuss them with your medical team.  Important Information . This drug may be present in the saliva, tears, sweat, urine, stool, vomit, semen, and vaginal secretions. Talk to your doctor and/or your nurse about the necessary precautions to take during this time. . Urine color may be blueish or green for several hours after you get this drug. This will slowly  go away within one to two days.  Treating Side Effects . Manage tiredness by pacing your activities for the day. . Be sure to include periods of rest between energy-draining activities. . To decrease the risk of infection, wash your hands regularly. . Avoid close contact with people who have a cold, the flu, or other infections. . Take your temperature as your doctor or nurse tells you,  and whenever you feel like you may have a fever. . To help decrease the risk of bleeding, use a soft toothbrush. Check with your nurse before using dental floss. . Be very careful when using knives or tools. . Use an electric shaver instead of a razor. Marland Kitchen Keeping your pain under control is important to your well-being. Please tell your doctor or nurse if you are experiencing pain. . Drink plenty of fluids (a minimum of eight glasses per day is recommended). . Mouth care is very important. Your mouth care should consist of routine, gentle cleaning of your teeth or dentures and rinsing your mouth with a mixture of 1/2 teaspoon of salt in 8 ounces of water or 1/2 teaspoon of baking soda in 8 ounces of water. This should be done at least after each meal and at bedtime. . If you have mouth sores, avoid mouthwash that has alcohol. Also avoid alcohol and smoking because they can bother your mouth and throat. . If you throw up or have loose bowel movements, you should drink more fluids so that you do not become dehydrated (lack of water in the body from losing too much fluid). . To help with nausea and vomiting, eat small, frequent meals instead of three large meals a day. Choose foods and drinks that are at room temperature. Ask your nurse or doctor about other helpful tips and medicine that is available to help stop or lessen these symptoms. . If you have diarrhea, eat low-fiber foods that are high in protein and calories and avoid foods that can irritate your digestive tracts or lead to cramping. . Ask your nurse or doctor about medicine that can lessen or stop your diarrhea and/or constipation. . If you are not able to move your bowels, check with your doctor or nurse before you use enemas, laxatives, or suppositories. . To help with hair loss, wash with a mild shampoo and avoid washing your hair every day. . Avoid rubbing your scalp, pat your hair or scalp dry. . Avoid coloring your hair. .  Limit your use of hair spray, electric curlers, blow dryers, and curling irons. . If you are interested in getting a wig, talk to your nurse. You can also call the [facility] at 800-ACS-2345 to find out information about the "Look Good, Feel Better" program close to where you live. It is a free program where women getting chemotherapy can learn about wigs, turbans and scarves as well as makeup techniques and skin and nail care.  Food and Drug Interactions . There are no known interactions of mitoxantrone with food. . Check with your doctor or pharmacist about all other prescription medicines and over-the-counter medicines and dietary supplements (vitamins, minerals, herbs and others) you are taking before starting this medicine as there are known drug interactions with mitoxantrone. Also, check with your doctor or pharmacist before starting any new prescription or over-the-counter medicines, or dietary supplements to make sure that there are no interactions.  When to Call the Doctor Call your doctor or nurse if you have any of these symptoms and/or any  new or unusual symptoms: . Fever of 100.4 F (38 C) or higher . Chills . Tiredness that interferes with your daily activities . Feeling dizzy or lightheaded . Easy bleeding or bruising . Feeling that your heart is beating in a fast or not normal way (palpitations) . Wheezing or trouble breathing . Cough including coughing up yellow, green or bloody mucus . Pain in your mouth or throat that makes it hard to eat or drink . Pain in your abdomen that does not go away . Nausea that stops you from eating or drinking and/or is not relieved by prescribed medicines . Throwing up more than 3 times a day . No bowel movement in 3 days or when you feel uncomfortable . Diarrhea 4 times in one day or diarrhea with lack of strength or a feeling of being dizzy . Swelling of arms, hands, legs, ankles, or feet . Weight gain of 5 pounds in one  week (fluid retention) . Signs of allergic reaction: swelling of the face, feeling like your tongue or throat are swelling, trouble breathing, rash, itching, fever, chills, feeling dizzy, and/or feeling that your heart is beating in a fast or not normal way. If this happens, call 911 for emergency care.   SELF CARE ACTIVITIES WHILE ON CHEMOTHERAPY:  Hydration Increase your fluid intake 48 hours prior to treatment and drink at least 8 to 12 cups (64 ounces) of water/decaffeinated beverages per day after treatment. You can still have your cup of coffee or soda but these beverages do not count as part of your 8 to 12 cups that you need to drink daily. No alcohol intake.  Medications Continue taking your normal prescription medication as prescribed.  If you start any new herbal or new supplements please let us know first to make sure it is safe.  Mouth Care Have teeth cleaned professionally before starting treatment. Keep dentures and partial plates clean. Use soft toothbrush and do not use mouthwashes that contain alcohol. Biotene is a good mouthwash that is available at most pharmacies or may be ordered by calling [phone]. Use warm salt water gargles (1 teaspoon salt per 1 quart warm water) before and after meals and at bedtime. Or you may rinse with 2 tablespoons of three-percent hydrogen peroxide mixed in eight ounces of water. If you are still having problems with your mouth or sores in your mouth please call the clinic. If you need dental work, please let the doctor know before you go for your appointment so that we can coordinate the best possible time for you in regards to your chemo regimen. You need to also let your dentist know that you are actively taking chemo. We may need to do labs prior to your dental appointment.  Skin Care Always use sunscreen that has not expired and with SPF (Sun Protection Factor) of 50 or higher. Wear hats to protect your head from the sun. Remember to use  sunscreen on your hands, ears, face, & feet.  Use good moisturizing lotions such as udder cream, eucerin, or even Vaseline. Some chemotherapies can cause dry skin, color changes in your skin and nails.    . Avoid long, hot showers or baths. . Use gentle, fragrance-free soaps and laundry detergent. . Use moisturizers, preferably creams or ointments rather than lotions because the thicker consistency is better at preventing skin dehydration. Apply the cream or ointment within 15 minutes of showering. Reapply moisturizer at night, and moisturize your hands every time after you wash them.  Hair Loss (if your doctor says your hair will fall out)  . If your doctor says that your hair is likely to fall out, decide before you begin chemo whether you want to wear a wig. You may want to shop before treatment to match your hair color. . Hats, turbans, and scarves can also camouflage hair loss, although some people prefer to leave their heads uncovered. If you go bare-headed outdoors, be sure to use sunscreen on your scalp. . Cut your hair short. It eases the inconvenience of shedding lots of hair, but it also can reduce the emotional impact of watching your hair fall out. . Don't perm or color your hair during chemotherapy. Those chemical treatments are already damaging to hair and can enhance hair loss. Once your chemo treatments are done and your hair has grown back, it's OK to resume dyeing or perming hair. With chemotherapy, hair loss is almost always temporary. But when it grows back, it may be a different color or texture. In older adults who still had hair color before chemotherapy, the new growth may be completely gray.  Often, new hair is very fine and soft.  Infection Prevention Please wash your hands for at least 30 seconds using warm soapy water. Handwashing is the #1 way to prevent the spread of germs. Stay away from sick people or people who are getting over a cold. If you develop respiratory  systems such as green/yellow mucus production or productive cough or persistent cough let us know and we will see if you need an antibiotic. It is a good idea to keep a pair of gloves on when going into grocery stores/[employer] to decrease your risk of coming into contact with germs on the carts, etc. Carry alcohol hand gel with you at all times and use it frequently if out in public. If your temperature reaches 100.5 or higher please call the clinic and let us know.  If it is after hours or on the weekend please go to the ER if your temperature is over 100.5.  Please have your own personal thermometer at home to use.    Sex and bodily fluids If you are going to have sex, a condom must be used to protect the person that isn't taking chemotherapy. Chemo can decrease your libido (sex drive). For a few days after chemotherapy, chemotherapy can be excreted through your bodily fluids.  When using the toilet please close the lid and flush the toilet twice.  Do this for a few day after you have had chemotherapy.   Effects of chemotherapy on your sex life Some changes are simple and won't last long. They won't affect your sex life permanently. Sometimes you may feel: . too tired . not strong enough to be very active . sick or sore  . not in the mood . anxious or low Your anxiety might not seem related to sex. For example, you may be worried about the cancer and how your treatment is going. Or you may be worried about money, or about how you family are coping with your illness. These things can cause stress, which can affect your interest in sex. It's important to talk to your partner about how you feel. Remember - the changes to your sex life don't usually last long. There's usually no medical reason to stop having sex during chemo. The drugs won't have any long term physical effects on your performance or enjoyment of sex. Cancer can't be passed on to your partner during  sex  Contraception It's important to  use reliable contraception during treatment. Avoid getting pregnant while you or your partner are having chemotherapy. This is because the drugs may harm the baby. Sometimes chemotherapy drugs can leave a man or woman infertile.  This means you would not be able to have children in the future. You might want to talk to someone about permanent infertility. It can be very difficult to learn that you may no longer be able to have children. Some people find counselling helpful. There might be ways to preserve your fertility, although this is easier for men than for women. You may want to speak to a fertility expert. You can talk about sperm banking or harvesting your eggs. You can also ask about other fertility options, such as donor eggs. If you have or have had breast cancer, your doctor might advise you not to take the contraceptive pill. This is because the hormones in it might affect the cancer.  It is not known for sure whether or not chemotherapy drugs can be passed on through semen or secretions from the vagina. Because of this some doctors advise people to use a barrier method if you have sex during treatment. This applies to vaginal, anal or oral sex. Generally, doctors advise a barrier method only for the time you are actually having the treatment and for about a week after your treatment. Advice like this can be worrying, but this does not mean that you have to avoid being intimate with your partner. You can still have close contact with your partner and continue to enjoy sex.  Animals If you have cats or birds we just ask that you not change the litter or change the cage.  Please have someone else do this for you while you are on chemotherapy.   Food Safety During and After Cancer Treatment Food safety is important for people both during and after cancer treatment. Cancer and cancer treatments, such as chemotherapy, radiation therapy, and stem cell/bone marrow transplantation, often weaken the  immune system. This makes it harder for your body to protect itself from foodborne illness, also called food poisoning. Foodborne illness is caused by eating food that contains harmful bacteria, parasites, or viruses.  Foods to avoid Some foods have a higher risk of becoming tainted with bacteria. These include: Marland Kitchen Unwashed fresh fruit and vegetables, especially leafy vegetables that can hide dirt and other contaminants . Raw sprouts, such as alfalfa sprouts . Raw or undercooked beef, especially ground beef, or other raw or undercooked meat and poultry . Fatty, fried, or spicy foods immediately before or after treatment.  These can sit heavy on your stomach and make you feel nauseous. . Raw or undercooked shellfish, such as oysters. . Sushi and sashimi, which often contain raw fish.  . Unpasteurized beverages, such as unpasteurized fruit juices, raw milk, raw yogurt, or cider . Undercooked eggs, such as soft boiled, over easy, and poached; raw, unpasteurized eggs; or foods made with raw egg, such as homemade raw cookie dough and homemade mayonnaise Simple steps for food safety Shop smart. . Do not buy food stored or displayed in an unclean area. . Do not buy bruised or damaged fruits or vegetables. . Do not buy cans that have cracks, dents, or bulges. . Pick up foods that can spoil at the end of your shopping trip and store them in a cooler on the way home. Prepare and clean up foods carefully. . Rinse all fresh fruits and vegetables under  running water, and dry them with a clean towel or paper towel. . Clean the top of cans before opening them. . After preparing food, wash your hands for 20 seconds with hot water and soap. Pay special attention to areas between fingers and under nails. . Clean your utensils and dishes with hot water and soap. Marland Kitchen Disinfect your kitchen and cutting boards using 1 teaspoon of liquid, unscented bleach mixed into 1 quart of water.   Dispose of old food. . Eat  canned and packaged food before its expiration date (the "use by" or "best before" date). . Consume refrigerated leftovers within 3 to 4 days. After that time, throw out the food. Even if the food does not smell or look spoiled, it still may be unsafe. Some bacteria, such as Listeria, can grow even on foods stored in the refrigerator if they are kept for too long. Take precautions when eating out. . At restaurants, avoid buffets and salad bars where food sits out for a long time and comes in contact with many people. Food can become contaminated when someone with a virus, often a norovirus, or another "bug" handles it. . Put any leftover food in a "to-go" container yourself, rather than having the server do it. And, refrigerate leftovers as soon as you get home. . Choose restaurants that are clean and that are willing to prepare your food as you order it cooked.   MEDICATIONS:                                                                                                                                                                Compazine/Prochlorperazine 10mg  tablet. Take 1 tablet every 6 hours as needed for nausea/vomiting. (This can make you sleepy)   EMLA cream. Apply a quarter size amount to port site 1 hour prior to chemo. Do not rub in. Cover with plastic wrap.   Over-the-Counter Meds:  Colace - 100 mg capsules - take 2 capsules daily.  If this doesn't help then you can increase to 2 capsules twice daily.  Call us if this does not help your bowels move.   Imodium 2mg  capsule. Take 2 capsules after the 1st loose stool and then 1 capsule every 2 hours until you go a total of 12 hours without having a loose stool. Call the [facility] if loose stools continue. If diarrhea occurs at bedtime, take 2 capsules at bedtime. Then take 2 capsules every 4 hours until morning. Call [facility].    Diarrhea Sheet   If you are having loose stools/diarrhea, please purchase Imodium and  begin taking as outlined:  At the first sign of poorly formed or loose stools you should begin taking Imodium (loperamide) 2 mg capsules.  Take two caplets (4mg ) followed by  one caplet (2mg ) every 2 hours until you have had no diarrhea for 12 hours.  During the night take two caplets (4mg ) at bedtime and continue every 4 hours during the night until the morning.  Stop taking Imodium only after there is no sign of diarrhea for 12 hours.    Always call the [facility] if you are having loose stools/diarrhea that you can't get under control.  Loose stools/diarrhea leads to dehydration (loss of water) in your body.  We have other options of trying to get the loose stools/diarrhea to stop but you must let us know!   Constipation Sheet  Colace - 100 mg capsules - take 2 capsules daily.  If this doesn't help then you can increase to 2 capsules twice daily.  Please call if the above does not work for you.   Do not go more than 2 days without a bowel movement.  It is very important that you do not become constipated.  It will make you feel sick to your stomach (nausea) and can cause abdominal pain and vomiting.   Nausea Sheet   Compazine/Prochlorperazine 10mg  tablet. Take 1 tablet every 6 hours as needed for nausea/vomiting. (This can make you sleepy)  If you are having persistent nausea (nausea that does not stop) please call the [facility] and let us know the amount of nausea that you are experiencing.  If you begin to vomit, you need to call the [facility] and if it is the weekend and you have vomited more than one time and can't get it to stop-go to the Emergency Room.  Persistent nausea/vomiting can lead to dehydration (loss of fluid in your body) and will make you feel terrible.   Ice chips, sips of clear liquids, foods that are @ room temperature, crackers, and toast tend to be better tolerated.   SYMPTOMS TO REPORT AS SOON AS POSSIBLE AFTER TREATMENT:   FEVER GREATER THAN 100.5  F  CHILLS WITH OR WITHOUT FEVER  NAUSEA AND VOMITING THAT IS NOT CONTROLLED WITH YOUR NAUSEA MEDICATION  UNUSUAL SHORTNESS OF BREATH  UNUSUAL BRUISING OR BLEEDING  TENDERNESS IN MOUTH AND THROAT WITH OR WITHOUT PRESENCE OF ULCERS  URINARY PROBLEMS  BOWEL PROBLEMS  UNUSUAL RASH      Wear comfortable clothing and clothing appropriate for easy access to any Portacath or PICC line. Let us know if there is anything that we can do to make your therapy better!    What to do if you need assistance after hours or on the weekends: CALL [phone].  HOLD on the line, do not hang up.  You will hear multiple messages but at the end you will be connected with a nurse triage line.  They will contact the doctor if necessary.  Most of the time they will be able to assist you.  Do not call the hospital operator.      I have been informed and understand all of the instructions given to me and have received a copy. I have been instructed to call the clinic [phone] or my family physician as soon as possible for continued medical care, if indicated. I do not have any more questions at this time but understand that I may call the [facility] or the Patient Navigator at [phone] during office hours should I have questions or need assistance in obtaining follow-up care.

## 2017-12-22 NOTE — Progress Notes (Signed)
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  FoundationOne testing revealed MSI-stable disease, TMB intermediate, KRAS G12D, and AKT3 amplification.  Treatment dates back to 10/04/2012 when he was started on Depo-Lupron + Casodex.  Due to progression of disease, he was switched to Zytiga + Prednisone in 10/2013, then Xtandi + Prednisone in 10/2014, then Docetaxel (11/23/2015- 37/08/2829) complicated by diarrhea requiring dose-reduction.  Eventually, he demonstrated biochemical failure of treatment with rising PSA leading to a change to Cabazitaxel (01/28/2016- 04/29/2017).  Due to progression of disease, he was re-challenged with Docetaxel beginning on 06/25/2017 with minimal biochemical response.  Additionally, he is receiving bone targeted therapy with monthly Xgeva.  He presents today to review restaging scans.  CURRENT THERAPY: Docetaxel, but now with radiographic evidence of progression of disease.  Depo-Lupron every 3 months.  Xgeva every 4 weeks.  CURRENT STATUS: Micheal Davis 72 y.o. male returns for followup of metastatic prostate cancer to bone and liver.  He recently underwent restaging scans that unfortunately demonstrated progression of disease.  From an oncology perspective he is doing reasonably well symptomatically.  He does report a left shoulder  discomfort that is new over the past week.  I question whether this is referred pain from his progressive liver involvement.  He is also on anticoagulation with Xarelto due to a pulmonary embolism and he reports gum bleeding for the last 6 weeks.  He has a history of "nodularity of gums requiring oral surgery intervention" in the past.  He notes that he is having bleeding from these gum issues.  His hemoglobin has dropped significantly over the last 3 weeks (approximately 2 g/dL).  He otherwise denies any new complaints.  He is concerned about his gingival bleeding and he is positive it is gingival bleeding and not bleeding from nose, etc.  He has follow-up with dentist in December 2019.  He is here today to review recent scans and learn medical oncology recommendations moving forward.  Review of Systems  Constitutional: Negative.  Negative for chills, fever and weight loss.  HENT: Negative.   Eyes: Negative.   Respiratory: Negative.  Negative for cough.   Cardiovascular: Positive for leg swelling. Negative for chest pain.  Gastrointestinal: Negative.  Negative for blood in stool, constipation, diarrhea, melena, nausea and vomiting.  Genitourinary: Negative.   Musculoskeletal: Negative.   Skin: Negative.   Neurological: Positive for tingling. Negative for weakness.  Endo/Heme/Allergies: Negative.   Psychiatric/Behavioral: Negative.     Past Medical History:  Diagnosis Date  . Diabetes mellitus without complication (Micheal Davis)   . Hypertension   . Prostate cancer ([name]) 09/05/2015  . Prostate cancer metastatic to bone ([name]) 09/05/2015  . Sleep apnea   . Thyroid disease     Past Surgical History:  Procedure Laterality Date  . CATARACT EXTRACTION    . PORTACATH PLACEMENT Left 12/31/2015   Procedure: INSERTION PORT-A-CATH LEFT SUBCLAVIAN;  Surgeon: Aviva Signs, MD;  Location: [facility];  Service: General;  Laterality: Left;  . REPLACEMENT TOTAL KNEE Left     Family History  Problem Relation Age  of Onset  . Renal Disease Father   . Vascular Disease Father        Carotid artery disease  . Hypertension Brother     Social History   Socioeconomic History  . Marital status: Married    Spouse name: Not on file  . Number of children: Not on file  . Years of education: Not on file  . Highest education level: Not on file  Occupational History  . Not on file  Social Needs  . Financial resource strain: Not on file  . Food insecurity:    Worry: Not on file    Inability: Not on file  . Transportation needs:    Medical: Not on file    Non-medical: Not on file  Tobacco Use  . Smoking status: Never Smoker  . Smokeless tobacco: Never Used  Substance and Sexual Activity  . Alcohol use: Yes    Comment: 1 beer each month  . Drug use: No  . Sexual activity: Never    Comment: married  Lifestyle  . Physical activity:    Days per week: Not on file    Minutes per session: Not on file  . Stress: Not on file  Relationships  . Social connections:    Talks on phone: Not on file    Gets together: Not on file    Attends religious service: Not on file    Active member of club or organization: Not on file    Attends meetings of clubs or organizations: Not on file    Relationship status: Not on file  Other Topics Concern  . Not on file  Social History Narrative  . Not on file     PHYSICAL EXAMINATION  ECOG PERFORMANCE STATUS: 1 - Symptomatic but completely ambulatory  Vitals:   12/22/17 0945  BP: (!) 147/77  Pulse: 79  Resp: 18  Temp: 98 F (36.7 C)  SpO2: 99%    GENERAL:alert, no distress, well nourished, well developed, comfortable, cooperative, obese, pale, smiling and accompanied by wife SKIN: skin color, texture, turgor are normal, no rashes or significant lesions HEAD: Normocephalic, No masses, lesions, tenderness or abnormalities EYES: normal, EOMI, Conjunctiva are pink and non-injected EARS: External ears normal OROPHARYNX:lips, buccal mucosa, and tongue normal  and ulcers noted on right and the left lower gums. NECK: supple, no adenopathy, trachea midline LYMPH:  not examined BREAST:not examined LUNGS: clear to auscultation and percussion HEART: regular rate & rhythm, no murmurs, no gallops, S1 normal and S2 normal ABDOMEN:abdomen soft, non-tender, obese and normal bowel sounds BACK: Back symmetric, no curvature. EXTREMITIES:less then 2 second capillary refill, no joint deformities, effusion, or inflammation, no skin discoloration, no cyanosis, positive findings:  edema of lower extremity, 1+ bilaterally NEURO: alert & oriented x 3 with fluent speech, no focal motor/sensory deficits, gait normal  LABORATORY DATA: CBC    Component Value Date/Time   WBC 8.2 12/22/2017 0915   RBC 2.75 (L) 12/22/2017 0915   HGB 7.8 (L) 12/22/2017 0915   HCT 26.2 (L) 12/22/2017 0915   PLT 164 12/22/2017 0915   MCV 95.3 12/22/2017 0915   MCH 28.4 12/22/2017 0915   MCHC 29.8 (L) 12/22/2017 0915   RDW 18.8 (H) 12/22/2017 0915   LYMPHSABS 1.0 12/22/2017 0915   MONOABS 0.5 12/22/2017 0915   EOSABS 0.0 12/22/2017 0915   BASOSABS 0.1 12/22/2017 0915  Chemistry      Component Value Date/Time   NA 134 (L) 12/22/2017 0915   K 4.0 12/22/2017 0915   CL 100 12/22/2017 0915   CO2 23 12/22/2017 0915   BUN 11 12/22/2017 0915   CREATININE 0.73 12/22/2017 0915      Component Value Date/Time   CALCIUM 8.7 (L) 12/22/2017 0915   ALKPHOS 172 (H) 12/22/2017 0915   AST 28 12/22/2017 0915   ALT 23 12/22/2017 0915   BILITOT 0.8 12/22/2017 0915       RADIOGRAPHIC STUDIES:  Ct Chest W Contrast  Result Date: 12/18/2017 CLINICAL DATA:  Prostate cancer metastatic to liver and bones. Chemotherapy. Radiation therapy completed. EXAM: CT CHEST, ABDOMEN, AND PELVIS WITH CONTRAST TECHNIQUE: Multidetector CT imaging of the chest, abdomen and pelvis was performed following the standard protocol during bolus administration of intravenous contrast. CONTRAST:  147m ISOVUE-300  IOPAMIDOL (ISOVUE-300) INJECTION 61% COMPARISON:  Bone scan 12/15/2017, CT abdomen pelvis 10/06/2017 and CT chest abdomen pelvis 08/25/2017. FINDINGS: CT CHEST FINDINGS Cardiovascular: Left IJ Port-A-Cath has low-attenuation along the tubing, within the left internal jugular and brachiocephalic veins, with the tip terminating at the brachiocephalic vein junction. Atherosclerotic calcification of the arterial vasculature, including coronary arteries. Heart is at the upper limits of normal in size to mildly enlarged. No pericardial effusion. Mediastinum/Nodes: No pathologically enlarged mediastinal or hilar lymph nodes. There are new borderline enlarged right axillary/subpectoral lymph nodes, measuring up to 11 mm. No left axillary adenopathy. Fluid density structure in the left anterior scalene region measures 2 cm, as before. Esophagus is grossly unremarkable. Lungs/Pleura: 4 mm subpleural nodule along the minor fissure is unchanged. Mild scarring in the anterior right lower lobe. No pleural fluid. Airway is unremarkable. Musculoskeletal: Mottled sclerosis is seen throughout the visualized osseous structures and appears mildly progressive. For example, there is increased sclerosis in the T12 vertebral body when compared with 10/06/2017. Old rib fractures. CT ABDOMEN PELVIS FINDINGS Hepatobiliary: There are enlarging hypoattenuating masses in both lobes of the liver. The largest is seen in segment 4, measuring 6.5 x 7.1 cm, with a central necrotic core, previously 5.1 x 5.5 cm on 10/06/2017. A stone is seen in the gallbladder, which appears somewhat distended. No biliary ductal dilatation. Pancreas: Negative. Spleen: Negative. Adrenals/Urinary Tract: Adrenal glands are unremarkable. Low-attenuation lesion in the interpolar right kidney measures 2.0 cm and is likely a cyst. A 1.5 cm lesion in the left kidney measures 17 Hounsfield units, too small to characterize. Subcentimeter low-attenuation lesion in the upper pole  left kidney is likely a cyst. Ureters are decompressed. There may be ventral bladder wall thickening. Stomach/Bowel: Stomach, small bowel and appendix are unremarkable. Fair amount of stool is seen in the colon, indicative of constipation. Vascular/Lymphatic: Atherosclerotic calcification of the arterial vasculature without abdominal aortic aneurysm. Although a 7 mm low left periaortic lymph node (series 2, image 84) is not pathologically enlarged by CT size criteria, it is new from 10/06/2017. Reproductive: Prostate is visualized. Other: Mild presacral edema, new or increased.  No free fluid. Musculoskeletal: Mottled sclerosis is seen throughout the visualized osseous structures. Bilateral L5 pars defects with grade 1 anterolisthesis, as before. IMPRESSION: 1. Enlarging liver metastases and slight worsening in osseous metastatic disease. 2. New borderline enlarged right axillary/subpectoral lymph nodes and a new subcentimeter left periaortic retroperitoneal lymph node, findings worrisome for metastatic disease. 3. 1.5 cm lesion in the left kidney is difficult to characterize as a simple cyst. Continued attention on follow-up exams is warranted. 4. Aortic atherosclerosis (  ICD10-170.0). Coronary artery calcification. 5. Cholelithiasis. Electronically Signed   By: Lorin Picket M.D.   On: 12/18/2017 13:42   Nm Bone Scan Whole Body  Result Date: 12/15/2017 CLINICAL DATA:  Prostate cancer metastatic to bone EXAM: NUCLEAR MEDICINE WHOLE BODY BONE SCAN TECHNIQUE: Whole body anterior and posterior images were obtained approximately 3 hours after intravenous injection of radiopharmaceutical. RADIOPHARMACEUTICALS:  19.3 mCi Technetium-73mMDP IV COMPARISON:  09/28/2017 Radiographic correlation: CT abdomen and pelvis 10/06/2017 FINDINGS: Numerous sites of abnormal osseous tracer accumulation are identified consistent with widespread osseous metastatic disease. These include calvarium, proximal humeri, RIGHT scapula,  sternum, BILATERAL anterior and posterior ribs, thoracic spine, lumbar spine, pelvis, and BILATERAL femora greater on RIGHT. Compared to the previous exam, progressive uptake is seen within the sternum, ribs and pelvis as well as the RIGHT femur, with a single new site at the frontal calvarium. Expected urinary tract and soft tissue distribution of tracer. Photopenic defect at LEFT knee from prosthetic hardware. IMPRESSION: Mildly progressive osseous metastatic disease as above. Electronically Signed   By: MLavonia DanaM.D.   On: 12/15/2017 13:15   Ct Abdomen Pelvis W Contrast  Result Date: 12/18/2017 CLINICAL DATA:  Prostate cancer metastatic to liver and bones. Chemotherapy. Radiation therapy completed. EXAM: CT CHEST, ABDOMEN, AND PELVIS WITH CONTRAST TECHNIQUE: Multidetector CT imaging of the chest, abdomen and pelvis was performed following the standard protocol during bolus administration of intravenous contrast. CONTRAST:  1050mISOVUE-300 IOPAMIDOL (ISOVUE-300) INJECTION 61% COMPARISON:  Bone scan 12/15/2017, CT abdomen pelvis 10/06/2017 and CT chest abdomen pelvis 08/25/2017. FINDINGS: CT CHEST FINDINGS Cardiovascular: Left IJ Port-A-Cath has low-attenuation along the tubing, within the left internal jugular and brachiocephalic veins, with the tip terminating at the brachiocephalic vein junction. Atherosclerotic calcification of the arterial vasculature, including coronary arteries. Heart is at the upper limits of normal in size to mildly enlarged. No pericardial effusion. Mediastinum/Nodes: No pathologically enlarged mediastinal or hilar lymph nodes. There are new borderline enlarged right axillary/subpectoral lymph nodes, measuring up to 11 mm. No left axillary adenopathy. Fluid density structure in the left anterior scalene region measures 2 cm, as before. Esophagus is grossly unremarkable. Lungs/Pleura: 4 mm subpleural nodule along the minor fissure is unchanged. Mild scarring in the anterior right  lower lobe. No pleural fluid. Airway is unremarkable. Musculoskeletal: Mottled sclerosis is seen throughout the visualized osseous structures and appears mildly progressive. For example, there is increased sclerosis in the T12 vertebral body when compared with 10/06/2017. Old rib fractures. CT ABDOMEN PELVIS FINDINGS Hepatobiliary: There are enlarging hypoattenuating masses in both lobes of the liver. The largest is seen in segment 4, measuring 6.5 x 7.1 cm, with a central necrotic core, previously 5.1 x 5.5 cm on 10/06/2017. A stone is seen in the gallbladder, which appears somewhat distended. No biliary ductal dilatation. Pancreas: Negative. Spleen: Negative. Adrenals/Urinary Tract: Adrenal glands are unremarkable. Low-attenuation lesion in the interpolar right kidney measures 2.0 cm and is likely a cyst. A 1.5 cm lesion in the left kidney measures 17 Hounsfield units, too small to characterize. Subcentimeter low-attenuation lesion in the upper pole left kidney is likely a cyst. Ureters are decompressed. There may be ventral bladder wall thickening. Stomach/Bowel: Stomach, small bowel and appendix are unremarkable. Fair amount of stool is seen in the colon, indicative of constipation. Vascular/Lymphatic: Atherosclerotic calcification of the arterial vasculature without abdominal aortic aneurysm. Although a 7 mm low left periaortic lymph node (series 2, image 84) is not pathologically enlarged by CT size criteria, it is new  from 10/06/2017. Reproductive: Prostate is visualized. Other: Mild presacral edema, new or increased.  No free fluid. Musculoskeletal: Mottled sclerosis is seen throughout the visualized osseous structures. Bilateral L5 pars defects with grade 1 anterolisthesis, as before. IMPRESSION: 1. Enlarging liver metastases and slight worsening in osseous metastatic disease. 2. New borderline enlarged right axillary/subpectoral lymph nodes and a new subcentimeter left periaortic retroperitoneal lymph  node, findings worrisome for metastatic disease. 3. 1.5 cm lesion in the left kidney is difficult to characterize as a simple cyst. Continued attention on follow-up exams is warranted. 4. Aortic atherosclerosis (ICD10-170.0). Coronary artery calcification. 5. Cholelithiasis. Electronically Signed   By: Lorin Picket M.D.   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  FoundationOne testing revealed MSI-stable disease, TMB intermediate, KRAS G12D, and AKT3 amplification.  Treatment dates back to 10/04/2012 when he was started on Depo-Lupron + Casodex.  Due to progression of disease, he was switched to Zytiga + Prednisone in 10/2013, then Xtandi + Prednisone in 10/2014, then Docetaxel (11/23/2015- 72/07/3662) complicated by diarrhea requiring dose-reduction.  Eventually, he demonstrated biochemical failure of treatment with rising PSA leading to a change to Cabazitaxel (01/28/2016- 04/29/2017).  Due to progression of disease, he was re-challenged with Docetaxel beginning on 06/25/2017 with minimal biochemical response.  Additionally, he is receiving bone targeted therapy with monthly Xgeva.  He presents today to review restaging scans.    Labs today: CBC diff, CMET, PSA.  I personally reviewed and went over laboratory results with the patient.  The results are noted within this dictation.  Blood counts today demonstrated white blood cell count of 8.2 with normal differential, hemoglobin 7.8 g/dL, platelet count 164,000.  Metabolic panel demonstrates a minimal hypocalcemia with hypoalbuminemia.  Calcium is within normal limits.  PSA is in process.  His hemoglobin has dropped approximately 2 g/dL over the last 3-4 weeks.  Etiology is unclear but he does report gingival  bleeding.  I find it hard to believe that he can lose 2 g of blood in a short amount of time from gingival bleeding.  I think myelophthisis must be on our differential as well.  He has been offered packed red blood cell transfusion x2 and we will try to coordinate this with his treatment next week.  I personally reviewed and went over radiographic studies with the patient.  The results are noted within this dictation.  I personally reviewed the images in PACS.  Restaging CT CAP 12/18/2017 demonstrated enlarging liver metastases and slight worsening and osseous metastatic disease, new borderline enlarged right axillary/subpectoral lymph nodes and a new subcentimeter left periaortic retroperitoneal lymph node worrisome for progressive metastatic disease.  Radiographic findings in combination with poorly responding PSA value are concerning for failure of therapy.    Treatment options moving forward include best supportive care versus salvage Mitoxantrone + prednisone.  He has opted for ongoing treatment.  He is advised that this is his last treatment option.    Xgeva injection will be transition to every 6 weeks to coincide with treatment dates.  I have built Mitoxantrone + Prednisone treatment plan.  I do not have access to [facility] (I used to have access in the past).  We will plan on starting treatment next week.  He will require chemotherapy teaching for  this treatment plan.  I have discontinued his Docetaxel chemotherapy plan due to progression of disease.  Return next week to embark on Mitoxantrone + Prednisone salvage therapy.  We will continue with Depo-Lupron every 3 months and will transition Xgeva to every 6 weeks to coincide with treatment.   2. Encounter for antineoplastic chemotherapy Unfortunately, scans demonstrate progression of disease.  He will therefore require a change in therapy as described above.  3. Other acute pulmonary embolism without acute cor pulmonale (HCC) On  Xarelto anticoagulation.  4. Essential hypertension Blood pressure today is 147/77.  Systolic pressures above goal, but given his stage IV disease, I am not inclined to treat this aggressively.  Will defer management to primary care provider.  5. Diabetes mellitus without complication ([place]) On metformin therapy  6. Subdural hematoma (HCC) Will need to be mindful of this given that he is on anticoagulation for PE.  Xarelto compared to vitamin K antagonist therapy has decreased risk associated with intracranial bleeding.  7. Goals of care, counseling/discussion Ongoing.  He is educated that this is essentially his last treatment option and therefore goals of care discussion will have to be ongoing.  He knows that he has an incurable malignancy and treatment is palliative in nature.  8. Mouth sores Followed by dentist/oral surgeon.  He has follow-up in December 2019.  He may require follow-up sooner.  9. Gingival bleeding Thought to be from aforementioned sores.  Progressive since being on Xarelto.  Not sure if this is the sole cause of his progressive anemia.  He will pursue 2 units of packed red blood cells in the near future.   ORDERS PLACED FOR THIS ENCOUNTER: No orders of the defined types were placed in this encounter.   MEDICATIONS PRESCRIBED THIS ENCOUNTER: Meds ordered this encounter  Medications  . DISCONTD: Diphenhyd-Hydrocort-Nystatin (FIRST-DUKES MOUTHWASH) SUSP    Sig: Use as directed 15 mLs in the mouth or throat 4 (four) times daily as needed.    Dispense:  300 mL    Refill:  1    Order Specific Question:   Supervising Provider    Answer:   Derek Jack [[id number]]    THERAPY PLAN:  Change in therapy to mitoxantrone + prednisone.  I will transition Xgeva to every 6 weeks to coincide with treatment.  All questions were answered. The patient knows to call the clinic with any problems, questions or concerns. We can certainly see the patient much sooner if  necessary.  Patient and plan discussed with Dr. Derek Jack and she is in agreement with the aforementioned.   This note is electronically signed by: Robynn Pane, PA-C 12/22/2017 1:20 PM

## 2017-12-22 NOTE — Progress Notes (Signed)
Offered pt on behalf of T. Kefalas, PA-C to be transfused 2 units of blood this week, either Thursday or Friday.  He would like to wait and get blood, should he still need it, on the date of his next chemotherapy appt.  Okay per PA, but pt advised to report to the ED should he experience dizziness, lightheadedness, shortness of breath, chest pain, weakness.  He verbalizes understanding and agrees to do so should any of these occur.

## 2017-12-22 NOTE — Progress Notes (Signed)
Pharmacy called wanting permission to compound the dukes mouthwash .  Per Micheal Crigler, PA okay for pharmacy to mix.  I spoke with pharmacist at [facility] and gave verbal to compound.

## 2017-12-22 NOTE — Patient Instructions (Signed)
[place] at [facility] Discharge Instructions  Return next week to start treatment. Will refer you for chemotherapy teaching. Follow-up with Dentist/oral surgeon for oral bleeding. Return next week for treatment and follow-up.  Thank you for choosing [place] at [facility] to provide your oncology and hematology care.  To afford each patient quality time with our provider, please arrive at least 15 minutes before your scheduled appointment time.   If you have a lab appointment with the [facility] please come in thru the  Main Entrance and check in at the main information desk  You need to re-schedule your appointment should you arrive 10 or more minutes late.  We strive to give you quality time with our providers, and arriving late affects you and other patients whose appointments are after yours.  Also, if you no show three or more times for appointments you may be dismissed from the clinic at the providers discretion.     Again, thank you for choosing [facility].  Our hope is that these requests will decrease the amount of time that you wait before being seen by our physicians.       _____________________________________________________________  Should you have questions after your visit to [facility], please contact our office at [phone] between the hours of 8:00 a.m. and 4:30 p.m.  Voicemails left after 4:00 p.m. will not be returned until the following business day.  For prescription refill requests, have your pharmacy contact our office and allow 72 hours.    Cancer Center Support Programs:   > Cancer Support Group  2nd Tuesday of the month 1pm-2pm, Journey Room

## 2017-12-22 NOTE — Progress Notes (Signed)
Chemotherapy teaching pulled together. 

## 2017-12-22 NOTE — Progress Notes (Signed)
Treatment held today per Kirby Crigler PA-C

## 2017-12-23 ENCOUNTER — Other Ambulatory Visit (HOSPITAL_COMMUNITY): Payer: Self-pay | Admitting: *Deleted

## 2017-12-23 DIAGNOSIS — C61 Malignant neoplasm of prostate: Secondary | ICD-10-CM

## 2017-12-23 DIAGNOSIS — C7951 Secondary malignant neoplasm of bone: Principal | ICD-10-CM

## 2017-12-24 ENCOUNTER — Ambulatory Visit (HOSPITAL_COMMUNITY)
Admission: RE | Admit: 2017-12-24 | Discharge: 2017-12-24 | Disposition: A | Discharge status: Home / Self Care | Payer: Medicare Other | Source: Ambulatory Visit | Attending: Hematology | Admitting: Hematology

## 2017-12-24 DIAGNOSIS — C61 Malignant neoplasm of prostate: Secondary | ICD-10-CM | POA: Diagnosis not present

## 2017-12-24 DIAGNOSIS — I081 Rheumatic disorders of both mitral and tricuspid valves: Secondary | ICD-10-CM | POA: Insufficient documentation

## 2017-12-24 DIAGNOSIS — E119 Type 2 diabetes mellitus without complications: Secondary | ICD-10-CM | POA: Diagnosis not present

## 2017-12-24 DIAGNOSIS — I10 Essential (primary) hypertension: Secondary | ICD-10-CM | POA: Diagnosis not present

## 2017-12-24 DIAGNOSIS — C7951 Secondary malignant neoplasm of bone: Secondary | ICD-10-CM | POA: Insufficient documentation

## 2017-12-24 MED ORDER — PREDNISONE 5 MG PO TABS: 5.0000 mg | ORAL_TABLET | Freq: Two times a day (BID) | ORAL | 4 refills | Status: AC

## 2017-12-24 NOTE — Progress Notes (Signed)
*  PRELIMINARY RESULTS* Echocardiogram 2D Echocardiogram has been performed.  Micheal Davis 12/24/2017, 10:54 AM

## 2017-12-25 ENCOUNTER — Telehealth (HOSPITAL_COMMUNITY): Payer: Self-pay | Admitting: Hematology

## 2017-12-25 NOTE — Telephone Encounter (Signed)
PT has questions regarding bill for $59.80. Reviewed pts account and could not figure out why we are billing him for zofran. Will refer him to billing dept.

## 2017-12-28 ENCOUNTER — Encounter (HOSPITAL_COMMUNITY): Payer: Self-pay | Admitting: Hematology

## 2017-12-28 ENCOUNTER — Inpatient Hospital Stay (HOSPITAL_COMMUNITY): Payer: Medicare Other

## 2017-12-28 ENCOUNTER — Inpatient Hospital Stay (HOSPITAL_BASED_OUTPATIENT_CLINIC_OR_DEPARTMENT_OTHER): Payer: Medicare Other | Admitting: Hematology

## 2017-12-28 ENCOUNTER — Other Ambulatory Visit (HOSPITAL_COMMUNITY): Payer: Self-pay

## 2017-12-28 ENCOUNTER — Other Ambulatory Visit: Payer: Self-pay

## 2017-12-28 ENCOUNTER — Telehealth (HOSPITAL_COMMUNITY): Payer: Self-pay | Admitting: *Deleted

## 2017-12-28 VITALS — BP 124/66 | HR 52 | Temp 97.9°F | Resp 16

## 2017-12-28 VITALS — BP 147/82 | HR 72 | Temp 97.6°F | Resp 18 | Wt 213.0 lb

## 2017-12-28 DIAGNOSIS — E1140 Type 2 diabetes mellitus with diabetic neuropathy, unspecified: Secondary | ICD-10-CM | POA: Diagnosis not present

## 2017-12-28 DIAGNOSIS — C7951 Secondary malignant neoplasm of bone: Principal | ICD-10-CM

## 2017-12-28 DIAGNOSIS — C61 Malignant neoplasm of prostate: Secondary | ICD-10-CM

## 2017-12-28 DIAGNOSIS — C787 Secondary malignant neoplasm of liver and intrahepatic bile duct: Secondary | ICD-10-CM | POA: Diagnosis not present

## 2017-12-28 DIAGNOSIS — K1379 Other lesions of oral mucosa: Secondary | ICD-10-CM

## 2017-12-28 DIAGNOSIS — Z5111 Encounter for antineoplastic chemotherapy: Secondary | ICD-10-CM | POA: Diagnosis not present

## 2017-12-28 DIAGNOSIS — E079 Disorder of thyroid, unspecified: Secondary | ICD-10-CM

## 2017-12-28 DIAGNOSIS — I10 Essential (primary) hypertension: Secondary | ICD-10-CM | POA: Diagnosis not present

## 2017-12-28 DIAGNOSIS — D649 Anemia, unspecified: Secondary | ICD-10-CM | POA: Diagnosis not present

## 2017-12-28 DIAGNOSIS — D6481 Anemia due to antineoplastic chemotherapy: Secondary | ICD-10-CM | POA: Diagnosis not present

## 2017-12-28 LAB — CBC WITH DIFFERENTIAL/PLATELET
Abs Immature Granulocytes: 0.12 10*3/uL — ABNORMAL HIGH (ref 0.00–0.07)
BASOS ABS: 0 10*3/uL (ref 0.0–0.1)
BASOS PCT: 0 %
EOS PCT: 2 %
Eosinophils Absolute: 0.1 10*3/uL (ref 0.0–0.5)
HCT: 27.3 % — ABNORMAL LOW (ref 39.0–52.0)
Hemoglobin: 8 g/dL — ABNORMAL LOW (ref 13.0–17.0)
Immature Granulocytes: 2 %
Lymphocytes Relative: 11 %
Lymphs Abs: 0.7 10*3/uL (ref 0.7–4.0)
MCH: 28.1 pg (ref 26.0–34.0)
MCHC: 29.3 g/dL — AB (ref 30.0–36.0)
MCV: 95.8 fL (ref 80.0–100.0)
MONO ABS: 0.4 10*3/uL (ref 0.1–1.0)
Monocytes Relative: 7 %
NRBC: 0 % (ref 0.0–0.2)
Neutro Abs: 5 10*3/uL (ref 1.7–7.7)
Neutrophils Relative %: 78 %
PLATELETS: 235 10*3/uL (ref 150–400)
RBC: 2.85 MIL/uL — ABNORMAL LOW (ref 4.22–5.81)
RDW: 19.1 % — AB (ref 11.5–15.5)
WBC: 6.3 10*3/uL (ref 4.0–10.5)

## 2017-12-28 LAB — COMPREHENSIVE METABOLIC PANEL
ALT: 17 U/L (ref 0–44)
ANION GAP: 10 (ref 5–15)
AST: 31 U/L (ref 15–41)
Albumin: 3 g/dL — ABNORMAL LOW (ref 3.5–5.0)
Alkaline Phosphatase: 177 U/L — ABNORMAL HIGH (ref 38–126)
BUN: 15 mg/dL (ref 8–23)
CHLORIDE: 102 mmol/L (ref 98–111)
CO2: 25 mmol/L (ref 22–32)
Calcium: 8.2 mg/dL — ABNORMAL LOW (ref 8.9–10.3)
Creatinine, Ser: 0.79 mg/dL (ref 0.61–1.24)
GFR calc non Af Amer: >60 mL/min (ref >60)
Glucose, Bld: 184 mg/dL — ABNORMAL HIGH (ref 70–99)
POTASSIUM: 4.2 mmol/L (ref 3.5–5.1)
SODIUM: 137 mmol/L (ref 135–145)
Total Bilirubin: 0.8 mg/dL (ref 0.3–1.2)
Total Protein: 6.6 g/dL (ref 6.5–8.1)

## 2017-12-28 LAB — PREPARE RBC (CROSSMATCH)

## 2017-12-28 LAB — SAMPLE TO BLOOD BANK

## 2017-12-28 LAB — ABO/RH: ABO/RH(D): A POS

## 2017-12-28 MED ORDER — PEGFILGRASTIM 6 MG/0.6ML ~~LOC~~ PSKT
PREFILLED_SYRINGE | SUBCUTANEOUS | Status: AC
  Filled 2017-12-28: qty 0.6

## 2017-12-28 MED ORDER — SODIUM CHLORIDE 0.9% IV SOLUTION
250.0000 mL | Freq: Once | INTRAVENOUS | Status: AC
  Administered 2017-12-28: 250 mL via INTRAVENOUS

## 2017-12-28 MED ORDER — SODIUM CHLORIDE 0.9 % IV SOLN
Freq: Once | INTRAVENOUS | Status: AC
  Administered 2017-12-28: 8 mg via INTRAVENOUS
  Filled 2017-12-28: qty 4

## 2017-12-28 MED ORDER — DIPHENHYDRAMINE HCL 25 MG PO CAPS
25.0000 mg | ORAL_CAPSULE | Freq: Once | ORAL | Status: AC
  Administered 2017-12-28: 25 mg via ORAL
  Filled 2017-12-28: qty 1

## 2017-12-28 MED ORDER — SODIUM CHLORIDE 0.9 % IV SOLN: 8.0000 mg | Freq: Once | INTRAVENOUS | Status: DC

## 2017-12-28 MED ORDER — SODIUM CHLORIDE 0.9 % IV SOLN
12.0000 mg/m2 | Freq: Once | INTRAVENOUS | Status: AC
  Administered 2017-12-28: 26 mg via INTRAVENOUS
  Filled 2017-12-28: qty 13

## 2017-12-28 MED ORDER — FIRST-DUKES MOUTHWASH MT SUSP: 15.0000 mL | Freq: Four times a day (QID) | OROMUCOSAL | 1 refills | Status: DC | PRN

## 2017-12-28 MED ORDER — SODIUM CHLORIDE 0.9% FLUSH
10.0000 mL | INTRAVENOUS | Status: DC | PRN
  Administered 2017-12-28: 10 mL
  Filled 2017-12-28: qty 10

## 2017-12-28 MED ORDER — PEGFILGRASTIM 6 MG/0.6ML ~~LOC~~ PSKT
6.0000 mg | PREFILLED_SYRINGE | Freq: Once | SUBCUTANEOUS | Status: AC
  Administered 2017-12-28: 6 mg via SUBCUTANEOUS

## 2017-12-28 MED ORDER — ACETAMINOPHEN 325 MG PO TABS
650.0000 mg | ORAL_TABLET | Freq: Once | ORAL | Status: AC
  Administered 2017-12-28: 650 mg via ORAL
  Filled 2017-12-28: qty 2

## 2017-12-28 MED ORDER — SODIUM CHLORIDE 0.9 % IV SOLN: 10.0000 mg | Freq: Once | INTRAVENOUS | Status: DC

## 2017-12-28 MED ORDER — DENOSUMAB 120 MG/1.7ML ~~LOC~~ SOLN
120.0000 mg | Freq: Once | SUBCUTANEOUS | Status: AC
  Administered 2017-12-28: 120 mg via SUBCUTANEOUS
  Filled 2017-12-28: qty 1.7

## 2017-12-28 MED ORDER — SODIUM CHLORIDE 0.9 % IV SOLN
Freq: Once | INTRAVENOUS | Status: AC
  Administered 2017-12-28: 11:00:00 via INTRAVENOUS

## 2017-12-28 MED ORDER — HEPARIN SOD (PORK) LOCK FLUSH 100 UNIT/ML IV SOLN
500.0000 [IU] | Freq: Once | INTRAVENOUS | Status: AC | PRN
  Administered 2017-12-28: 500 [IU]

## 2017-12-28 NOTE — Telephone Encounter (Signed)
Error

## 2017-12-28 NOTE — Progress Notes (Signed)
[address], [place] [address]   CLINIC:  Medical Oncology/Hematology  PCP:  [name], [place] [id number] [phone]   REASON FOR VISIT: Chemotherapy for metastatic prostate cancer.  CURRENT THERAPY: Mitoxantrone every 3 weeks.  BRIEF ONCOLOGIC HISTORY:    Prostate cancer metastatic to bone ([place])   10/01/2012 Initial Diagnosis    Prostate biopsied with highest Gleason score of 9 seen and the lowest score was 7.    10/04/2012 - 05/16/2013 Chemotherapy    Depo-Lupron and Casodex initiated    05/16/2013 -  Chemotherapy    Depo-Lupron monthly continued    05/16/2013 Progression    Progression by PSA elevation    05/16/2013 - 10/22/2014 Chemotherapy    Abiraterone and prednisone initiated in conjunction with ongoing Depo-Lupron.  Denosumab also ongoing.    10/23/2014 Progression    PSA increasing from 0.2- 1.6 in less than 6 months.      10/23/2014 - 01/30/2015 Chemotherapy    Enzalutamide and Prednisone (5 mg in AM and 2.5 mg in PM)    01/30/2015 Imaging    Bone scan- New focus of intense activity in right proximal humerus.  Interim increase in activity over left hip.    01/30/2015 Progression    Bone scan reveals new disease in right humerus consistent with progression of disease    01/31/2015 Imaging    Right humerus xray- blastic foci in proximal right humeral metaphysis and over right mid-humeral diaphysis.  No evidence of fracture    07/06/2015 Progression    Progression in multiple bones especially L hip and femurs    07/06/2015 Imaging    Bone scan- progressive multifocal osseous metastases in the right proximal femora and distal femoral shafts.  Stable update in bilateral ribs suspicious for small rib metastases    07/16/2015 - 07/31/2015 Radiation Therapy    Left femur 30 Gy in 10 fractions by Dr. Tammi Klippel    11/23/2015 - 01/04/2016 Chemotherapy    The patient had pegfilgrastim (NEULASTA ONPRO KIT) injection 6  mg, 6 mg, Subcutaneous, Once, 3 of 7 cycles  DOCEtaxel (TAXOTERE) 180 mg in dextrose 5 % 250 mL chemo infusion, 75 mg/m2 = 180 mg, Intravenous,  Once, 3 of 7 cycles Dose modification: 64 mg/m2 (original dose 75 mg/m2, Cycle 2, Reason: Dose not tolerated)  pegfilgrastim (NEULASTA ONPRO KIT) injection 6 mg, 6 mg, Subcutaneous, Once, 0 of 4 cycles  cabazitaxel (JEVTANA) 60 mg in dextrose 5 % 250 mL chemo infusion, 25 mg/m2, Intravenous,  Once, 0 of 4 cycles  for chemotherapy treatment.      11/30/2015 Adverse Reaction    Diarrhea (secondary to chemotherapy) and dehydration requiring IV fluids    12/14/2015 Treatment Plan Change    Docetaxel dose reduced by 15%    12/31/2015 Procedure    Port placed by Dr. Arnoldo Morale    01/04/2016 Progression    Rising PSA    01/28/2016 - 04/29/2017 Chemotherapy    Cabazitaxel (Jevtana)     03/27/2016 Imaging    CT Chest, Abdomen, and Pelvis with contrast 1. Diffuse sclerotic osseous metastatic disease in the chest, abdomen, and pelvis without acute fracture identified. 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 These appear chronic. 2. The prostate gland is normal in size and no adenopathy is currently identified. 3. On a prior MRI of 10/04/2012, there was a posterior right hepatic lobe lesion. This lesion is not currently visible on  today' s CT. This could be due to differences in cons acuity between CT or MRI, or resolution of the lesion. 4. Coronary, aortic arch, and branch vessel atherosclerotic vascular disease. Aortoiliac atherosclerotic vascular disease. 5. Single bilateral renal cysts.    04/16/2016 Imaging    Bone scan- Findings consist with progressive metastatic disease. Activity over the proximal right humerus and proximal bilateral femurs are worrisome for the possible development of pathologic fractures.    08/21/2016 Imaging    CT C/A/P: IMPRESSION: No significant change since 03/27/2016 CT. Diffuse  bony metastases without other evidence of metastatic disease.    08/21/2016 Imaging    Bone Scan: IMPRESSION: Multiple areas of increased activity again noted throughout the axial and appendicular skeleton in similar locations as prior exam. Intensity of uptake is increased from prior exam suggesting progressive disease. Lesions present in the proximal humeri, proximal femurs, and the mid right femur susceptible to pathologic fracture.    12/02/2016 Imaging    CT C/A/P: IMPRESSION: 1. Overall stable appearance of diffuse osseous metastatic disease and resulting patchy sclerosis. 2. The patient had a posterior right hepatic lobe lesion on prior MRI from 2014 which is been relatively occult on CT. Given the lack of progression I suspect that this is benign or has been effectively treated. 3. Aortic Atherosclerosis (ICD10-I70.0). Coronary atherosclerosis with mild cardiomegaly. 4. Cholelithiasis. 5. Bilateral benign renal cysts. 6. 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    12/02/2016 Imaging    Bone Scan: IMPRESSION: 1. Widespread osseous metastatic disease. Multiplicity is similar to previous exam with interval increase in degree of tracer uptake associated with multiple lesions.    03/11/2017 Imaging    Bone Scan: Multifocal skeletal disease without sign of progression CT C/A/P: Diffuse sclerotic skeletal lesions, unchanged from the previous.  No new lymphadenopathy, pulmonary nodules, or hepatic lesions to suggest soft tissue progressive disease    06/25/2017 -  Chemotherapy    Docetaxel    12/22/2017 -  Chemotherapy    The patient had pegfilgrastim (NEULASTA ONPRO KIT) injection 6 mg, 6 mg, Subcutaneous, Once, 1 of 6 cycles ondansetron (ZOFRAN) 8 mg in sodium chloride 0.9 % 50 mL IVPB, 8 mg (100 % of  original dose 8 mg), Intravenous,  Once, 1 of 1 cycle Dose modification: 8 mg (original dose 8 mg, Cycle 1) ondansetron (ZOFRAN) 8 mg, dexamethasone (DECADRON) 10 mg in sodium chloride 0.9 % 50 mL IVPB, , Intravenous,  Once, 1 of 6 cycles mitoXANtrone (NOVANTRONE) 26 mg in sodium chloride 0.9 % 50 mL chemo infusion, 12 mg/m2 = 26 mg, Intravenous,  Once, 1 of 6 cycles  for chemotherapy treatment.       CANCER STAGING: Cancer Staging Prostate cancer metastatic to bone [facility]) Staging form: Prostate, AJCC 7th Edition - Clinical: No stage assigned - Unsigned    INTERVAL HISTORY:  Micheal Davis 72 y.o. male returns for chemotherapy for metastatic prostate cancer.  He complains of fatigue.  His numbness in the feet has been stable.  His oral ulcers have gotten better with Magic mouthwash.  His bleeding also improved.  No other bleeding was reported.  His pain is well controlled with extended release morphine twice a day.  He is requiring 1 to 2 pills of immediate release morphine for breakthrough pain.  Denies any fevers or infections.   REVIEW OF SYSTEMS:  Review of Systems  Constitutional: Positive for fatigue. Negative for chills.       Bleeding gums  HENT:   Positive for mouth sores.   Cardiovascular: Positive for leg swelling.  Gastrointestinal: Positive for constipation.  Neurological: Positive for dizziness and numbness. Negative for extremity weakness.  Hematological: Bruises/bleeds easily.  All other systems reviewed and are negative.    PAST MEDICAL/SURGICAL HISTORY:  Past Medical History:  Diagnosis Date  . Diabetes mellitus without complication ([place])   . Hypertension   . Prostate cancer ([place]) 09/05/2015  . Prostate cancer metastatic to bone ([place]) 09/05/2015  . Sleep apnea   . Thyroid disease    Past Surgical History:  Procedure Laterality Date  . CATARACT EXTRACTION    . PORTACATH PLACEMENT Left 12/31/2015   Procedure: INSERTION PORT-A-CATH LEFT SUBCLAVIAN;  Surgeon:  Aviva Signs, MD;  Location: [facility];  Service: General;  Laterality: Left;  . REPLACEMENT TOTAL KNEE Left      SOCIAL HISTORY:  Social History   Socioeconomic History  . Marital status: Married    Spouse name: Not on file  . Number of children: Not on file  . Years of education: Not on file  . Highest education level: Not on file  Occupational History  . Not on file  Social Needs  . Financial resource strain: Not on file  . Food insecurity:    Worry: Not on file    Inability: Not on file  . Transportation needs:    Medical: Not on file    Non-medical: Not on file  Tobacco Use  . Smoking status: Never Smoker  . Smokeless tobacco: Never Used  Substance and Sexual Activity  . Alcohol use: Yes    Comment: 1 beer each month  . Drug use: No  . Sexual activity: Never    Comment: married  Lifestyle  . Physical activity:    Days per week: Not on file    Minutes per session: Not on file  . Stress: Not on file  Relationships  . Social connections:    Talks on phone: Not on file    Gets together: Not on file    Attends religious service: Not on file    Active member of club or organization: Not on file    Attends meetings of clubs or organizations: Not on file    Relationship status: Not on file  . Intimate partner violence:    Fear of current or ex partner: Not on file    Emotionally abused: Not on file    Physically abused: Not on file    Forced sexual activity: Not on file  Other Topics Concern  . Not on file  Social History Narrative  . Not on file    FAMILY HISTORY:  Family History  Problem Relation Age of Onset  . Renal Disease Father   . Vascular Disease Father        Carotid artery disease  . Hypertension Brother     CURRENT MEDICATIONS:  Outpatient Encounter Medications as of 12/28/2017  Medication Sig Note  . atenolol (TENORMIN) 100 MG tablet Take 100 mg by mouth daily.   Marland Kitchen atorvastatin (LIPITOR) 20 MG tablet Take 10 mg by mouth every morning.    .  calcium carbonate (OS-CAL - DOSED IN MG OF ELEMENTAL CALCIUM) 1250 (500 Ca) MG tablet Take 1 tablet by mouth 2 (two) times daily.    Marland Kitchen diltiazem (CARDIZEM CD) 300 MG 24 hr capsule Take 300 mg by mouth  daily.   . Diphenhyd-Hydrocort-Nystatin (FIRST-DUKES MOUTHWASH) SUSP Use as directed 15 mLs in the mouth or throat 4 (four) times daily as needed.   . gabapentin (NEURONTIN) 300 MG capsule Patient may take 1 capsule in the morning and 2 capsules @ bedtime.   . Glucosamine-Chondroit-Vit C-Mn (GLUCOSAMINE 1500 COMPLEX PO) Take 1 tablet by mouth 2 (two) times daily.    Marland Kitchen leuprolide (LUPRON) 7.5 MG injection Inject 7.5 mg into the muscle every 28 (twenty-eight) days.   Marland Kitchen levothyroxine (SYNTHROID, LEVOTHROID) 25 MCG tablet Take 25 mcg by mouth daily before breakfast.   . loperamide (IMODIUM) 1 MG/5ML solution Take 1 mg by mouth as needed for diarrhea or loose stools.   . Lysine 500 MG CAPS Take 500 mg by mouth daily. 10/06/2017: NOT CONSISTENT   . metFORMIN (GLUCOPHAGE) 500 MG tablet Take 500 mg by mouth 2 (two) times daily with a meal.   . MITOXANTRONE HCL IV Inject into the vein every 21 ( twenty-one) days.   Marland Kitchen morphine (MS CONTIN) 60 MG 12 hr tablet Take 1 tablet (60 mg total) by mouth every 12 (twelve) hours.   Marland Kitchen morphine (MSIR) 30 MG tablet Take 1 tablet (30 mg total) by mouth every 6 (six) hours as needed for severe pain.   . Multiple Vitamin (MULTIVITAMIN WITH MINERALS) TABS tablet Take 1 tablet by mouth daily.   . Omega-3 Fatty Acids (FISH OIL PO) Take 1 capsule by mouth daily.   . ondansetron (ZOFRAN-ODT) 8 MG disintegrating tablet Take 1 tablet (8 mg total) by mouth every 8 (eight) hours as needed for nausea or vomiting.   . potassium chloride SA (KLOR-CON M20) 20 MEQ tablet TAKE 20 MEQ BY MOUTH ONCE DAILY   . predniSONE (DELTASONE) 5 MG tablet Take 5 mg by mouth daily.    . predniSONE (DELTASONE) 5 MG tablet Take 1 tablet (5 mg total) by mouth 2 (two) times daily.   . rivaroxaban (XARELTO) 20 MG  TABS tablet Take 1 tablet (20 mg total) by mouth daily.   . tamsulosin (FLOMAX) 0.4 MG CAPS capsule Take 0.8 mg by mouth every morning.    . triamterene-hydrochlorothiazide (MAXZIDE-25) 37.5-25 MG tablet Take 1 tablet by mouth daily. for high blood pressure   . [DISCONTINUED] prochlorperazine (COMPAZINE) 10 MG tablet Take 1 tablet (10 mg total) by mouth every 6 (six) hours as needed (Nausea or vomiting). (Patient taking differently: Take 10 mg by mouth every 6 (six) hours as needed for nausea or vomiting. )    Facility-Administered Encounter Medications as of 12/28/2017  Medication  . [COMPLETED] 0.9 %  sodium chloride infusion  . [COMPLETED] heparin lock flush 100 unit/mL  . leuprolide (LUPRON) injection 7.5 mg  . [COMPLETED] mitoXANtrone (NOVANTRONE) 26 mg in sodium chloride 0.9 % 50 mL chemo infusion  . [COMPLETED] ondansetron (ZOFRAN) 8 mg, dexamethasone (DECADRON) 10 mg in sodium chloride 0.9 % 50 mL IVPB  . [COMPLETED] pegfilgrastim (NEULASTA ONPRO KIT) injection 6 mg  . sodium chloride flush (NS) 0.9 % injection 10 mL  . [DISCONTINUED] dexamethasone (DECADRON) 10 mg in sodium chloride 0.9 % 50 mL IVPB  . [DISCONTINUED] ondansetron (ZOFRAN) 8 mg in sodium chloride 0.9 % 50 mL IVPB    ALLERGIES:  No Known Allergies   PHYSICAL EXAM:  ECOG Performance status: 1  Vitals:   12/28/17 0916  BP: (!) 147/82  Pulse: 72  Resp: 18  Temp: 97.6 F (36.4 C)  SpO2: 98%   Filed Weights   12/28/17 0916  Weight: 212 lb 15.4 oz (96.6 kg)    Physical Exam  Constitutional: He is oriented to person, place, and time. He appears well-developed and well-nourished.  Cardiovascular: Normal rate, regular rhythm and normal heart sounds.  Pulmonary/Chest: Effort normal and breath sounds normal.  Musculoskeletal: Normal range of motion.  Neurological: He is alert and oriented to person, place, and time.  Skin: Skin is warm and dry.  Psychiatric: He has a normal mood and affect. His behavior is  normal. Judgment and thought content normal.  Extremity's: 1+ edema.   LABORATORY DATA:  I have reviewed the labs as listed.  CBC    Component Value Date/Time   WBC 6.3 12/28/2017 0838   RBC 2.85 (L) 12/28/2017 0838   HGB 8.0 (L) 12/28/2017 0838   HCT 27.3 (L) 12/28/2017 0838   PLT 235 12/28/2017 0838   MCV 95.8 12/28/2017 0838   MCH 28.1 12/28/2017 0838   MCHC 29.3 (L) 12/28/2017 0838   RDW 19.1 (H) 12/28/2017 0838   LYMPHSABS 0.7 12/28/2017 0838   MONOABS 0.4 12/28/2017 0838   EOSABS 0.1 12/28/2017 0838   BASOSABS 0.0 12/28/2017 0838   CMP Latest Ref Rng & Units 12/28/2017 12/22/2017 11/30/2017  Glucose 70 - 99 mg/dL 184(H) 188(H) 189(H)  BUN 8 - 23 mg/dL '15 11 12  ' Creatinine 0.61 - 1.24 mg/dL 0.79 0.73 0.78  Sodium 135 - 145 mmol/L 137 134(L) 135  Potassium 3.5 - 5.1 mmol/L 4.2 4.0 3.9  Chloride 98 - 111 mmol/L 102 100 98  CO2 22 - 32 mmol/L '25 23 24  ' Calcium 8.9 - 10.3 mg/dL 8.2(L) 8.7(L) 8.5(L)  Total Protein 6.5 - 8.1 g/dL 6.6 6.6 7.1  Total Bilirubin 0.3 - 1.2 mg/dL 0.8 0.8 0.9  Alkaline Phos 38 - 126 U/L 177(H) 172(H) 199(H)  AST 15 - 41 U/L 31 28 33  ALT 0 - 44 U/L '17 23 19     ' Radiology: I have independently reviewed images of the CT scan and bone scan dated 12/18/2017 and discussed with patient.   ASSESSMENT & PLAN:   Prostate cancer metastatic to bone ([place]) 1.  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 - BRCA 1/2 and other mutations negative ([facility]), PDL 1 expression of 0% -Foundation 1 testing shows MS-stable, TMB intermediate, KRAS G12D, AKT3 amplification, no other significant alterations to modify therapy. -Most recent progression on cabazitaxel.  He has used [place] in the past.  He had received 2 cycles of docetaxel in the beginning.  It is unclear whether this was discontinued secondary to progression of disease.   -Cycle 1 of docetaxel at 20% dose reduction started  on 06/25/2017.  He received cycle 2 on 07/16/2017.  Cycle 3 was on 08/06/2017. -We have reviewed the results of the CT scan and bone scan.  CT scan dated 08/25/2017 was compared to scans done on 05/06/2017.  There was addition of 2 new small lesions in the liver.  The dominant lesion appears bigger due to necrosis.  As there was 6 to 8 weeks delay in starting his first cycle of chemotherapy from the scans, these lesions could have grown during that time.  However his PSA has come down from 196 to161 at cycle 3.  He received cycle 4 on 08/27/2017.  Last treatment was cycle 5 on 09/17/2017. - We discussed the results of the CT scan of the abdomen and pelvis dated 10/06/2017 which showed similar appearance of scattered liver  metastatic lesions.  Although unchanged in size, dominant lesion in segment 4 of the liver does demonstrate some increased central necrosis compared to previous.  Diffuse skeletal metastasis are stable. - Completed cycle 8 on 11/30/2017. -A CT scan on 12/18/2017 showed enlarging liver mets.  Bone scan also showed worsening bone mets.  PSA on 12/22/2017 increased to 319. -2D echocardiogram on 12/24/2017 shows ejection fraction of 55 to 60%. - Treatment change to mitoxantrone was recommended.  He will continue prednisone 5 mg twice daily.  We talked about side effects of mitoxantrone in detail.  We plan to repeat scans after 2-3 cycles.  Will come back in 3 weeks for follow-up and next cycle.  2.  Peripheral neuropathy: He has constant numbness in the feet even prior to start of chemotherapy.  He takes gabapentin 1 tablet in the morning and 2 tablets at bedtime.  The numbness has not gotten any worse.  He has occasional numbness in the left hand.  This is new.  3.  Chemotherapy-induced anemia: -He received Feraheme second infusion on 09/17/2017.  Hemoglobin today is 8.  We will transfuse him 1 unit.  4.  Cancer related pain: -He is taking morphine ER 60 mg every 12 hours. -He uses morphine IR 30 mg  1 to 2 tablets per day for breakthrough pain.  5.  Incidental pulmonary embolism: - Diagnosed on a CT scan of the abdomen and pelvis on 10/06/2017 done for follow-up of prostate cancer, showing right lower lobe pulmonary embolus.  CT evidence of right heart strain.  He was hospitalized, started on IV heparin, transition to Xarelto. -Tolerating Xarelto very well.  Occasional gum bleeding due to poor dentition.  6.  Bone metastasis: - He will continue denosumab every 4 weeks.       Orders placed this encounter:  Orders Placed This Encounter  Procedures  . Comprehensive metabolic panel  . CBC with Differential  . SCHEDULING COMMUNICATION  . TREATMENT CONDITIONS      Micheal Davis, [place] [phone]

## 2017-12-28 NOTE — Assessment & Plan Note (Signed)
1.  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 - BRCA 1/2 and other mutations negative ([facility]riad), PDL 1 expression of 0% -Foundation 1 testing shows MS-stable, TMB intermediate, KRAS G12D, AKT3 amplification, no other significant alterations to modify therapy. -Most recent progression on cabazitaxel.  He has used [place] in the past.  He had received 2 cycles of docetaxel in the beginning.  It is unclear whether this was discontinued secondary to progression of disease.   -Cycle 1 of docetaxel at 20% dose reduction started on 06/25/2017.  He received cycle 2 on 07/16/2017.  Cycle 3 was on 08/06/2017. -We have reviewed the results of the CT scan and bone scan.  CT scan dated 08/25/2017 was compared to scans done on 05/06/2017.  There was addition of 2 new small lesions in the liver.  The dominant lesion appears bigger due to necrosis.  As there was 6 to 8 weeks delay in starting his first cycle of chemotherapy from the scans, these lesions could have grown during that time.  However his PSA has come down from 196 to161 at cycle 3.  He received cycle 4 on 08/27/2017.  Last treatment was cycle 5 on 09/17/2017. - We discussed the results of the CT scan of the abdomen and pelvis dated 10/06/2017 which showed similar appearance of scattered liver metastatic lesions.  Although unchanged in size, dominant lesion in segment 4 of the liver does demonstrate some increased central necrosis compared to previous.  Diffuse skeletal metastasis are stable. - Completed cycle 8 on 11/30/2017. -A CT scan on 12/18/2017 showed enlarging liver mets.  Bone scan also showed worsening bone mets.  PSA on 12/22/2017 increased to 319. -2D echocardiogram on 12/24/2017 shows ejection fraction of 55 to 60%. - Treatment change to mitoxantrone was recommended.  He will continue prednisone 5 mg twice daily.  We talked about side effects of mitoxantrone in detail.  We plan  to repeat scans after 2-3 cycles.  Will come back in 3 weeks for follow-up and next cycle.  2.  Peripheral neuropathy: He has constant numbness in the feet even prior to start of chemotherapy.  He takes gabapentin 1 tablet in the morning and 2 tablets at bedtime.  The numbness has not gotten any worse.  He has occasional numbness in the left hand.  This is new.  3.  Chemotherapy-induced anemia: -He received Feraheme second infusion on 09/17/2017.  Hemoglobin today is 8.  We will transfuse him 1 unit.  4.  Cancer related pain: -He is taking morphine ER 60 mg every 12 hours. -He uses morphine IR 30 mg 1 to 2 tablets per day for breakthrough pain.  5.  Incidental pulmonary embolism: - Diagnosed on a CT scan of the abdomen and pelvis on 10/06/2017 done for follow-up of prostate cancer, showing right lower lobe pulmonary embolus.  CT evidence of right heart strain.  He was hospitalized, started on IV heparin, transition to Xarelto. -Tolerating Xarelto very well.  Occasional gum bleeding due to poor dentition.  6.  Bone metastasis: - He will continue denosumab every 4 weeks.

## 2017-12-28 NOTE — Progress Notes (Signed)
Pt seen today by Dr. [name]. VSS. Per MD may proceed with treatment. VO received to give 1 unit of blood today after treatment.   New Chemo treatment taught. Consent obtained. Blood consent obtained for 1 unit. Folder given with new chemo treatment. Mitoxantrone.

## 2017-12-29 ENCOUNTER — Telehealth (HOSPITAL_COMMUNITY): Payer: Self-pay

## 2017-12-29 LAB — TYPE AND SCREEN
ABO/RH(D): A POS
ANTIBODY SCREEN: NEGATIVE
Unit division: 0

## 2017-12-29 LAB — BPAM RBC
BLOOD PRODUCT EXPIRATION DATE: 201911052359
ISSUE DATE / TIME: 201910281316
UNIT TYPE AND RH: 6200

## 2017-12-29 NOTE — Progress Notes (Signed)
Treatment given per orders. Patient tolerated it well without problems. Vitals stable and discharged home from clinic ambulatory. Follow up as scheduled.  Marland KitchenDenyce Davis arrived [facility] neulasta on body injector. See MAR for administration details. Injector in place and engaged with green light indicator on flashing. Tolerated application with out problems.

## 2017-12-29 NOTE — Telephone Encounter (Signed)
24 hour follow up -patient stated he is doing ok today, some bone pain but he is used to that. He has taken loratidine to help that. No other issues at this time.

## 2017-12-29 NOTE — Patient Instructions (Signed)
[facility] Discharge Instructions for Patients Receiving Chemotherapy   Beginning January 23rd 2017 lab work for the Cancer Center will be done in the  Main lab at [place] on 1st floor. If you have a lab appointment with the Cancer Center please come in thru the  Main Entrance and check in at the main information desk   Today you received the following chemotherapy agents   To help prevent nausea and vomiting after your treatment, we encourage you to take your nausea medication     If you develop nausea and vomiting, or diarrhea that is not controlled by your medication, call the clinic.  The clinic phone number is [phone]. Office hours are Monday-Friday 8:30am-5:00pm.  BELOW ARE SYMPTOMS THAT SHOULD BE REPORTED IMMEDIATELY:  *FEVER GREATER THAN 101.0 F  *CHILLS WITH OR WITHOUT FEVER  NAUSEA AND VOMITING THAT IS NOT CONTROLLED WITH YOUR NAUSEA MEDICATION  *UNUSUAL SHORTNESS OF BREATH  *UNUSUAL BRUISING OR BLEEDING  TENDERNESS IN MOUTH AND THROAT WITH OR WITHOUT PRESENCE OF ULCERS  *URINARY PROBLEMS  *BOWEL PROBLEMS  UNUSUAL RASH Items with * indicate a potential emergency and should be followed up as soon as possible. If you have an emergency after office hours please contact your primary care physician or go to the nearest emergency department.  Please call the clinic during office hours if you have any questions or concerns.   You may also contact the Patient Navigator at [phone] should you have any questions or need assistance in obtaining follow up care.      Resources For Cancer Patients and their Caregivers ? American Cancer Society: Can assist with transportation, wigs, general needs, runs Look Good Feel Better.        1-888-227-6333 ? Cancer Care: Provides financial assistance, online support groups, medication/co-pay assistance.  1-800-813-HOPE (4673) ? [facility] Assists [place] cancer  patients and their families through emotional , educational and financial support.  336-427-4357 ? [place] DSS Where to apply for food stamps, Medicaid and utility assistance. 336-342-1394 ? [facility]: Transportation to medical appointments. 336-347-2287 ? Social Security Administration: May apply for disability if have a Stage IV cancer. 336-342-7796 1-800-772-1213 ? [place] Aging, Disability and Transit Services: Assists with nutrition, care and transit needs. 336-349-2343         

## 2018-01-18 ENCOUNTER — Other Ambulatory Visit: Payer: Self-pay

## 2018-01-18 ENCOUNTER — Other Ambulatory Visit (HOSPITAL_COMMUNITY): Payer: Self-pay | Admitting: *Deleted

## 2018-01-18 ENCOUNTER — Inpatient Hospital Stay (HOSPITAL_COMMUNITY): Payer: Medicare Other | Attending: Hematology

## 2018-01-18 ENCOUNTER — Encounter (HOSPITAL_COMMUNITY): Payer: Self-pay | Admitting: Hematology

## 2018-01-18 ENCOUNTER — Encounter (HOSPITAL_COMMUNITY): Payer: Self-pay

## 2018-01-18 ENCOUNTER — Inpatient Hospital Stay (HOSPITAL_COMMUNITY): Payer: Medicare Other

## 2018-01-18 ENCOUNTER — Inpatient Hospital Stay (HOSPITAL_BASED_OUTPATIENT_CLINIC_OR_DEPARTMENT_OTHER): Payer: Medicare Other | Admitting: Hematology

## 2018-01-18 VITALS — BP 99/50 | HR 61 | Temp 98.0°F | Resp 18 | Wt 205.0 lb

## 2018-01-18 DIAGNOSIS — D6481 Anemia due to antineoplastic chemotherapy: Secondary | ICD-10-CM

## 2018-01-18 DIAGNOSIS — G893 Neoplasm related pain (acute) (chronic): Secondary | ICD-10-CM | POA: Diagnosis not present

## 2018-01-18 DIAGNOSIS — E079 Disorder of thyroid, unspecified: Secondary | ICD-10-CM

## 2018-01-18 DIAGNOSIS — C787 Secondary malignant neoplasm of liver and intrahepatic bile duct: Secondary | ICD-10-CM | POA: Diagnosis not present

## 2018-01-18 DIAGNOSIS — C61 Malignant neoplasm of prostate: Secondary | ICD-10-CM

## 2018-01-18 DIAGNOSIS — I10 Essential (primary) hypertension: Secondary | ICD-10-CM

## 2018-01-18 DIAGNOSIS — Z79899 Other long term (current) drug therapy: Secondary | ICD-10-CM | POA: Insufficient documentation

## 2018-01-18 DIAGNOSIS — Z923 Personal history of irradiation: Secondary | ICD-10-CM

## 2018-01-18 DIAGNOSIS — I2510 Atherosclerotic heart disease of native coronary artery without angina pectoris: Secondary | ICD-10-CM | POA: Insufficient documentation

## 2018-01-18 DIAGNOSIS — C7951 Secondary malignant neoplasm of bone: Principal | ICD-10-CM

## 2018-01-18 DIAGNOSIS — G629 Polyneuropathy, unspecified: Secondary | ICD-10-CM | POA: Diagnosis not present

## 2018-01-18 DIAGNOSIS — E1140 Type 2 diabetes mellitus with diabetic neuropathy, unspecified: Secondary | ICD-10-CM | POA: Diagnosis not present

## 2018-01-18 DIAGNOSIS — I2699 Other pulmonary embolism without acute cor pulmonale: Secondary | ICD-10-CM

## 2018-01-18 DIAGNOSIS — Z7901 Long term (current) use of anticoagulants: Secondary | ICD-10-CM

## 2018-01-18 DIAGNOSIS — Z9221 Personal history of antineoplastic chemotherapy: Secondary | ICD-10-CM | POA: Diagnosis not present

## 2018-01-18 DIAGNOSIS — Z5111 Encounter for antineoplastic chemotherapy: Secondary | ICD-10-CM | POA: Diagnosis not present

## 2018-01-18 LAB — COMPREHENSIVE METABOLIC PANEL
ALK PHOS: 210 U/L — AB (ref 38–126)
ALT: 23 U/L (ref 0–44)
AST: 34 U/L (ref 15–41)
Albumin: 2.7 g/dL — ABNORMAL LOW (ref 3.5–5.0)
Anion gap: 11 (ref 5–15)
BILIRUBIN TOTAL: 0.9 mg/dL (ref 0.3–1.2)
BUN: 12 mg/dL (ref 8–23)
CALCIUM: 8.3 mg/dL — AB (ref 8.9–10.3)
CO2: 25 mmol/L (ref 22–32)
CREATININE: 0.79 mg/dL (ref 0.61–1.24)
Chloride: 97 mmol/L — ABNORMAL LOW (ref 98–111)
GFR calc Af Amer: >60 mL/min (ref >60)
Glucose, Bld: 210 mg/dL — ABNORMAL HIGH (ref 70–99)
Potassium: 3.9 mmol/L (ref 3.5–5.1)
SODIUM: 133 mmol/L — AB (ref 135–145)
Total Protein: 6.8 g/dL (ref 6.5–8.1)

## 2018-01-18 LAB — CBC WITH DIFFERENTIAL/PLATELET
Abs Immature Granulocytes: 0.05 10*3/uL (ref 0.00–0.07)
Basophils Absolute: 0 10*3/uL (ref 0.0–0.1)
Basophils Relative: 1 %
EOS ABS: 0 10*3/uL (ref 0.0–0.5)
EOS PCT: 0 %
HEMATOCRIT: 29.4 % — AB (ref 39.0–52.0)
Hemoglobin: 8.9 g/dL — ABNORMAL LOW (ref 13.0–17.0)
Immature Granulocytes: 1 %
Lymphocytes Relative: 10 %
Lymphs Abs: 0.7 10*3/uL (ref 0.7–4.0)
MCH: 27.6 pg (ref 26.0–34.0)
MCHC: 30.3 g/dL (ref 30.0–36.0)
MCV: 91 fL (ref 80.0–100.0)
MONO ABS: 0.6 10*3/uL (ref 0.1–1.0)
Monocytes Relative: 9 %
Neutro Abs: 5.3 10*3/uL (ref 1.7–7.7)
Neutrophils Relative %: 79 %
Platelets: 301 10*3/uL (ref 150–400)
RBC: 3.23 MIL/uL — AB (ref 4.22–5.81)
RDW: 17.7 % — ABNORMAL HIGH (ref 11.5–15.5)
WBC: 6.6 10*3/uL (ref 4.0–10.5)
nRBC: 0 % (ref 0.0–0.2)

## 2018-01-18 LAB — SAMPLE TO BLOOD BANK

## 2018-01-18 MED ORDER — MORPHINE SULFATE 30 MG PO TABS: 30.0000 mg | ORAL_TABLET | Freq: Four times a day (QID) | ORAL | 0 refills | Status: AC | PRN

## 2018-01-18 MED ORDER — RIVAROXABAN 20 MG PO TABS: 10.0000 mg | ORAL_TABLET | Freq: Every day | ORAL | 2 refills | Status: DC

## 2018-01-18 MED ORDER — SODIUM CHLORIDE 0.9 % IV SOLN
Freq: Once | INTRAVENOUS | Status: AC
  Administered 2018-01-18: 10:00:00 via INTRAVENOUS

## 2018-01-18 MED ORDER — HEPARIN SOD (PORK) LOCK FLUSH 100 UNIT/ML IV SOLN
500.0000 [IU] | Freq: Once | INTRAVENOUS | Status: AC | PRN
  Administered 2018-01-18: 500 [IU]

## 2018-01-18 MED ORDER — SODIUM CHLORIDE 0.9% FLUSH
10.0000 mL | INTRAVENOUS | Status: DC | PRN
  Administered 2018-01-18: 10 mL
  Filled 2018-01-18: qty 10

## 2018-01-18 MED ORDER — SODIUM CHLORIDE 0.9 % IV SOLN
12.0000 mg/m2 | Freq: Once | INTRAVENOUS | Status: AC
  Administered 2018-01-18: 26 mg via INTRAVENOUS
  Filled 2018-01-18: qty 13

## 2018-01-18 MED ORDER — MORPHINE SULFATE ER 60 MG PO TBCR: 60.0000 mg | EXTENDED_RELEASE_TABLET | Freq: Two times a day (BID) | ORAL | 0 refills | Status: DC

## 2018-01-18 MED ORDER — PEGFILGRASTIM 6 MG/0.6ML ~~LOC~~ PSKT
6.0000 mg | PREFILLED_SYRINGE | Freq: Once | SUBCUTANEOUS | Status: AC
  Administered 2018-01-18: 6 mg via SUBCUTANEOUS
  Filled 2018-01-18: qty 0.6

## 2018-01-18 MED ORDER — AMOXICILLIN 500 MG PO CAPS: 500.0000 mg | ORAL_CAPSULE | Freq: Three times a day (TID) | ORAL | 0 refills | Status: DC

## 2018-01-18 MED ORDER — SODIUM CHLORIDE 0.9 % IV SOLN
Freq: Once | INTRAVENOUS | Status: AC
  Administered 2018-01-18: 8 mg via INTRAVENOUS
  Filled 2018-01-18: qty 4

## 2018-01-18 NOTE — Progress Notes (Signed)
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    10/04/2012 - 05/16/2013 Chemotherapy    Depo-Lupron and Casodex initiated    05/16/2013 -  Chemotherapy    Depo-Lupron monthly continued    05/16/2013 Progression    Progression by PSA elevation    05/16/2013 - 10/22/2014 Chemotherapy    Abiraterone and prednisone initiated in conjunction with ongoing Depo-Lupron.  Denosumab also ongoing.    10/23/2014 Progression    PSA increasing from 0.2- 1.6 in less than 6 months.      10/23/2014 - 01/30/2015 Chemotherapy    Enzalutamide and Prednisone (5 mg in AM and 2.5 mg in PM)    01/30/2015 Imaging    Bone scan- New focus of intense activity in right proximal humerus.  Interim increase in activity over left hip.    01/30/2015 Progression    Bone scan reveals new disease in right humerus consistent with progression of disease    01/31/2015 Imaging    Right humerus xray- blastic foci in proximal right humeral metaphysis and over right mid-humeral diaphysis.  No evidence of fracture    07/06/2015 Progression    Progression in multiple bones especially L hip and femurs    07/06/2015 Imaging    Bone scan- progressive multifocal osseous metastases in the right proximal femora and distal femoral shafts.  Stable update in bilateral ribs suspicious for small rib metastases    07/16/2015 - 07/31/2015 Radiation Therapy    Left femur 30 Gy in 10 fractions by Dr. Tammi Klippel    11/23/2015 - 01/04/2016 Chemotherapy    The patient had pegfilgrastim (NEULASTA ONPRO KIT)  injection 6 mg, 6 mg, Subcutaneous, Once, 3 of 7 cycles  DOCEtaxel (TAXOTERE) 180 mg in dextrose 5 % 250 mL chemo infusion, 75 mg/m2 = 180 mg, Intravenous,  Once, 3 of 7 cycles Dose modification: 64 mg/m2 (original dose 75 mg/m2, Cycle 2, Reason: Dose not tolerated)  pegfilgrastim (NEULASTA ONPRO KIT) injection 6 mg, 6 mg, Subcutaneous, Once, 0 of 4 cycles  cabazitaxel (JEVTANA) 60 mg in dextrose 5 % 250 mL chemo infusion, 25 mg/m2, Intravenous,  Once, 0 of 4 cycles  for chemotherapy treatment.      11/30/2015 Adverse Reaction    Diarrhea (secondary to chemotherapy) and dehydration requiring IV fluids    12/14/2015 Treatment Plan Change    Docetaxel dose reduced by 15%    12/31/2015 Procedure    Port placed by Dr. Arnoldo Morale    01/04/2016 Progression    Rising PSA    01/28/2016 - 04/29/2017 Chemotherapy    Cabazitaxel (Jevtana)     03/27/2016 Imaging    CT Chest, Abdomen, and Pelvis with contrast 1. Diffuse sclerotic osseous metastatic disease in the chest, abdomen, and pelvis without acute fracture identified. 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 These appear chronic. 2. The prostate gland is normal in size and no adenopathy is currently identified. 3. On a prior MRI of 10/04/2012, there was a posterior right hepatic lobe lesion. This lesion is not  currently visible on today' s CT. This could be due to differences in cons acuity between CT or MRI, or resolution of the lesion. 4. Coronary, aortic arch, and branch vessel atherosclerotic vascular disease. Aortoiliac atherosclerotic vascular disease. 5. Single bilateral renal cysts.    04/16/2016 Imaging    Bone scan- Findings consist with progressive metastatic disease. Activity over the proximal right humerus and proximal bilateral femurs are worrisome for the possible development of pathologic fractures.    08/21/2016 Imaging    CT C/A/P: IMPRESSION: No significant change since 03/27/2016  CT. Diffuse bony metastases without other evidence of metastatic disease.    08/21/2016 Imaging    Bone Scan: IMPRESSION: Multiple areas of increased activity again noted throughout the axial and appendicular skeleton in similar locations as prior exam. Intensity of uptake is increased from prior exam suggesting progressive disease. Lesions present in the proximal humeri, proximal femurs, and the mid right femur susceptible to pathologic fracture.    12/02/2016 Imaging    CT C/A/P: IMPRESSION: 1. Overall stable appearance of diffuse osseous metastatic disease and resulting patchy sclerosis. 2. The patient had a posterior right hepatic lobe lesion on prior MRI from 2014 which is been relatively occult on CT. Given the lack of progression I suspect that this is benign or has been effectively treated. 3. Aortic Atherosclerosis (ICD10-I70.0). Coronary atherosclerosis with mild cardiomegaly. 4. Cholelithiasis. 5. Bilateral benign renal cysts. 6. 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    12/02/2016 Imaging    Bone Scan: IMPRESSION: 1. Widespread osseous metastatic disease. Multiplicity is similar to previous exam with interval increase in degree of tracer uptake associated with multiple lesions.    03/11/2017 Imaging    Bone Scan: Multifocal skeletal disease without sign of progression CT C/A/P: Diffuse sclerotic skeletal lesions, unchanged from the previous.  No new lymphadenopathy, pulmonary nodules, or hepatic lesions to suggest soft tissue progressive disease    06/25/2017 -  Chemotherapy    Docetaxel    12/22/2017 -  Chemotherapy    The patient had pegfilgrastim (NEULASTA ONPRO KIT) injection 6 mg, 6 mg, Subcutaneous, Once, 2 of 6 cycles Administration: 6 mg (12/28/2017), 6 mg (01/18/2018) ondansetron  (ZOFRAN) 8 mg in sodium chloride 0.9 % 50 mL IVPB, 8 mg (100 % of original dose 8 mg), Intravenous,  Once, 1 of 1 cycle Dose modification: 8 mg (original dose 8 mg, Cycle 1) ondansetron (ZOFRAN) 8 mg, dexamethasone (DECADRON) 10 mg in sodium chloride 0.9 % 50 mL IVPB, , Intravenous,  Once, 2 of 6 cycles Administration: 8 mg (12/28/2017), 8 mg (01/18/2018) mitoXANtrone (NOVANTRONE) 26 mg in sodium chloride 0.9 % 50 mL chemo infusion, 12 mg/m2 = 26 mg, Intravenous,  Once, 2 of 6 cycles Administration: 26 mg (12/28/2017), 26 mg (01/18/2018)  for chemotherapy treatment.       CANCER STAGING: Cancer Staging Prostate cancer metastatic to bone [facility]) Staging form: Prostate, AJCC 7th Edition - Clinical: No stage assigned - Unsigned    INTERVAL HISTORY:  Mr. Crewe 72 y.o. 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 He is here today with right sided rib and back pain. The pain is not allowing him to move around as much and he is fatigued throughout the day. He is SOB with exertion and has dizziness when  standing. He has multiple mouth ulcers that are  painful and has decreased his ability to eat well. He has easy bruising and bleeding due to the blood thinners and is asking to decrease the dosage. He denies any nausea, vomiting, or diarrhea. Denies any fevers or chills. Denies any problems with urination. He reports his appetite at 50% and his energy level at 25%. He is maintaining his weight at this time and will start drinking ensure when he is unable to eat.    REVIEW OF SYSTEMS:  Review of Systems  Constitutional: Positive for fatigue.  HENT:   Positive for mouth sores.   Respiratory: Positive for shortness of breath.   Cardiovascular: Positive for leg swelling.  Musculoskeletal: Positive for back pain.  Neurological: Positive for dizziness, extremity weakness and numbness.  Hematological: Bruises/bleeds easily.  All other systems reviewed and are  negative.    PAST MEDICAL/SURGICAL HISTORY:  Past Medical History:  Diagnosis Date  . Diabetes mellitus without complication ([place])   . Hypertension   . Prostate cancer ([place]) 09/05/2015  . Prostate cancer metastatic to bone ([place]) 09/05/2015  . Sleep apnea   . Thyroid disease    Past Surgical History:  Procedure Laterality Date  . CATARACT EXTRACTION    . PORTACATH PLACEMENT Left 12/31/2015   Procedure: INSERTION PORT-A-CATH LEFT SUBCLAVIAN;  Surgeon: Aviva Signs, MD;  Location: [facility];  Service: General;  Laterality: Left;  . REPLACEMENT TOTAL KNEE Left      SOCIAL HISTORY:  Social History   Socioeconomic History  . Marital status: Married    Spouse name: Not on file  . Number of children: Not on file  . Years of education: Not on file  . Highest education level: Not on file  Occupational History  . Not on file  Social Needs  . Financial resource strain: Not on file  . Food insecurity:    Worry: Not on file    Inability: Not on file  . Transportation needs:    Medical: Not on file    Non-medical: Not on file  Tobacco Use  . Smoking status: Never Smoker  . Smokeless tobacco: Never Used  Substance and Sexual Activity  . Alcohol use: Yes    Comment: 1 beer each month  . Drug use: No  . Sexual activity: Never    Comment: married  Lifestyle  . Physical activity:    Days per week: Not on file    Minutes per session: Not on file  . Stress: Not on file  Relationships  . Social connections:    Talks on phone: Not on file    Gets together: Not on file    Attends religious service: Not on file    Active member of club or organization: Not on file    Attends meetings of clubs or organizations: Not on file    Relationship status: Not on file  . Intimate partner violence:    Fear of current or ex partner: Not on file    Emotionally abused: Not on file    Physically abused: Not on file    Forced sexual activity: Not on file  Other Topics Concern  . Not on file    Social History Narrative  . Not on file    FAMILY HISTORY:  Family History  Problem Relation Age of Onset  . Renal Disease Father   . Vascular Disease Father        Carotid artery disease  . Hypertension Brother     CURRENT MEDICATIONS:  Outpatient Encounter Medications as  of 01/18/2018  Medication Sig Note  . amoxicillin (AMOXIL) 500 MG capsule Take 1 capsule (500 mg total) by mouth 3 (three) times daily.   Marland Kitchen atenolol (TENORMIN) 100 MG tablet Take 100 mg by mouth daily.   Marland Kitchen atorvastatin (LIPITOR) 20 MG tablet Take 10 mg by mouth every morning.    . calcium carbonate (OS-CAL - DOSED IN MG OF ELEMENTAL CALCIUM) 1250 (500 Ca) MG tablet Take 1 tablet by mouth 2 (two) times daily.    Marland Kitchen diltiazem (CARDIZEM CD) 300 MG 24 hr capsule Take 300 mg by mouth daily.   . Diphenhyd-Hydrocort-Nystatin (FIRST-DUKES MOUTHWASH) SUSP Use as directed 15 mLs in the mouth or throat 4 (four) times daily as needed.   . gabapentin (NEURONTIN) 300 MG capsule Patient may take 1 capsule in the morning and 2 capsules @ bedtime.   . Glucosamine-Chondroit-Vit C-Mn (GLUCOSAMINE 1500 COMPLEX PO) Take 1 tablet by mouth 2 (two) times daily.    Marland Kitchen leuprolide (LUPRON) 7.5 MG injection Inject 7.5 mg into the muscle every 28 (twenty-eight) days.   Marland Kitchen levothyroxine (SYNTHROID, LEVOTHROID) 25 MCG tablet Take 25 mcg by mouth daily before breakfast.   . loperamide (IMODIUM) 1 MG/5ML solution Take 1 mg by mouth as needed for diarrhea or loose stools.   . Lysine 500 MG CAPS Take 500 mg by mouth daily. 10/06/2017: NOT CONSISTENT   . metFORMIN (GLUCOPHAGE) 500 MG tablet Take 500 mg by mouth 2 (two) times daily with a meal.   . MITOXANTRONE HCL IV Inject into the vein every 21 ( twenty-one) days.   Marland Kitchen morphine (MS CONTIN) 60 MG 12 hr tablet Take 1 tablet (60 mg total) by mouth every 12 (twelve) hours.   Marland Kitchen morphine (MSIR) 30 MG tablet Take 1 tablet (30 mg total) by mouth every 6 (six) hours as needed for severe pain.   . Multiple  Vitamin (MULTIVITAMIN WITH MINERALS) TABS tablet Take 1 tablet by mouth daily.   . Omega-3 Fatty Acids (FISH OIL PO) Take 1 capsule by mouth daily.   . ondansetron (ZOFRAN-ODT) 8 MG disintegrating tablet Take 1 tablet (8 mg total) by mouth every 8 (eight) hours as needed for nausea or vomiting.   . potassium chloride SA (KLOR-CON M20) 20 MEQ tablet TAKE 20 MEQ BY MOUTH ONCE DAILY   . predniSONE (DELTASONE) 5 MG tablet Take 5 mg by mouth daily.    . predniSONE (DELTASONE) 5 MG tablet Take 1 tablet (5 mg total) by mouth 2 (two) times daily.   . rivaroxaban (XARELTO) 20 MG TABS tablet Take 1 tablet (20 mg total) by mouth daily.   . tamsulosin (FLOMAX) 0.4 MG CAPS capsule Take 0.8 mg by mouth every morning.    . triamterene-hydrochlorothiazide (MAXZIDE-25) 37.5-25 MG tablet Take 1 tablet by mouth daily. for high blood pressure   . [DISCONTINUED] morphine (MS CONTIN) 60 MG 12 hr tablet Take 1 tablet (60 mg total) by mouth every 12 (twelve) hours.   . [DISCONTINUED] morphine (MSIR) 30 MG tablet Take 1 tablet (30 mg total) by mouth every 6 (six) hours as needed for severe pain.   . [DISCONTINUED] prochlorperazine (COMPAZINE) 10 MG tablet Take 1 tablet (10 mg total) by mouth every 6 (six) hours as needed (Nausea or vomiting). (Patient taking differently: Take 10 mg by mouth every 6 (six) hours as needed for nausea or vomiting. )   . [DISCONTINUED] rivaroxaban (XARELTO) 20 MG TABS tablet Take 1 tablet (20 mg total) by mouth daily.    Facility-Administered  Encounter Medications as of 01/18/2018  Medication  . [COMPLETED] 0.9 %  sodium chloride infusion  . [COMPLETED] heparin lock flush 100 unit/mL  . leuprolide (LUPRON) injection 7.5 mg  . [COMPLETED] mitoXANtrone (NOVANTRONE) 26 mg in sodium chloride 0.9 % 50 mL chemo infusion  . [COMPLETED] ondansetron (ZOFRAN) 8 mg, dexamethasone (DECADRON) 10 mg in sodium chloride 0.9 % 50 mL IVPB  . [COMPLETED] pegfilgrastim (NEULASTA ONPRO KIT) injection 6 mg  .  sodium chloride flush (NS) 0.9 % injection 10 mL    ALLERGIES:  No Known Allergies   PHYSICAL EXAM:  ECOG Performance status: 1  VITAL SIGN: BP: 116/59, P:73, R:18, T:97.9, O2:100% Weight: 150.8  Physical Exam  Constitutional: He is oriented to person, place, and time. He appears well-developed and well-nourished.  HENT:  Multiple mouth ulcers.  Cardiovascular: Normal rate, regular rhythm and normal heart sounds.  Pulmonary/Chest: Effort normal and breath sounds normal.  Musculoskeletal: Normal range of motion.  Neurological: He is alert and oriented to person, place, and time.  Skin: Skin is warm and dry.  Psychiatric: He has a normal mood and affect. His behavior is normal. Judgment and thought content normal.     LABORATORY DATA:  I have reviewed the labs as listed.  CBC    Component Value Date/Time   WBC 6.6 01/18/2018 0815   RBC 3.23 (L) 01/18/2018 0815   HGB 8.9 (L) 01/18/2018 0815   HCT 29.4 (L) 01/18/2018 0815   PLT 301 01/18/2018 0815   MCV 91.0 01/18/2018 0815   MCH 27.6 01/18/2018 0815   MCHC 30.3 01/18/2018 0815   RDW 17.7 (H) 01/18/2018 0815   LYMPHSABS 0.7 01/18/2018 0815   MONOABS 0.6 01/18/2018 0815   EOSABS 0.0 01/18/2018 0815   BASOSABS 0.0 01/18/2018 0815   CMP Latest Ref Rng & Units 01/18/2018 12/28/2017 12/22/2017  Glucose 70 - 99 mg/dL 210(H) 184(H) 188(H)  BUN 8 - 23 mg/dL '12 15 11  ' Creatinine 0.61 - 1.24 mg/dL 0.79 0.79 0.73  Sodium 135 - 145 mmol/L 133(L) 137 134(L)  Potassium 3.5 - 5.1 mmol/L 3.9 4.2 4.0  Chloride 98 - 111 mmol/L 97(L) 102 100  CO2 22 - 32 mmol/L '25 25 23  ' Calcium 8.9 - 10.3 mg/dL 8.3(L) 8.2(L) 8.7(L)  Total Protein 6.5 - 8.1 g/dL 6.8 6.6 6.6  Total Bilirubin 0.3 - 1.2 mg/dL 0.9 0.8 0.8  Alkaline Phos 38 - 126 U/L 210(H) 177(H) 172(H)  AST 15 - 41 U/L 34 31 28  ALT 0 - 44 U/L '23 17 23        ' ASSESSMENT & PLAN:   Prostate cancer metastatic to bone ([place]) 1.  Metastatic prostate cancer to the bones and liver:  Liver biopsy consistent with adenocarcinoma, prostatic primary. - BRCA 1/2 and other mutations negative ([facility]), PDL 1 expression of 0% -Foundation 1 testing shows MS-stable, TMB intermediate, KRAS G12D, AKT3 amplification, no other significant alterations to modify therapy. -Most recent progression on cabazitaxel.  He has used [place] in the past.  He had received 2 cycles of docetaxel in the beginning.  It is unclear whether this was discontinued secondary to progression of disease.   -Cycle 1 of docetaxel at 20% dose reduction started on 06/25/2017.  He received cycle 2 on 07/16/2017.  Cycle 3 was on 08/06/2017. -We have reviewed the results of the CT scan and bone scan.  CT scan dated 08/25/2017 was compared to scans done on 05/06/2017.  There was addition of 2 new small  lesions in the liver.  The dominant lesion appears bigger due to necrosis.  As there was 6 to 8 weeks delay in starting his first cycle of chemotherapy from the scans, these lesions could have grown during that time.  However his PSA has come down from 196 to161 at cycle 3.  He received cycle 4 on 08/27/2017.  Last treatment was cycle 5 on 09/17/2017. - We discussed the results of the CT scan of the abdomen and pelvis dated 10/06/2017 which showed similar appearance of scattered liver metastatic lesions.  Although unchanged in size, dominant lesion in segment 4 of the liver does demonstrate some increased central necrosis compared to previous.  Diffuse skeletal metastasis are stable. - Completed cycle 8 on 11/30/2017. -A CT scan on 12/18/2017 showed enlarging liver mets.  Bone scan also showed worsening bone mets.  PSA on 12/22/2017 increased to 319. -2D echocardiogram on 12/24/2017 shows ejection fraction of 55 to 60%. - Cycle 1 mitoxantrone 12 mg/m on 12/28/2017 -I have reviewed his blood counts.  He may proceed with cycle 2 at the same dose level.  2.  Peripheral neuropathy: He has constant numbness in the feet even prior to  start of chemotherapy.  He takes gabapentin 1 tablet in the morning and 2 tablets at bedtime.  The numbness has not gotten any worse.  He has occasional numbness in the left hand.  This is new.  3.  Chemotherapy-induced anemia: -He received Feraheme second infusion on 09/17/2017.  Hemoglobin today is 8.  We will transfuse him 1 unit.  4.  Cancer related pain: -He reports pain in his ribs on the right side, more in the last 2 weeks. -He is taking morphine ER 60 mg every 12 hours. -He is taking morphine IR 30 mg 3-4 times a day.    5.  Incidental pulmonary embolism: - Diagnosed on a CT scan of the abdomen and pelvis on 10/06/2017 done for follow-up of prostate cancer, showing right lower lobe pulmonary embolus.  CT evidence of right heart strain. -He is currently on Xarelto 20 mg daily.  He complains of gum bleeding multiple times during the day.  As he had received 3 months of full dose anticoagulation, I will cut back on Xarelto to 10 mg daily.  6.  Bone metastasis: - He will continue denosumab every 4 weeks.       Orders placed this encounter:  Orders Placed This Encounter  Procedures  . Magnesium  . CBC with Differential/Platelet  . Comprehensive metabolic panel  . PSA      Derek Jack, MD [place] [phone]

## 2018-01-18 NOTE — Assessment & Plan Note (Signed)
1.  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 - BRCA 1/2 and other mutations negative ([employer]), PDL 1 expression of 0% -Foundation 1 testing shows MS-stable, TMB intermediate, KRAS G12D, AKT3 amplification, no other significant alterations to modify therapy. -Most recent progression on cabazitaxel.  He has used [place] in the past.  He had received 2 cycles of docetaxel in the beginning.  It is unclear whether this was discontinued secondary to progression of disease.   -Cycle 1 of docetaxel at 20% dose reduction started on 06/25/2017.  He received cycle 2 on 07/16/2017.  Cycle 3 was on 08/06/2017. -We have reviewed the results of the CT scan and bone scan.  CT scan dated 08/25/2017 was compared to scans done on 05/06/2017.  There was addition of 2 new small lesions in the liver.  The dominant lesion appears bigger due to necrosis.  As there was 6 to 8 weeks delay in starting his first cycle of chemotherapy from the scans, these lesions could have grown during that time.  However his PSA has come down from 196 to161 at cycle 3.  He received cycle 4 on 08/27/2017.  Last treatment was cycle 5 on 09/17/2017. - We discussed the results of the CT scan of the abdomen and pelvis dated 10/06/2017 which showed similar appearance of scattered liver metastatic lesions.  Although unchanged in size, dominant lesion in segment 4 of the liver does demonstrate some increased central necrosis compared to previous.  Diffuse skeletal metastasis are stable. - Completed cycle 8 on 11/30/2017. -A CT scan on 12/18/2017 showed enlarging liver mets.  Bone scan also showed worsening bone mets.  PSA on 12/22/2017 increased to 319. -2D echocardiogram on 12/24/2017 shows ejection fraction of 55 to 60%. - Cycle 1 mitoxantrone 12 mg/m on 12/28/2017 -I have reviewed his blood counts.  He may proceed with cycle 2 at the same dose level.  2.  Peripheral neuropathy:  He has constant numbness in the feet even prior to start of chemotherapy.  He takes gabapentin 1 tablet in the morning and 2 tablets at bedtime.  The numbness has not gotten any worse.  He has occasional numbness in the left hand.  This is new.  3.  Chemotherapy-induced anemia: -He received Feraheme second infusion on 09/17/2017.  Hemoglobin today is 8.  We will transfuse him 1 unit.  4.  Cancer related pain: -He reports pain in his ribs on the right side, more in the last 2 weeks. -He is taking morphine ER 60 mg every 12 hours. -He is taking morphine IR 30 mg 3-4 times a day.    5.  Incidental pulmonary embolism: - Diagnosed on a CT scan of the abdomen and pelvis on 10/06/2017 done for follow-up of prostate cancer, showing right lower lobe pulmonary embolus.  CT evidence of right heart strain. -He is currently on Xarelto 20 mg daily.  He complains of gum bleeding multiple times during the day.  As he had received 3 months of full dose anticoagulation, I will cut back on Xarelto to 10 mg daily.  6.  Bone metastasis: - He will continue denosumab every 4 weeks.

## 2018-01-18 NOTE — Progress Notes (Signed)
Pt presents today for Novantrone q 21 day treatment. VSS. Pt requests refill on pain medication, and antibiotics for an abscess on his gum. Last weight 212 and today 205. Pt states his appetite is decreased due to the abscess on his gum.   Pt seen by Dr. [name]. Proceed with treatment.    Neulasta onpro placed on pt's R arm.   Treatment given today per MD orders. Tolerated infusion without adverse affects. Vital signs stable. No complaints at this time. Discharged from clinic ambulatory. F/U with [facility] as scheduled.

## 2018-01-18 NOTE — Patient Instructions (Addendum)
[place] [facility] Discharge Instructions   We will see you back in 3 weeks with labs and treatment.   Thank you for choosing [place] at [facility] to provide your oncology and hematology care.  To afford each patient quality time with our provider, please arrive at least 15 minutes before your scheduled appointment time.   If you have a lab appointment with the [place] please come in thru the  Main Entrance and check in at the main information desk  You need to re-schedule your appointment should you arrive 10 or more minutes late.  We strive to give you quality time with our providers, and arriving late affects you and other patients whose appointments are after yours.  Also, if you no show three or more times for appointments you may be dismissed from the clinic at the providers discretion.     Again, thank you for choosing [facility].  Our hope is that these requests will decrease the amount of time that you wait before being seen by our physicians.       _____________________________________________________________  Should you have questions after your visit to [facility], please contact our office at [phone] between the hours of 8:00 a.m. and 4:30 p.m.  Voicemails left after 4:00 p.m. will not be returned until the following business day.  For prescription refill requests, have your pharmacy contact our office and allow 72 hours.    Cancer Center Support Programs:   > Cancer Support Group  2nd Tuesday of the month 1pm-2pm, Journey Room

## 2018-01-18 NOTE — Patient Instructions (Signed)
[facility] Discharge Instructions for Patients Receiving Chemotherapy  Today you received the following chemotherapy agents   To help prevent nausea and vomiting after your treatment, we encourage you to take your nausea medication   If you develop nausea and vomiting that is not controlled by your nausea medication, call the clinic.   BELOW ARE SYMPTOMS THAT SHOULD BE REPORTED IMMEDIATELY:  *FEVER GREATER THAN 100.5 F  *CHILLS WITH OR WITHOUT FEVER  NAUSEA AND VOMITING THAT IS NOT CONTROLLED WITH YOUR NAUSEA MEDICATION  *UNUSUAL SHORTNESS OF BREATH  *UNUSUAL BRUISING OR BLEEDING  TENDERNESS IN MOUTH AND THROAT WITH OR WITHOUT PRESENCE OF ULCERS  *URINARY PROBLEMS  *BOWEL PROBLEMS  UNUSUAL RASH Items with * indicate a potential emergency and should be followed up as soon as possible.  Feel free to call the clinic should you have any questions or concerns. The clinic phone number is [phone].  Please show the CHEMO ALERT CARD at check-in to the Emergency Department and triage nurse.   

## 2018-01-19 ENCOUNTER — Other Ambulatory Visit (HOSPITAL_COMMUNITY): Payer: Self-pay | Admitting: Nurse Practitioner

## 2018-01-19 DIAGNOSIS — C61 Malignant neoplasm of prostate: Secondary | ICD-10-CM

## 2018-01-19 DIAGNOSIS — C7951 Secondary malignant neoplasm of bone: Principal | ICD-10-CM

## 2018-01-19 MED ORDER — RIVAROXABAN 10 MG PO TABS: 10.0000 mg | ORAL_TABLET | Freq: Every day | ORAL | 3 refills | Status: DC

## 2018-02-01 ENCOUNTER — Other Ambulatory Visit (HOSPITAL_COMMUNITY): Payer: Self-pay | Admitting: *Deleted

## 2018-02-01 DIAGNOSIS — C61 Malignant neoplasm of prostate: Secondary | ICD-10-CM

## 2018-02-01 DIAGNOSIS — C7951 Secondary malignant neoplasm of bone: Principal | ICD-10-CM

## 2018-02-01 NOTE — Progress Notes (Signed)
Pt called clinic c/o head hurting day and night for about 3 weeks now also c/o Pressure behind his eyes.   He has an appointment with his primary care physician tomorrow.   I spoke with Dr. [name] and he states that patient also needs a MRI of Brain w/wo contrast to rule out any brain mets.    I have scheduled MRI and patient is aware.

## 2018-02-02 DIAGNOSIS — M791 Myalgia, unspecified site: Secondary | ICD-10-CM | POA: Diagnosis not present

## 2018-02-02 DIAGNOSIS — C787 Secondary malignant neoplasm of liver and intrahepatic bile duct: Secondary | ICD-10-CM | POA: Diagnosis not present

## 2018-02-02 DIAGNOSIS — G620 Drug-induced polyneuropathy: Secondary | ICD-10-CM | POA: Diagnosis not present

## 2018-02-02 DIAGNOSIS — E119 Type 2 diabetes mellitus without complications: Secondary | ICD-10-CM | POA: Diagnosis not present

## 2018-02-02 DIAGNOSIS — I2699 Other pulmonary embolism without acute cor pulmonale: Secondary | ICD-10-CM | POA: Diagnosis not present

## 2018-02-02 DIAGNOSIS — C61 Malignant neoplasm of prostate: Secondary | ICD-10-CM | POA: Diagnosis not present

## 2018-02-02 DIAGNOSIS — C7951 Secondary malignant neoplasm of bone: Secondary | ICD-10-CM | POA: Diagnosis not present

## 2018-02-02 DIAGNOSIS — Z6829 Body mass index (BMI) 29.0-29.9, adult: Secondary | ICD-10-CM | POA: Diagnosis not present

## 2018-02-05 ENCOUNTER — Ambulatory Visit (HOSPITAL_COMMUNITY)
Admission: RE | Admit: 2018-02-05 | Discharge: 2018-02-05 | Disposition: A | Discharge status: Home / Self Care | Payer: Medicare Other | Source: Ambulatory Visit | Attending: Hematology | Admitting: Hematology

## 2018-02-05 ENCOUNTER — Other Ambulatory Visit (HOSPITAL_COMMUNITY): Payer: Self-pay | Admitting: Hematology

## 2018-02-05 DIAGNOSIS — C7951 Secondary malignant neoplasm of bone: Principal | ICD-10-CM

## 2018-02-05 DIAGNOSIS — J0100 Acute maxillary sinusitis, unspecified: Secondary | ICD-10-CM

## 2018-02-05 DIAGNOSIS — C61 Malignant neoplasm of prostate: Secondary | ICD-10-CM

## 2018-02-05 MED ORDER — AMOXICILLIN 500 MG PO CAPS: 500.0000 mg | ORAL_CAPSULE | Freq: Three times a day (TID) | ORAL | 0 refills | Status: DC

## 2018-02-05 MED ORDER — GADOBUTROL 1 MMOL/ML IV SOLN
10.0000 mL | Freq: Once | INTRAVENOUS | Status: AC | PRN
  Administered 2018-02-05: 10 mL via INTRAVENOUS

## 2018-02-07 ENCOUNTER — Other Ambulatory Visit: Payer: Self-pay

## 2018-02-07 ENCOUNTER — Inpatient Hospital Stay (HOSPITAL_COMMUNITY)
Admission: EM | Admit: 2018-02-07 | Discharge: 2018-02-10 | DRG: 445 | Disposition: A | Discharge status: Home / Self Care | Payer: Medicare Other | Attending: Family Medicine | Admitting: Family Medicine

## 2018-02-07 ENCOUNTER — Inpatient Hospital Stay (HOSPITAL_COMMUNITY): Payer: Medicare Other

## 2018-02-07 ENCOUNTER — Encounter (HOSPITAL_COMMUNITY): Payer: Self-pay | Admitting: Emergency Medicine

## 2018-02-07 ENCOUNTER — Emergency Department (HOSPITAL_COMMUNITY): Payer: Medicare Other

## 2018-02-07 DIAGNOSIS — Z96652 Presence of left artificial knee joint: Secondary | ICD-10-CM | POA: Diagnosis present

## 2018-02-07 DIAGNOSIS — K8000 Calculus of gallbladder with acute cholecystitis without obstruction: Secondary | ICD-10-CM | POA: Diagnosis not present

## 2018-02-07 DIAGNOSIS — R110 Nausea: Secondary | ICD-10-CM | POA: Diagnosis not present

## 2018-02-07 DIAGNOSIS — Z79899 Other long term (current) drug therapy: Secondary | ICD-10-CM | POA: Diagnosis not present

## 2018-02-07 DIAGNOSIS — Z79890 Hormone replacement therapy (postmenopausal): Secondary | ICD-10-CM | POA: Diagnosis not present

## 2018-02-07 DIAGNOSIS — Z7984 Long term (current) use of oral hypoglycemic drugs: Secondary | ICD-10-CM | POA: Diagnosis not present

## 2018-02-07 DIAGNOSIS — C7951 Secondary malignant neoplasm of bone: Secondary | ICD-10-CM | POA: Diagnosis present

## 2018-02-07 DIAGNOSIS — Z7901 Long term (current) use of anticoagulants: Secondary | ICD-10-CM

## 2018-02-07 DIAGNOSIS — I10 Essential (primary) hypertension: Secondary | ICD-10-CM | POA: Diagnosis present

## 2018-02-07 DIAGNOSIS — Z7952 Long term (current) use of systemic steroids: Secondary | ICD-10-CM | POA: Diagnosis not present

## 2018-02-07 DIAGNOSIS — K819 Cholecystitis, unspecified: Secondary | ICD-10-CM | POA: Diagnosis not present

## 2018-02-07 DIAGNOSIS — Z8249 Family history of ischemic heart disease and other diseases of the circulatory system: Secondary | ICD-10-CM

## 2018-02-07 DIAGNOSIS — R1114 Bilious vomiting: Secondary | ICD-10-CM

## 2018-02-07 DIAGNOSIS — C787 Secondary malignant neoplasm of liver and intrahepatic bile duct: Secondary | ICD-10-CM | POA: Diagnosis present

## 2018-02-07 DIAGNOSIS — Z841 Family history of disorders of kidney and ureter: Secondary | ICD-10-CM | POA: Diagnosis not present

## 2018-02-07 DIAGNOSIS — Z86711 Personal history of pulmonary embolism: Secondary | ICD-10-CM | POA: Diagnosis not present

## 2018-02-07 DIAGNOSIS — E119 Type 2 diabetes mellitus without complications: Secondary | ICD-10-CM | POA: Diagnosis present

## 2018-02-07 DIAGNOSIS — R112 Nausea with vomiting, unspecified: Secondary | ICD-10-CM | POA: Diagnosis not present

## 2018-02-07 DIAGNOSIS — K810 Acute cholecystitis: Secondary | ICD-10-CM | POA: Diagnosis not present

## 2018-02-07 DIAGNOSIS — C61 Malignant neoplasm of prostate: Secondary | ICD-10-CM | POA: Diagnosis present

## 2018-02-07 DIAGNOSIS — G4730 Sleep apnea, unspecified: Secondary | ICD-10-CM | POA: Diagnosis present

## 2018-02-07 DIAGNOSIS — Z8546 Personal history of malignant neoplasm of prostate: Secondary | ICD-10-CM | POA: Diagnosis not present

## 2018-02-07 DIAGNOSIS — Z66 Do not resuscitate: Secondary | ICD-10-CM | POA: Diagnosis present

## 2018-02-07 DIAGNOSIS — R1110 Vomiting, unspecified: Secondary | ICD-10-CM | POA: Diagnosis not present

## 2018-02-07 LAB — URINALYSIS, ROUTINE W REFLEX MICROSCOPIC
Bilirubin Urine: NEGATIVE
Glucose, UA: NEGATIVE mg/dL
Hgb urine dipstick: NEGATIVE
Ketones, ur: NEGATIVE mg/dL
Leukocytes, UA: NEGATIVE
Nitrite: NEGATIVE
Protein, ur: NEGATIVE mg/dL
Specific Gravity, Urine: 1.018 (ref 1.005–1.030)
pH: 7 (ref 5.0–8.0)

## 2018-02-07 LAB — COMPREHENSIVE METABOLIC PANEL
ALT: 34 U/L (ref 0–44)
AST: 84 U/L — ABNORMAL HIGH (ref 15–41)
Albumin: 3.1 g/dL — ABNORMAL LOW (ref 3.5–5.0)
Alkaline Phosphatase: 284 U/L — ABNORMAL HIGH (ref 38–126)
Anion gap: 11 (ref 5–15)
BUN: 15 mg/dL (ref 8–23)
CO2: 22 mmol/L (ref 22–32)
Calcium: 8.4 mg/dL — ABNORMAL LOW (ref 8.9–10.3)
Chloride: 101 mmol/L (ref 98–111)
Creatinine, Ser: 0.66 mg/dL (ref 0.61–1.24)
GFR calc Af Amer: >60 mL/min (ref >60)
GFR calc non Af Amer: >60 mL/min (ref >60)
Glucose, Bld: 173 mg/dL — ABNORMAL HIGH (ref 70–99)
Potassium: 3.6 mmol/L (ref 3.5–5.1)
SODIUM: 134 mmol/L — AB (ref 135–145)
Total Bilirubin: 0.7 mg/dL (ref 0.3–1.2)
Total Protein: 6.5 g/dL (ref 6.5–8.1)

## 2018-02-07 LAB — CBC WITH DIFFERENTIAL/PLATELET
Abs Immature Granulocytes: 0.19 10*3/uL — ABNORMAL HIGH (ref 0.00–0.07)
Basophils Absolute: 0 10*3/uL (ref 0.0–0.1)
Basophils Relative: 0 %
Eosinophils Absolute: 0 10*3/uL (ref 0.0–0.5)
Eosinophils Relative: 0 %
HCT: 30.3 % — ABNORMAL LOW (ref 39.0–52.0)
Hemoglobin: 9.2 g/dL — ABNORMAL LOW (ref 13.0–17.0)
IMMATURE GRANULOCYTES: 2 %
Lymphocytes Relative: 5 %
Lymphs Abs: 0.5 10*3/uL — ABNORMAL LOW (ref 0.7–4.0)
MCH: 28.8 pg (ref 26.0–34.0)
MCHC: 30.4 g/dL (ref 30.0–36.0)
MCV: 94.7 fL (ref 80.0–100.0)
Monocytes Absolute: 0.7 10*3/uL (ref 0.1–1.0)
Monocytes Relative: 8 %
Neutro Abs: 7.9 10*3/uL — ABNORMAL HIGH (ref 1.7–7.7)
Neutrophils Relative %: 85 %
Platelets: 233 10*3/uL (ref 150–400)
RBC: 3.2 MIL/uL — AB (ref 4.22–5.81)
RDW: 19.7 % — ABNORMAL HIGH (ref 11.5–15.5)
WBC: 9.3 10*3/uL (ref 4.0–10.5)
nRBC: 0.6 % — ABNORMAL HIGH (ref 0.0–0.2)

## 2018-02-07 LAB — HEPARIN LEVEL (UNFRACTIONATED): Heparin Unfractionated: 1.08 IU/mL — ABNORMAL HIGH (ref 0.30–0.70)

## 2018-02-07 LAB — APTT: aPTT: 27 seconds (ref 24–36)

## 2018-02-07 LAB — LIPASE, BLOOD: Lipase: 24 U/L (ref 11–51)

## 2018-02-07 LAB — SAMPLE TO BLOOD BANK

## 2018-02-07 MED ORDER — LEVOTHYROXINE SODIUM 25 MCG PO TABS
25.0000 ug | ORAL_TABLET | Freq: Every day | ORAL | Status: DC
  Administered 2018-02-07 – 2018-02-10 (×4): 25 ug via ORAL
  Filled 2018-02-07 (×4): qty 1

## 2018-02-07 MED ORDER — HYDROMORPHONE HCL 1 MG/ML IJ SOLN
1.0000 mg | Freq: Once | INTRAMUSCULAR | Status: AC
  Administered 2018-02-07: 1 mg via INTRAVENOUS
  Filled 2018-02-07: qty 1

## 2018-02-07 MED ORDER — ONDANSETRON HCL 4 MG/2ML IJ SOLN: 4.0000 mg | Freq: Four times a day (QID) | INTRAMUSCULAR | Status: DC | PRN

## 2018-02-07 MED ORDER — ONDANSETRON HCL 4 MG/2ML IJ SOLN
4.0000 mg | Freq: Four times a day (QID) | INTRAMUSCULAR | Status: DC | PRN
  Administered 2018-02-07 – 2018-02-08 (×4): 4 mg via INTRAVENOUS
  Filled 2018-02-07 (×5): qty 2

## 2018-02-07 MED ORDER — IOPAMIDOL (ISOVUE-300) INJECTION 61%
100.0000 mL | Freq: Once | INTRAVENOUS | Status: AC | PRN
  Administered 2018-02-07: 100 mL via INTRAVENOUS

## 2018-02-07 MED ORDER — PROMETHAZINE HCL 25 MG/ML IJ SOLN
12.5000 mg | Freq: Four times a day (QID) | INTRAMUSCULAR | Status: DC | PRN
  Administered 2018-02-07 (×3): 12.5 mg via INTRAVENOUS
  Filled 2018-02-07 (×3): qty 1

## 2018-02-07 MED ORDER — ONDANSETRON HCL 4 MG/2ML IJ SOLN
4.0000 mg | Freq: Once | INTRAMUSCULAR | Status: AC
  Administered 2018-02-07: 4 mg via INTRAVENOUS
  Filled 2018-02-07: qty 2

## 2018-02-07 MED ORDER — PIPERACILLIN-TAZOBACTAM 3.375 G IVPB: 3.3750 g | Freq: Three times a day (TID) | INTRAVENOUS | Status: DC

## 2018-02-07 MED ORDER — ENOXAPARIN SODIUM 40 MG/0.4ML ~~LOC~~ SOLN
40.0000 mg | SUBCUTANEOUS | Status: DC
  Administered 2018-02-07 – 2018-02-08 (×2): 40 mg via SUBCUTANEOUS
  Filled 2018-02-07 (×2): qty 0.4

## 2018-02-07 MED ORDER — HYDROCORTISONE NA SUCCINATE PF 100 MG IJ SOLR
50.0000 mg | Freq: Three times a day (TID) | INTRAMUSCULAR | Status: DC
  Administered 2018-02-07 – 2018-02-09 (×7): 50 mg via INTRAVENOUS
  Filled 2018-02-07 (×7): qty 2

## 2018-02-07 MED ORDER — ATENOLOL 25 MG PO TABS: 100.0000 mg | ORAL_TABLET | Freq: Every day | ORAL | Status: DC

## 2018-02-07 MED ORDER — SODIUM CHLORIDE 0.9 % IV SOLN
INTRAVENOUS | Status: DC | PRN
  Administered 2018-02-07: 500 mL via INTRAVENOUS

## 2018-02-07 MED ORDER — TAMSULOSIN HCL 0.4 MG PO CAPS
0.8000 mg | ORAL_CAPSULE | Freq: Every morning | ORAL | Status: DC
  Administered 2018-02-07 – 2018-02-10 (×4): 0.8 mg via ORAL
  Filled 2018-02-07 (×4): qty 2

## 2018-02-07 MED ORDER — HEPARIN SOD (PORK) LOCK FLUSH 100 UNIT/ML IV SOLN
INTRAVENOUS | Status: AC
  Administered 2018-02-07: 01:00:00
  Filled 2018-02-07: qty 5

## 2018-02-07 MED ORDER — DILTIAZEM HCL ER COATED BEADS 300 MG PO CP24: 300.0000 mg | ORAL_CAPSULE | Freq: Every day | ORAL | Status: DC

## 2018-02-07 MED ORDER — ACETAMINOPHEN 650 MG RE SUPP: 650.0000 mg | Freq: Four times a day (QID) | RECTAL | Status: DC | PRN

## 2018-02-07 MED ORDER — ACETAMINOPHEN 325 MG PO TABS: 650.0000 mg | ORAL_TABLET | Freq: Four times a day (QID) | ORAL | Status: DC | PRN

## 2018-02-07 MED ORDER — MORPHINE SULFATE ER 30 MG PO TBCR
60.0000 mg | EXTENDED_RELEASE_TABLET | Freq: Two times a day (BID) | ORAL | Status: DC
  Administered 2018-02-07 – 2018-02-10 (×7): 60 mg via ORAL
  Filled 2018-02-07 (×7): qty 2

## 2018-02-07 MED ORDER — PIPERACILLIN-TAZOBACTAM 3.375 G IVPB
3.3750 g | Freq: Three times a day (TID) | INTRAVENOUS | Status: DC
  Administered 2018-02-07 – 2018-02-10 (×9): 3.375 g via INTRAVENOUS
  Filled 2018-02-07 (×10): qty 50

## 2018-02-07 MED ORDER — GABAPENTIN 300 MG PO CAPS
300.0000 mg | ORAL_CAPSULE | Freq: Every day | ORAL | Status: DC
  Administered 2018-02-07 – 2018-02-10 (×4): 300 mg via ORAL
  Filled 2018-02-07 (×5): qty 1

## 2018-02-07 MED ORDER — HYDROMORPHONE HCL 1 MG/ML IJ SOLN: 0.5000 mg | INTRAMUSCULAR | Status: DC | PRN

## 2018-02-07 MED ORDER — HYDROMORPHONE HCL 1 MG/ML IJ SOLN
1.0000 mg | INTRAMUSCULAR | Status: DC | PRN
  Administered 2018-02-07 – 2018-02-10 (×11): 1 mg via INTRAVENOUS
  Filled 2018-02-07 (×11): qty 1

## 2018-02-07 MED ORDER — GABAPENTIN 300 MG PO CAPS
600.0000 mg | ORAL_CAPSULE | Freq: Every day | ORAL | Status: DC
  Administered 2018-02-07 – 2018-02-09 (×3): 600 mg via ORAL
  Filled 2018-02-07 (×4): qty 2

## 2018-02-07 MED ORDER — SODIUM CHLORIDE 0.9 % IV BOLUS
1000.0000 mL | Freq: Once | INTRAVENOUS | Status: AC
  Administered 2018-02-07: 1000 mL via INTRAVENOUS

## 2018-02-07 MED ORDER — PIPERACILLIN-TAZOBACTAM 3.375 G IVPB 30 MIN
3.3750 g | Freq: Once | INTRAVENOUS | Status: AC
  Administered 2018-02-07: 3.375 g via INTRAVENOUS
  Filled 2018-02-07: qty 50

## 2018-02-07 NOTE — Plan of Care (Signed)

## 2018-02-07 NOTE — Progress Notes (Signed)
Patient seen and examined. Admitted after midnight due to abd pain [id number]), nausea and vomiting. Found with acute cholecystitis and cholelithiasis. No CBD dilatation. After antiemetics and analgesics he is feeling better. VSS. Patient with metastatic prostate cancer, including extensive liver metastasis. Will continue IVF's, zosyn and PRN analgesics and antiemetics. Stopping xarelto and using Lovenox in anticipation for interventions. General surgery consulted and will follow recommendations. Please refer to H&P written by Dr. Alcario Drought for further info/details on admission.  Barton Dubois MD [phone]

## 2018-02-07 NOTE — Consult Note (Signed)
[facility] Consult  Reason for Consult: Acute Cholecystitis  Referring Physician:  Dr. Dyann Kief  Chief Complaint    Emesis      Micheal Davis is a 72 y.o. male.  HPI: Micheal Davis is a 72 yo with a history of metastatic prostate cancer to the liver and bone who presented with a 2 week history of abdominal pain in the upper abdomen as well as reported nausea.  He has been on po chemotherapy agents and IV agents recently, and recently switched to a new chemotherapy agent at the beginning of December.  He started to develop vomiting prior to his arrival to the ED and reports worsening symptoms of upper abdominal pain along with the vomiting.  He was seen in the ED and a CT and [place] were performed.  The US demonstrated a thickened gallbladder consistent with acute cholecystitis and no ductal dilation.   He has a history of a PE in August and is on Xarelto per his and his wife's report.     Past Medical History:  Diagnosis Date  . Diabetes mellitus without complication ([place])   . Hypertension   . Prostate cancer ([place]) 09/05/2015  . Prostate cancer metastatic to bone ([place]) 09/05/2015  . Sleep apnea   . Thyroid disease     Past Surgical History:  Procedure Laterality Date  . CATARACT EXTRACTION    . PORTACATH PLACEMENT Left 12/31/2015   Procedure: INSERTION PORT-A-CATH LEFT SUBCLAVIAN;  Surgeon: Aviva Signs, MD;  Location: [facility];  Service: General;  Laterality: Left;  . REPLACEMENT TOTAL KNEE Left     Family History  Problem Relation Age of Onset  . Renal Disease Father   . Vascular Disease Father        Carotid artery disease  . Hypertension Brother     Social History   Tobacco Use  . Smoking status: Never Smoker  . Smokeless tobacco: Never Used  Substance Use Topics  . Alcohol use: Not Currently    Comment: 1 beer each month  . Drug use: No    Medications:  I have reviewed the patient's current medications. Prior to Admission:  Medications Prior to  Admission  Medication Sig Dispense Refill Last Dose  . aspirin EC 81 MG tablet Take 81 mg by mouth daily.   02/05/2018  . atenolol (TENORMIN) 100 MG tablet Take 100 mg by mouth daily.   02/05/2018 at 0700am  . atorvastatin (LIPITOR) 20 MG tablet Take 10 mg by mouth every morning.    02/05/2018  . Calcium Carb-Cholecalciferol (CALCIUM PLUS VITAMIN D3) 600-500 MG-UNIT CAPS Take 1,200 mg by mouth daily.   02/05/2018  . chlorhexidine (PERIDEX) 0.12 % solution Use as directed 15 mLs in the mouth or throat 2 (two) times daily.   Past Month at Unknown time  . cyclobenzaprine (FLEXERIL) 10 MG tablet Take 10 mg by mouth 3 (three) times daily as needed for muscle spasms.   02/05/2018  . diltiazem (CARDIZEM CD) 300 MG 24 hr capsule Take 300 mg by mouth daily.   02/05/2018  . Diphenhyd-Hydrocort-Nystatin (FIRST-DUKES MOUTHWASH) SUSP Use as directed 15 mLs in the mouth or throat 4 (four) times daily as needed. 300 mL 1 Past Week at Unknown time  . gabapentin (NEURONTIN) 300 MG capsule Patient may take 1 capsule in the morning and 2 capsules @ bedtime. (Patient taking differently: Take 300 mg by mouth 2 (two) times daily. Patient may take 1 capsule in the morning and 2 capsules @ bedtime.)  90 capsule 3 02/05/2018  . Glucosamine-Chondroit-Vit C-Mn (GLUCOSAMINE 1500 COMPLEX PO) Take 1 tablet by mouth 2 (two) times daily.    02/05/2018  . leuprolide (LUPRON) 7.5 MG injection Inject 7.5 mg into the muscle every 28 (twenty-eight) days.   october  . levothyroxine (SYNTHROID, LEVOTHROID) 25 MCG tablet Take 25 mcg by mouth daily before breakfast.   02/07/2018 at Unknown time  . loperamide (IMODIUM) 1 MG/5ML solution Take 1 mg by mouth as needed for diarrhea or loose stools.   unknown  . metFORMIN (GLUCOPHAGE) 500 MG tablet Take 500 mg by mouth 2 (two) times daily with a meal.   02/05/2018  . morphine (MS CONTIN) 60 MG 12 hr tablet Take 1 tablet (60 mg total) by mouth every 12 (twelve) hours. 60 tablet 0 02/04/2018  . morphine  (MSIR) 30 MG tablet Take 1 tablet (30 mg total) by mouth every 6 (six) hours as needed for severe pain. 60 tablet 0 02/04/2018  . Multiple Vitamin (MULTIVITAMIN WITH MINERALS) TABS tablet Take 1 tablet by mouth daily.   02/05/2018  . Omega-3 Fatty Acids (FISH OIL PO) Take 1 capsule by mouth daily.   02/05/2018  . ondansetron (ZOFRAN-ODT) 8 MG disintegrating tablet Take 1 tablet (8 mg total) by mouth every 8 (eight) hours as needed for nausea or vomiting. 60 tablet 1 02/06/2018  . potassium chloride SA (KLOR-CON M20) 20 MEQ tablet TAKE 20 MEQ BY MOUTH ONCE DAILY (Patient taking differently: Take 20 mEq by mouth daily. TAKE 20 MEQ BY MOUTH ONCE DAILY) 90 tablet 1 02/05/2018  . predniSONE (DELTASONE) 5 MG tablet Take 1 tablet (5 mg total) by mouth 2 (two) times daily. (Patient taking differently: Take 7.5 mg by mouth daily. ) 62 tablet 4 02/05/2018  . rivaroxaban (XARELTO) 10 MG TABS tablet Take 1 tablet (10 mg total) by mouth daily. 30 tablet 3 02/06/2018 at 0700am  . tamsulosin (FLOMAX) 0.4 MG CAPS capsule Take 0.8 mg by mouth every morning.    02/05/2018  . traMADol (ULTRAM) 50 MG tablet Take 50 mg by mouth daily.   02/05/2018  . vitamin E 400 UNIT capsule Take 800 Units by mouth daily.   02/05/2018  . triamterene-hydrochlorothiazide (MAXZIDE-25) 37.5-25 MG tablet Take 1 tablet by mouth daily. for high blood pressure  3 02/05/2018   Scheduled: . enoxaparin (LOVENOX) injection  40 mg Subcutaneous Q24H  . gabapentin  300 mg Oral Daily  . gabapentin  600 mg Oral QHS  . hydrocortisone sod succinate (SOLU-CORTEF) inj  50 mg Intravenous Q8H  . levothyroxine  25 mcg Oral QAC breakfast  . morphine  60 mg Oral Q12H  . potassium chloride  40 mEq Oral Once  . tamsulosin  0.8 mg Oral q morning - 10a   Continuous: . sodium chloride 500 mL (02/07/18 2308)  . piperacillin-tazobactam (ZOSYN)  IV 3.375 g (02/08/18 0629)   QHU:TMLYYT chloride, acetaminophen **OR** acetaminophen, HYDROmorphone (DILAUDID) injection,  ondansetron (ZOFRAN) IV, promethazine  No Known Allergies   ROS:  A comprehensive review of systems was negative except for: Gastrointestinal: positive for abdominal pain, nausea and vomiting Genitourinary: positive for metastatic prostate cancer  Blood pressure 134/88, pulse 64, temperature 97.9 F (36.6 C), temperature source Oral, resp. rate 17, height 5\' 10"  (1.778 m), weight 90.7 kg, SpO2 96 %. Physical Exam  Constitutional: He is oriented to person, place, and time. He appears well-developed.  HENT:  Head: Normocephalic.  Eyes: Pupils are equal, round, and reactive to light. EOM are normal.  Neck: Normal range of motion.  Cardiovascular: Normal rate.  Pulmonary/Chest: Effort normal.  Abdominal: Soft. He exhibits no distension. There is tenderness in the right upper quadrant, epigastric area and left upper quadrant. There is no rebound.  Musculoskeletal: Normal range of motion.  Neurological: He is alert and oriented to person, place, and time.  Skin: Skin is warm and dry.  Psychiatric: He has a normal mood and affect. His behavior is normal. Judgment and thought content normal.  Vitals reviewed.   Results: Results for orders placed or performed during the hospital encounter of 02/07/18 (from the past 48 hour(s))  Sample to Blood Bank     Status: None   Collection Time: 02/07/18  1:50 AM  Result Value Ref Range   Blood Bank Specimen SAMPLE AVAILABLE FOR TESTING    Sample Expiration      02/08/2018 Performed at [facility], [address] [place], [place] [address]   Comprehensive metabolic panel     Status: Abnormal   Collection Time: 02/07/18  1:51 AM  Result Value Ref Range   Sodium 134 (L) 135 - 145 mmol/L   Potassium 3.6 3.5 - 5.1 mmol/L   Chloride 101 98 - 111 mmol/L   CO2 22 22 - 32 mmol/L   Glucose, Bld 173 (H) 70 - 99 mg/dL   BUN 15 8 - 23 mg/dL   Creatinine, Ser 0.66 0.61 - 1.24 mg/dL   Calcium 8.4 (L) 8.9 - 10.3 mg/dL   Total Protein 6.5 6.5 - 8.1 g/dL    Albumin 3.1 (L) 3.5 - 5.0 g/dL   AST 84 (H) 15 - 41 U/L   ALT 34 0 - 44 U/L   Alkaline Phosphatase 284 (H) 38 - 126 U/L   Total Bilirubin 0.7 0.3 - 1.2 mg/dL   GFR calc non Af Amer >60 >60 mL/min   GFR calc Af Amer >60 >60 mL/min   Anion gap 11 5 - 15    Comment: Performed at [facility], [address] [place], [place] [address]  Lipase, blood     Status: None   Collection Time: 02/07/18  1:51 AM  Result Value Ref Range   Lipase 24 11 - 51 U/L    Comment: Performed at [facility], [address] [place], [place] [address]  CBC with Differential     Status: Abnormal   Collection Time: 02/07/18  1:51 AM  Result Value Ref Range   WBC 9.3 4.0 - 10.5 K/uL   RBC 3.20 (L) 4.22 - 5.81 MIL/uL   Hemoglobin 9.2 (L) 13.0 - 17.0 g/dL   HCT 30.3 (L) 39.0 - 52.0 %   MCV 94.7 80.0 - 100.0 fL   MCH 28.8 26.0 - 34.0 pg   MCHC 30.4 30.0 - 36.0 g/dL   RDW 19.7 (H) 11.5 - 15.5 %   Platelets 233 150 - 400 K/uL   nRBC 0.6 (H) 0.0 - 0.2 %   Neutrophils Relative % 85 %   Neutro Abs 7.9 (H) 1.7 - 7.7 K/uL   Lymphocytes Relative 5 %   Lymphs Abs 0.5 (L) 0.7 - 4.0 K/uL   Monocytes Relative 8 %   Monocytes Absolute 0.7 0.1 - 1.0 K/uL   Eosinophils Relative 0 %   Eosinophils Absolute 0.0 0.0 - 0.5 K/uL   Basophils Relative 0 %   Basophils Absolute 0.0 0.0 - 0.1 K/uL   Immature Granulocytes 2 %   Abs Immature Granulocytes 0.19 (H) 0.00 - 0.07 K/uL  Comment: Performed at [facility], [address], [place], [place] [address]  Urinalysis, Routine w reflex microscopic     Status: None   Collection Time: 02/07/18  3:08 AM  Result Value Ref Range   Color, Urine YELLOW YELLOW   APPearance CLEAR CLEAR   Specific Gravity, Urine 1.018 1.005 - 1.030   pH 7.0 5.0 - 8.0   Glucose, UA NEGATIVE NEGATIVE mg/dL   Hgb urine dipstick NEGATIVE NEGATIVE   Bilirubin Urine NEGATIVE NEGATIVE   Ketones, ur NEGATIVE NEGATIVE mg/dL   Protein, ur NEGATIVE NEGATIVE mg/dL   Nitrite NEGATIVE NEGATIVE   Leukocytes,  UA NEGATIVE NEGATIVE    Comment: Performed at [facility], [address] [place], [place] [address]  APTT     Status: None   Collection Time: 02/07/18  5:47 AM  Result Value Ref Range   aPTT 27 24 - 36 seconds    Comment: Performed at [facility], [address] [place], [place] [address]  Heparin level (unfractionated)     Status: Abnormal   Collection Time: 02/07/18  5:47 AM  Result Value Ref Range   Heparin Unfractionated 1.08 (H) 0.30 - 0.70 IU/mL    Comment: (NOTE) If heparin results are below expected values, and patient dosage has  been confirmed, suggest follow up testing of antithrombin III levels. Performed at [facility], [address] [place], [place] [address]     Personally reviewed CT and [place]- thickened distended gallbladder without ductal dilation, extensive liver metastasis    Micheal Davis Wo Contrast  Result Date: 02/05/2018 CLINICAL DATA:  Metastatic prostate cancer. Right-sided headache over the last 3 weeks. Tingling and numbness of the left arm. EXAM: MRI HEAD WITHOUT AND WITH CONTRAST TECHNIQUE: Multiplanar, multiecho pulse sequences of the brain and surrounding structures were obtained without and with intravenous contrast. CONTRAST:  10 cc Gadavist COMPARISON:  Head CT 10/06/2017 and 10/20/2012. FINDINGS: Brain: Diffusion imaging does not show any acute or subacute infarction. There are mild chronic small-vessel ischemic changes of the cerebral hemispheric white matter. No evidence of metastatic disease. No hydrocephalus. There is been resorption of the previously seen right subdural hematoma. There is now only right-sided dural thickening measuring maximal about 3 mm. Mild dural thickening and enhancement seen elsewhere, reactive. No recurrent subdural hematoma. No mass effect or midline shift. Vascular: Major vessels at the base of the brain show flow. Skull and upper cervical spine: Widespread metastatic disease affecting the calvarium and the skull base as seen  previously. No evidence of extraosseous tumor. Sinuses/Orbits: Acute right maxillary sinusitis which could be symptomatic. Other sinuses are clear. Orbits negative. Other: None IMPRESSION: Acute right-sided maxillary sinusitis which could be a cause of pain. No acute brain finding. Mild chronic small-vessel change of the white matter. No evidence of metastatic disease to the brain. Resorption of the previously seen right subdural hematoma. Mild dural thickening on the right, maximal thickness 3 mm. No mass effect. Osseous metastatic disease affecting the calvarium and the skull base. Electronically Signed   By: Nelson Chimes M.D.   On: 02/05/2018 14:06   Ct Abdomen Pelvis W Contrast  Result Date: 02/07/2018 CLINICAL DATA:  72 year old male with epigastric abdominal pain and vomiting. History of prostate cancer metastatic to bone. EXAM: CT ABDOMEN AND PELVIS WITH CONTRAST TECHNIQUE: Multidetector CT imaging of the abdomen and pelvis was performed using the standard protocol following bolus administration of intravenous contrast. CONTRAST:  115mL ISOVUE-300 IOPAMIDOL (ISOVUE-300) INJECTION 61% COMPARISON:  CT of the abdomen  pelvis dated 10/06/2017 FINDINGS: Lower chest: Minimal bibasilar atelectatic changes. There is mild cardiomegaly. No intra-abdominal free air or free fluid. Hepatobiliary: Multiple hepatic metastatic disease with the largest measuring approximately 9.1 x 9.7 cm (previously 5.1 x 5.5 cm) in segment IV. Significant interval increase in the number and size of the metastatic disease compared to the prior CT. There is diffuse thickening of the gallbladder wall which may be related to underlying systemic process or liver dysfunction. Further evaluation with ultrasound recommended to exclude acute cholecystitis. There is a noncalcified stone within the gallbladder. Pancreas: Unremarkable. No pancreatic ductal dilatation or surrounding inflammatory changes. Spleen: Normal in size without focal  abnormality. Adrenals/Urinary Tract: The adrenal glands are unremarkable. There is no hydronephrosis on either side. There is a 15 mm right renal upper pole cyst. Smaller left renal hypodense lesion is not characterized. The visualized ureters appear unremarkable. The urinary bladder is trabeculated. Stomach/Bowel: Scattered sigmoid diverticula without active inflammatory changes. Normal caliber fecalized loops of small bowel may represent increased transit time or small intestine bacterial overgrowth. There is no bowel obstruction or active inflammation. Normal appendix. Vascular/Lymphatic: Moderate aortoiliac atherosclerotic disease. No portal venous gas. There is no adenopathy. Reproductive: The prostate and seminal vesicles are grossly unremarkable. Other: None Musculoskeletal: Extensive osseous metastatic disease. Bilateral L5 pars defects. No acute osseous pathology. IMPRESSION: 1. Significant interval increase in the number and size of the hepatic metastatic disease compared to the prior CT. 2. Diffuse thickening of the gallbladder wall may be related to underlying systemic process or liver dysfunction. Further evaluation with ultrasound recommended. 3. Colonic diverticulosis. No bowel obstruction or active inflammation. Normal appendix. 4. Extensive osseous metastatic disease. Electronically Signed   By: Anner Crete M.D.   On: 02/07/2018 03:42   US Abdomen Limited Ruq  Result Date: 02/07/2018 CLINICAL DATA:  Epigastric pain and vomiting for 2 weeks. Cholelithiasis. Metastatic prostate carcinoma. EXAM: ULTRASOUND ABDOMEN LIMITED RIGHT UPPER QUADRANT COMPARISON:  CT on 02/07/2018 FINDINGS: Gallbladder: Multiple gallstones are seen measuring up to 2 cm. Diffuse gallbladder wall thickening is seen measuring up to 12 mm. Positive sonographic Murphy sign noted by sonographer. These findings are consistent with acute cholecystitis. Common bile duct: Diameter: 4 mm, within normal limits. Liver: Multiple  solid hepatic masses are again seen both the right and left lobes, largest measuring approximately 10 cm, consistent with diffuse liver metastases. Portal vein is patent on color Doppler imaging with normal direction of blood flow towards the liver. IMPRESSION: Findings consistent with acute cholecystitis. No evidence of biliary ductal dilatation. Diffuse liver metastases. Electronically Signed   By: Earle Gell M.D.   On: 02/07/2018 09:30     Assessment & Plan:  Micheal Davis is a 72 y.o. male with acute cholecystitis based on [place] and symptoms. He does not have a leukocytosis but has been on chemotherapy agents for years. He comes in with worsening Abdominal pain and nausea/vomiting.  Given his history of 2 weeks of pain in addition to his liver metastasis, he is not an operative candidate.    -Admit to medicine  -IV antibiotics -NPO except for ice chips today -If worsens, may need cholecystostomy tube -On Xarelto for PE in the past, may have to hold if needs cholecystostomy tube    All questions were answered to the satisfaction of the patient and family.   Virl Cagey 02/07/2018, 10:55 AM

## 2018-02-07 NOTE — ED Provider Notes (Signed)
[facility] EMERGENCY DEPARTMENT Provider Note   CSN: [id number] Arrival date & time: 02/07/18  0028  Time seen 1:25 AM   History   Chief Complaint Chief Complaint  Patient presents with  . Emesis    HPI Micheal Davis is a 72 y.o. male.  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  He also has some liver mets.  Wife states he was on oral agents for about 2-1/2 years and he has been getting IV chemotherapy for the past 3 years.  He was switched to a new chemotherapy agent and has had 3 treatments with that, the last was on Monday, December 2.  He states for a couple of weeks  he started getting upper abdominal pain that he describes as aching that he is never had before.  He states he started having vomiting yesterday, December 7.  Wife has a trash can from home and it is extremely yellow and bile in color.  He states he is never had any abdominal surgeries in the past.  Wife says the vomitus is without bleeding.  Patient is on Xarelto and has been spitting up blood which he relates to his gums bleeding.  He states nothing he does makes the pain worse or better.  They deny diarrhea or fever.  They deny rectal bleeding.  PCP [name], [name], MD Oncology Dr. [name]  Past Medical History:  Diagnosis Date  . Diabetes mellitus without complication ([place])   . Hypertension   . Prostate cancer ([place]) 09/05/2015  . Prostate cancer metastatic to bone ([place]) 09/05/2015  . Sleep apnea   . Thyroid disease     Patient Active Problem List   Diagnosis Date Noted  . Subdural hematoma ([place]) 10/07/2017  . Acute pulmonary embolism ([place]) 10/06/2017  . Sleep apnea 10/06/2017  . Hypertension 10/06/2017  . Thyroid disease 10/06/2017  . Diabetes mellitus without complication ([place]) 40/98/1191  . Gastroesophageal reflux disease 03/13/2016  . Prostate cancer metastatic to bone ([place]) 09/05/2015  . Nausea 08/21/2015  . SOB (shortness of breath) on exertion 08/21/2015  .  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 Osteoarthritis of right knee 01/16/2014    Past Surgical History:  Procedure Laterality Date  . CATARACT EXTRACTION    . PORTACATH PLACEMENT Left 12/31/2015   Procedure: INSERTION PORT-A-CATH LEFT SUBCLAVIAN;  Surgeon: Aviva Signs, MD;  Location: [facility];  Service: General;  Laterality: Left;  . REPLACEMENT TOTAL KNEE Left         Home Medications    Prior to Admission medications   Medication Sig Start Date End Date Taking? Authorizing Provider  amoxicillin (AMOXIL) 500 MG capsule Take 1 capsule (500 mg total) by mouth 3 (three) times daily. 01/18/18   Lockamy, Randi L, NP-C  amoxicillin (AMOXIL) 500 MG capsule Take 1 capsule (500 mg total) by mouth 3 (three) times daily. 02/05/18   Derek Jack, MD  atenolol (TENORMIN) 100 MG tablet Take 100 mg by mouth daily.    [provider]  atorvastatin (LIPITOR) 20 MG tablet Take 10 mg by mouth every morning.     [provider]  calcium carbonate (OS-CAL - DOSED IN MG OF ELEMENTAL CALCIUM) 1250 (500 Ca) MG tablet Take 1 tablet by mouth 2 (two) times daily.     [provider]  diltiazem (CARDIZEM CD) 300 MG 24 hr capsule Take 300 mg by mouth daily.  [provider]  Diphenhyd-Hydrocort-Nystatin (FIRST-DUKES MOUTHWASH) SUSP Use as directed 15 mLs in the mouth or throat 4 (four) times daily as needed. 12/28/17   Derek Jack, MD  gabapentin (NEURONTIN) 300 MG capsule Patient may take 1 capsule in the morning and 2 capsules @ bedtime. 10/19/17   Lockamy, Theresia Lo, NP-C  Glucosamine-Chondroit-Vit C-Mn (GLUCOSAMINE 1500 COMPLEX PO) Take 1 tablet by mouth 2 (two) times daily.     [provider]  leuprolide (LUPRON) 7.5 MG injection Inject 7.5 mg into the muscle every 28 (twenty-eight) days.    [provider]  levothyroxine (SYNTHROID, LEVOTHROID) 25 MCG tablet Take 25 mcg by mouth  daily before breakfast.    [provider]  loperamide (IMODIUM) 1 MG/5ML solution Take 1 mg by mouth as needed for diarrhea or loose stools.    [provider]  Lysine 500 MG CAPS Take 500 mg by mouth daily.    [provider]  metFORMIN (GLUCOPHAGE) 500 MG tablet Take 500 mg by mouth 2 (two) times daily with a meal.    [provider]  MITOXANTRONE HCL IV Inject into the vein every 21 ( twenty-one) days.    [provider]  morphine (MS CONTIN) 60 MG 12 hr tablet Take 1 tablet (60 mg total) by mouth every 12 (twelve) hours. 01/18/18   Lockamy, Randi L, NP-C  morphine (MSIR) 30 MG tablet Take 1 tablet (30 mg total) by mouth every 6 (six) hours as needed for severe pain. 01/18/18   Glennie Isle, NP-C  Multiple Vitamin (MULTIVITAMIN WITH MINERALS) TABS tablet Take 1 tablet by mouth daily.    [provider]  Omega-3 Fatty Acids (FISH OIL PO) Take 1 capsule by mouth daily. 03/21/16   [provider]  ondansetron (ZOFRAN-ODT) 8 MG disintegrating tablet Take 1 tablet (8 mg total) by mouth every 8 (eight) hours as needed for nausea or vomiting. 11/30/17   Lockamy, Randi L, NP-C  potassium chloride SA (KLOR-CON M20) 20 MEQ tablet TAKE 20 MEQ BY MOUTH ONCE DAILY 11/10/17   Derek Jack, MD  predniSONE (DELTASONE) 5 MG tablet Take 5 mg by mouth daily.     [provider]  predniSONE (DELTASONE) 5 MG tablet Take 1 tablet (5 mg total) by mouth 2 (two) times daily. 12/24/17   Derek Jack, MD  rivaroxaban (XARELTO) 10 MG TABS tablet Take 1 tablet (10 mg total) by mouth daily. 01/19/18   Lockamy, Randi L, NP-C  tamsulosin (FLOMAX) 0.4 MG CAPS capsule Take 0.8 mg by mouth every morning.     [provider]  triamterene-hydrochlorothiazide (MAXZIDE-25) 37.5-25 MG tablet Take 1 tablet by mouth daily. for high blood pressure 03/25/16   [provider]  prochlorperazine (COMPAZINE) 10 MG tablet Take 1 tablet (10  mg total) by mouth every 6 (six) hours as needed (Nausea or vomiting). Patient taking differently: Take 10 mg by mouth every 6 (six) hours as needed for nausea or vomiting.  01/16/16 08/27/17  Penland, Kelby Fam, MD    Family History Family History  Problem Relation Age of Onset  . Renal Disease Father   . Vascular Disease Father        Carotid artery disease  . Hypertension Brother     Social History Social History   Tobacco Use  . Smoking status: Never Smoker  . Smokeless tobacco: Never Used  Substance Use Topics  . Alcohol use: Not Currently    Comment: 1 beer each month  . Drug  use: No  lives at home Lives with spouse   Allergies   Patient has no known allergies.   Review of Systems Review of Systems  All other systems reviewed and are negative.    Physical Exam Updated Vital Signs BP 114/67   Pulse (!) 56   Temp 97.9 F (36.6 C) (Oral)   Resp 19   Ht 5\' 10"  (1.778 m)   Wt 90.7 kg   SpO2 96%   BMI 28.70 kg/m   Physical Exam  Constitutional: He is oriented to person, place, and time. He appears well-developed and well-nourished.  Non-toxic appearance. He does not appear ill. No distress.  HENT:  Head: Normocephalic and atraumatic.  Right Ear: External ear normal.  Left Ear: External ear normal.  Nose: Nose normal. No mucosal edema or rhinorrhea.  Mouth/Throat: Mucous membranes are dry. No dental abscesses or uvula swelling.  Eyes: Pupils are equal, round, and reactive to light. Conjunctivae and EOM are normal.  Neck: Normal range of motion and full passive range of motion without pain. Neck supple.  Cardiovascular: Normal rate, regular rhythm and normal heart sounds. Exam reveals no gallop and no friction rub.  No murmur heard. Pulmonary/Chest: Effort normal and breath sounds normal. No respiratory distress. He has no wheezes. He has no rhonchi. He has no rales. He exhibits no tenderness and no crepitus.  Abdominal: Soft. Normal appearance and bowel  sounds are normal. He exhibits no distension. There is no tenderness. There is no rebound and no guarding.    Patient has epigastric tenderness without guarding or rebound, I feel like I can feel the edge of his liver below his right costal margin.  Musculoskeletal: Normal range of motion. He exhibits no edema or tenderness.  Moves all extremities well.   Neurological: He is alert and oriented to person, place, and time. He has normal strength. No cranial nerve deficit.  Skin: Skin is warm, dry and intact. No rash noted. No erythema. There is pallor.  Psychiatric: He has a normal mood and affect. His speech is normal and behavior is normal. His mood appears not anxious.  Nursing note and vitals reviewed.    ED Treatments / Results  Labs (all labs ordered are listed, but only abnormal results are displayed) Results for orders placed or performed during the hospital encounter of 02/07/18  Comprehensive metabolic panel  Result Value Ref Range   Sodium 134 (L) 135 - 145 mmol/L   Potassium 3.6 3.5 - 5.1 mmol/L   Chloride 101 98 - 111 mmol/L   CO2 22 22 - 32 mmol/L   Glucose, Bld 173 (H) 70 - 99 mg/dL   BUN 15 8 - 23 mg/dL   Creatinine, Ser 0.66 0.61 - 1.24 mg/dL   Calcium 8.4 (L) 8.9 - 10.3 mg/dL   Total Protein 6.5 6.5 - 8.1 g/dL   Albumin 3.1 (L) 3.5 - 5.0 g/dL   AST 84 (H) 15 - 41 U/L   ALT 34 0 - 44 U/L   Alkaline Phosphatase 284 (H) 38 - 126 U/L   Total Bilirubin 0.7 0.3 - 1.2 mg/dL   GFR calc non Af Amer >60 >60 mL/min   GFR calc Af Amer >60 >60 mL/min   Anion gap 11 5 - 15  Lipase, blood  Result Value Ref Range   Lipase 24 11 - 51 U/L  CBC with Differential  Result Value Ref Range   WBC 9.3 4.0 - 10.5 K/uL   RBC 3.20 (L) 4.22 -  5.81 MIL/uL   Hemoglobin 9.2 (L) 13.0 - 17.0 g/dL   HCT 30.3 (L) 39.0 - 52.0 %   MCV 94.7 80.0 - 100.0 fL   MCH 28.8 26.0 - 34.0 pg   MCHC 30.4 30.0 - 36.0 g/dL   RDW 19.7 (H) 11.5 - 15.5 %   Platelets 233 150 - 400 K/uL   nRBC 0.6 (H) 0.0 -  0.2 %   Neutrophils Relative % 85 %   Neutro Abs 7.9 (H) 1.7 - 7.7 K/uL   Lymphocytes Relative 5 %   Lymphs Abs 0.5 (L) 0.7 - 4.0 K/uL   Monocytes Relative 8 %   Monocytes Absolute 0.7 0.1 - 1.0 K/uL   Eosinophils Relative 0 %   Eosinophils Absolute 0.0 0.0 - 0.5 K/uL   Basophils Relative 0 %   Basophils Absolute 0.0 0.0 - 0.1 K/uL   Immature Granulocytes 2 %   Abs Immature Granulocytes 0.19 (H) 0.00 - 0.07 K/uL  Urinalysis, Routine w reflex microscopic  Result Value Ref Range   Color, Urine YELLOW YELLOW   APPearance CLEAR CLEAR   Specific Gravity, Urine 1.018 1.005 - 1.030   pH 7.0 5.0 - 8.0   Glucose, UA NEGATIVE NEGATIVE mg/dL   Hgb urine dipstick NEGATIVE NEGATIVE   Bilirubin Urine NEGATIVE NEGATIVE   Ketones, ur NEGATIVE NEGATIVE mg/dL   Protein, ur NEGATIVE NEGATIVE mg/dL   Nitrite NEGATIVE NEGATIVE   Leukocytes, UA NEGATIVE NEGATIVE  Sample to Blood Bank  Result Value Ref Range   Blood Bank Specimen SAMPLE AVAILABLE FOR TESTING    Sample Expiration      02/08/2018 Performed at [facility], [address] [place], [place] [address]    Laboratory interpretation all normal except stable anemia, minor elevation of LFTs, hyperglycemia   EKG None  Radiology Mr Jeri Cos Wo Contrast  Result Date: 02/05/2018 CLINICAL DATA:  Metastatic prostate cancer. Right-sided headache over the last 3 weeks. Tingling and numbness of the left arm.  IMPRESSION: Acute right-sided maxillary sinusitis which could be a cause of pain. No acute brain finding. Mild chronic small-vessel change of the white matter. No evidence of metastatic disease to the brain. Resorption of the previously seen right subdural hematoma. Mild dural thickening on the right, maximal thickness 3 mm. No mass effect. Osseous metastatic disease affecting the calvarium and the skull base. Electronically Signed   By: Nelson Chimes M.D.   On: 02/05/2018 14:06   Ct Abdomen Pelvis W Contrast  Result Date: 02/07/2018 CLINICAL  DATA:  72 year old male with epigastric abdominal pain and vomiting. History of prostate cancer metastatic to bone. EXAM: CT ABDOMEN AND PELVIS WITH CONTRAST TECHNIQUE: Multidetector CT imaging of the abdomen and pelvis was performed using the standard protocol following bolus administration of intravenous contrast. CONTRAST:  172mL ISOVUE-300 IOPAMIDOL (ISOVUE-300) INJECTION 61% COMPARISON:  CT of the abdomen pelvis dated 10/06/2017 FINDINGS: Lower chest: Minimal bibasilar atelectatic changes. There is mild cardiomegaly. No intra-abdominal free air or free fluid. Hepatobiliary: Multiple hepatic metastatic disease with the largest measuring approximately 9.1 x 9.7 cm (previously 5.1 x 5.5 cm) in segment IV. Significant interval increase in the number and size of the metastatic disease compared to the prior CT. There is diffuse thickening of the gallbladder wall which may be related to underlying systemic process or liver dysfunction. Further evaluation with ultrasound recommended to exclude acute cholecystitis. There is a noncalcified stone within the gallbladder. Pancreas: Unremarkable. No pancreatic ductal dilatation or surrounding inflammatory changes. Spleen: Normal in size  without focal abnormality. Adrenals/Urinary Tract: The adrenal glands are unremarkable. There is no hydronephrosis on either side. There is a 15 mm right renal upper pole cyst. Smaller left renal hypodense lesion is not characterized. The visualized ureters appear unremarkable. The urinary bladder is trabeculated. Stomach/Bowel: Scattered sigmoid diverticula without active inflammatory changes. Normal caliber fecalized loops of small bowel may represent increased transit time or small intestine bacterial overgrowth. There is no bowel obstruction or active inflammation. Normal appendix. Vascular/Lymphatic: Moderate aortoiliac atherosclerotic disease. No portal venous gas. There is no adenopathy. Reproductive: The prostate and seminal vesicles  are grossly unremarkable. Other: None Musculoskeletal: Extensive osseous metastatic disease. Bilateral L5 pars defects. No acute osseous pathology. IMPRESSION: 1. Significant interval increase in the number and size of the hepatic metastatic disease compared to the prior CT. 2. Diffuse thickening of the gallbladder wall may be related to underlying systemic process or liver dysfunction. Further evaluation with ultrasound recommended. 3. Colonic diverticulosis. No bowel obstruction or active inflammation. Normal appendix. 4. Extensive osseous metastatic disease. Electronically Signed   By: Anner Crete M.D.   On: 02/07/2018 03:42    Procedures Procedures (including critical care time)  Medications Ordered in ED Medications  heparin lock flush 100 UNIT/ML injection (  Given 02/07/18 0114)  sodium chloride 0.9 % bolus 1,000 mL (0 mLs Intravenous Stopped 02/07/18 0316)  ondansetron (ZOFRAN) injection 4 mg (4 mg Intravenous Given 02/07/18 0158)  HYDROmorphone (DILAUDID) injection 1 mg (1 mg Intravenous Given 02/07/18 0157)  iopamidol (ISOVUE-300) 61 % injection 100 mL (100 mLs Intravenous Contrast Given 02/07/18 0241)  ondansetron (ZOFRAN) injection 4 mg (4 mg Intravenous Given 02/07/18 0300)  HYDROmorphone (DILAUDID) injection 1 mg (1 mg Intravenous Given 02/07/18 0306)  piperacillin-tazobactam (ZOSYN) IVPB 3.375 g (0 g Intravenous Stopped 02/07/18 0428)     Initial Impression / Assessment and Plan / ED Course  I have reviewed the triage vital signs and the nursing notes.  Pertinent labs & imaging results that were available during my care of the patient were reviewed by me and considered in my medical decision making (see chart for details).     Patient was given IV fluids and IV nausea and pain medication.  Laboratory testing was done and due to the bile colored vomitus and history of mets to his liver a CT of the abdomen was done.  I do realize he just had one in October but something different  seems to be going on, concern was also for some type of bowel obstruction.  Patient's pain and nausea medication had to be repeated.  After reviewing his CT scan IV antibiotics, Zosyn was ordered for possible cholecystitis, high risk due to age.  Ultrasound is not available at night.  I talked to the patient and his wife about admission and they are agreeable.  At this point I feel patient is a poor surgical candidate however he probably does need hospital admission to control his pain and nausea and to make sure he gets his antibiotics.  4:42 AM Dr. Alcario Drought, hospitalist will admit.  Final Clinical Impressions(s) / ED Diagnoses   Final diagnoses:  Cholecystitis  Bilious vomiting with nausea    Plan admission  Rolland Porter, MD, Barbette Or, MD 02/07/18 [phone]

## 2018-02-07 NOTE — Progress Notes (Signed)
Pharmacy Antibiotic Note  Micheal Davis is a 72 y.o. male admitted on 02/07/2018 with intra abdominal infection.  Pharmacy has been consulted for zosyn dosing.  Plan: Zosyn 3.375g IV q8h (4 hour infusion).  Height: 5\' 10"  (177.8 cm) Weight: 200 lb (90.7 kg) IBW/kg (Calculated) : 73  Temp (24hrs), Avg:97.9 F (36.6 C), Min:97.9 F (36.6 C), Max:97.9 F (36.6 C)  Recent Labs  Lab 02/07/18 0151  WBC 9.3  CREATININE 0.66    Estimated Creatinine Clearance: 93.2 mL/min (by C-G formula based on SCr of 0.66 mg/dL).    No Known Allergies Anti-infectives (From admission, onward)   Start     Dose/Rate Route Frequency Ordered Stop   02/07/18 1200  piperacillin-tazobactam (ZOSYN) IVPB 3.375 g     3.375 g 12.5 mL/hr over 240 Minutes Intravenous Every 8 hours 02/07/18 0737     02/07/18 0745  piperacillin-tazobactam (ZOSYN) IVPB 3.375 g  Status:  Discontinued     3.375 g 12.5 mL/hr over 240 Minutes Intravenous Every 8 hours 02/07/18 0737 02/07/18 0737   02/07/18 0400  piperacillin-tazobactam (ZOSYN) IVPB 3.375 g     3.375 g 100 mL/hr over 30 Minutes Intravenous  Once 02/07/18 0354 02/07/18 0428      Antimicrobials this admission: 12/8 zosyn >>    Microbiology results: No cultures at this time   Thank you for allowing pharmacy to be a part of this patient's care.  Donna Christen Cleve Paolillo 02/07/2018 7:44 AM

## 2018-02-07 NOTE — ED Triage Notes (Signed)
Pt is chemo pt, who's last treatment was Monday. Vomiting started yesterday. Has meds for N/V, but will not stay down.

## 2018-02-07 NOTE — Progress Notes (Signed)
[facility]  Patient seen. Full noted to follow. Patient with acute cholecystitis on [place]. Thickened gallbladder. Pain and nausea/vomiting for 2 weeks. Has metastatic prostate cancer to the liver with enlarging metastasis and bone.  H/o PE on Xarelto.    Tender upper abdomen R>L.   Plan for IV antibiotics, agree with zosyn. Do not think patient is a good candidate for laparoscopic cholecystectomy given his history of metastatic prostate cancer to the liver and his 2 week history of pain.   Recommend IV antibiotics only at this time.  If he does not improve or worsens, may have to consider a cholecystostomy tube by IR.  [name], MD [facility] [address] [place], [place] [phone] (office)

## 2018-02-07 NOTE — H&P (Signed)
History and Physical    Micheal Davis KXF:[id number] DOB: Sep 05, 1945 DOA: 02/07/2018  PCP: Micheal Labrum, MD  Patient coming from: Home  I have personally briefly reviewed patient's old medical records in [place]  Chief Complaint: N/V  HPI: Micheal Davis is a 72 y.o. 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  PO chemo agents for 2.5 years then switched to IV chemo for past 3 years.  Switched to new chemo agent, 3 treatments with that the most recent being Dec 2nd.  For the past couple of weeks he has been getting aching upper ABD pain.  Yesterday had onset of severe vomiting, bilious vomit.  Not helped by ODT zofran.  Persistent and unable to keep even water or home PO meds down.   ED Course: ALK 284, AST 84, ALT 34.  CT abd pelvis shows significant worsening of liver mets, also shows "Diffuse thickening of the gallbladder wall may be related to underlying systemic process or liver dysfunction. Further evaluation with ultrasound recommended."  Extensive bony mets, no SBO, no appendicitis.   Review of Systems: As per HPI otherwise 10 point review of systems negative.   Past Medical History:  Diagnosis Date  . Diabetes mellitus without complication ([place])   . Hypertension   . Prostate cancer ([place]) 09/05/2015  . Prostate cancer metastatic to bone ([place]) 09/05/2015  . Sleep apnea   . Thyroid disease     Past Surgical History:  Procedure Laterality Date  . CATARACT EXTRACTION    . PORTACATH PLACEMENT Left 12/31/2015   Procedure: INSERTION PORT-A-CATH LEFT SUBCLAVIAN;  Surgeon: Aviva Signs, MD;  Location: [facility];  Service: General;  Laterality: Left;  . REPLACEMENT TOTAL KNEE Left      reports that he has never smoked. He has never used smokeless tobacco. He reports that he drank alcohol. He reports that he does not use drugs.  No Known Allergies  Family History  Problem Relation Age of Onset  . Renal  Disease Father   . Vascular Disease Father        Carotid artery disease  . Hypertension Brother      Prior to Admission medications   Medication Sig Start Date End Date Taking? Authorizing Provider  atenolol (TENORMIN) 100 MG tablet Take 100 mg by mouth daily.    [provider]  atorvastatin (LIPITOR) 20 MG tablet Take 10 mg by mouth every morning.     [provider]  calcium carbonate (OS-CAL - DOSED IN MG OF ELEMENTAL CALCIUM) 1250 (500 Ca) MG tablet Take 1 tablet by mouth 2 (two) times daily.     [provider]  diltiazem (CARDIZEM CD) 300 MG 24 hr capsule Take 300 mg by mouth daily.    [provider]  Diphenhyd-Hydrocort-Nystatin (FIRST-DUKES MOUTHWASH) SUSP Use as directed 15 mLs in the mouth or throat 4 (four) times daily as needed. 12/28/17   Micheal Jack, MD  gabapentin (NEURONTIN) 300 MG capsule Patient may take 1 capsule in the morning and 2 capsules @ bedtime. 10/19/17   Davis, Micheal Lo, NP-C  Glucosamine-Chondroit-Vit C-Mn (GLUCOSAMINE 1500 COMPLEX PO) Take 1 tablet by mouth 2 (two) times daily.     [provider]  leuprolide (LUPRON) 7.5 MG injection Inject 7.5 mg into the muscle every 28 (twenty-eight) days.    [provider]  levothyroxine (SYNTHROID, LEVOTHROID) 25 MCG tablet Take 25 mcg by mouth daily before breakfast.  [provider]  loperamide (IMODIUM) 1 MG/5ML solution Take 1 mg by mouth as needed for diarrhea or loose stools.    [provider]  Lysine 500 MG CAPS Take 500 mg by mouth daily.    [provider]  metFORMIN (GLUCOPHAGE) 500 MG tablet Take 500 mg by mouth 2 (two) times daily with a meal.    [provider]  MITOXANTRONE HCL IV Inject into the vein every 21 ( twenty-one) days.    [provider]  morphine (MS CONTIN) 60 MG 12 hr tablet Take 1 tablet (60 mg total) by mouth every 12 (twelve) hours. 01/18/18   Davis, Micheal L, NP-C  morphine  (MSIR) 30 MG tablet Take 1 tablet (30 mg total) by mouth every 6 (six) hours as needed for severe pain. 01/18/18   Glennie Isle, NP-C  Multiple Vitamin (MULTIVITAMIN WITH MINERALS) TABS tablet Take 1 tablet by mouth daily.    [provider]  Omega-3 Fatty Acids (FISH OIL PO) Take 1 capsule by mouth daily. 03/21/16   [provider]  ondansetron (ZOFRAN-ODT) 8 MG disintegrating tablet Take 1 tablet (8 mg total) by mouth every 8 (eight) hours as needed for nausea or vomiting. 11/30/17   Davis, Micheal L, NP-C  potassium chloride SA (KLOR-CON M20) 20 MEQ tablet TAKE 20 MEQ BY MOUTH ONCE DAILY 11/10/17   Micheal Jack, MD  predniSONE (DELTASONE) 5 MG tablet Take 5 mg by mouth daily.     [provider]  predniSONE (DELTASONE) 5 MG tablet Take 1 tablet (5 mg total) by mouth 2 (two) times daily. 12/24/17   Micheal Jack, MD  rivaroxaban (XARELTO) 10 MG TABS tablet Take 1 tablet (10 mg total) by mouth daily. 01/19/18   Davis, Micheal L, NP-C  tamsulosin (FLOMAX) 0.4 MG CAPS capsule Take 0.8 mg by mouth every morning.     [provider]  triamterene-hydrochlorothiazide (MAXZIDE-25) 37.5-25 MG tablet Take 1 tablet by mouth daily. for high blood pressure 03/25/16   [provider]  prochlorperazine (COMPAZINE) 10 MG tablet Take 1 tablet (10 mg total) by mouth every 6 (six) hours as needed (Nausea or vomiting). Patient taking differently: Take 10 mg by mouth every 6 (six) hours as needed for nausea or vomiting.  01/16/16 08/27/17  Micheal Ranks, MD    Physical Exam: Vitals:   02/07/18 0330 02/07/18 0400 02/07/18 0415 02/07/18 0430  BP: 114/67 122/70  119/63  Pulse: (!) 56 (!) 57 (!) 56 (!) 57  Resp: '19 17 17 15  ' Temp:      TempSrc:      SpO2: 96%   96%  Weight:      Height:        Constitutional: NAD, calm, comfortable Eyes: PERRL, lids and conjunctivae normal ENMT: Mucous membranes are moist. Posterior pharynx clear of any exudate or  lesions.Normal dentition.  Neck: normal, supple, no masses, no thyromegaly Respiratory: clear to auscultation bilaterally, no wheezing, no crackles. Normal respiratory effort. No accessory muscle use.  Cardiovascular: Regular rate and rhythm, no murmurs / rubs / gallops. No extremity edema. 2+ pedal pulses. No carotid bruits.  Abdomen: no tenderness, no masses palpated. No hepatosplenomegaly. Bowel sounds positive.  Musculoskeletal: no clubbing / cyanosis. No joint deformity upper and lower extremities. Good ROM, no contractures. Normal muscle tone.  Skin: no rashes, lesions, ulcers. No induration Neurologic: CN 2-12 grossly intact. Sensation intact, DTR normal. Strength 5/5 in all 4.  Psychiatric: Normal judgment and insight. Alert and  oriented x 3. Normal mood.    Labs on Admission: I have personally reviewed following labs and imaging studies  CBC: Recent Labs  Lab 02/07/18 0151  WBC 9.3  NEUTROABS 7.9*  HGB 9.2*  HCT 30.3*  MCV 94.7  PLT 371   Basic Metabolic Panel: Recent Labs  Lab 02/07/18 0151  NA 134*  K 3.6  CL 101  CO2 22  GLUCOSE 173*  BUN 15  CREATININE 0.66  CALCIUM 8.4*   GFR: Estimated Creatinine Clearance: 93.2 mL/min (by C-G formula based on SCr of 0.66 mg/dL). Liver Function Tests: Recent Labs  Lab 02/07/18 0151  AST 84*  ALT 34  ALKPHOS 284*  BILITOT 0.7  PROT 6.5  ALBUMIN 3.1*   Recent Labs  Lab 02/07/18 0151  LIPASE 24   No results for input(s): AMMONIA in the last 168 hours. Coagulation Profile: No results for input(s): INR, PROTIME in the last 168 hours. Cardiac Enzymes: No results for input(s): CKTOTAL, CKMB, CKMBINDEX, TROPONINI in the last 168 hours. BNP (last 3 results) No results for input(s): PROBNP in the last 8760 hours. HbA1C: No results for input(s): HGBA1C in the last 72 hours. CBG: No results for input(s): GLUCAP in the last 168 hours. Lipid Profile: No results for input(s): CHOL, HDL, LDLCALC, TRIG, CHOLHDL,  LDLDIRECT in the last 72 hours. Thyroid Function Tests: No results for input(s): TSH, T4TOTAL, FREET4, T3FREE, THYROIDAB in the last 72 hours. Anemia Panel: No results for input(s): VITAMINB12, FOLATE, FERRITIN, TIBC, IRON, RETICCTPCT in the last 72 hours. Urine analysis:    Component Value Date/Time   COLORURINE YELLOW 02/07/2018 0308   APPEARANCEUR CLEAR 02/07/2018 0308   LABSPEC 1.018 02/07/2018 0308   PHURINE 7.0 02/07/2018 0308   GLUCOSEU NEGATIVE 02/07/2018 0308   HGBUR NEGATIVE 02/07/2018 0308   BILIRUBINUR NEGATIVE 02/07/2018 0308   KETONESUR NEGATIVE 02/07/2018 0308   PROTEINUR NEGATIVE 02/07/2018 0308   UROBILINOGEN 1.0 09/27/2010 1055   NITRITE NEGATIVE 02/07/2018 0308   LEUKOCYTESUR NEGATIVE 02/07/2018 0308    Radiological Exams on Admission: Mr Jeri Cos Wo Contrast  Result Date: 02/05/2018 CLINICAL DATA:  Metastatic prostate cancer. Right-sided headache over the last 3 weeks. Tingling and numbness of the left arm. EXAM: MRI HEAD WITHOUT AND WITH CONTRAST TECHNIQUE: Multiplanar, multiecho pulse sequences of the brain and surrounding structures were obtained without and with intravenous contrast. CONTRAST:  10 cc Gadavist COMPARISON:  Head CT 10/06/2017 and 10/20/2012. FINDINGS: Brain: Diffusion imaging does not show any acute or subacute infarction. There are mild chronic small-vessel ischemic changes of the cerebral hemispheric white matter. No evidence of metastatic disease. No hydrocephalus. There is been resorption of the previously seen right subdural hematoma. There is now only right-sided dural thickening measuring maximal about 3 mm. Mild dural thickening and enhancement seen elsewhere, reactive. No recurrent subdural hematoma. No mass effect or midline shift. Vascular: Major vessels at the base of the brain show flow. Skull and upper cervical spine: Widespread metastatic disease affecting the calvarium and the skull base as seen previously. No evidence of extraosseous  tumor. Sinuses/Orbits: Acute right maxillary sinusitis which could be symptomatic. Other sinuses are clear. Orbits negative. Other: None IMPRESSION: Acute right-sided maxillary sinusitis which could be a cause of pain. No acute brain finding. Mild chronic small-vessel change of the white matter. No evidence of metastatic disease to the brain. Resorption of the previously seen right subdural hematoma. Mild dural thickening on the right, maximal thickness 3 mm. No mass effect. Osseous metastatic disease affecting the  calvarium and the skull base. Electronically Signed   By: Nelson Chimes M.D.   On: 02/05/2018 14:06   Ct Abdomen Pelvis W Contrast  Result Date: 02/07/2018 CLINICAL DATA:  71 year old male with epigastric abdominal pain and vomiting. History of prostate cancer metastatic to bone. EXAM: CT ABDOMEN AND PELVIS WITH CONTRAST TECHNIQUE: Multidetector CT imaging of the abdomen and pelvis was performed using the standard protocol following bolus administration of intravenous contrast. CONTRAST:  130m ISOVUE-300 IOPAMIDOL (ISOVUE-300) INJECTION 61% COMPARISON:  CT of the abdomen pelvis dated 10/06/2017 FINDINGS: Lower chest: Minimal bibasilar atelectatic changes. There is mild cardiomegaly. No intra-abdominal free air or free fluid. Hepatobiliary: Multiple hepatic metastatic disease with the largest measuring approximately 9.1 x 9.7 cm (previously 5.1 x 5.5 cm) in segment IV. Significant interval increase in the number and size of the metastatic disease compared to the prior CT. There is diffuse thickening of the gallbladder wall which may be related to underlying systemic process or liver dysfunction. Further evaluation with ultrasound recommended to exclude acute cholecystitis. There is a noncalcified stone within the gallbladder. Pancreas: Unremarkable. No pancreatic ductal dilatation or surrounding inflammatory changes. Spleen: Normal in size without focal abnormality. Adrenals/Urinary Tract: The adrenal  glands are unremarkable. There is no hydronephrosis on either side. There is a 15 mm right renal upper pole cyst. Smaller left renal hypodense lesion is not characterized. The visualized ureters appear unremarkable. The urinary bladder is trabeculated. Stomach/Bowel: Scattered sigmoid diverticula without active inflammatory changes. Normal caliber fecalized loops of small bowel may represent increased transit time or small intestine bacterial overgrowth. There is no bowel obstruction or active inflammation. Normal appendix. Vascular/Lymphatic: Moderate aortoiliac atherosclerotic disease. No portal venous gas. There is no adenopathy. Reproductive: The prostate and seminal vesicles are grossly unremarkable. Other: None Musculoskeletal: Extensive osseous metastatic disease. Bilateral L5 pars defects. No acute osseous pathology. IMPRESSION: 1. Significant interval increase in the number and size of the hepatic metastatic disease compared to the prior CT. 2. Diffuse thickening of the gallbladder wall may be related to underlying systemic process or liver dysfunction. Further evaluation with ultrasound recommended. 3. Colonic diverticulosis. No bowel obstruction or active inflammation. Normal appendix. 4. Extensive osseous metastatic disease. Electronically Signed   By: AAnner CreteM.D.   On: 02/07/2018 03:42    EKG: Independently reviewed.  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 Intractable N/V - 1. Suspicion for acute cholecystitis 2. ddx includes just pain and N/V due to worsening metastatic liver dz. 3. Zosyn empirically 4. [place]Q ordered 1. Further consults (gen surg, heme/onc) TBD based on findings. 5. NPO except sips with meds 6. Dilaudid PRN pain 7. Continue long acting MS  contin 8. zofran PRN nausea (patient says the IV zofran in ED actually helped some despite the ODT not doing much) 9. Phenergan PRN breakthrough nausea 10. Repeat CMP tomorrow AM 11. IVF: NS at 125 cc/hr 2. H/o PE - 1. Holding Xarelto 2. Using heparin gtt instead 3. Chronic prednisone use - 1. Hold PO prednisone 2. Putting patient on stress dose solu-cortef 521mIV Q8H 4. Prostate CA with liver mets - 1. Worsening Liver mets noted on CT scan 2. RUQ [place].    DVT prophylaxis: heparin gtt Code Status: DNR confirmed with pt and family Family Communication:  Family at bedside Disposition Plan: Home after admit Consults called: None Admission status: Admit to inpatient  Severity of Illness: The appropriate patient status for this patient is INPATIENT. Inpatient status is judged to be reasonable and necessary in order to provide the required intensity of service to ensure the patient's safety. The patient's presenting symptoms, physical exam findings, and initial radiographic and laboratory data in the context of their chronic comorbidities is felt to place them at high risk for further clinical deterioration. Furthermore, it is not anticipated that the patient will be medically stable for discharge from the hospital within 2 midnights of admission. The following factors support the patient status of inpatient.   " The patient's presenting symptoms include Intractable nausea and vomiting, unable to keep even water down, RUQ pain. " The worrisome physical exam findings include RUQ TTP. " The initial radiographic and laboratory data are worrisome because of worsening liver mets, suggestion of acute cholecystitis on CT, cholelithiasis, ALK elevation. " The chronic co-morbidities include metastatic prostate cancer.   * I certify that at the point of admission it is my clinical judgment that the patient will require inpatient hospital care spanning beyond 2 midnights from the point of  admission due to high intensity of service, high risk for further deterioration and high frequency of surveillance required.Etta Quill DO [facility] Pager [phone] Only works nights!  If 7AM-7PM, please contact the primary day team physician taking care of patient  [email or web address] Password [email or web address]  02/07/2018, 5:13 AM

## 2018-02-08 ENCOUNTER — Ambulatory Visit (HOSPITAL_COMMUNITY): Payer: Medicare Other | Admitting: Hematology

## 2018-02-08 ENCOUNTER — Ambulatory Visit (HOSPITAL_COMMUNITY): Payer: Medicare Other

## 2018-02-08 ENCOUNTER — Other Ambulatory Visit (HOSPITAL_COMMUNITY): Payer: Medicare Other

## 2018-02-08 LAB — COMPREHENSIVE METABOLIC PANEL
ALT: 36 U/L (ref 0–44)
AST: 150 U/L — ABNORMAL HIGH (ref 15–41)
Albumin: 2.7 g/dL — ABNORMAL LOW (ref 3.5–5.0)
Alkaline Phosphatase: 277 U/L — ABNORMAL HIGH (ref 38–126)
Anion gap: 10 (ref 5–15)
BUN: 13 mg/dL (ref 8–23)
CO2: 26 mmol/L (ref 22–32)
CREATININE: 0.66 mg/dL (ref 0.61–1.24)
Calcium: 6.7 mg/dL — ABNORMAL LOW (ref 8.9–10.3)
Chloride: 102 mmol/L (ref 98–111)
GFR calc Af Amer: >60 mL/min (ref >60)
GFR calc non Af Amer: >60 mL/min (ref >60)
Glucose, Bld: 124 mg/dL — ABNORMAL HIGH (ref 70–99)
Potassium: 3.3 mmol/L — ABNORMAL LOW (ref 3.5–5.1)
Sodium: 138 mmol/L (ref 135–145)
Total Bilirubin: 0.9 mg/dL (ref 0.3–1.2)
Total Protein: 5.7 g/dL — ABNORMAL LOW (ref 6.5–8.1)

## 2018-02-08 LAB — CBC
HCT: 28.6 % — ABNORMAL LOW (ref 39.0–52.0)
Hemoglobin: 8.6 g/dL — ABNORMAL LOW (ref 13.0–17.0)
MCH: 28.7 pg (ref 26.0–34.0)
MCHC: 30.1 g/dL (ref 30.0–36.0)
MCV: 95.3 fL (ref 80.0–100.0)
Platelets: 210 10*3/uL (ref 150–400)
RBC: 3 MIL/uL — ABNORMAL LOW (ref 4.22–5.81)
RDW: 20.6 % — ABNORMAL HIGH (ref 11.5–15.5)
WBC: 5.8 10*3/uL (ref 4.0–10.5)
nRBC: 0.9 % — ABNORMAL HIGH (ref 0.0–0.2)

## 2018-02-08 MED ORDER — ENOXAPARIN SODIUM 60 MG/0.6ML ~~LOC~~ SOLN
50.0000 mg | Freq: Once | SUBCUTANEOUS | Status: AC
  Administered 2018-02-08: 50 mg via SUBCUTANEOUS
  Filled 2018-02-08: qty 0.6

## 2018-02-08 MED ORDER — BOOST / RESOURCE BREEZE PO LIQD CUSTOM
1.0000 | Freq: Three times a day (TID) | ORAL | Status: DC
  Administered 2018-02-09 – 2018-02-10 (×4): 1 via ORAL

## 2018-02-08 MED ORDER — POTASSIUM CHLORIDE CRYS ER 20 MEQ PO TBCR
40.0000 meq | EXTENDED_RELEASE_TABLET | Freq: Once | ORAL | Status: AC
  Administered 2018-02-08: 40 meq via ORAL
  Filled 2018-02-08: qty 2

## 2018-02-08 MED ORDER — ENOXAPARIN SODIUM 100 MG/ML ~~LOC~~ SOLN
90.0000 mg | Freq: Two times a day (BID) | SUBCUTANEOUS | Status: DC
  Administered 2018-02-08 – 2018-02-09 (×2): 90 mg via SUBCUTANEOUS
  Filled 2018-02-08 (×2): qty 1

## 2018-02-08 NOTE — Progress Notes (Signed)
[facility] Progress Note     Subjective: Says pain is much better. No nausea.   Objective: Vital signs in last 24 hours: Temp:  [97.6 F (36.4 C)-97.9 F (36.6 C)] 97.9 F (36.6 C) (12/09 0645) Pulse Rate:  [65-73] 70 (12/09 0645) Resp:  [16-18] 16 (12/09 0645) BP: (127-146)/(66-79) 127/66 (12/09 0645) SpO2:  [95 %-100 %] 95 % (12/09 0645) Last BM Date: 02/07/18  Intake/Output from previous day: 12/08 0701 - 12/09 0700 In: 118.2 [IV Piggyback:118.2] Out: 250 [Urine:250] Intake/Output this shift: No intake/output data recorded.  General appearance: alert, cooperative and no distress Resp: normal work breathing GI: soft, nondistended, minimally tender upper abdomen  Lab Results:  Recent Labs    02/07/18 0151 02/08/18 0640  WBC 9.3 5.8  HGB 9.2* 8.6*  HCT 30.3* 28.6*  PLT 233 210   BMET Recent Labs    02/07/18 0151 02/08/18 0640  NA 134* 138  K 3.6 3.3*  CL 101 102  CO2 22 26  GLUCOSE 173* 124*  BUN 15 13  CREATININE 0.66 0.66  CALCIUM 8.4* 6.7*     Anti-infectives: Anti-infectives (From admission, onward)   Start     Dose/Rate Route Frequency Ordered Stop   02/07/18 1200  piperacillin-tazobactam (ZOSYN) IVPB 3.375 g     3.375 g 12.5 mL/hr over 240 Minutes Intravenous Every 8 hours 02/07/18 0737     02/07/18 0745  piperacillin-tazobactam (ZOSYN) IVPB 3.375 g  Status:  Discontinued     3.375 g 12.5 mL/hr over 240 Minutes Intravenous Every 8 hours 02/07/18 0737 02/07/18 0737   02/07/18 0400  piperacillin-tazobactam (ZOSYN) IVPB 3.375 g     3.375 g 100 mL/hr over 30 Minutes Intravenous  Once 02/07/18 0354 02/07/18 0428      Assessment/Plan: Micheal Davis is a 72 yo with acute cholecystitis in the setting of metastatic prostate cancer with liver metastasis on chemotherapy and a history of a PE on anticoagulation. He has had pain for 2 weeks. He is not a candidate for surgery given the above.  -IV antibiotics -Clear liquids and adv as  tolerated -If worsens will possibly need cholecystostomy tube.    LOS: 1 day    Micheal Davis 02/08/2018

## 2018-02-08 NOTE — Progress Notes (Signed)
ANTICOAGULATION CONSULT NOTE - Initial Consult  Pharmacy Consult for lovenox Indication: pulmonary embolus  No Known Allergies  Patient Measurements: Height: 5\' 10"  (177.8 cm) Weight: 199 lb 11.8 oz (90.6 kg) IBW/kg (Calculated) : 73  Vital Signs: Temp: 97.9 F (36.6 C) (12/09 0645) Temp Source: Oral (12/09 0645) BP: 127/66 (12/09 0645) Pulse Rate: 70 (12/09 0645)  Labs: Recent Labs    02/07/18 0151 02/07/18 0547 02/08/18 0640  HGB 9.2*  --  8.6*  HCT 30.3*  --  28.6*  PLT 233  --  210  APTT  --  27  --   HEPARINUNFRC  --  1.08*  --   CREATININE 0.66  --  0.66    Estimated Creatinine Clearance: 93.1 mL/min (by C-G formula based on SCr of 0.66 mg/dL).   Medical History: Past Medical History:  Diagnosis Date  . Diabetes mellitus without complication ([place])   . Hypertension   . Prostate cancer ([place]) 09/05/2015  . Prostate cancer metastatic to bone ([place]) 09/05/2015  . Sleep apnea   . Thyroid disease     Medications:  Medications Prior to Admission  Medication Sig Dispense Refill Last Dose  . aspirin EC 81 MG tablet Take 81 mg by mouth daily.   02/05/2018  . atenolol (TENORMIN) 100 MG tablet Take 100 mg by mouth daily.   02/05/2018 at 0700am  . atorvastatin (LIPITOR) 20 MG tablet Take 10 mg by mouth every morning.    02/05/2018  . Calcium Carb-Cholecalciferol (CALCIUM PLUS VITAMIN D3) 600-500 MG-UNIT CAPS Take 1,200 mg by mouth daily.   02/05/2018  . chlorhexidine (PERIDEX) 0.12 % solution Use as directed 15 mLs in the mouth or throat 2 (two) times daily.   Past Month at Unknown time  . cyclobenzaprine (FLEXERIL) 10 MG tablet Take 10 mg by mouth 3 (three) times daily as needed for muscle spasms.   02/05/2018  . diltiazem (CARDIZEM CD) 300 MG 24 hr capsule Take 300 mg by mouth daily.   02/05/2018  . Diphenhyd-Hydrocort-Nystatin (FIRST-DUKES MOUTHWASH) SUSP Use as directed 15 mLs in the mouth or throat 4 (four) times daily as needed. 300 mL 1 Past Week at Unknown time  .  gabapentin (NEURONTIN) 300 MG capsule Patient may take 1 capsule in the morning and 2 capsules @ bedtime. (Patient taking differently: Take 300 mg by mouth 2 (two) times daily. Patient may take 1 capsule in the morning and 2 capsules @ bedtime.) 90 capsule 3 02/05/2018  . Glucosamine-Chondroit-Vit C-Mn (GLUCOSAMINE 1500 COMPLEX PO) Take 1 tablet by mouth 2 (two) times daily.    02/05/2018  . leuprolide (LUPRON) 7.5 MG injection Inject 7.5 mg into the muscle every 28 (twenty-eight) days.   october  . levothyroxine (SYNTHROID, LEVOTHROID) 25 MCG tablet Take 25 mcg by mouth daily before breakfast.   02/07/2018 at Unknown time  . loperamide (IMODIUM) 1 MG/5ML solution Take 1 mg by mouth as needed for diarrhea or loose stools.   unknown  . metFORMIN (GLUCOPHAGE) 500 MG tablet Take 500 mg by mouth 2 (two) times daily with a meal.   02/05/2018  . morphine (MS CONTIN) 60 MG 12 hr tablet Take 1 tablet (60 mg total) by mouth every 12 (twelve) hours. 60 tablet 0 02/04/2018  . morphine (MSIR) 30 MG tablet Take 1 tablet (30 mg total) by mouth every 6 (six) hours as needed for severe pain. 60 tablet 0 02/04/2018  . Multiple Vitamin (MULTIVITAMIN WITH MINERALS) TABS tablet Take 1 tablet by mouth daily.  02/05/2018  . Omega-3 Fatty Acids (FISH OIL PO) Take 1 capsule by mouth daily.   02/05/2018  . ondansetron (ZOFRAN-ODT) 8 MG disintegrating tablet Take 1 tablet (8 mg total) by mouth every 8 (eight) hours as needed for nausea or vomiting. 60 tablet 1 02/06/2018  . potassium chloride SA (KLOR-CON M20) 20 MEQ tablet TAKE 20 MEQ BY MOUTH ONCE DAILY (Patient taking differently: Take 20 mEq by mouth daily. TAKE 20 MEQ BY MOUTH ONCE DAILY) 90 tablet 1 02/05/2018  . predniSONE (DELTASONE) 5 MG tablet Take 1 tablet (5 mg total) by mouth 2 (two) times daily. (Patient taking differently: Take 7.5 mg by mouth daily. ) 62 tablet 4 02/05/2018  . rivaroxaban (XARELTO) 10 MG TABS tablet Take 1 tablet (10 mg total) by mouth daily. 30 tablet 3  02/06/2018 at 0700am  . tamsulosin (FLOMAX) 0.4 MG CAPS capsule Take 0.8 mg by mouth every morning.    02/05/2018  . traMADol (ULTRAM) 50 MG tablet Take 50 mg by mouth daily.   02/05/2018  . vitamin E 400 UNIT capsule Take 800 Units by mouth daily.   02/05/2018  . triamterene-hydrochlorothiazide (MAXZIDE-25) 37.5-25 MG tablet Take 1 tablet by mouth daily. for high blood pressure  3 02/05/2018    Assessment: 72 yo male admitted with intractable Nausea and vomiting likely secondary to acute cholecysitis. Patient has h/o PE and on chronic xarelto. Last dose given 02/06/18. Xarelto on hold until definitive surgical plans. Pharmacy asked to dose with Lovenox. Lovenox 40mg  sq given this AM, will give additional 50mg  now, then 1mg  /kg sq q12h.  Goal of Therapy:  Monitor platelets by anticoagulation protocol: Yes   Plan:  Lovenox 1mg /kg (90mg ) sq q12h Monitor for S/S of bleeding F/U transition back to [place], BS Pharm D, BCPS Clinical Pharmacist Pager [phone] 02/08/2018,11:47 AM

## 2018-02-08 NOTE — Progress Notes (Signed)
PROGRESS NOTE    Micheal Davis  ENI:[id number] DOB: 06-08-1945 DOA: 02/07/2018 PCP: [name], MD   Brief Narrative:  Per HPI from Dr. Alcario Drought: Micheal Davis is a 71 y.o. 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  PO chemo agents for 2.5 years then switched to IV chemo for past 3 years.  Switched to new chemo agent, 3 treatments with that the most recent being Dec 2nd.  For the past couple of weeks he has been getting aching upper ABD pain.  Yesterday had onset of severe vomiting, bilious vomit.  Not helped by ODT zofran.  Persistent and unable to keep even water or home PO meds down.  Patient has been admitted for intractable nausea and vomiting likely related to acute cholecystitis.   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 Intractable nausea and vomiting likely secondary to acute cholecystitis.  Continue empiric IV Zosyn as he appears to be clinically improving.  Advance diet to clear liquids today.  Dilaudid as needed for pain and Zofran as needed for nausea.  Repeat CMP in a.m.  Appreciate general surgery recommendations. 2. History of PE.  With holding Xarelto for now with ongoing full dose Lovenox.  Plan to transition off and back to Xarelto if there is no plans for procedures by a.m. 3. Chronic prednisone use.  Hold p.o. prednisone with ongoing IV Solu-Cortef every 8 hours.  This will be until tomorrow if he is tolerating diet. 4. Prostate cancer with liver mets.  Worsening liver mets noted on CT scan.  Right upper quadrant ultrasound with multiple gallstones but no biliary dilatation.   DVT prophylaxis: Full dose Lovenox Code Status: DNR Family Communication: Wife at  bedside Disposition Plan: Continue to monitor for symptomatic improvement with IV Zosyn.  Appreciate general surgery recommendations.  Advance diet today.   Consultants:   General surgery  Procedures:   None  Antimicrobials:   IV Zosyn 12/8->   Subjective: Patient seen and evaluated today with no new acute complaints or concerns. No acute concerns or events noted overnight.  He states that he is having no further abdominal pain and much less nausea and vomiting.  He did have a bowel movement last night.  He is hungry and wanting to have a diet.  Objective: Vitals:   02/07/18 1151 02/07/18 1412 02/07/18 2227 02/08/18 0645  BP: 139/67 137/72 (!) 146/79 127/66  Pulse: 70 65 73 70  Resp: 19 18  16   Temp: 97.7 F (36.5 C) 97.9 F (36.6 C) 97.6 F (36.4 C) 97.9 F (36.6 C)  TempSrc: Oral Oral Oral Oral  SpO2: 100% 100% 96% 95%  Weight: 90.6 kg     Height: 5\' 10"  (1.778 m)       Intake/Output Summary (Last 24 hours) at 02/08/2018 1128 Last data filed at 02/08/2018 0900 Gross per 24 hour  Intake 118.22 ml  Output -  Net 118.22 ml   Filed Weights   02/07/18 0053 02/07/18 1151  Weight: 90.7 kg 90.6 kg    Examination:  General exam: Appears calm and comfortable  Respiratory system: Clear to auscultation. Respiratory effort normal. Cardiovascular system: S1 & S2 heard, RRR. No JVD, murmurs, rubs, gallops or clicks.  No pedal edema. Gastrointestinal system: Abdomen is nondistended, soft and nontender. No organomegaly or masses felt. Normal bowel sounds heard. Central nervous system: Alert and oriented. No focal neurological deficits. Extremities: Symmetric 5 x 5 power. Skin: No rashes, lesions or ulcers Psychiatry: Judgement and insight appear normal. Mood & affect appropriate.     Data Reviewed: I have personally reviewed following labs and imaging studies  CBC: Recent Labs  Lab 02/07/18 0151 02/08/18 0640  WBC 9.3 5.8  NEUTROABS 7.9*  --   HGB 9.2* 8.6*  HCT  30.3* 28.6*  MCV 94.7 95.3  PLT 233 024   Basic Metabolic Panel: Recent Labs  Lab 02/07/18 0151 02/08/18 0640  NA 134* 138  K 3.6 3.3*  CL 101 102  CO2 22 26  GLUCOSE 173* 124*  BUN 15 13  CREATININE 0.66 0.66  CALCIUM 8.4* 6.7*   GFR: Estimated Creatinine Clearance: 93.1 mL/min (by C-G formula based on SCr of 0.66 mg/dL). Liver Function Tests: Recent Labs  Lab 02/07/18 0151 02/08/18 0640  AST 84* 150*  ALT 34 36  ALKPHOS 284* 277*  BILITOT 0.7 0.9  PROT 6.5 5.7*  ALBUMIN 3.1* 2.7*   Recent Labs  Lab 02/07/18 0151  LIPASE 24   No results for input(s): AMMONIA in the last 168 hours. Coagulation Profile: No results for input(s): INR, PROTIME in the last 168 hours. Cardiac Enzymes: No results for input(s): CKTOTAL, CKMB, CKMBINDEX, TROPONINI in the last 168 hours. BNP (last 3 results) No results for input(s): PROBNP in the last 8760 hours. HbA1C: No results for input(s): HGBA1C in the last 72 hours. CBG: No results for input(s): GLUCAP in the last 168 hours. Lipid Profile: No results for input(s): CHOL, HDL, LDLCALC, TRIG, CHOLHDL, LDLDIRECT in the last 72 hours. Thyroid Function Tests: No results for input(s): TSH, T4TOTAL, FREET4, T3FREE, THYROIDAB in the last 72 hours. Anemia Panel: No results for input(s): VITAMINB12, FOLATE, FERRITIN, TIBC, IRON, RETICCTPCT in the last 72 hours. Sepsis Labs: No results for input(s): PROCALCITON, LATICACIDVEN in the last 168 hours.  No results found for this or any previous visit (from the past 240 hour(s)).       Radiology Studies: Ct Abdomen Pelvis W Contrast  Result Date: 02/07/2018 CLINICAL DATA:  72 year old male with epigastric abdominal pain and vomiting. History of prostate cancer metastatic to bone. EXAM: CT ABDOMEN AND PELVIS WITH CONTRAST TECHNIQUE: Multidetector CT imaging of the abdomen and pelvis was performed using the standard protocol following bolus administration of intravenous contrast. CONTRAST:   157mL ISOVUE-300 IOPAMIDOL (ISOVUE-300) INJECTION 61% COMPARISON:  CT of the abdomen pelvis dated 10/06/2017 FINDINGS: Lower chest: Minimal bibasilar atelectatic changes. There is mild cardiomegaly. No intra-abdominal free air or free fluid. Hepatobiliary: Multiple hepatic metastatic disease with the largest measuring approximately 9.1 x 9.7 cm (previously 5.1 x 5.5 cm) in segment IV. Significant interval increase in the number and size of the metastatic disease compared to the prior CT. There is diffuse thickening of the gallbladder wall which may be related to underlying systemic process or liver dysfunction. Further evaluation with ultrasound recommended to exclude acute cholecystitis. There is a noncalcified stone within the gallbladder. Pancreas: Unremarkable. No pancreatic ductal dilatation or surrounding inflammatory changes. Spleen: Normal in size without focal abnormality. Adrenals/Urinary Tract: The adrenal glands are unremarkable. There is no hydronephrosis on either side. There is a 15 mm right renal upper pole cyst. Smaller left renal hypodense lesion is not characterized. The visualized ureters appear unremarkable. The urinary bladder is trabeculated.  Stomach/Bowel: Scattered sigmoid diverticula without active inflammatory changes. Normal caliber fecalized loops of small bowel may represent increased transit time or small intestine bacterial overgrowth. There is no bowel obstruction or active inflammation. Normal appendix. Vascular/Lymphatic: Moderate aortoiliac atherosclerotic disease. No portal venous gas. There is no adenopathy. Reproductive: The prostate and seminal vesicles are grossly unremarkable. Other: None Musculoskeletal: Extensive osseous metastatic disease. Bilateral L5 pars defects. No acute osseous pathology. IMPRESSION: 1. Significant interval increase in the number and size of the hepatic metastatic disease compared to the prior CT. 2. Diffuse thickening of the gallbladder wall may be  related to underlying systemic process or liver dysfunction. Further evaluation with ultrasound recommended. 3. Colonic diverticulosis. No bowel obstruction or active inflammation. Normal appendix. 4. Extensive osseous metastatic disease. Electronically Signed   By: Anner Crete M.D.   On: 02/07/2018 03:42   US Abdomen Limited Ruq  Result Date: 02/07/2018 CLINICAL DATA:  Epigastric pain and vomiting for 2 weeks. Cholelithiasis. Metastatic prostate carcinoma. EXAM: ULTRASOUND ABDOMEN LIMITED RIGHT UPPER QUADRANT COMPARISON:  CT on 02/07/2018 FINDINGS: Gallbladder: Multiple gallstones are seen measuring up to 2 cm. Diffuse gallbladder wall thickening is seen measuring up to 12 mm. Positive sonographic Murphy sign noted by sonographer. These findings are consistent with acute cholecystitis. Common bile duct: Diameter: 4 mm, within normal limits. Liver: Multiple solid hepatic masses are again seen both the right and left lobes, largest measuring approximately 10 cm, consistent with diffuse liver metastases. Portal vein is patent on color Doppler imaging with normal direction of blood flow towards the liver. IMPRESSION: Findings consistent with acute cholecystitis. No evidence of biliary ductal dilatation. Diffuse liver metastases. Electronically Signed   By: Earle Gell M.D.   On: 02/07/2018 09:30        Scheduled Meds: . enoxaparin (LOVENOX) injection  40 mg Subcutaneous Q24H  . gabapentin  300 mg Oral Daily  . gabapentin  600 mg Oral QHS  . hydrocortisone sod succinate (SOLU-CORTEF) inj  50 mg Intravenous Q8H  . levothyroxine  25 mcg Oral QAC breakfast  . morphine  60 mg Oral Q12H  . tamsulosin  0.8 mg Oral q morning - 10a   Continuous Infusions: . sodium chloride 500 mL (02/07/18 2308)  . piperacillin-tazobactam (ZOSYN)  IV 3.375 g (02/08/18 0629)     LOS: 1 day    Time spent: 30 minutes    Talecia Sherlin Darleen Crocker, DO [facility] Pager [phone]  If 7PM-7AM, please contact  night-coverage [email or web address] Password [email or web address] 02/08/2018, 11:28 AM

## 2018-02-08 NOTE — Plan of Care (Signed)

## 2018-02-08 NOTE — Progress Notes (Deleted)
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   CURRENT THERAPY: Mitoxantrone every 3 weeks  BRIEF ONCOLOGIC HISTORY:    Prostate cancer metastatic to bone ([place])   10/01/2012 Initial Diagnosis    Prostate biopsied with highest Gleason score of 9 seen and the lowest score was 7.    10/04/2012 - 05/16/2013 Chemotherapy    Depo-Lupron and Casodex initiated    05/16/2013 -  Chemotherapy    Depo-Lupron monthly continued    05/16/2013 Progression    Progression by PSA elevation    05/16/2013 - 10/22/2014 Chemotherapy    Abiraterone and prednisone initiated in conjunction with ongoing Depo-Lupron.  Denosumab also ongoing.    10/23/2014 Progression    PSA increasing from 0.2- 1.6 in less than 6 months.      10/23/2014 - 01/30/2015 Chemotherapy    Enzalutamide and Prednisone (5 mg in AM and 2.5 mg in PM)    01/30/2015 Imaging    Bone scan- New focus of intense activity in right proximal humerus.  Interim increase in activity over left hip.    01/30/2015 Progression    Bone scan reveals new disease in right humerus consistent with progression of disease    01/31/2015 Imaging    Right humerus xray- blastic foci in proximal right humeral metaphysis and over right mid-humeral diaphysis.  No evidence of fracture    07/06/2015 Progression    Progression in multiple bones especially L hip and femurs    07/06/2015 Imaging    Bone scan- progressive multifocal osseous metastases in the right proximal femora and distal femoral shafts.  Stable update in bilateral ribs suspicious for small rib metastases    07/16/2015 - 07/31/2015 Radiation Therapy    Left femur 30 Gy in 10 fractions by Dr. Tammi Klippel    11/23/2015 - 01/04/2016 Chemotherapy    The patient had pegfilgrastim (NEULASTA ONPRO KIT)  injection 6 mg, 6 mg, Subcutaneous, Once, 3 of 7 cycles  DOCEtaxel (TAXOTERE) 180 mg in dextrose 5 % 250 mL chemo infusion, 75 mg/m2 = 180 mg, Intravenous,  Once, 3 of 7 cycles Dose modification: 64 mg/m2 (original dose 75 mg/m2, Cycle 2, Reason: Dose not tolerated)  pegfilgrastim (NEULASTA ONPRO KIT) injection 6 mg, 6 mg, Subcutaneous, Once, 0 of 4 cycles  cabazitaxel (JEVTANA) 60 mg in dextrose 5 % 250 mL chemo infusion, 25 mg/m2, Intravenous,  Once, 0 of 4 cycles  for chemotherapy treatment.      11/30/2015 Adverse Reaction    Diarrhea (secondary to chemotherapy) and dehydration requiring IV fluids    12/14/2015 Treatment Plan Change    Docetaxel dose reduced by 15%    12/31/2015 Procedure    Port placed by Dr. Arnoldo Morale    01/04/2016 Progression    Rising PSA    01/28/2016 - 04/29/2017 Chemotherapy    Cabazitaxel (Jevtana)     03/27/2016 Imaging    CT Chest, Abdomen, and Pelvis with contrast 1. Diffuse sclerotic osseous metastatic disease in the chest, abdomen, and pelvis without acute fracture identified. 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 These appear chronic. 2. The prostate gland is normal in size and no adenopathy is currently identified. 3. On a prior MRI of 10/04/2012, there was a posterior right hepatic lobe lesion. This lesion is  not currently visible on today' s CT. This could be due to differences in cons acuity between CT or MRI, or resolution of the lesion. 4. Coronary, aortic arch, and branch vessel atherosclerotic vascular disease. Aortoiliac atherosclerotic vascular disease. 5. Single bilateral renal cysts.    04/16/2016 Imaging    Bone scan- Findings consist with progressive metastatic disease. Activity over the proximal right humerus and proximal bilateral femurs are worrisome for the possible development of pathologic fractures.    08/21/2016 Imaging    CT C/A/P: IMPRESSION: No significant change since 03/27/2016  CT. Diffuse bony metastases without other evidence of metastatic disease.    08/21/2016 Imaging    Bone Scan: IMPRESSION: Multiple areas of increased activity again noted throughout the axial and appendicular skeleton in similar locations as prior exam. Intensity of uptake is increased from prior exam suggesting progressive disease. Lesions present in the proximal humeri, proximal femurs, and the mid right femur susceptible to pathologic fracture.    12/02/2016 Imaging    CT C/A/P: IMPRESSION: 1. Overall stable appearance of diffuse osseous metastatic disease and resulting patchy sclerosis. 2. The patient had a posterior right hepatic lobe lesion on prior MRI from 2014 which is been relatively occult on CT. Given the lack of progression I suspect that this is benign or has been effectively treated. 3. Aortic Atherosclerosis (ICD10-I70.0). Coronary atherosclerosis with mild cardiomegaly. 4. Cholelithiasis. 5. Bilateral benign renal cysts. 6. 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    12/02/2016 Imaging    Bone Scan: IMPRESSION: 1. Widespread osseous metastatic disease. Multiplicity is similar to previous exam with interval increase in degree of tracer uptake associated with multiple lesions.    03/11/2017 Imaging    Bone Scan: Multifocal skeletal disease without sign of progression CT C/A/P: Diffuse sclerotic skeletal lesions, unchanged from the previous.  No new lymphadenopathy, pulmonary nodules, or hepatic lesions to suggest soft tissue progressive disease    06/25/2017 -  Chemotherapy    Docetaxel    12/22/2017 -  Chemotherapy    The patient had pegfilgrastim (NEULASTA ONPRO KIT) injection 6 mg, 6 mg, Subcutaneous, Once, 2 of 6 cycles Administration: 6 mg (12/28/2017), 6 mg (01/18/2018) ondansetron  (ZOFRAN) 8 mg in sodium chloride 0.9 % 50 mL IVPB, 8 mg (100 % of original dose 8 mg), Intravenous,  Once, 1 of 1 cycle Dose modification: 8 mg (original dose 8 mg, Cycle 1) ondansetron (ZOFRAN) 8 mg, dexamethasone (DECADRON) 10 mg in sodium chloride 0.9 % 50 mL IVPB, , Intravenous,  Once, 2 of 6 cycles Administration: 8 mg (12/28/2017), 8 mg (01/18/2018) mitoXANtrone (NOVANTRONE) 26 mg in sodium chloride 0.9 % 50 mL chemo infusion, 12 mg/m2 = 26 mg, Intravenous,  Once, 2 of 6 cycles Administration: 26 mg (12/28/2017), 26 mg (01/18/2018)  for chemotherapy treatment.       CANCER STAGING: Cancer Staging Prostate cancer metastatic to bone [facility]) Staging form: Prostate, AJCC 7th Edition - Clinical: No stage assigned - Unsigned    INTERVAL HISTORY:  Micheal Davis 72 y.o. 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     REVIEW OF SYSTEMS:  Review of Systems - Oncology   PAST MEDICAL/SURGICAL HISTORY:  Past Medical History:  Diagnosis Date  . Diabetes mellitus without complication ([place])   . Hypertension   . Prostate cancer ([place]) 09/05/2015  . Prostate  cancer metastatic to bone ([place]) 09/05/2015  . Sleep apnea   . Thyroid disease    Past Surgical History:  Procedure Laterality Date  . CATARACT EXTRACTION    . PORTACATH PLACEMENT Left 12/31/2015   Procedure: INSERTION PORT-A-CATH LEFT SUBCLAVIAN;  Surgeon: Aviva Signs, MD;  Location: [facility];  Service: General;  Laterality: Left;  . REPLACEMENT TOTAL KNEE Left      SOCIAL HISTORY:  Social History   Socioeconomic History  . Marital status: Married    Spouse name: Not on file  . Number of children: Not on file  . Years of education: Not on file  . Highest education level: Not on file  Occupational History  . Not on file  Social Needs  . Financial resource strain: Not hard at all  . Food insecurity:    Worry: Never true    Inability: Never true  . Transportation needs:    Medical: No     Non-medical: No  Tobacco Use  . Smoking status: Never Smoker  . Smokeless tobacco: Never Used  Substance and Sexual Activity  . Alcohol use: Not Currently    Comment: 1 beer each month  . Drug use: No  . Sexual activity: Never    Comment: married  Lifestyle  . Physical activity:    Days per week: 0 days    Minutes per session: 0 min  . Stress: To some extent  Relationships  . Social connections:    Talks on phone: More than three times a week    Gets together: Three times a week    Attends religious service: 1 to 4 times per year    Active member of club or organization: Yes    Attends meetings of clubs or organizations: 1 to 4 times per year    Relationship status: Married  . Intimate partner violence:    Fear of current or ex partner: Not on file    Emotionally abused: Not on file    Physically abused: Not on file    Forced sexual activity: Not on file  Other Topics Concern  . Not on file  Social History Narrative  . Not on file    FAMILY HISTORY:  Family History  Problem Relation Age of Onset  . Renal Disease Father   . Vascular Disease Father        Carotid artery disease  . Hypertension Brother     CURRENT MEDICATIONS:  Facility-Administered Encounter Medications as of 02/08/2018  Medication  . 0.9 %  sodium chloride infusion  . acetaminophen (TYLENOL) tablet 650 mg   Or  . acetaminophen (TYLENOL) suppository 650 mg  . enoxaparin (LOVENOX) injection 40 mg  . gabapentin (NEURONTIN) capsule 300 mg  . gabapentin (NEURONTIN) capsule 600 mg  . hydrocortisone sodium succinate (SOLU-CORTEF) 100 MG injection 50 mg  . HYDROmorphone (DILAUDID) injection 1 mg  . leuprolide (LUPRON) injection 7.5 mg  . levothyroxine (SYNTHROID, LEVOTHROID) tablet 25 mcg  . morphine (MS CONTIN) 12 hr tablet 60 mg  . ondansetron (ZOFRAN) injection 4 mg  . piperacillin-tazobactam (ZOSYN) IVPB 3.375 g  . potassium chloride SA (K-DUR,KLOR-CON) CR tablet 40 mEq  . promethazine  (PHENERGAN) injection 12.5 mg  . tamsulosin (FLOMAX) capsule 0.8 mg   Outpatient Encounter Medications as of 02/08/2018  Medication Sig  . aspirin EC 81 MG tablet Take 81 mg by mouth daily.  Marland Kitchen atenolol (TENORMIN) 100 MG tablet Take 100 mg by mouth daily.  Marland Kitchen atorvastatin (LIPITOR) 20 MG tablet  Take 10 mg by mouth every morning.   . Calcium Carb-Cholecalciferol (CALCIUM PLUS VITAMIN D3) 600-500 MG-UNIT CAPS Take 1,200 mg by mouth daily.  . chlorhexidine (PERIDEX) 0.12 % solution Use as directed 15 mLs in the mouth or throat 2 (two) times daily.  . cyclobenzaprine (FLEXERIL) 10 MG tablet Take 10 mg by mouth 3 (three) times daily as needed for muscle spasms.  Marland Kitchen diltiazem (CARDIZEM CD) 300 MG 24 hr capsule Take 300 mg by mouth daily.  . Diphenhyd-Hydrocort-Nystatin (FIRST-DUKES MOUTHWASH) SUSP Use as directed 15 mLs in the mouth or throat 4 (four) times daily as needed.  . gabapentin (NEURONTIN) 300 MG capsule Patient may take 1 capsule in the morning and 2 capsules @ bedtime. (Patient taking differently: Take 300 mg by mouth 2 (two) times daily. Patient may take 1 capsule in the morning and 2 capsules @ bedtime.)  . Glucosamine-Chondroit-Vit C-Mn (GLUCOSAMINE 1500 COMPLEX PO) Take 1 tablet by mouth 2 (two) times daily.   Marland Kitchen leuprolide (LUPRON) 7.5 MG injection Inject 7.5 mg into the muscle every 28 (twenty-eight) days.  Marland Kitchen levothyroxine (SYNTHROID, LEVOTHROID) 25 MCG tablet Take 25 mcg by mouth daily before breakfast.  . loperamide (IMODIUM) 1 MG/5ML solution Take 1 mg by mouth as needed for diarrhea or loose stools.  . metFORMIN (GLUCOPHAGE) 500 MG tablet Take 500 mg by mouth 2 (two) times daily with a meal.  . morphine (MS CONTIN) 60 MG 12 hr tablet Take 1 tablet (60 mg total) by mouth every 12 (twelve) hours.  Marland Kitchen morphine (MSIR) 30 MG tablet Take 1 tablet (30 mg total) by mouth every 6 (six) hours as needed for severe pain.  . Multiple Vitamin (MULTIVITAMIN WITH MINERALS) TABS tablet Take 1 tablet by  mouth daily.  . Omega-3 Fatty Acids (FISH OIL PO) Take 1 capsule by mouth daily.  . ondansetron (ZOFRAN-ODT) 8 MG disintegrating tablet Take 1 tablet (8 mg total) by mouth every 8 (eight) hours as needed for nausea or vomiting.  . potassium chloride SA (KLOR-CON M20) 20 MEQ tablet TAKE 20 MEQ BY MOUTH ONCE DAILY (Patient taking differently: Take 20 mEq by mouth daily. TAKE 20 MEQ BY MOUTH ONCE DAILY)  . predniSONE (DELTASONE) 5 MG tablet Take 1 tablet (5 mg total) by mouth 2 (two) times daily. (Patient taking differently: Take 7.5 mg by mouth daily. )  . rivaroxaban (XARELTO) 10 MG TABS tablet Take 1 tablet (10 mg total) by mouth daily.  . tamsulosin (FLOMAX) 0.4 MG CAPS capsule Take 0.8 mg by mouth every morning.   . traMADol (ULTRAM) 50 MG tablet Take 50 mg by mouth daily.  Marland Kitchen triamterene-hydrochlorothiazide (MAXZIDE-25) 37.5-25 MG tablet Take 1 tablet by mouth daily. for high blood pressure  . vitamin E 400 UNIT capsule Take 800 Units by mouth daily.  . [DISCONTINUED] prochlorperazine (COMPAZINE) 10 MG tablet Take 1 tablet (10 mg total) by mouth every 6 (six) hours as needed (Nausea or vomiting). (Patient taking differently: Take 10 mg by mouth every 6 (six) hours as needed for nausea or vomiting. )    ALLERGIES:  No Known Allergies   PHYSICAL EXAM:  ECOG Performance status: 1  There were no vitals filed for this visit. There were no vitals filed for this visit.  Physical Exam   LABORATORY DATA:  I have reviewed the labs as listed.  CBC    Component Value Date/Time   WBC 5.8 02/08/2018 0640   RBC 3.00 (L) 02/08/2018 0640   HGB 8.6 (L) 02/08/2018 2111  HCT 28.6 (L) 02/08/2018 0640   PLT 210 02/08/2018 0640   MCV 95.3 02/08/2018 0640   MCH 28.7 02/08/2018 0640   MCHC 30.1 02/08/2018 0640   RDW 20.6 (H) 02/08/2018 0640   LYMPHSABS 0.5 (L) 02/07/2018 0151   MONOABS 0.7 02/07/2018 0151   EOSABS 0.0 02/07/2018 0151   BASOSABS 0.0 02/07/2018 0151   CMP Latest Ref Rng & Units  02/08/2018 02/07/2018 01/18/2018  Glucose 70 - 99 mg/dL 124(H) 173(H) 210(H)  BUN 8 - 23 mg/dL _0 Creatinine 0.61 - 1.24 mg/dL 0.66 0.66 0.79  Sodium 135 - 145 mmol/L 138 134(L) 133(L)  Potassium 3.5 - 5.1 mmol/L 3.3(L) 3.6 3.9  Chloride 98 - 111 mmol/L 102 101 97(L)  CO2 22 - 32 mmol/L _1 Calcium 8.9 - 10.3 mg/dL 6.7(L) 8.4(L) 8.3(L)  Total Protein 6.5 - 8.1 g/dL 5.7(L) 6.5 6.8  Total Bilirubin 0.3 - 1.2 mg/dL 0.9 0.7 0.9  Alkaline Phos 38 - 126 U/L 277(H) 284(H) 210(H)  AST 15 - 41 U/L 150(H) 84(H) 34  ALT 0 - 44 U/L 36 34 23       DIAGNOSTIC IMAGING:  *The following radiologic images and reports have been reviewed independently and agree with below findings.      ASSESSMENT & PLAN:   No problem-specific Assessment & Plan notes found for this encounter.      Orders placed this encounter:  No orders of the defined types were placed in this encounter.     Derek Jack, MD [place] [phone]

## 2018-02-08 NOTE — Progress Notes (Signed)
Initial Nutrition Assessment  DOCUMENTATION CODES:      INTERVENTION:  Boost Breeze po TID, each supplement provides 250 kcal and 9 grams of protein    NUTRITION DIAGNOSIS:   Increased nutrient needs related to acute illness, chronic illness, cancer and cancer related treatments, mouth pain(acute inflammation (cholecystitis), metastatic cancer) as evidenced by estimated needs and associated clinical nutrition guidelines   GOAL:   Patient will meet greater than or equal to 90% of their needs  MONITOR:   Diet advancement, PO intake, Supplement acceptance, Weight trends, Labs  REASON FOR ASSESSMENT:   Malnutrition Screening Tool    ASSESSMENT: Patient is a 72 yo male with hx of prostate cancer with metastasis to bone and liver. He is undergoing chemo treatment. Patient presents with abdominal pain and has acute cholecystitis. Surgery consulted- pt not a candidate for surgery.  Patient complains of taste changes and mouth pain (bleeding gums and sores) over the past few months. His wife says it has been difficult to find foods he will eat. Denies problems with swallowing or chewing and is able to feed himself. Currently on liquid diet and consumed 100% of lunch today. He says feeling much better than at admission.   Review of weight history shows severe loss (14%) the past 6 months. Weighed 105 kg this past June and says he has weighed as much as (120 kg) 265 lb. Expect patient is malnourished (energy intake < 75% for >1 month and 14% wt loss). Unable to complete physical exam on initial visit.   Medications reviewed and include: levothyroxine, solu-cortef, gabapentin   Labs: BMP Latest Ref Rng & Units 02/08/2018 02/07/2018 01/18/2018  Glucose 70 - 99 mg/dL 124(H) 173(H) 210(H)  BUN 8 - 23 mg/dL 13 15 12   Creatinine 0.61 - 1.24 mg/dL 0.66 0.66 0.79  Sodium 135 - 145 mmol/L 138 134(L) 133(L)  Potassium 3.5 - 5.1 mmol/L 3.3(L) 3.6 3.9  Chloride 98 - 111 mmol/L 102 101 97(L)  CO2  22 - 32 mmol/L 26 22 25   Calcium 8.9 - 10.3 mg/dL 6.7(L) 8.4(L) 8.3(L)     NUTRITION - FOCUSED PHYSICAL EXAM: complete at follow up   Diet Order:   Diet Order            Diet clear liquid Room service appropriate? Yes; Fluid consistency: Thin  Diet effective now              EDUCATION NEEDS:   Not appropriate for education at this time Skin:  Skin Assessment: Reviewed RN Assessment  Last BM:  12/8   Height:   Ht Readings from Last 1 Encounters:  02/07/18 5\' 10"  (1.778 m)    Weight:   Wt Readings from Last 1 Encounters:  02/07/18 90.6 kg    Ideal Body Weight:  75 kg  BMI:  Body mass index is 28.66 kg/m.  Estimated Nutritional Needs:   Kcal:  9150-5697 (25-28 kcal/kg/bw)  Protein:  127-145 gr  (1.4-1.6 gr/kg/bw)  Fluid:  2.3-2.5 liters daily   Colman Cater MS,RD,CSG,LDN Office: [phone] Pager: [phone]

## 2018-02-09 LAB — COMPREHENSIVE METABOLIC PANEL
ALT: 29 U/L (ref 0–44)
AST: 100 U/L — ABNORMAL HIGH (ref 15–41)
Albumin: 2.6 g/dL — ABNORMAL LOW (ref 3.5–5.0)
Alkaline Phosphatase: 277 U/L — ABNORMAL HIGH (ref 38–126)
Anion gap: 10 (ref 5–15)
BUN: 12 mg/dL (ref 8–23)
CO2: 24 mmol/L (ref 22–32)
Calcium: 6.2 mg/dL — CL (ref 8.9–10.3)
Chloride: 103 mmol/L (ref 98–111)
Creatinine, Ser: 0.68 mg/dL (ref 0.61–1.24)
GFR calc Af Amer: >60 mL/min (ref >60)
GFR calc non Af Amer: >60 mL/min (ref >60)
Glucose, Bld: 147 mg/dL — ABNORMAL HIGH (ref 70–99)
POTASSIUM: 3.3 mmol/L — AB (ref 3.5–5.1)
SODIUM: 137 mmol/L (ref 135–145)
Total Bilirubin: 1 mg/dL (ref 0.3–1.2)
Total Protein: 5.7 g/dL — ABNORMAL LOW (ref 6.5–8.1)

## 2018-02-09 LAB — CBC
HCT: 28.8 % — ABNORMAL LOW (ref 39.0–52.0)
HEMOGLOBIN: 8.6 g/dL — AB (ref 13.0–17.0)
MCH: 28.6 pg (ref 26.0–34.0)
MCHC: 29.9 g/dL — ABNORMAL LOW (ref 30.0–36.0)
MCV: 95.7 fL (ref 80.0–100.0)
Platelets: 201 10*3/uL (ref 150–400)
RBC: 3.01 MIL/uL — ABNORMAL LOW (ref 4.22–5.81)
RDW: 20.3 % — ABNORMAL HIGH (ref 11.5–15.5)
WBC: 5.4 10*3/uL (ref 4.0–10.5)
nRBC: 0.7 % — ABNORMAL HIGH (ref 0.0–0.2)

## 2018-02-09 MED ORDER — PREDNISONE 5 MG PO TABS
7.5000 mg | ORAL_TABLET | Freq: Every day | ORAL | Status: DC
  Administered 2018-02-09 – 2018-02-10 (×2): 7.5 mg via ORAL
  Filled 2018-02-09 (×2): qty 2

## 2018-02-09 MED ORDER — POTASSIUM CHLORIDE CRYS ER 20 MEQ PO TBCR
40.0000 meq | EXTENDED_RELEASE_TABLET | Freq: Once | ORAL | Status: AC
  Administered 2018-02-09: 40 meq via ORAL
  Filled 2018-02-09: qty 2

## 2018-02-09 MED ORDER — CALCIUM GLUCONATE-NACL 1-0.675 GM/50ML-% IV SOLN
1.0000 g | Freq: Once | INTRAVENOUS | Status: AC
  Administered 2018-02-09: 1000 mg via INTRAVENOUS
  Filled 2018-02-09: qty 50

## 2018-02-09 MED ORDER — RIVAROXABAN 10 MG PO TABS
10.0000 mg | ORAL_TABLET | Freq: Every day | ORAL | Status: DC
  Administered 2018-02-09: 10 mg via ORAL
  Filled 2018-02-09 (×3): qty 1

## 2018-02-09 NOTE — Care Management Note (Signed)
Case Management Note  Patient Details  Name: Micheal Davis MRN: [id number] Date of Birth: [birth year]  Subjective/Objective:          Admitted with N/V. Pt from home with wife, ind with adl's. Has insurance and PCP. does not drive, wife provides transportation. Pt has RW he uses as needed. Has had 2 admission and 0 ED visits in 6 months. In on [facility] registry but not active. Undergoing chemo. He communicates no needs or concerns about DC.          Action/Plan: DC home with self care. No CM needs noted at this time.   Expected Discharge Date:       02/10/18           Expected Discharge Plan:  Home/Self Care  In-House Referral:  NA  Discharge planning Services  CM Consult  Post Acute Care Choice:  NA Choice offered to:  NA  Status of Service:  Completed, signed off  Sherald Barge, RN 02/09/2018, 3:16 PM

## 2018-02-09 NOTE — Progress Notes (Signed)
PROGRESS NOTE    Micheal Davis  JIR:[id number] DOB: 03-15-1945 DOA: 02/07/2018 PCP: Micheal Labrum, MD   Brief Narrative:  Per HPI from Dr. Alcario Drought: Micheal Davis a 72 y.o.malewith medical history significant ofprostate CA with widespread metastatic dz to bone, liver mets. PO chemo agents for 2.5 years then switched to IV chemo for past 3 years. Switched to new chemo agent, 3 treatments with that the most recent being Dec 2nd.  For the past couple of weeks he has been getting aching upper ABD pain. Yesterday had onset of severe vomiting, bilious vomit. Not helped by ODT zofran. Persistent and unable to keep even water or home PO meds down.  Patient has been admitted for intractable nausea and vomiting likely related to acute cholecystitis.   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 Intractable nausea and vomiting likely secondary to acute cholecystitis.  Continue empiric IV Zosyn as he appears to be clinically improving and could transition to oral Augmentin on discharge per general surgery recommendations for total course of antibiotic treatment of 14 days.  Advance diet to soft today.  Dilaudid as needed for pain and Zofran as needed for nausea.  Repeat CMP in a.m a.m. continue to trend enzymes.  Appreciate general surgery recommendations. 2. History of PE.    Resume home Xarelto and discontinue full dose Lovenox today. 3. Chronic prednisone use.    Resume home oral prednisone and DC IV Solu-Cortef. 4. Prostate cancer with liver mets.  Worsening liver mets noted on CT scan.  Right upper quadrant ultrasound with multiple gallstones but no biliary dilatation.   DVT prophylaxis:  Xarelto Code Status: DNR Family Communication: Wife at  bedside Disposition Plan: Continue to advance diet to soft today and maintain on IV Zosyn through today with anticipated discharge tomorrow if patient remains improved on oral Augmentin.  He will need a total course of 14 days of antibiotics per general surgery recommendations.   Consultants:   General surgery  Procedures:   None  Antimicrobials:   IV Zosyn 12/8->  Subjective: Patient seen and evaluated today with no new acute complaints or concerns. No acute concerns or events noted overnight.  He states his abdominal pain is improved and he is no longer experiencing any nausea or vomiting.  He is tolerating clear liquids.  Objective: Vitals:   02/08/18 0645 02/08/18 1448 02/08/18 2109 02/09/18 0512  BP: 127/66 (!) 141/73 124/76 119/70  Pulse: 70 72 71 64  Resp: 16 17    Temp: 97.9 F (36.6 C)  97.8 F (36.6 C) 98.7 F (37.1 C)  TempSrc: Oral  Oral Oral  SpO2: 95% 99% 100% 97%  Weight:      Height:        Intake/Output Summary (Last 24 hours) at 02/09/2018 1108 Last data filed at 02/09/2018 0400 Gross per 24 hour  Intake 699.75 ml  Output -  Net 699.75 ml   Filed Weights   02/07/18 0053 02/07/18 1151  Weight: 90.7 kg 90.6 kg    Examination:  General exam: Appears calm and comfortable  Respiratory system: Clear to auscultation. Respiratory effort normal. Cardiovascular system: S1 & S2 heard, RRR. No JVD, murmurs, rubs, gallops or clicks. No pedal edema. Gastrointestinal system: Abdomen is nondistended, soft and nontender.  No organomegaly or masses felt. Normal bowel sounds heard. Central nervous system: Alert and oriented. No focal neurological deficits. Extremities: Symmetric 5 x 5 power. Skin: No rashes, lesions or ulcers Psychiatry: Judgement and insight appear normal. Mood & affect appropriate.     Data Reviewed: I have personally reviewed following labs and imaging studies  CBC: Recent Labs  Lab 02/07/18 0151 02/08/18 0640 02/09/18 0539   WBC 9.3 5.8 5.4  NEUTROABS 7.9*  --   --   HGB 9.2* 8.6* 8.6*  HCT 30.3* 28.6* 28.8*  MCV 94.7 95.3 95.7  PLT 233 210 202   Basic Metabolic Panel: Recent Labs  Lab 02/07/18 0151 02/08/18 0640 02/09/18 0539  NA 134* 138 137  K 3.6 3.3* 3.3*  CL 101 102 103  CO2 22 26 24   GLUCOSE 173* 124* 147*  BUN 15 13 12   CREATININE 0.66 0.66 0.68  CALCIUM 8.4* 6.7* 6.2*   GFR: Estimated Creatinine Clearance: 93.1 mL/min (by C-G formula based on SCr of 0.68 mg/dL). Liver Function Tests: Recent Labs  Lab 02/07/18 0151 02/08/18 0640 02/09/18 0539  AST 84* 150* 100*  ALT 34 36 29  ALKPHOS 284* 277* 277*  BILITOT 0.7 0.9 1.0  PROT 6.5 5.7* 5.7*  ALBUMIN 3.1* 2.7* 2.6*   Recent Labs  Lab 02/07/18 0151  LIPASE 24   No results for input(s): AMMONIA in the last 168 hours. Coagulation Profile: No results for input(s): INR, PROTIME in the last 168 hours. Cardiac Enzymes: No results for input(s): CKTOTAL, CKMB, CKMBINDEX, TROPONINI in the last 168 hours. BNP (last 3 results) No results for input(s): PROBNP in the last 8760 hours. HbA1C: No results for input(s): HGBA1C in the last 72 hours. CBG: No results for input(s): GLUCAP in the last 168 hours. Lipid Profile: No results for input(s): CHOL, HDL, LDLCALC, TRIG, CHOLHDL, LDLDIRECT in the last 72 hours. Thyroid Function Tests: No results for input(s): TSH, T4TOTAL, FREET4, T3FREE, THYROIDAB in the last 72 hours. Anemia Panel: No results for input(s): VITAMINB12, FOLATE, FERRITIN, TIBC, IRON, RETICCTPCT in the last 72 hours. Sepsis Labs: No results for input(s): PROCALCITON, LATICACIDVEN in the last 168 hours.  No results found for this or any previous visit (from the past 240 hour(s)).       Radiology Studies: No results found.      Scheduled Meds: . enoxaparin (LOVENOX) injection  90 mg Subcutaneous Q12H  . feeding supplement  1 Container Oral TID BM  . gabapentin  300 mg Oral Daily  . gabapentin  600 mg Oral  QHS  . hydrocortisone sod succinate (SOLU-CORTEF) inj  50 mg Intravenous Q8H  . levothyroxine  25 mcg Oral QAC breakfast  . morphine  60 mg Oral Q12H  . tamsulosin  0.8 mg Oral q morning - 10a   Continuous Infusions: . sodium chloride Stopped (02/08/18 1855)  . piperacillin-tazobactam (ZOSYN)  IV 3.375 g (02/09/18 0606)     LOS: 2 days    Time spent: 80 minutes    Prisma Decarlo Darleen Crocker, DO [facility] Pager [phone]  If 7PM-7AM, please contact night-coverage [email or web address] Password [email or web address] 02/09/2018, 11:08 AM

## 2018-02-09 NOTE — Progress Notes (Signed)
[facility] Progress Note     Subjective: No complaints. Says tolerated clears. Was wondering why we did not pursue surgery, and I explained to him again the issues with his 2 weeks of abdominal pain and his liver metastasis. Explained that abnormal liver tissue/ cancer bleeds easily and areas not seen on CT can also be involved. Given the risk of injury and bleeding, antibiotics are the best option for him for the gallbladder.   Objective: Vital signs in last 24 hours: Temp:  [97.8 F (36.6 C)-98.7 F (37.1 C)] 98.7 F (37.1 C) (12/10 0512) Pulse Rate:  [64-72] 64 (12/10 0512) Resp:  [17] 17 (12/09 1448) BP: (119-141)/(70-76) 119/70 (12/10 0512) SpO2:  [97 %-100 %] 97 % (12/10 0512) Last BM Date: 02/07/18  Intake/Output from previous day: 12/09 0701 - 12/10 0700 In: 699.8 [P.O.:480; I.V.:69.8; IV Piggyback:150] Out: -  Intake/Output this shift: No intake/output data recorded.  General appearance: alert, appears stated age and no distress Resp: normal work breathing GI: soft, non-tender; bowel sounds normal; no masses,  no organomegaly  Lab Results:  Recent Labs    02/08/18 0640 02/09/18 0539  WBC 5.8 5.4  HGB 8.6* 8.6*  HCT 28.6* 28.8*  PLT 210 201   BMET Recent Labs    02/08/18 0640 02/09/18 0539  NA 138 137  K 3.3* 3.3*  CL 102 103  CO2 26 24  GLUCOSE 124* 147*  BUN 13 12  CREATININE 0.66 0.68  CALCIUM 6.7* 6.2*    Anti-infectives: Anti-infectives (From admission, onward)   Start     Dose/Rate Route Frequency Ordered Stop   02/07/18 1200  piperacillin-tazobactam (ZOSYN) IVPB 3.375 g     3.375 g 12.5 mL/hr over 240 Minutes Intravenous Every 8 hours 02/07/18 0737     02/07/18 0745  piperacillin-tazobactam (ZOSYN) IVPB 3.375 g  Status:  Discontinued     3.375 g 12.5 mL/hr over 240 Minutes Intravenous Every 8 hours 02/07/18 0737 02/07/18 0737   02/07/18 0400  piperacillin-tazobactam (ZOSYN) IVPB 3.375 g     3.375 g 100 mL/hr over 30  Minutes Intravenous  Once 02/07/18 0354 02/07/18 0428      Assessment/Plan: Micheal Davis is a 72 yo with acute cholecystitis in the setting of liver metastasis from his prostate cancer. Enlarging area in the liver and pain for 2+ weeks leading up to the admission. -IV antibiotics today -Soft diet today, if tolerates can go home tomorrow on Augmentin to complete a 14 day course -Can follow up in the office to see how he is doing and to discuss again reason behind not pursing an operation for his gallbladder    LOS: 2 days    Micheal Davis 02/09/2018

## 2018-02-09 NOTE — Progress Notes (Signed)
CRITICAL VALUE ALERT  Critical Value:  6.2 Calcium  Date & Time Notied:  0634  Provider Notified: S. Newton  Orders Received/Actions taken: awaiting new orders.   Calcium trending down, yesterday's reading 6.7. Will continue to monitor patient.

## 2018-02-10 LAB — COMPREHENSIVE METABOLIC PANEL
ALT: 27 U/L (ref 0–44)
AST: 61 U/L — AB (ref 15–41)
Albumin: 2.2 g/dL — ABNORMAL LOW (ref 3.5–5.0)
Alkaline Phosphatase: 250 U/L — ABNORMAL HIGH (ref 38–126)
Anion gap: 9 (ref 5–15)
BUN: 10 mg/dL (ref 8–23)
CO2: 23 mmol/L (ref 22–32)
Calcium: 6.5 mg/dL — ABNORMAL LOW (ref 8.9–10.3)
Chloride: 102 mmol/L (ref 98–111)
Creatinine, Ser: 0.58 mg/dL — ABNORMAL LOW (ref 0.61–1.24)
GFR calc Af Amer: >60 mL/min (ref >60)
GFR calc non Af Amer: >60 mL/min (ref >60)
Glucose, Bld: 176 mg/dL — ABNORMAL HIGH (ref 70–99)
Potassium: 3.7 mmol/L (ref 3.5–5.1)
Sodium: 134 mmol/L — ABNORMAL LOW (ref 135–145)
Total Bilirubin: 0.6 mg/dL (ref 0.3–1.2)
Total Protein: 5.2 g/dL — ABNORMAL LOW (ref 6.5–8.1)

## 2018-02-10 LAB — CBC
HCT: 25.9 % — ABNORMAL LOW (ref 39.0–52.0)
Hemoglobin: 7.8 g/dL — ABNORMAL LOW (ref 13.0–17.0)
MCH: 28.7 pg (ref 26.0–34.0)
MCHC: 30.1 g/dL (ref 30.0–36.0)
MCV: 95.2 fL (ref 80.0–100.0)
Platelets: 173 10*3/uL (ref 150–400)
RBC: 2.72 MIL/uL — ABNORMAL LOW (ref 4.22–5.81)
RDW: 19.7 % — ABNORMAL HIGH (ref 11.5–15.5)
WBC: 5.2 10*3/uL (ref 4.0–10.5)
nRBC: 0.6 % — ABNORMAL HIGH (ref 0.0–0.2)

## 2018-02-10 MED ORDER — HEPARIN SOD (PORK) LOCK FLUSH 100 UNIT/ML IV SOLN
500.0000 [IU] | INTRAVENOUS | Status: AC | PRN
  Administered 2018-02-10: 500 [IU]
  Filled 2018-02-10: qty 5

## 2018-02-10 MED ORDER — AMOXICILLIN-POT CLAVULANATE 875-125 MG PO TABS: 1.0000 | ORAL_TABLET | Freq: Two times a day (BID) | ORAL | 0 refills | Status: DC

## 2018-02-10 NOTE — Discharge Summary (Signed)
Physician Discharge Summary  [name] VWU:[id number] DOB: August 03, 1945 DOA: 02/07/2018  PCP: [name], MD  Admit date: 02/07/2018 Discharge date: 02/10/2018  Admitted From: Home Disposition: Home   Recommendations for Outpatient Follow-up:  1. Follow up with PCP in 1-2 weeks 2. Please obtain CMP and CBC at follow up 3. Follow up with general surgery, Dr. Constance Haw.   Home Health: None Equipment/Devices: None Discharge Condition: Stable CODE STATUS: DNR Diet recommendation: Low fat  Brief/Interim Summary: Per HPI from Dr. Alcario Drought: Micheal Davis a 72 y.o.malewith medical history significant ofprostate CA with widespread metastatic dz to bone, liver mets. PO chemo agents for 2.5 years then switched to IV chemo for past 3 years. Switched to new chemo agent, 3 treatments with that the most recent being Dec 2nd.  For the past couple of weeks he has been getting aching upper ABD pain. Yesterday had onset of severe vomiting, bilious vomit. Not helped by ODT zofran. Persistent and unable to keep even water or home PO meds down.  Patient was admitted for intractable nausea and vomiting likely related to acute cholecystitis.General surgery was consulted and advised against surgery. IV zosyn was provided with steady improvement and eventual toleration of low fat diet as recommended by surgery.   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 Tolerating diet.  - Continue augmentin (transitioned from zosyn while inpatient) per general surgery recommendations for total course of antibiotic treatment of 14 days (12/7 - 12/20).   History of PE.    Resume home Xarelto  Chronic  prednisone use.    Resume home oral prednisone and DC IV Solu-Cortef.  Prostate cancer with liver mets. Worsening liver mets noted on CT scan Needs onc follow up. Right upper quadrant ultrasound with multiple gallstones but no biliary dilatation.  Discharge Instructions Discharge Instructions    Discharge instructions   Complete by:  As directed    Continue taking antibiotics, augmentin, twice daily until you run out of pills. This will complete a total of 14 days. If you develop vomiting or are unable to take this medications, seek medical advice right away. Otherwise, follow up with your primary doctor in the next 1-2 weeks and with general surgery, Dr. Constance Haw, for hospital follow up. Follow a low fat/low grease diet.   Increase activity slowly   Complete by:  As directed      Allergies as of 02/10/2018   No Known Allergies     Medication List    TAKE these medications   amoxicillin-clavulanate 875-125 MG tablet Commonly known as:  AUGMENTIN Take 1 tablet by mouth 2 (two) times daily.   aspirin EC 81 MG tablet Take 81 mg by mouth daily.   atenolol 100 MG tablet Commonly known as:  TENORMIN Take 100 mg by mouth daily.   atorvastatin 20 MG tablet Commonly known as:  LIPITOR Take 10 mg by mouth every morning.   CALCIUM PLUS VITAMIN D3 600-500 MG-UNIT Caps Generic drug:  Calcium Carb-Cholecalciferol Take 1,200 mg by mouth daily.   chlorhexidine 0.12 % solution Commonly known as:  PERIDEX Use as directed 15 mLs in the mouth or throat 2 (two) times daily.   cyclobenzaprine 10 MG tablet Commonly known as:  FLEXERIL  Take 10 mg by mouth 3 (three) times daily as needed for muscle spasms.   diltiazem 300 MG 24 hr capsule Commonly known as:  CARDIZEM CD Take 300 mg by mouth daily.   FIRST-DUKES MOUTHWASH Susp Use as directed 15 mLs in the mouth or throat 4 (four) times daily as needed.   FISH OIL PO Take 1 capsule by mouth daily.   gabapentin 300 MG  capsule Commonly known as:  NEURONTIN Patient may take 1 capsule in the morning and 2 capsules @ bedtime. What changed:    how much to take  how to take this  when to take this   GLUCOSAMINE 1500 COMPLEX PO Take 1 tablet by mouth 2 (two) times daily.   leuprolide 7.5 MG injection Commonly known as:  LUPRON Inject 7.5 mg into the muscle every 28 (twenty-eight) days.   levothyroxine 25 MCG tablet Commonly known as:  SYNTHROID, LEVOTHROID Take 25 mcg by mouth daily before breakfast.   loperamide 1 MG/5ML solution Commonly known as:  IMODIUM Take 1 mg by mouth as needed for diarrhea or loose stools.   metFORMIN 500 MG tablet Commonly known as:  GLUCOPHAGE Take 500 mg by mouth 2 (two) times daily with a meal.   morphine 30 MG tablet Commonly known as:  MSIR Take 1 tablet (30 mg total) by mouth every 6 (six) hours as needed for severe pain.   morphine 60 MG 12 hr tablet Commonly known as:  MS CONTIN Take 1 tablet (60 mg total) by mouth every 12 (twelve) hours.   multivitamin with minerals Tabs tablet Take 1 tablet by mouth daily.   ondansetron 8 MG disintegrating tablet Commonly known as:  ZOFRAN-ODT Take 1 tablet (8 mg total) by mouth every 8 (eight) hours as needed for nausea or vomiting.   potassium chloride SA 20 MEQ tablet Commonly known as:  K-DUR,KLOR-CON TAKE 20 MEQ BY MOUTH ONCE DAILY What changed:    how much to take  how to take this  when to take this   predniSONE 5 MG tablet Commonly known as:  DELTASONE Take 1 tablet (5 mg total) by mouth 2 (two) times daily. What changed:    how much to take  when to take this   rivaroxaban 10 MG Tabs tablet Commonly known as:  XARELTO Take 1 tablet (10 mg total) by mouth daily.   tamsulosin 0.4 MG Caps capsule Commonly known as:  FLOMAX Take 0.8 mg by mouth every morning.   traMADol 50 MG tablet Commonly known as:  ULTRAM Take 50 mg by mouth daily.   triamterene-hydrochlorothiazide 37.5-25 MG  tablet Commonly known as:  MAXZIDE-25 Take 1 tablet by mouth daily. for high blood pressure   vitamin E 400 UNIT capsule Take 800 Units by mouth daily.       No Known Allergies  Consultations:  General surgery  Procedures/Studies: Mr Jeri Cos Wo Contrast  Result Date: 02/05/2018 CLINICAL DATA:  Metastatic prostate cancer. Right-sided headache over the last 3 weeks. Tingling and numbness of the left arm. EXAM: MRI HEAD WITHOUT AND WITH CONTRAST TECHNIQUE: Multiplanar, multiecho pulse sequences of the brain and surrounding structures were obtained without and with intravenous contrast. CONTRAST:  10 cc Gadavist COMPARISON:  Head CT 10/06/2017 and 10/20/2012. FINDINGS: Brain: Diffusion imaging does not show any acute or subacute infarction. There are mild chronic small-vessel ischemic changes of the cerebral hemispheric white matter. No evidence of metastatic disease. No hydrocephalus. There is been resorption of the previously seen right  subdural hematoma. There is now only right-sided dural thickening measuring maximal about 3 mm. Mild dural thickening and enhancement seen elsewhere, reactive. No recurrent subdural hematoma. No mass effect or midline shift. Vascular: Major vessels at the base of the brain show flow. Skull and upper cervical spine: Widespread metastatic disease affecting the calvarium and the skull base as seen previously. No evidence of extraosseous tumor. Sinuses/Orbits: Acute right maxillary sinusitis which could be symptomatic. Other sinuses are clear. Orbits negative. Other: None IMPRESSION: Acute right-sided maxillary sinusitis which could be a cause of pain. No acute brain finding. Mild chronic small-vessel change of the white matter. No evidence of metastatic disease to the brain. Resorption of the previously seen right subdural hematoma. Mild dural thickening on the right, maximal thickness 3 mm. No mass effect. Osseous metastatic disease affecting the calvarium and the  skull base. Electronically Signed   By: Nelson Chimes M.D.   On: 02/05/2018 14:06   Ct Abdomen Pelvis W Contrast  Result Date: 02/07/2018 CLINICAL DATA:  72 year old male with epigastric abdominal pain and vomiting. History of prostate cancer metastatic to bone. EXAM: CT ABDOMEN AND PELVIS WITH CONTRAST TECHNIQUE: Multidetector CT imaging of the abdomen and pelvis was performed using the standard protocol following bolus administration of intravenous contrast. CONTRAST:  112mL ISOVUE-300 IOPAMIDOL (ISOVUE-300) INJECTION 61% COMPARISON:  CT of the abdomen pelvis dated 10/06/2017 FINDINGS: Lower chest: Minimal bibasilar atelectatic changes. There is mild cardiomegaly. No intra-abdominal free air or free fluid. Hepatobiliary: Multiple hepatic metastatic disease with the largest measuring approximately 9.1 x 9.7 cm (previously 5.1 x 5.5 cm) in segment IV. Significant interval increase in the number and size of the metastatic disease compared to the prior CT. There is diffuse thickening of the gallbladder wall which may be related to underlying systemic process or liver dysfunction. Further evaluation with ultrasound recommended to exclude acute cholecystitis. There is a noncalcified stone within the gallbladder. Pancreas: Unremarkable. No pancreatic ductal dilatation or surrounding inflammatory changes. Spleen: Normal in size without focal abnormality. Adrenals/Urinary Tract: The adrenal glands are unremarkable. There is no hydronephrosis on either side. There is a 15 mm right renal upper pole cyst. Smaller left renal hypodense lesion is not characterized. The visualized ureters appear unremarkable. The urinary bladder is trabeculated. Stomach/Bowel: Scattered sigmoid diverticula without active inflammatory changes. Normal caliber fecalized loops of small bowel may represent increased transit time or small intestine bacterial overgrowth. There is no bowel obstruction or active inflammation. Normal appendix.  Vascular/Lymphatic: Moderate aortoiliac atherosclerotic disease. No portal venous gas. There is no adenopathy. Reproductive: The prostate and seminal vesicles are grossly unremarkable. Other: None Musculoskeletal: Extensive osseous metastatic disease. Bilateral L5 pars defects. No acute osseous pathology. IMPRESSION: 1. Significant interval increase in the number and size of the hepatic metastatic disease compared to the prior CT. 2. Diffuse thickening of the gallbladder wall may be related to underlying systemic process or liver dysfunction. Further evaluation with ultrasound recommended. 3. Colonic diverticulosis. No bowel obstruction or active inflammation. Normal appendix. 4. Extensive osseous metastatic disease. Electronically Signed   By: Anner Crete M.D.   On: 02/07/2018 03:42   US Abdomen Limited Ruq  Result Date: 02/07/2018 CLINICAL DATA:  Epigastric pain and vomiting for 2 weeks. Cholelithiasis. Metastatic prostate carcinoma. EXAM: ULTRASOUND ABDOMEN LIMITED RIGHT UPPER QUADRANT COMPARISON:  CT on 02/07/2018 FINDINGS: Gallbladder: Multiple gallstones are seen measuring up to 2 cm. Diffuse gallbladder wall thickening is seen measuring up to 12 mm. Positive sonographic Murphy sign noted by sonographer. These findings are  consistent with acute cholecystitis. Common bile duct: Diameter: 4 mm, within normal limits. Liver: Multiple solid hepatic masses are again seen both the right and left lobes, largest measuring approximately 10 cm, consistent with diffuse liver metastases. Portal vein is patent on color Doppler imaging with normal direction of blood flow towards the liver. IMPRESSION: Findings consistent with acute cholecystitis. No evidence of biliary ductal dilatation. Diffuse liver metastases. Electronically Signed   By: Earle Gell M.D.   On: 02/07/2018 09:30    Subjective: No abdominal pain. No nausea or vomiting. No fever. Feels well, getting around well and eating soft/low fat diet  without issues.   Discharge Exam: Vitals:   02/09/18 2107 02/10/18 0513  BP: 123/76 140/74  Pulse: 79 93  Resp:    Temp: 98.6 F (37 C) 98 F (36.7 C)  SpO2: 96% 97%   General: Pt is alert, awake, not in acute distress Cardiovascular: RRR, S1/S2 +, no rubs, no gallops Respiratory: CTA bilaterally, no wheezing, no rhonchi Abdominal: Soft, minimal RUQ tenderness without palpable mass, rebound or guarding, ND, bowel sounds + Extremities: No edema, no cyanosis  Labs: BNP (last 3 results) Recent Labs    10/06/17 1705  BNP 542.7*   Basic Metabolic Panel: Recent Labs  Lab 02/07/18 0151 02/08/18 0640 02/09/18 0539 02/10/18 0000  NA 134* 138 137 134*  K 3.6 3.3* 3.3* 3.7  CL 101 102 103 102  CO2 22 26 24 23   GLUCOSE 173* 124* 147* 176*  BUN 15 13 12 10   CREATININE 0.66 0.66 0.68 0.58*  CALCIUM 8.4* 6.7* 6.2* 6.5*   Liver Function Tests: Recent Labs  Lab 02/07/18 0151 02/08/18 0640 02/09/18 0539 02/10/18 0000  AST 84* 150* 100* 61*  ALT 34 36 29 27  ALKPHOS 284* 277* 277* 250*  BILITOT 0.7 0.9 1.0 0.6  PROT 6.5 5.7* 5.7* 5.2*  ALBUMIN 3.1* 2.7* 2.6* 2.2*   Recent Labs  Lab 02/07/18 0151  LIPASE 24   No results for input(s): AMMONIA in the last 168 hours. CBC: Recent Labs  Lab 02/07/18 0151 02/08/18 0640 02/09/18 0000 02/09/18 0539  WBC 9.3 5.8 5.2 5.4  NEUTROABS 7.9*  --   --   --   HGB 9.2* 8.6* 7.8* 8.6*  HCT 30.3* 28.6* 25.9* 28.8*  MCV 94.7 95.3 95.2 95.7  PLT 233 210 173 201   Cardiac Enzymes: No results for input(s): CKTOTAL, CKMB, CKMBINDEX, TROPONINI in the last 168 hours. BNP: Invalid input(s): POCBNP CBG: No results for input(s): GLUCAP in the last 168 hours. D-Dimer No results for input(s): DDIMER in the last 72 hours. Hgb A1c No results for input(s): HGBA1C in the last 72 hours. Lipid Profile No results for input(s): CHOL, HDL, LDLCALC, TRIG, CHOLHDL, LDLDIRECT in the last 72 hours. Thyroid function studies No results for  input(s): TSH, T4TOTAL, T3FREE, THYROIDAB in the last 72 hours.  Invalid input(s): FREET3 Anemia work up No results for input(s): VITAMINB12, FOLATE, FERRITIN, TIBC, IRON, RETICCTPCT in the last 72 hours. Urinalysis    Component Value Date/Time   COLORURINE YELLOW 02/07/2018 0308   APPEARANCEUR CLEAR 02/07/2018 0308   LABSPEC 1.018 02/07/2018 0308   PHURINE 7.0 02/07/2018 0308   GLUCOSEU NEGATIVE 02/07/2018 0308   HGBUR NEGATIVE 02/07/2018 0308   BILIRUBINUR NEGATIVE 02/07/2018 0308   KETONESUR NEGATIVE 02/07/2018 0308   PROTEINUR NEGATIVE 02/07/2018 0308   UROBILINOGEN 1.0 09/27/2010 1055   NITRITE NEGATIVE 02/07/2018 0308   LEUKOCYTESUR NEGATIVE 02/07/2018 0308    Microbiology No results  found for this or any previous visit (from the past 240 hour(s)).  Time coordinating discharge: Approximately 40 minutes  Patrecia Pour, MD  [facility] 02/10/2018, 10:17 AM Pager [phone]

## 2018-02-10 NOTE — Progress Notes (Signed)
Patient is to be discharged home and in stable condition. Patient's port deaccessed, WNL. Patient given discharge instructions and verbalized understanding. Patient and wife verbalized understanding of the need for follow-up appointments with Dr.Bridges. Patient transported out by staff via wheelchair.  Celestia Khat, RN

## 2018-02-10 NOTE — Progress Notes (Signed)
[facility] Progress Note     Subjective: Tolerated soft diet. Feeling better. No complaints   Objective: Vital signs in last 24 hours: Temp:  [98 F (36.7 C)-98.6 F (37 C)] 98 F (36.7 C) (12/11 0513) Pulse Rate:  [76-93] 93 (12/11 0513) Resp:  [17] 17 (12/10 1314) BP: (123-140)/(67-76) 140/74 (12/11 0513) SpO2:  [94 %-97 %] 97 % (12/11 0513) Last BM Date: 02/07/18  Intake/Output from previous day: 12/10 0701 - 12/11 0700 In: 392 [P.O.:240; IV Piggyback:152] Out: 400 [Urine:400] Intake/Output this shift: No intake/output data recorded.  General appearance: alert, cooperative and no distress Resp: normal work of breathing GI: soft, nondistended, minimally tender  Lab Results:  Recent Labs    02/09/18 0000 02/09/18 0539  WBC 5.2 5.4  HGB 7.8* 8.6*  HCT 25.9* 28.8*  PLT 173 201   BMET Recent Labs    02/09/18 0539 02/10/18 0000  NA 137 134*  K 3.3* 3.7  CL 103 102  CO2 24 23  GLUCOSE 147* 176*  BUN 12 10  CREATININE 0.68 0.58*  CALCIUM 6.2* 6.5*    Anti-infectives: Anti-infectives (From admission, onward)   Start     Dose/Rate Route Frequency Ordered Stop   02/07/18 1200  piperacillin-tazobactam (ZOSYN) IVPB 3.375 g     3.375 g 12.5 mL/hr over 240 Minutes Intravenous Every 8 hours 02/07/18 0737     02/07/18 0745  piperacillin-tazobactam (ZOSYN) IVPB 3.375 g  Status:  Discontinued     3.375 g 12.5 mL/hr over 240 Minutes Intravenous Every 8 hours 02/07/18 0737 02/07/18 0737   02/07/18 0400  piperacillin-tazobactam (ZOSYN) IVPB 3.375 g     3.375 g 100 mL/hr over 30 Minutes Intravenous  Once 02/07/18 0354 02/07/18 0428      Assessment/Plan: Micheal Davis is a 72 yo with acute cholecystitis in the setting of liver metastasis.  Doing well on antibiotics. Pain resolved. Continue to adv diet Augmentin to complete a 14 day course Will see in clinic in 1-2 weeks     LOS: 3 days    Virl Cagey 02/10/2018

## 2018-02-10 NOTE — Progress Notes (Signed)
Pharmacy Antibiotic Note  Micheal Davis is a 72 y.o. male admitted on 02/07/2018 with intra abdominal infection.  Pharmacy has been consulted for zosyn dosing. Pt improving, plan 24 more hour of zosyn and transition to po Augmentin. Advancing diet.  Plan: Continue Zosyn 3.375g IV q8h (4 hour infusion).  F/u labs and clinical progress  Height: 5\' 10"  (177.8 cm) Weight: 199 lb 11.8 oz (90.6 kg) IBW/kg (Calculated) : 73  Temp (24hrs), Avg:98.3 F (36.8 C), Min:98 F (36.7 C), Max:98.6 F (37 C)  Recent Labs  Lab 02/07/18 0151 02/08/18 0640 02/09/18 0000 02/09/18 0539 02/10/18 0000  WBC 9.3 5.8 5.2 5.4  --   CREATININE 0.66 0.66  --  0.68 0.58*    Estimated Creatinine Clearance: 93.1 mL/min (A) (by C-G formula based on SCr of 0.58 mg/dL (L)).    No Known Allergies Anti-infectives (From admission, onward)   Start     Dose/Rate Route Frequency Ordered Stop   02/07/18 1200  piperacillin-tazobactam (ZOSYN) IVPB 3.375 g     3.375 g 12.5 mL/hr over 240 Minutes Intravenous Every 8 hours 02/07/18 0737     02/07/18 0745  piperacillin-tazobactam (ZOSYN) IVPB 3.375 g  Status:  Discontinued     3.375 g 12.5 mL/hr over 240 Minutes Intravenous Every 8 hours 02/07/18 0737 02/07/18 0737   02/07/18 0400  piperacillin-tazobactam (ZOSYN) IVPB 3.375 g     3.375 g 100 mL/hr over 30 Minutes Intravenous  Once 02/07/18 0354 02/07/18 0428      Antimicrobials this admission: 12/8 zosyn >>    Microbiology results: No cultures at this time   Thank you for allowing pharmacy to be a part of this patient's care.  Isac Sarna, BS Pharm D, BCPS Clinical Pharmacist Pager [phone] 02/10/2018 9:20 AM

## 2018-02-11 ENCOUNTER — Other Ambulatory Visit: Payer: Self-pay

## 2018-02-11 ENCOUNTER — Encounter (HOSPITAL_COMMUNITY): Payer: Self-pay | Admitting: Emergency Medicine

## 2018-02-11 ENCOUNTER — Inpatient Hospital Stay (HOSPITAL_COMMUNITY)
Admission: EM | Admit: 2018-02-11 | Discharge: 2018-02-16 | DRG: 445 | Disposition: A | Discharge status: Home Health | Payer: Medicare Other | Attending: Internal Medicine | Admitting: Internal Medicine

## 2018-02-11 DIAGNOSIS — K819 Cholecystitis, unspecified: Secondary | ICD-10-CM

## 2018-02-11 DIAGNOSIS — I824Y9 Acute embolism and thrombosis of unspecified deep veins of unspecified proximal lower extremity: Secondary | ICD-10-CM | POA: Diagnosis not present

## 2018-02-11 DIAGNOSIS — Z7189 Other specified counseling: Secondary | ICD-10-CM | POA: Diagnosis not present

## 2018-02-11 DIAGNOSIS — K219 Gastro-esophageal reflux disease without esophagitis: Secondary | ICD-10-CM | POA: Diagnosis present

## 2018-02-11 DIAGNOSIS — Z79890 Hormone replacement therapy (postmenopausal): Secondary | ICD-10-CM | POA: Diagnosis not present

## 2018-02-11 DIAGNOSIS — K810 Acute cholecystitis: Principal | ICD-10-CM | POA: Diagnosis present

## 2018-02-11 DIAGNOSIS — I824Z9 Acute embolism and thrombosis of unspecified deep veins of unspecified distal lower extremity: Secondary | ICD-10-CM | POA: Diagnosis not present

## 2018-02-11 DIAGNOSIS — E119 Type 2 diabetes mellitus without complications: Secondary | ICD-10-CM

## 2018-02-11 DIAGNOSIS — Z66 Do not resuscitate: Secondary | ICD-10-CM | POA: Diagnosis present

## 2018-02-11 DIAGNOSIS — E039 Hypothyroidism, unspecified: Secondary | ICD-10-CM | POA: Diagnosis present

## 2018-02-11 DIAGNOSIS — D649 Anemia, unspecified: Secondary | ICD-10-CM

## 2018-02-11 DIAGNOSIS — C7951 Secondary malignant neoplasm of bone: Secondary | ICD-10-CM | POA: Diagnosis not present

## 2018-02-11 DIAGNOSIS — Z86711 Personal history of pulmonary embolism: Secondary | ICD-10-CM

## 2018-02-11 DIAGNOSIS — I4891 Unspecified atrial fibrillation: Secondary | ICD-10-CM | POA: Diagnosis present

## 2018-02-11 DIAGNOSIS — E876 Hypokalemia: Secondary | ICD-10-CM

## 2018-02-11 DIAGNOSIS — Z79899 Other long term (current) drug therapy: Secondary | ICD-10-CM | POA: Diagnosis not present

## 2018-02-11 DIAGNOSIS — G4730 Sleep apnea, unspecified: Secondary | ICD-10-CM | POA: Diagnosis present

## 2018-02-11 DIAGNOSIS — C61 Malignant neoplasm of prostate: Secondary | ICD-10-CM

## 2018-02-11 DIAGNOSIS — G893 Neoplasm related pain (acute) (chronic): Secondary | ICD-10-CM | POA: Diagnosis present

## 2018-02-11 DIAGNOSIS — T451X5A Adverse effect of antineoplastic and immunosuppressive drugs, initial encounter: Secondary | ICD-10-CM | POA: Diagnosis not present

## 2018-02-11 DIAGNOSIS — G620 Drug-induced polyneuropathy: Secondary | ICD-10-CM

## 2018-02-11 DIAGNOSIS — Z79891 Long term (current) use of opiate analgesic: Secondary | ICD-10-CM | POA: Diagnosis not present

## 2018-02-11 DIAGNOSIS — Z86718 Personal history of other venous thrombosis and embolism: Secondary | ICD-10-CM

## 2018-02-11 DIAGNOSIS — Z8611 Personal history of tuberculosis: Secondary | ICD-10-CM | POA: Diagnosis not present

## 2018-02-11 DIAGNOSIS — Z515 Encounter for palliative care: Secondary | ICD-10-CM

## 2018-02-11 DIAGNOSIS — R112 Nausea with vomiting, unspecified: Secondary | ICD-10-CM

## 2018-02-11 DIAGNOSIS — G894 Chronic pain syndrome: Secondary | ICD-10-CM | POA: Diagnosis present

## 2018-02-11 DIAGNOSIS — E1142 Type 2 diabetes mellitus with diabetic polyneuropathy: Secondary | ICD-10-CM | POA: Diagnosis present

## 2018-02-11 DIAGNOSIS — Z9221 Personal history of antineoplastic chemotherapy: Secondary | ICD-10-CM | POA: Diagnosis not present

## 2018-02-11 DIAGNOSIS — Z7901 Long term (current) use of anticoagulants: Secondary | ICD-10-CM | POA: Diagnosis not present

## 2018-02-11 DIAGNOSIS — E079 Disorder of thyroid, unspecified: Secondary | ICD-10-CM | POA: Diagnosis present

## 2018-02-11 DIAGNOSIS — C801 Malignant (primary) neoplasm, unspecified: Secondary | ICD-10-CM | POA: Diagnosis not present

## 2018-02-11 DIAGNOSIS — Z7952 Long term (current) use of systemic steroids: Secondary | ICD-10-CM | POA: Diagnosis not present

## 2018-02-11 DIAGNOSIS — C787 Secondary malignant neoplasm of liver and intrahepatic bile duct: Secondary | ICD-10-CM | POA: Diagnosis not present

## 2018-02-11 DIAGNOSIS — R109 Unspecified abdominal pain: Secondary | ICD-10-CM | POA: Diagnosis not present

## 2018-02-11 DIAGNOSIS — I10 Essential (primary) hypertension: Secondary | ICD-10-CM | POA: Diagnosis present

## 2018-02-11 DIAGNOSIS — R5381 Other malaise: Secondary | ICD-10-CM

## 2018-02-11 DIAGNOSIS — I82409 Acute embolism and thrombosis of unspecified deep veins of unspecified lower extremity: Secondary | ICD-10-CM

## 2018-02-11 LAB — CBC WITH DIFFERENTIAL/PLATELET
Abs Immature Granulocytes: 0.07 10*3/uL (ref 0.00–0.07)
Basophils Absolute: 0 10*3/uL (ref 0.0–0.1)
Basophils Relative: 0 %
Eosinophils Absolute: 0 10*3/uL (ref 0.0–0.5)
Eosinophils Relative: 0 %
HCT: 30.9 % — ABNORMAL LOW (ref 39.0–52.0)
Hemoglobin: 9.3 g/dL — ABNORMAL LOW (ref 13.0–17.0)
IMMATURE GRANULOCYTES: 1 %
Lymphocytes Relative: 3 %
Lymphs Abs: 0.3 10*3/uL — ABNORMAL LOW (ref 0.7–4.0)
MCH: 27.8 pg (ref 26.0–34.0)
MCHC: 30.1 g/dL (ref 30.0–36.0)
MCV: 92.2 fL (ref 80.0–100.0)
Monocytes Absolute: 0.6 10*3/uL (ref 0.1–1.0)
Monocytes Relative: 7 %
Neutro Abs: 7.1 10*3/uL (ref 1.7–7.7)
Neutrophils Relative %: 89 %
Platelets: 203 10*3/uL (ref 150–400)
RBC: 3.35 MIL/uL — ABNORMAL LOW (ref 4.22–5.81)
RDW: 19.7 % — ABNORMAL HIGH (ref 11.5–15.5)
WBC: 8 10*3/uL (ref 4.0–10.5)
nRBC: 0.3 % — ABNORMAL HIGH (ref 0.0–0.2)

## 2018-02-11 LAB — COMPREHENSIVE METABOLIC PANEL
ALT: 31 U/L (ref 0–44)
AST: 46 U/L — ABNORMAL HIGH (ref 15–41)
Albumin: 2.5 g/dL — ABNORMAL LOW (ref 3.5–5.0)
Alkaline Phosphatase: 259 U/L — ABNORMAL HIGH (ref 38–126)
Anion gap: 10 (ref 5–15)
BUN: 11 mg/dL (ref 8–23)
CO2: 22 mmol/L (ref 22–32)
Calcium: 6.9 mg/dL — ABNORMAL LOW (ref 8.9–10.3)
Chloride: 103 mmol/L (ref 98–111)
Creatinine, Ser: 0.56 mg/dL — ABNORMAL LOW (ref 0.61–1.24)
GFR calc Af Amer: >60 mL/min (ref >60)
GFR calc non Af Amer: >60 mL/min (ref >60)
Glucose, Bld: 218 mg/dL — ABNORMAL HIGH (ref 70–99)
Potassium: 3.3 mmol/L — ABNORMAL LOW (ref 3.5–5.1)
Sodium: 135 mmol/L (ref 135–145)
Total Bilirubin: 1.3 mg/dL — ABNORMAL HIGH (ref 0.3–1.2)
Total Protein: 6.1 g/dL — ABNORMAL LOW (ref 6.5–8.1)

## 2018-02-11 LAB — LIPASE, BLOOD: Lipase: 21 U/L (ref 11–51)

## 2018-02-11 LAB — GLUCOSE, CAPILLARY: Glucose-Capillary: 166 mg/dL — ABNORMAL HIGH (ref 70–99)

## 2018-02-11 LAB — MAGNESIUM: Magnesium: 2 mg/dL (ref 1.7–2.4)

## 2018-02-11 MED ORDER — POTASSIUM CHLORIDE 10 MEQ/100ML IV SOLN
10.0000 meq | INTRAVENOUS | Status: AC
  Administered 2018-02-11 (×2): 10 meq via INTRAVENOUS
  Filled 2018-02-11 (×2): qty 100

## 2018-02-11 MED ORDER — SODIUM CHLORIDE 0.9 % IV BOLUS
1000.0000 mL | Freq: Once | INTRAVENOUS | Status: AC
  Administered 2018-02-11: 1000 mL via INTRAVENOUS

## 2018-02-11 MED ORDER — SODIUM CHLORIDE 0.9 % IV SOLN
8.0000 mg | Freq: Once | INTRAVENOUS | Status: AC
  Administered 2018-02-11: 8 mg via INTRAVENOUS
  Filled 2018-02-11: qty 4

## 2018-02-11 MED ORDER — PROMETHAZINE HCL 25 MG/ML IJ SOLN
12.5000 mg | INTRAMUSCULAR | Status: DC | PRN
  Administered 2018-02-11: 12.5 mg via INTRAVENOUS
  Filled 2018-02-11: qty 1

## 2018-02-11 MED ORDER — HYDROCORTISONE NA SUCCINATE PF 100 MG IJ SOLR
50.0000 mg | Freq: Three times a day (TID) | INTRAMUSCULAR | Status: DC
  Administered 2018-02-12 – 2018-02-15 (×10): 50 mg via INTRAVENOUS
  Filled 2018-02-11 (×10): qty 2

## 2018-02-11 MED ORDER — HYDROMORPHONE HCL 1 MG/ML IJ SOLN
0.5000 mg | INTRAMUSCULAR | Status: DC | PRN
  Administered 2018-02-11 – 2018-02-12 (×5): 0.5 mg via INTRAVENOUS
  Filled 2018-02-11: qty 1
  Filled 2018-02-11 (×4): qty 0.5

## 2018-02-11 MED ORDER — RIVAROXABAN 10 MG PO TABS
10.0000 mg | ORAL_TABLET | Freq: Every day | ORAL | Status: DC
  Filled 2018-02-11 (×4): qty 1

## 2018-02-11 MED ORDER — PROMETHAZINE HCL 25 MG/ML IJ SOLN
12.5000 mg | Freq: Four times a day (QID) | INTRAMUSCULAR | Status: DC | PRN
  Administered 2018-02-11 – 2018-02-12 (×2): 12.5 mg via INTRAVENOUS
  Filled 2018-02-11 (×2): qty 1

## 2018-02-11 MED ORDER — FENTANYL CITRATE (PF) 100 MCG/2ML IJ SOLN
50.0000 ug | Freq: Once | INTRAMUSCULAR | Status: AC
  Administered 2018-02-11: 50 ug via INTRAVENOUS
  Filled 2018-02-11: qty 2

## 2018-02-11 MED ORDER — POTASSIUM CHLORIDE IN NACL 20-0.9 MEQ/L-% IV SOLN
INTRAVENOUS | Status: AC
  Administered 2018-02-12: 02:00:00 via INTRAVENOUS

## 2018-02-11 MED ORDER — CHLORHEXIDINE GLUCONATE 0.12 % MT SOLN
15.0000 mL | Freq: Two times a day (BID) | OROMUCOSAL | Status: DC
  Administered 2018-02-12 – 2018-02-16 (×4): 15 mL via OROMUCOSAL
  Filled 2018-02-11 (×7): qty 15

## 2018-02-11 MED ORDER — ONDANSETRON HCL 4 MG/2ML IJ SOLN
4.0000 mg | Freq: Four times a day (QID) | INTRAMUSCULAR | Status: DC | PRN
  Administered 2018-02-11 – 2018-02-16 (×18): 4 mg via INTRAVENOUS
  Filled 2018-02-11 (×18): qty 2

## 2018-02-11 MED ORDER — LEVOTHYROXINE SODIUM 100 MCG IV SOLR
12.5000 ug | Freq: Every day | INTRAVENOUS | Status: DC
  Administered 2018-02-13 – 2018-02-15 (×2): 12.5 ug via INTRAVENOUS
  Filled 2018-02-11 (×2): qty 5

## 2018-02-11 MED ORDER — PIPERACILLIN-TAZOBACTAM 3.375 G IVPB
3.3750 g | Freq: Three times a day (TID) | INTRAVENOUS | Status: DC
  Administered 2018-02-11 – 2018-02-15 (×11): 3.375 g via INTRAVENOUS
  Filled 2018-02-11 (×10): qty 50

## 2018-02-11 NOTE — ED Notes (Signed)
Called for transport of Pt back to [facility] as requested by his nurse.

## 2018-02-11 NOTE — ED Notes (Signed)
Patient sleeping.  No noted distress or complaints of pain or nausea.

## 2018-02-11 NOTE — ED Provider Notes (Signed)
[facility] EMERGENCY DEPARTMENT Provider Note   CSN: [id number] Arrival date & time: 02/11/18  1358     History   Chief Complaint Chief Complaint  Patient presents with  . Abdominal Pain    HPI OLLIVANDER SEE is a 72 y.o. male.  Ozzie Knobel is a 72 year old male with history of recent hospitalization for acute cholecystitis.  His past medical history is also significant for metastatic prostate cancer he is actively getting chemotherapy.  Patient was discharged from the hospital on 12/11.  During that hospitalization he was treated with broad-spectrum antibiotics Vanco and Zosyn.  Patient was found to not be a good surgical candidate and was awaiting IR drain placement for his gallbladder.  Patient has not been able tolerate p.o. over the past 24 hours.  He has had increasing vomiting (NBNB), diarrhea, abdominal pain.  His abdominal pain is generalized.       Past Medical History:  Diagnosis Date  . Diabetes mellitus without complication ([place])   . Hypertension   . Prostate cancer ([place]) 09/05/2015  . 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 Sleep apnea   . Thyroid disease     Patient Active Problem List   Diagnosis Date Noted  . History of pulmonary embolism 02/07/2018  . Current chronic use of systemic steroids 02/07/2018  . 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 Intractable nausea and vomiting 02/07/2018  . Acute cholecystitis   . Subdural hematoma ([place]) 10/07/2017  . Acute pulmonary embolism ([place]) 10/06/2017  . Sleep apnea 10/06/2017  . Hypertension 10/06/2017  . Thyroid disease 10/06/2017  . Diabetes mellitus without complication ([place]) 74/25/9563  . Gastroesophageal reflux disease 03/13/2016  . 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 Nausea 08/21/2015  . SOB (shortness of breath) on exertion 08/21/2015  . 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  Osteoarthritis of right knee 01/16/2014    Past Surgical History:  Procedure Laterality Date  . CATARACT EXTRACTION    . PORTACATH PLACEMENT Left 12/31/2015   Procedure: INSERTION PORT-A-CATH LEFT SUBCLAVIAN;  Surgeon: Aviva Signs, MD;  Location: [facility];  Service: General;  Laterality: Left;  . REPLACEMENT TOTAL KNEE Left         Home Medications    Prior to Admission medications   Medication Sig Start Date End Date Taking? Authorizing Provider  amoxicillin-clavulanate (AUGMENTIN) 875-125 MG tablet Take 1 tablet by mouth 2 (two) times daily. 02/10/18   Patrecia Pour, MD  atenolol (TENORMIN) 100 MG tablet Take 100 mg by mouth daily.    [provider]  atorvastatin (LIPITOR) 20 MG tablet Take 10 mg by mouth every morning.     [provider]  Calcium Carb-Cholecalciferol (CALCIUM PLUS VITAMIN D3) 600-500 MG-UNIT CAPS Take 1,200 mg by mouth daily.     [provider]  chlorhexidine (PERIDEX) 0.12 % solution Use as directed 15 mLs in the mouth or throat 2 (two) times daily.    [provider]  cyclobenzaprine (FLEXERIL) 10 MG tablet Take 10 mg by mouth 3 (three) times daily as needed for muscle spasms.    [provider]  diltiazem (CARDIZEM CD) 300 MG 24 hr capsule Take 300 mg by mouth daily.    [provider]  Diphenhyd-Hydrocort-Nystatin (FIRST-DUKES MOUTHWASH) SUSP Use as directed 15 mLs in the mouth or throat 4 (four) times daily as needed. 12/28/17   Derek Jack, MD  gabapentin (  NEURONTIN) 300 MG capsule Patient may take 1 capsule in the morning and 2 capsules @ bedtime. Patient taking differently: Take 300 mg by mouth 2 (two) times daily. Patient may take 1 capsule in the morning and 2 capsules @ bedtime. 10/19/17   Lockamy, Theresia Lo, NP-C  Glucosamine-Chondroit-Vit C-Mn (GLUCOSAMINE 1500 COMPLEX PO) Take 1 tablet by mouth 2 (two) times daily.     [provider]  leuprolide (LUPRON) 7.5 MG injection Inject 7.5  mg into the muscle every 28 (twenty-eight) days.    [provider]  levothyroxine (SYNTHROID, LEVOTHROID) 25 MCG tablet Take 25 mcg by mouth daily before breakfast.    [provider]  loperamide (IMODIUM) 1 MG/5ML solution Take 1 mg by mouth as needed for diarrhea or loose stools.    [provider]  metFORMIN (GLUCOPHAGE) 500 MG tablet Take 500 mg by mouth 2 (two) times daily with a meal.    [provider]  morphine (MS CONTIN) 60 MG 12 hr tablet Take 1 tablet (60 mg total) by mouth every 12 (twelve) hours. 01/18/18   Lockamy, Randi L, NP-C  morphine (MSIR) 30 MG tablet Take 1 tablet (30 mg total) by mouth every 6 (six) hours as needed for severe pain. 01/18/18   Glennie Isle, NP-C  Multiple Vitamin (MULTIVITAMIN WITH MINERALS) TABS tablet Take 1 tablet by mouth daily.    [provider]  Omega-3 Fatty Acids (FISH OIL PO) Take 1 capsule by mouth daily. 03/21/16   [provider]  ondansetron (ZOFRAN-ODT) 8 MG disintegrating tablet Take 1 tablet (8 mg total) by mouth every 8 (eight) hours as needed for nausea or vomiting. 11/30/17   Lockamy, Randi L, NP-C  potassium chloride SA (KLOR-CON M20) 20 MEQ tablet TAKE 20 MEQ BY MOUTH ONCE DAILY Patient taking differently: Take 20 mEq by mouth daily. TAKE 20 MEQ BY MOUTH ONCE DAILY 11/10/17   Derek Jack, MD  predniSONE (DELTASONE) 5 MG tablet Take 1 tablet (5 mg total) by mouth 2 (two) times daily. Patient taking differently: Take 7.5 mg by mouth daily.  12/24/17   Derek Jack, MD  rivaroxaban (XARELTO) 10 MG TABS tablet Take 1 tablet (10 mg total) by mouth daily. 01/19/18   Lockamy, Randi L, NP-C  tamsulosin (FLOMAX) 0.4 MG CAPS capsule Take 0.8 mg by mouth every morning.     [provider]  traMADol (ULTRAM) 50 MG tablet Take 50 mg by mouth daily.    [provider]  triamterene-hydrochlorothiazide (MAXZIDE-25) 37.5-25 MG tablet Take 1 tablet by mouth daily. for  high blood pressure 03/25/16   [provider]  vitamin E 400 UNIT capsule Take 800 Units by mouth daily.    [provider]  prochlorperazine (COMPAZINE) 10 MG tablet Take 1 tablet (10 mg total) by mouth every 6 (six) hours as needed (Nausea or vomiting). Patient taking differently: Take 10 mg by mouth every 6 (six) hours as needed for nausea or vomiting.  01/16/16 08/27/17  Penland, Kelby Fam, MD    Family History Family History  Problem Relation Age of Onset  . Renal Disease Father   . Vascular Disease Father        Carotid artery disease  . Hypertension Brother     Social History Social History   Tobacco Use  . Smoking status: Never Smoker  . Smokeless tobacco: Never Used  Substance Use Topics  . Alcohol use: Not Currently    Comment: 1 beer each month  . Drug  use: No     Allergies   Patient has no known allergies.   Review of Systems Review of Systems  Constitutional: Negative for diaphoresis and fever.  Respiratory: Positive for shortness of breath. Negative for chest tightness.   Cardiovascular: Negative for chest pain.  Gastrointestinal: Positive for abdominal pain, diarrhea, nausea and vomiting. Negative for blood in stool and constipation.  All other systems reviewed and are negative.    Physical Exam Updated Vital Signs BP 126/87 (BP Location: Right Arm)   Pulse (!) 102   Temp 98.1 F (36.7 C) (Oral)   Resp 17   Ht 5\' 10"  (1.778 m)   Wt 90.7 kg   SpO2 100%   BMI 28.70 kg/m   Physical Exam Constitutional:      General: He is not in acute distress. HENT:     Head: Normocephalic and atraumatic.  Cardiovascular:     Rate and Rhythm: Normal rate and regular rhythm.  Pulmonary:     Effort: Pulmonary effort is normal.     Breath sounds: Normal breath sounds. No stridor.  Abdominal:     General: There is no distension.     Palpations: Abdomen is soft.     Tenderness: There is generalized abdominal tenderness.     Hernia: No  hernia is present.  Skin:    General: Skin is warm and dry.  Neurological:     General: No focal deficit present.     Mental Status: He is alert and oriented to person, place, and time.  Psychiatric:        Mood and Affect: Mood normal.        Behavior: Behavior normal.      ED Treatments / Results  Labs (all labs ordered are listed, but only abnormal results are displayed) Labs Reviewed  C DIFFICILE QUICK SCREEN W PCR REFLEX    EKG None  Radiology No results found.  Procedures Procedures (including critical care time)  Medications Ordered in ED Medications  sodium chloride 0.9 % bolus 1,000 mL (has no administration in time range)  fentaNYL (SUBLIMAZE) injection 50 mcg (has no administration in time range)  ondansetron (ZOFRAN) 8 mg in sodium chloride 0.9 % 50 mL IVPB (has no administration in time range)     Initial Impression / Assessment and Plan / ED Course  I have reviewed the triage vital signs and the nursing notes.  Pertinent labs & imaging results that were available during my care of the patient were reviewed by me and considered in my medical decision making (see chart for details).     Patient presenting with ongoing cholecystitis associated with abdominal pain, vomiting, diarrhea.  Unable to tolerate p.o.  Recently discharged with hospital and was awaiting drain placement.  Will start on IV fluids, antiemetic, analgesia.  Given patient's new onset diarrhea and recent treatment with antibiotics during his hospitalization, will also screen for C. difficile, however feel this is unlikely.  Plan to consult general surgery for further recommendations on care for patient.  Interval update 3:45 PM Spoke with general surgery Dr. Constance Haw who had seen the patient at prior hospitalization.  Recommended consult to medicine for admission for control of symptoms.  General surgery plans to see in the a.m.  Also recommended medicine consult IR to try and get patient's  drain placed. CBC, CMP at baseline from prior admission.   Interval update 4:30 PM Spoke with internal medicine at any point.  Recommends consult interventional radiology and general surgery at  [facility] can be further planning of admission.  Will consult interventional radiology.  Interval update 4:45 PM Dr. Gilmer Mor with interventional radiology at [facility].  Recommends patient get HIDA scan to evaluate gallbladder EF.  After scan is performed, recommend reconsult but via phone.  Spoke with hospitalist.  She is in agreement this plan.  Admit patient for pain control, nausea and vomiting. Final Clinical Impressions(s) / ED Diagnoses   Final diagnoses:  None    ED Discharge Orders    None       Bonnita Hollow, MD 02/11/18 1645    Margette Fast, MD 02/12/18 1623

## 2018-02-11 NOTE — Plan of Care (Signed)
Per Gabriel Cirri in [place], she spoke to ordering physician, pt to be done in AM. Nothing to eat or drink after midnight, no pain meds after midnight. Informed ER nurse, Sharyn Lull.

## 2018-02-11 NOTE — H&P (Signed)
History and Physical    Micheal Davis BDZ:[id number] DOB: [birth year] DOA: 02/11/2018  PCP: [name], MD Patient coming from: Home  Chief Complaint: Abdominal pain, intractable nausea and vomiting  HPI: Micheal Davis is a 72 y.o. 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  History provided by patient and wife at bedside.  He was discharged from the hospital yesterday afternoon and was feeling okay.  He was able to tolerate soft foods until yesterday.  He woke up early this morning and started vomiting again.  He has been having severe epigastric/right upper quadrant abdominal pain.  He has had 6 episodes of vomiting today.  Denies having any fevers but feels cold.  Patient was recently admitted from February 07, 2018 to February 10, 2018 for intractable nausea and vomiting secondary to acute cholecystitis.  Right upper quadrant ultrasound done at that time with findings consistent with acute cholecystitis; no evidence of biliary ductal dilatation. CT abdomen pelvis showing significant interval increase in the number and size of hepatic metastatic disease compared to prior CT. Patient was seen by general surgery and thought not to be a surgical candidate.  He was discharged with Augmentin to complete a 14-day antibiotic course.  ED Course: Afebrile and no leukocytosis.  Lipase normal.  AST borderline elevated at 46, improved since previous labs. Alkaline phosphatase 259, not significantly changed since labs done during his prior hospitalization.  ALT normal.  T bili borderline elevated at 1.3. ED provider spoke to Dr. Constance Haw from general surgery who recommended cholecystostomy tube with IR and no plans for surgery at this time.  ED provider discussed the case with Dr. Anselm Pancoast from IR at  [facility] who recommended doing a HIDA scan first and then consulting IR over the phone to discuss what the next step in management would be.  Review of Systems: As per HPI otherwise 10 point review of systems negative.  Past Medical History:  Diagnosis Date  . Diabetes mellitus without complication ([place])   . Hypertension   . Prostate cancer ([place]) 09/05/2015  . Prostate cancer metastatic to bone ([place]) 09/05/2015  . Sleep apnea   . Thyroid disease     Past Surgical History:  Procedure Laterality Date  . CATARACT EXTRACTION    . PORTACATH PLACEMENT Left 12/31/2015   Procedure: INSERTION PORT-A-CATH LEFT SUBCLAVIAN;  Surgeon: Aviva Signs, MD;  Location: [facility];  Service: General;  Laterality: Left;  . REPLACEMENT TOTAL KNEE Left      reports that he has never smoked. He has never used smokeless tobacco. He reports previous alcohol use. He reports that he does not use drugs.  No Known Allergies  Family History  Problem Relation Age of Onset  . Renal Disease Father   . Vascular Disease Father        Carotid artery disease  . Hypertension Brother     Prior to Admission medications   Medication Sig Start Date End Date Taking? Authorizing Provider  amoxicillin-clavulanate (AUGMENTIN) 875-125 MG tablet Take 1 tablet by mouth 2 (two) times daily. 02/10/18  Yes Patrecia Pour, MD  atenolol (TENORMIN) 100 MG tablet Take 100 mg by mouth daily.   Yes [provider]  atorvastatin (LIPITOR) 20 MG tablet Take 10 mg by mouth every morning.    Yes [provider]  Calcium Carb-Cholecalciferol (  CALCIUM PLUS VITAMIN D3) 600-500 MG-UNIT CAPS Take 1,200 mg by mouth daily.    Yes [provider]  chlorhexidine (PERIDEX) 0.12 % solution Use as directed 15 mLs in the mouth or throat 2 (two) times daily.   Yes [provider]  cyclobenzaprine (FLEXERIL) 10 MG tablet Take 10 mg by mouth 3 (three) times daily as needed for muscle spasms.   Yes [provider]    diltiazem (CARDIZEM CD) 300 MG 24 hr capsule Take 300 mg by mouth daily.   Yes [provider]  gabapentin (NEURONTIN) 300 MG capsule Patient may take 1 capsule in the morning and 2 capsules @ bedtime. Patient taking differently: Take 300 mg by mouth 2 (two) times daily. Patient may take 1 capsule in the morning and 2 capsules @ bedtime. 10/19/17  Yes Lockamy, Randi L, NP-C  Glucosamine-Chondroit-Vit C-Mn (GLUCOSAMINE 1500 COMPLEX PO) Take 1 tablet by mouth 2 (two) times daily.    Yes [provider]  levothyroxine (SYNTHROID, LEVOTHROID) 25 MCG tablet Take 25 mcg by mouth daily before breakfast.   Yes [provider]  morphine (MS CONTIN) 60 MG 12 hr tablet Take 1 tablet (60 mg total) by mouth every 12 (twelve) hours. 01/18/18  Yes Lockamy, Randi L, NP-C  morphine (MSIR) 30 MG tablet Take 1 tablet (30 mg total) by mouth every 6 (six) hours as needed for severe pain. 01/18/18  Yes Lockamy, Randi L, NP-C  Multiple Vitamin (MULTIVITAMIN WITH MINERALS) TABS tablet Take 1 tablet by mouth daily.   Yes [provider]  Omega-3 Fatty Acids (FISH OIL PO) Take 1 capsule by mouth daily. 03/21/16  Yes [provider]  ondansetron (ZOFRAN-ODT) 8 MG disintegrating tablet Take 1 tablet (8 mg total) by mouth every 8 (eight) hours as needed for nausea or vomiting. 11/30/17  Yes Lockamy, Randi L, NP-C  potassium chloride SA (KLOR-CON M20) 20 MEQ tablet TAKE 20 MEQ BY MOUTH ONCE DAILY Patient taking differently: Take 20 mEq by mouth daily. TAKE 20 MEQ BY MOUTH ONCE DAILY 11/10/17  Yes Derek Jack, MD  predniSONE (DELTASONE) 5 MG tablet Take 1 tablet (5 mg total) by mouth 2 (two) times daily. Patient taking differently: Take 7.5 mg by mouth daily.  12/24/17  Yes Derek Jack, MD  rivaroxaban (XARELTO) 10 MG TABS tablet Take 1 tablet (10 mg total) by mouth daily. 01/19/18  Yes Lockamy, Randi L, NP-C  tamsulosin (FLOMAX) 0.4 MG CAPS capsule Take 0.8 mg by mouth  every morning.    Yes [provider]  triamterene-hydrochlorothiazide (MAXZIDE-25) 37.5-25 MG tablet Take 1 tablet by mouth daily. for high blood pressure 03/25/16  Yes [provider]  vitamin E 400 UNIT capsule Take 800 Units by mouth daily.   Yes [provider]  loperamide (IMODIUM) 1 MG/5ML solution Take 1 mg by mouth as needed for diarrhea or loose stools.    [provider]  prochlorperazine (COMPAZINE) 10 MG tablet Take 1 tablet (10 mg total) by mouth every 6 (six) hours as needed (Nausea or vomiting). Patient taking differently: Take 10 mg by mouth every 6 (six) hours as needed for nausea or vomiting.  01/16/16 08/27/17  Patrici Ranks, MD    Physical Exam: Vitals:   02/11/18 1530 02/11/18 1600 02/11/18 1630 02/11/18 1700  BP: 134/68 (!) 126/55 131/76 (!) 111/56  Pulse:      Resp:      Temp:      TempSrc:      SpO2:  Weight:      Height:        Physical Exam  Constitutional: He is oriented to person, place, and time. He appears distressed.  Lying in the left lateral decubitus position with a vomit bag next to him  HENT:  Head: Normocephalic.  Mouth/Throat: Oropharynx is clear and moist.  Eyes: Right eye exhibits no discharge. Left eye exhibits no discharge.  Neck: Neck supple.  Cardiovascular: Normal rate, regular rhythm and intact distal pulses.  Pulmonary/Chest: Effort normal and breath sounds normal. No respiratory distress. He has no wheezes. He has no rales.  Abdominal: Soft. Bowel sounds are normal. He exhibits no distension. There is abdominal tenderness. There is no rebound and no guarding.  Epigastrium and right upper quadrant tender to palpation  Musculoskeletal:        General: No edema.  Neurological: He is alert and oriented to person, place, and time.  Skin: Skin is warm and dry. He is not diaphoretic.     Labs on Admission: I have personally reviewed following labs and imaging studies  CBC: Recent Labs  Lab  02/07/18 0151 02/08/18 0640 02/09/18 0000 02/09/18 0539 02/11/18 1410  WBC 9.3 5.8 5.2 5.4 8.0  NEUTROABS 7.9*  --   --   --  7.1  HGB 9.2* 8.6* 7.8* 8.6* 9.3*  HCT 30.3* 28.6* 25.9* 28.8* 30.9*  MCV 94.7 95.3 95.2 95.7 92.2  PLT 233 210 173 201 683   Basic Metabolic Panel: Recent Labs  Lab 02/07/18 0151 02/08/18 0640 02/09/18 0539 02/10/18 0000 02/11/18 1410  NA 134* 138 137 134* 135  K 3.6 3.3* 3.3* 3.7 3.3*  CL 101 102 103 102 103  CO2 22 26 24 23 22   GLUCOSE 173* 124* 147* 176* 218*  BUN 15 13 12 10 11   CREATININE 0.66 0.66 0.68 0.58* 0.56*  CALCIUM 8.4* 6.7* 6.2* 6.5* 6.9*  MG  --   --   --   --  2.0   GFR: Estimated Creatinine Clearance: 93.2 mL/min (A) (by C-G formula based on SCr of 0.56 mg/dL (L)). Liver Function Tests: Recent Labs  Lab 02/07/18 0151 02/08/18 0640 02/09/18 0539 02/10/18 0000 02/11/18 1410  AST 84* 150* 100* 61* 46*  ALT 34 36 29 27 31   ALKPHOS 284* 277* 277* 250* 259*  BILITOT 0.7 0.9 1.0 0.6 1.3*  PROT 6.5 5.7* 5.7* 5.2* 6.1*  ALBUMIN 3.1* 2.7* 2.6* 2.2* 2.5*   Recent Labs  Lab 02/07/18 0151 02/11/18 1410  LIPASE 24 21   No results for input(s): AMMONIA in the last 168 hours. Coagulation Profile: No results for input(s): INR, PROTIME in the last 168 hours. Cardiac Enzymes: No results for input(s): CKTOTAL, CKMB, CKMBINDEX, TROPONINI in the last 168 hours. BNP (last 3 results) No results for input(s): PROBNP in the last 8760 hours. HbA1C: No results for input(s): HGBA1C in the last 72 hours. CBG: No results for input(s): GLUCAP in the last 168 hours. Lipid Profile: No results for input(s): CHOL, HDL, LDLCALC, TRIG, CHOLHDL, LDLDIRECT in the last 72 hours. Thyroid Function Tests: No results for input(s): TSH, T4TOTAL, FREET4, T3FREE, THYROIDAB in the last 72 hours. Anemia Panel: No results for input(s): VITAMINB12, FOLATE, FERRITIN, TIBC, IRON, RETICCTPCT in the last 72 hours. Urine analysis:    Component Value  Date/Time   COLORURINE YELLOW 02/07/2018 0308   APPEARANCEUR CLEAR 02/07/2018 0308   LABSPEC 1.018 02/07/2018 0308   PHURINE 7.0 02/07/2018 0308   GLUCOSEU NEGATIVE 02/07/2018 0308   HGBUR NEGATIVE  02/07/2018 0308   BILIRUBINUR NEGATIVE 02/07/2018 0308   KETONESUR NEGATIVE 02/07/2018 0308   PROTEINUR NEGATIVE 02/07/2018 0308   UROBILINOGEN 1.0 09/27/2010 1055   NITRITE NEGATIVE 02/07/2018 0308   LEUKOCYTESUR NEGATIVE 02/07/2018 0308    Radiological Exams on Admission: No results found.  Assessment/Plan Principal Problem:   Intractable nausea and vomiting Active Problems:   Prostate cancer metastatic to bone (HCC)   Hypertension   Thyroid disease   Diabetes mellitus without complication (HCC)   History of pulmonary embolus (PE)   Current chronic use of systemic steroids   Acute cholecystitis   Hypokalemia   Chronic anemia   Physical deconditioning   Intractable nausea and vomiting secondary to acute cholecystitis -Right upper quadrant ultrasound done during recent admission with findings consistent with acute cholecystitis; no evidence of biliary ductal dilatation. CT abdomen pelvis done at that time showing significant interval increase in the number and size of hepatic metastatic disease compared to prior CT. Patient was seen by general surgery and thought not to be a surgical candidate.  He was discharged 12/11 with Augmentin to complete a 14-day antibiotic course. -Continues to have severe epigastric/right upper quadrant abdominal pain and intractable nausea and vomiting. Afebrile and no leukocytosis.  Lipase normal.  AST borderline elevated at 46, improved since previous labs. Alkaline phosphatase 259, not significantly changed since labs done during his prior hospitalization.  ALT normal.  T bili borderline elevated at 1.3.  -ED provider spoke to Dr. Constance Haw from general surgery who recommended cholecystostomy tube with IR and no plans for surgery at this time.  They also  discussed the case with Dr. Anselm Pancoast from IR at [facility] who recommended doing a HIDA scan first and then consulting IR over the phone to discuss what the next step in management would be. -HIDA scan has been ordered -Dilaudid 0.5 mg every 3 hours as needed -Zosyn -IV Zofran as needed -IV Phenergan as needed -IV fluid hydration -Repeat hepatic function panel in the morning  Mild hypokalemia Likely secondary to vomiting.  Potassium 3.3. -Replete potassium -Check magnesium level -BMP in a.m.  History of PE -Continue home Xarelto.  No plans for surgery at this time.  Chronic prednisone use -IV Solu-Cortef 50 mg every 8 hours -Hold home prednisone  History of prostate cancer with bone and liver mets Followed by oncology, on chemotherapy.  CT abdomen pelvis done on February 07, 2018 showing significant interval increase in the number and size of hepatic metastatic disease compared to prior CT. -Discuss with oncology in the morning.  Chronic anemia -Hemoglobin 9.3, at baseline.  Type 2 diabetes -Hold sliding scale insulin at this time as patient is not able to tolerate p.o. intake -CBG checks  Hypertension -Currently normotensive.  Hold home antihypertensives at this time.  Hypothyroidism -Continue home Synthroid in IV form  Physical deconditioning -PT evaluation  DVT prophylaxis: Xarelto Code Status: Patient wishes to be DNR. Family Communication: Wife at bedside. Disposition Plan: Anticipate discharge after clinical improvement. Consults called: General surgery, IR Admission status: It is my clinical opinion that admission to INPATIENT is reasonable and necessary in this 72 y.o. male . presenting with symptoms of intractable nausea and vomiting, concerning for acute cholecystitis . in the context of PMH including: Recently diagnosed acute cholecystitis, not a surgical candidate . with pertinent positives on physical exam including: Abdominal pain . and pertinent positives on  radiographic and laboratory data including: Elevated LFTs . Workup and treatment include IV fluid hydration as patient is not able  to tolerate p.o. intake, IV pain medication, and IV antiemetics.  HIDA scan pending.  Given the aforementioned, the predictability of an adverse outcome is felt to be significant. I expect that the patient will require at least 2 midnights in the hospital to treat this condition.    Shela Leff MD [facility] Pager [phone]  If 7PM-7AM, please contact night-coverage [email or web address] Password [facility]  02/11/2018, 5:46 PM

## 2018-02-11 NOTE — Progress Notes (Signed)
Pharmacy Antibiotic Note  Micheal Davis is a 72 y.o. male admitted on 02/11/2018 with intra abdominal infection.  Pharmacy has been consulted for zosyn dosing.  Plan: Zosyn 3.375g IV q8h (4 hour infusion).  Height: 5\' 10"  (177.8 cm) Weight: 200 lb (90.7 kg) IBW/kg (Calculated) : 73  Temp (24hrs), Avg:98.1 F (36.7 C), Min:98.1 F (36.7 C), Max:98.1 F (36.7 C)  Recent Labs  Lab 02/07/18 0151 02/08/18 0640 02/09/18 0000 02/09/18 0539 02/10/18 0000 02/11/18 1410  WBC 9.3 5.8 5.2 5.4  --  8.0  CREATININE 0.66 0.66  --  0.68 0.58* 0.56*    Estimated Creatinine Clearance: 93.2 mL/min (A) (by C-G formula based on SCr of 0.56 mg/dL (L)).    No Known Allergies  Antimicrobials this admission: 12/12 zosyn >>   Microbiology results: 12/12 C diff > sent   Thank you for allowing pharmacy to be a part of this patient's care.  Donna Christen Kay Ricciuti 02/11/2018 5:31 PM

## 2018-02-11 NOTE — ED Triage Notes (Signed)
PT c/o continued upper abdominal pain with nausea and vomiting. PT was admitted on 02/07/18-02/10/18 and was told his gallbladder was diseased but he is not a surgical candidate and is supposed to follow back up with general surgery for a drain placement. PT has prostate cancer with mets and takes chemo q21 days at [place] [facility].

## 2018-02-11 NOTE — Progress Notes (Signed)
ANTICOAGULATION CONSULT NOTE - Follow Up Consult  Pharmacy Consult for xarelto Indication: Indefinite anticoagulation (reduced intensity dosing for prophylaxis against venous thromboembolism recurrence)  No Known Allergies  Patient Measurements: Height: 5\' 10"  (177.8 cm) Weight: 200 lb (90.7 kg) IBW/kg (Calculated) : 73  Vital Signs: Temp: 98.1 F (36.7 C) (12/12 1408) Temp Source: Oral (12/12 1408) BP: 131/76 (12/12 1630) Pulse Rate: 100 (12/12 1430)  Labs: Recent Labs    02/09/18 0000 02/09/18 0539 02/10/18 0000 02/11/18 1410  HGB 7.8* 8.6*  --  9.3*  HCT 25.9* 28.8*  --  30.9*  PLT 173 201  --  203  CREATININE  --  0.68 0.58* 0.56*    Estimated Creatinine Clearance: 93.2 mL/min (A) (by C-G formula based on SCr of 0.56 mg/dL (L)).   Medications:  (Not in a hospital admission)  Scheduled:  . chlorhexidine  15 mL Mouth/Throat BID  . hydrocortisone sod succinate (SOLU-CORTEF) inj  50 mg Intravenous Q8H  . [START ON 02/12/2018] levothyroxine  12.5 mcg Intravenous Daily  . rivaroxaban  10 mg Oral Q supper   Infusions:  . 0.9 % NaCl with KCl 20 mEq / L    . potassium chloride Stopped (02/11/18 1709)   PRN: HYDROmorphone (DILAUDID) injection, ondansetron (ZOFRAN) IV, promethazine Anti-infectives (From admission, onward)   None      Assessment: Spoke with Dr Marlowe Sax, she would like home dose of xarelto 10mg  continued for indefinite anticoagulation (reduced intensity dosing for prophylaxis against venous thromboembolism recurrence). Patient is not currently a candidate for surgery. Would appreciate pharmacy's help in monitoring for S&S of bleeding.   Goal of Therapy:  anticoagulation with xarelto Monitor platelets by anticoagulation protocol: Yes   Plan:  Xarelto 10mg  po qsupper Monitor CBC for signs of bleeding   Donna Christen Makaylie Dedeaux 02/11/2018,5:27 PM

## 2018-02-11 NOTE — Progress Notes (Signed)
[facility]  Patient in ED. Acute cholecystitis in the setting of protracted course 2+ weeks of pain and extensive liver metastasis. Worsening abdominal pain and nausea/vomiting. Will need cholecystostomy tube with IR. Recommend medicine admission and IR consultation.   No plans for surgery at this time.  [name], MD [facility] [address] [place],  [phone] (office)

## 2018-02-12 ENCOUNTER — Inpatient Hospital Stay (HOSPITAL_COMMUNITY): Payer: Medicare Other

## 2018-02-12 ENCOUNTER — Encounter (HOSPITAL_COMMUNITY): Payer: Self-pay

## 2018-02-12 DIAGNOSIS — Z86711 Personal history of pulmonary embolism: Secondary | ICD-10-CM

## 2018-02-12 DIAGNOSIS — Z7952 Long term (current) use of systemic steroids: Secondary | ICD-10-CM

## 2018-02-12 DIAGNOSIS — C61 Malignant neoplasm of prostate: Secondary | ICD-10-CM

## 2018-02-12 DIAGNOSIS — K810 Acute cholecystitis: Principal | ICD-10-CM

## 2018-02-12 DIAGNOSIS — E079 Disorder of thyroid, unspecified: Secondary | ICD-10-CM

## 2018-02-12 LAB — BASIC METABOLIC PANEL
ANION GAP: 10 (ref 5–15)
BUN: 9 mg/dL (ref 8–23)
CHLORIDE: 107 mmol/L (ref 98–111)
CO2: 20 mmol/L — ABNORMAL LOW (ref 22–32)
Calcium: 6.6 mg/dL — ABNORMAL LOW (ref 8.9–10.3)
Creatinine, Ser: 0.49 mg/dL — ABNORMAL LOW (ref 0.61–1.24)
GFR calc Af Amer: >60 mL/min (ref >60)
GFR calc non Af Amer: >60 mL/min (ref >60)
Glucose, Bld: 159 mg/dL — ABNORMAL HIGH (ref 70–99)
Potassium: 3.3 mmol/L — ABNORMAL LOW (ref 3.5–5.1)
Sodium: 137 mmol/L (ref 135–145)

## 2018-02-12 LAB — GLUCOSE, CAPILLARY
Glucose-Capillary: 232 mg/dL — ABNORMAL HIGH (ref 70–99)
Glucose-Capillary: 160 mg/dL — ABNORMAL HIGH (ref 70–99)

## 2018-02-12 LAB — CBC
HCT: 29.4 % — ABNORMAL LOW (ref 39.0–52.0)
HEMOGLOBIN: 9.2 g/dL — AB (ref 13.0–17.0)
MCH: 28.7 pg (ref 26.0–34.0)
MCHC: 31.3 g/dL (ref 30.0–36.0)
MCV: 91.6 fL (ref 80.0–100.0)
Platelets: 218 10*3/uL (ref 150–400)
RBC: 3.21 MIL/uL — ABNORMAL LOW (ref 4.22–5.81)
RDW: 19.4 % — ABNORMAL HIGH (ref 11.5–15.5)
WBC: 7.3 10*3/uL (ref 4.0–10.5)
nRBC: 0.4 % — ABNORMAL HIGH (ref 0.0–0.2)

## 2018-02-12 LAB — HEPATIC FUNCTION PANEL
ALT: 28 U/L (ref 0–44)
AST: 36 U/L (ref 15–41)
Albumin: 2.2 g/dL — ABNORMAL LOW (ref 3.5–5.0)
Alkaline Phosphatase: 222 U/L — ABNORMAL HIGH (ref 38–126)
Bilirubin, Direct: 0.4 mg/dL — ABNORMAL HIGH (ref 0.0–0.2)
Indirect Bilirubin: 0.8 mg/dL (ref 0.3–0.9)
Total Bilirubin: 1.2 mg/dL (ref 0.3–1.2)
Total Protein: 5.5 g/dL — ABNORMAL LOW (ref 6.5–8.1)

## 2018-02-12 MED ORDER — POTASSIUM CHLORIDE IN NACL 20-0.9 MEQ/L-% IV SOLN
INTRAVENOUS | Status: AC
  Administered 2018-02-13: 01:00:00 via INTRAVENOUS

## 2018-02-12 MED ORDER — MORPHINE SULFATE (PF) 4 MG/ML IV SOLN
3.6000 mg | Freq: Once | INTRAVENOUS | Status: AC
  Administered 2018-02-12: 3.6 mg via INTRAVENOUS
  Filled 2018-02-12: qty 1

## 2018-02-12 MED ORDER — PROMETHAZINE HCL 25 MG/ML IJ SOLN
12.5000 mg | Freq: Four times a day (QID) | INTRAMUSCULAR | Status: DC | PRN
  Administered 2018-02-12 – 2018-02-16 (×14): 12.5 mg via INTRAVENOUS
  Filled 2018-02-12 (×14): qty 1

## 2018-02-12 MED ORDER — TECHNETIUM TC 99M MEBROFENIN IV KIT
5.0000 | PACK | Freq: Once | INTRAVENOUS | Status: AC | PRN
  Administered 2018-02-12: 5.5 via INTRAVENOUS

## 2018-02-12 MED ORDER — HYDROMORPHONE HCL 1 MG/ML IJ SOLN
1.0000 mg | INTRAMUSCULAR | Status: DC | PRN
  Administered 2018-02-12 – 2018-02-15 (×22): 1 mg via INTRAVENOUS
  Filled 2018-02-12 (×22): qty 1

## 2018-02-12 MED ORDER — HEPARIN SODIUM (PORCINE) 5000 UNIT/ML IJ SOLN
5000.0000 [IU] | Freq: Three times a day (TID) | INTRAMUSCULAR | Status: DC
  Administered 2018-02-12 – 2018-02-13 (×2): 5000 [IU] via SUBCUTANEOUS
  Filled 2018-02-12 (×4): qty 1

## 2018-02-12 MED ORDER — INSULIN ASPART 100 UNIT/ML ~~LOC~~ SOLN
0.0000 [IU] | Freq: Three times a day (TID) | SUBCUTANEOUS | Status: DC
  Administered 2018-02-12: 3 [IU] via SUBCUTANEOUS
  Administered 2018-02-13: 2 [IU] via SUBCUTANEOUS
  Administered 2018-02-13 – 2018-02-15 (×2): 1 [IU] via SUBCUTANEOUS

## 2018-02-12 MED ORDER — INSULIN DETEMIR 100 UNIT/ML ~~LOC~~ SOLN
5.0000 [IU] | Freq: Every day | SUBCUTANEOUS | Status: DC
  Administered 2018-02-12 – 2018-02-15 (×4): 5 [IU] via SUBCUTANEOUS
  Filled 2018-02-12 (×5): qty 0.05

## 2018-02-12 MED ORDER — PROMETHAZINE HCL 25 MG/ML IJ SOLN: 25.0000 mg | Freq: Four times a day (QID) | INTRAMUSCULAR | Status: DC | PRN

## 2018-02-12 NOTE — Progress Notes (Signed)
[facility] Progress Note     Subjective: Patient with worsening RUQ pain, nausea/vomiting that required readmission. HIDA was ordered by hospitalist, and confirms acute cholecystitis.  No complaints currently, no nausea.   Objective: Vital signs in last 24 hours: Temp:  [97.5 F (36.4 C)-98.5 F (36.9 C)] 98.5 F (36.9 C) (12/13 0644) Pulse Rate:  [92-104] 104 (12/13 0644) Resp:  [16-18] 16 (12/13 0644) BP: (111-137)/(55-91) 133/91 (12/13 0644) SpO2:  [95 %-100 %] 100 % (12/13 0644) Weight:  [90.7 kg-92.7 kg] 92.7 kg (12/12 1850) Last BM Date: 02/11/18  Intake/Output from previous day: 12/12 0701 - 12/13 0700 In: 81.1 [I.V.:31.1; IV Piggyback:50] Out: 1 [Emesis/NG output:1] Intake/Output this shift: No intake/output data recorded.  General appearance: alert, cooperative and no distress Resp: normal work breathing GI: soft, nondistended, tender in the RUQ, no rebound or guarding  Lab Results:  Recent Labs    02/11/18 1410 02/12/18 0644  WBC 8.0 7.3  HGB 9.3* 9.2*  HCT 30.9* 29.4*  PLT 203 218   BMET Recent Labs    02/11/18 1410 02/12/18 0441  NA 135 137  K 3.3* 3.3*  CL 103 107  CO2 22 20*  GLUCOSE 218* 159*  BUN 11 9  CREATININE 0.56* 0.49*  CALCIUM 6.9* 6.6*   HIDA personally reviewed- non filling of the gallbladder, consistent with cholecystitis  Studies/Results: Nm Hepato W/eject Fract  Result Date: 02/12/2018 CLINICAL DATA:  Cholelithiasis, RIGHT upper quadrant pain, nausea and vomiting for 1 week, abnormal ultrasound question acute cholecystitis EXAM: NUCLEAR MEDICINE HEPATOBILIARY IMAGING TECHNIQUE: Sequential images of the abdomen were obtained out to 60 minutes following intravenous administration of radiopharmaceutical. Morphine was administered after 1 hour due to nonvisualization of the gallbladder on initial images. RADIOPHARMACEUTICALS:  5.5 mCi Tc-5m  Choletec IV COMPARISON:  None Correlation: CT abdomen and pelvis  02/07/2018, ultrasound abdomen 02/07/2018 FINDINGS: Normal tracer extraction from bloodstream indicating normal hepatocellular function. Normal excretion of tracer into biliary tree. Gallbladder did not visualized during the first hour of imaging. Small bowel visualized at 16 minutes min. A large photopenic defect is seen within the central liver corresponding to a large hepatic metastasis identified by CT. Additional less well-defined areas of photopenia are seen within the remainder of the liver, corresponding to additional metastases. At 1 hour patient received 3.6 mg of morphine sulfate and imaging was continued for 30 minutes. Gallbladder failed to visualize following morphine augmentation. Findings are consistent with acute cholecystitis. IMPRESSION: Hepatic metastases. Patent CBD. Nonvisualization of the gallbladder despite morphine administration consistent with acute cholecystitis. Electronically Signed   By: Lavonia Dana M.D.   On: 02/12/2018 12:08    Anti-infectives: Anti-infectives (From admission, onward)   Start     Dose/Rate Route Frequency Ordered Stop   02/11/18 1745  piperacillin-tazobactam (ZOSYN) IVPB 3.375 g     3.375 g 12.5 mL/hr over 240 Minutes Intravenous Every 8 hours 02/11/18 1731        Assessment/Plan: Mr. Serna is a 72 yo with acute cholecystitis in the setting of liver metastasis and a 2 week history of abdominal pain who was  Readmitted after discharge with oral antibiotics.  He continues to have pain and will require a cholecystostomy tube to drain the gallbladder.  -IR drainage of gallbladder, discussed with wife and patient that this has the potential to be permanent given his underlying issues and the liver metastasis -Holding Xarelto for IR  -Can have diet as tolerated otherwise  -No plan for surgical intervention  Discussed with  Dr. Dyann Kief.    LOS: 1 day    Virl Cagey 02/12/2018

## 2018-02-12 NOTE — Progress Notes (Signed)
PT Cancellation Note  Patient Details Name: Micheal Davis MRN: [id number] DOB: February 28, 1946   Cancelled Treatment:    Reason Eval/Treat Not Completed: Patient declined, no reason specified. Patient had just returned from testing and hadn't had anything to eat or drink since midnight. Patient refused stating he just wants to see if he can keep some liquids down.    Floria Raveling. Hartnett-Rands, MS, PT [facility] [place] [phone] 02/12/2018, 2:21 PM

## 2018-02-12 NOTE — Progress Notes (Signed)
PROGRESS NOTE    Micheal Davis  MWU:[id number] DOB: 1945/03/12 DOA: 02/11/2018 PCP: [name], MD    Brief Narrative:  72 y.o. 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  History provided by patient and wife at bedside.  He was discharged from the hospital yesterday afternoon and was feeling okay.  He was able to tolerate soft foods until yesterday.  He woke up early this morning and started vomiting again.  He has been having severe epigastric/right upper quadrant abdominal pain.  He has had 6 episodes of vomiting today.  Denies having any fevers but feels chills.  Patient was recently admitted from February 07, 2018 to February 10, 2018 for intractable nausea and vomiting secondary to acute cholecystitis.  Right upper quadrant ultrasound done at that time with findings consistent with acute cholecystitis; no evidence of biliary ductal dilatation.   Patient was discharge but symptoms became unbearable and has been readmitted. General surgery and IR consulted. Will need cholecystostomy.   Assessment & Plan: 1-Intractable nausea and vomiting: in the setting of acute cholecystitis  -positive Hida scan -IR planning for cholecystostomy -will continue IV antibiotics -PRN antiemetics, analgesics and allow CLD -prognosis is guarded  2-Prostate cancer metastatic to bone ([place]) -prognosis is poor -continue outpatient follow up with oncology   3-hypokalemia -in the setting of poor oral intake and GI loses -continue repletion as needed  4-hx of PE -will hold xarelto for now -heparin for DVT prophylaxis   5-Thyroid disease -continue synthroid   6-Diabetes mellitus without complication (HCC)  7-Current chronic use of systemic steroids -continue solucortef   DVT  prophylaxis: heparin Code Status: DNR Family Communication: wife at bedside  Disposition Plan: remains inpatient. Will continue antiemetics and analgesics, allow CLQ. Continue IV antibiotics. Follow general surgery and IR rec's and stop anticoagulation.   Consultants:   General surgery  IR  Procedures:   Hida Scan: positive for cholecystitis   Antimicrobials:  Anti-infectives (From admission, onward)   Start     Dose/Rate Route Frequency Ordered Stop   02/11/18 1745  piperacillin-tazobactam (ZOSYN) IVPB 3.375 g     3.375 g 12.5 mL/hr over 240 Minutes Intravenous Every 8 hours 02/11/18 1731         Subjective: No fever. Reports still intermittent nausea/vomtiing and RUQ pain. Analgesics and antiemetics are helping, but feels pain meds not lasting enough.   Objective: Vitals:   02/11/18 1849 02/11/18 1850 02/11/18 2147 02/12/18 0644  BP: 135/81  119/66 (!) 133/91  Pulse: 100  92 (!) 104  Resp: 18  17 16   Temp: (!) 97.5 F (36.4 C)  97.8 F (36.6 C) 98.5 F (36.9 C)  TempSrc: Oral  Oral Oral  SpO2: 100%  95% 100%  Weight:  92.7 kg    Height:  5\' 10"  (1.778 m)      Intake/Output Summary (Last 24 hours) at 02/12/2018 1317 Last data filed at 02/12/2018 0301 Gross per 24 hour  Intake 81.09 ml  Output 1 ml  Net 80.09 ml   Filed Weights   02/11/18 1408 02/11/18 1850  Weight: 90.7 kg 92.7 kg    Examination: General exam: Alert, awake, oriented x 3; still experiencing nausea/vomiting intermittently and RUQ pain.  Respiratory system: Clear to auscultation. Respiratory effort normal. Cardiovascular system:RRR. No murmurs, rubs, gallops. Gastrointestinal system: Abdomen is nondistended,  soft and tender on RUQ. Positive BS Central nervous system: Alert and oriented. No focal neurological deficits. Extremities: No C/C/E, +pedal pulses Skin: No rashes, lesions or ulcers Psychiatry: Judgement and insight appear normal. Mood & affect appropriate.    Data Reviewed: I  have personally reviewed following labs and imaging studies  CBC: Recent Labs  Lab 02/07/18 0151 02/08/18 0640 02/09/18 0000 02/09/18 0539 02/11/18 1410 02/12/18 0644  WBC 9.3 5.8 5.2 5.4 8.0 7.3  NEUTROABS 7.9*  --   --   --  7.1  --   HGB 9.2* 8.6* 7.8* 8.6* 9.3* 9.2*  HCT 30.3* 28.6* 25.9* 28.8* 30.9* 29.4*  MCV 94.7 95.3 95.2 95.7 92.2 91.6  PLT 233 210 173 201 203 856   Basic Metabolic Panel: Recent Labs  Lab 02/08/18 0640 02/09/18 0539 02/10/18 0000 02/11/18 1410 02/12/18 0441  NA 138 137 134* 135 137  K 3.3* 3.3* 3.7 3.3* 3.3*  CL 102 103 102 103 107  CO2 26 24 23 22  20*  GLUCOSE 124* 147* 176* 218* 159*  BUN 13 12 10 11 9   CREATININE 0.66 0.68 0.58* 0.56* 0.49*  CALCIUM 6.7* 6.2* 6.5* 6.9* 6.6*  MG  --   --   --  2.0  --    GFR: Estimated Creatinine Clearance: 94.1 mL/min (A) (by C-G formula based on SCr of 0.49 mg/dL (L)).   Liver Function Tests: Recent Labs  Lab 02/08/18 0640 02/09/18 0539 02/10/18 0000 02/11/18 1410 02/12/18 0441  AST 150* 100* 61* 46* 36  ALT 36 29 27 31 28   ALKPHOS 277* 277* 250* 259* 222*  BILITOT 0.9 1.0 0.6 1.3* 1.2  PROT 5.7* 5.7* 5.2* 6.1* 5.5*  ALBUMIN 2.7* 2.6* 2.2* 2.5* 2.2*   Recent Labs  Lab 02/07/18 0151 02/11/18 1410  LIPASE 24 21   CBG: Recent Labs  Lab 02/11/18 2151  GLUCAP 166*   Urine analysis:    Component Value Date/Time   COLORURINE YELLOW 02/07/2018 0308   APPEARANCEUR CLEAR 02/07/2018 0308   LABSPEC 1.018 02/07/2018 0308   PHURINE 7.0 02/07/2018 0308   GLUCOSEU NEGATIVE 02/07/2018 0308   HGBUR NEGATIVE 02/07/2018 0308   BILIRUBINUR NEGATIVE 02/07/2018 0308   KETONESUR NEGATIVE 02/07/2018 0308   PROTEINUR NEGATIVE 02/07/2018 0308   UROBILINOGEN 1.0 09/27/2010 1055   NITRITE NEGATIVE 02/07/2018 0308   LEUKOCYTESUR NEGATIVE 02/07/2018 0308   Radiology Studies: Nm Hepato W/eject Fract  Result Date: 02/12/2018 CLINICAL DATA:  Cholelithiasis, RIGHT upper quadrant pain, nausea and  vomiting for 1 week, abnormal ultrasound question acute cholecystitis EXAM: NUCLEAR MEDICINE HEPATOBILIARY IMAGING TECHNIQUE: Sequential images of the abdomen were obtained out to 60 minutes following intravenous administration of radiopharmaceutical. Morphine was administered after 1 hour due to nonvisualization of the gallbladder on initial images. RADIOPHARMACEUTICALS:  5.5 mCi Tc-77m  Choletec IV COMPARISON:  None Correlation: CT abdomen and pelvis 02/07/2018, ultrasound abdomen 02/07/2018 FINDINGS: Normal tracer extraction from bloodstream indicating normal hepatocellular function. Normal excretion of tracer into biliary tree. Gallbladder did not visualized during the first hour of imaging. Small bowel visualized at 16 minutes min. A large photopenic defect is seen within the central liver corresponding to a large hepatic metastasis identified by CT. Additional less well-defined areas of photopenia are seen within the remainder of the liver, corresponding to additional metastases. At 1 hour patient received 3.6 mg of morphine sulfate and imaging was continued for 30 minutes. Gallbladder failed to visualize following morphine augmentation. Findings are consistent with acute cholecystitis. IMPRESSION: Hepatic metastases. Patent CBD.  Nonvisualization of the gallbladder despite morphine administration consistent with acute cholecystitis. Electronically Signed   By: Lavonia Dana M.D.   On: 02/12/2018 12:08    Scheduled Meds: . chlorhexidine  15 mL Mouth/Throat BID  . heparin injection (subcutaneous)  5,000 Units Subcutaneous Q8H  . hydrocortisone sod succinate (SOLU-CORTEF) inj  50 mg Intravenous Q8H  . levothyroxine  12.5 mcg Intravenous Daily   Continuous Infusions: . 0.9 % NaCl with KCl 20 mEq / L    . piperacillin-tazobactam (ZOSYN)  IV 3.375 g (02/12/18 1211)     LOS: 1 day    Time spent: 30 minutes    Barton Dubois, MD [facility] Pager [phone]  If 7PM-7AM, please contact  night-coverage [email or web address] Password [email or web address] 02/12/2018, 1:17 PM

## 2018-02-13 ENCOUNTER — Inpatient Hospital Stay (HOSPITAL_COMMUNITY): Payer: Medicare Other

## 2018-02-13 DIAGNOSIS — R5381 Other malaise: Secondary | ICD-10-CM

## 2018-02-13 LAB — BASIC METABOLIC PANEL
Anion gap: 7 (ref 5–15)
BUN: 9 mg/dL (ref 8–23)
CO2: 22 mmol/L (ref 22–32)
Calcium: 6.5 mg/dL — ABNORMAL LOW (ref 8.9–10.3)
Chloride: 108 mmol/L (ref 98–111)
Creatinine, Ser: 0.59 mg/dL — ABNORMAL LOW (ref 0.61–1.24)
GFR calc Af Amer: >60 mL/min (ref >60)
Glucose, Bld: 141 mg/dL — ABNORMAL HIGH (ref 70–99)
POTASSIUM: 3.2 mmol/L — AB (ref 3.5–5.1)
Sodium: 137 mmol/L (ref 135–145)

## 2018-02-13 LAB — GLUCOSE, CAPILLARY
GLUCOSE-CAPILLARY: 117 mg/dL — AB (ref 70–99)
Glucose-Capillary: 128 mg/dL — ABNORMAL HIGH (ref 70–99)
Glucose-Capillary: 156 mg/dL — ABNORMAL HIGH (ref 70–99)

## 2018-02-13 LAB — CBC
HCT: 26.6 % — ABNORMAL LOW (ref 39.0–52.0)
Hemoglobin: 8.1 g/dL — ABNORMAL LOW (ref 13.0–17.0)
MCH: 28.1 pg (ref 26.0–34.0)
MCHC: 30.5 g/dL (ref 30.0–36.0)
MCV: 92.4 fL (ref 80.0–100.0)
Platelets: 216 10*3/uL (ref 150–400)
RBC: 2.88 MIL/uL — ABNORMAL LOW (ref 4.22–5.81)
RDW: 19.2 % — ABNORMAL HIGH (ref 11.5–15.5)
WBC: 6.6 10*3/uL (ref 4.0–10.5)
nRBC: 0.8 % — ABNORMAL HIGH (ref 0.0–0.2)

## 2018-02-13 MED ORDER — POTASSIUM CHLORIDE CRYS ER 20 MEQ PO TBCR
40.0000 meq | EXTENDED_RELEASE_TABLET | ORAL | Status: AC
  Administered 2018-02-13 (×2): 40 meq via ORAL
  Filled 2018-02-13 (×2): qty 2

## 2018-02-13 MED ORDER — DEXTROSE IN LACTATED RINGERS 5 % IV SOLN: INTRAVENOUS | Status: DC

## 2018-02-13 MED ORDER — LACTATED RINGERS IV SOLN
INTRAVENOUS | Status: DC
  Administered 2018-02-13 – 2018-02-16 (×5): via INTRAVENOUS

## 2018-02-13 NOTE — Progress Notes (Signed)
[facility] Progress Note     Subjective: Says he had a rough night, reports "getting sick."  He says he vomited some but not much.  He says pain is about the same in the epigastric area.  Took in some clears this AM.   Objective: Vital signs in last 24 hours: Temp:  [97.9 F (36.6 C)-98 F (36.7 C)] 97.9 F (36.6 C) (12/14 0556) Pulse Rate:  [79-84] 81 (12/14 0556) Resp:  [17-20] 17 (12/14 0556) BP: (143-153)/(79-86) 153/86 (12/14 0556) SpO2:  [98 %-99 %] 99 % (12/14 0556) Last BM Date: 02/11/18  Intake/Output from previous day: 12/13 0701 - 12/14 0700 In: 1472.3 [P.O.:240; I.V.:1040; IV Piggyback:192.3] Out: -  Intake/Output this shift: No intake/output data recorded.  General appearance: alert, cooperative and no distress Resp: normal work breathing GI: soft, nondistended, RUQ minimal tender, epigastric more tender, no rebound or guarding  Lab Results:  Recent Labs    02/12/18 0644 02/13/18 0640  WBC 7.3 6.6  HGB 9.2* 8.1*  HCT 29.4* 26.6*  PLT 218 216   BMET Recent Labs    02/12/18 0441 02/13/18 0640  NA 137 137  K 3.3* 3.2*  CL 107 108  CO2 20* 22  GLUCOSE 159* 141*  BUN 9 9  CREATININE 0.49* 0.59*  CALCIUM 6.6* 6.5*    Anti-infectives: Anti-infectives (From admission, onward)   Start     Dose/Rate Route Frequency Ordered Stop   02/11/18 1745  piperacillin-tazobactam (ZOSYN) IVPB 3.375 g     3.375 g 12.5 mL/hr over 240 Minutes Intravenous Every 8 hours 02/11/18 1731        Assessment/Plan: Micheal Davis is a 72 yo with acute cholecystitis in the setting of a protracted course and metastatic prostate cancer to the liver. He is not an operative candidate.  -IV antibiotics for now  -IR drain with cholecystostomy tube, unsure when this will be placed, had been on Xarelto, Dr. Cathlean Sauer will find out plans  -Clear liquids, and can have diet as tolerated  -Will continue to follow    LOS: 2 days    Micheal Davis 02/13/2018

## 2018-02-13 NOTE — Progress Notes (Signed)
[facility]  Spoke with Dr. Francena Hanly, Dr. Cathlean Sauer and patient. Plan for cholecystostomy tube tomorrow. Patient likely to keep this permanently. Patient with h/o PE and needs [place] of b/l extremities to r/o possible DVT pending need for IVC filter as anticoagulation is not going to be possible with the chole tube.  Patient understands. [place] coming to do the [place] today.   [name], MD [facility] [address] [place], [place] [address] [phone] (office)

## 2018-02-13 NOTE — Progress Notes (Signed)
PROGRESS NOTE    Micheal Davis  WER:[id number] DOB: September 05, 1945 DOA: 02/11/2018 PCP: [name], MD    Brief Narrative:  72 year old male who presented with abdominal pain, intractable nausea and vomiting.  He does have a past medical history for type 2 diabetes mellitus, hypertension, hypothyroidism and prostate cancer with metastasis.  He had a recent hospitalization for acute cholecystitis, medically treated.  At home he had recurrent severe nausea, vomiting and abdominal pain.  On his initial physical examination his blood pressure was 134/68, he was afebrile, he was in distress, his lungs are clear to auscultation bilaterally, heart S1-S2 present and rhythmic, the abdomen had positive tenderness to palpation in the right upper quadrant and epigastrium, no rebound or guarding, no lower extremity edema.   Patient was admitted to the hospital with recurrent cholecystitis.   Assessment & Plan:   Principal Problem:   Intractable nausea and vomiting Active Problems:   Prostate cancer metastatic to bone (HCC)   Hypertension   Thyroid disease   Diabetes mellitus without complication (HCC)   History of pulmonary embolus (PE)   Current chronic use of systemic steroids   Acute cholecystitis   Hypokalemia   Chronic anemia   Physical deconditioning   1. Acute cholecystitis. Patient is not a good surgical candidate, will continue antibiotic therapy with IV Zosyn, continue pain control with IV hydromorphone and will add IV fluids for hydration. Continue as needed antiemetics. Case dicussed with Dr. Pascal Lux from IR and will place patient NPO past midnight, will hold on heparin sq. Per my conversation with Dr. Pascal Lux high complexity of procedure due to metastatic liver disease. Case discussed with Dr. Constance Haw from surgery. Patient on chronic prednisone, will continue on stress dose steroids, hydrocortisone 50 mg IV q 8 hours.   2. Hx of PE. Will hold on heparin due possible procedure in am,  rivaroxaban not received any dosage since admission 02/11/2018. Continue oxymetry monitoring and supplemental 02 per [place].   3. Hypokalemia. Will start patient on IV balanced electrolyte solution with D5LR at 75 ml per hour and will correct K with oral Kcl 80 meq today in 2 divided doses. Follow on renal panel in am.   4. Prostate cancer with liver mets. Poor prognosis, will continue pain control and follow as outpatient.   5. HTN. Continue to hold atenolol and diltiazem to prevent hypotension, blood pressure systolic 195 to 093 mmHg.   6. T2DM. Fasting glucose 141, with capillary glucose 160, 128 and 117. Continue insulin sliding scale and basal insulin with 5 units of levimir.  7. Hypothyroid. Continue levothyroxine.   DVT prophylaxis: scd   Code Status:   dnr  Family Communication: I spoke with patient's family at the bedside and all questions were addressed.  Disposition Plan/ discharge barriers: pending IR cholecystostomy tube.   Body mass index is 29.32 kg/m. Malnutrition Type:      Malnutrition Characteristics:      Nutrition Interventions:     RN Pressure Injury Documentation:     Consultants:   Surgery   IR  Procedures:   \  Antimicrobials:   IV zosyn     Subjective: Patient continue to have abdominal pain, mid epigastrium moderate in intensity, dull in nature, no radiation, improved with analgesics.   Objective: Vitals:   02/12/18 1517 02/12/18 2100 02/12/18 2135 02/13/18 0556  BP: (!) 149/79 (!) 143/84  (!) 153/86  Pulse: 84 79  81  Resp: 20 19  17   Temp: 98 F (36.7  C) 98 F (36.7 C)  97.9 F (36.6 C)  TempSrc:  Oral  Oral  SpO2: 98% 99% 99% 99%  Weight:      Height:        Intake/Output Summary (Last 24 hours) at 02/13/2018 1208 Last data filed at 02/13/2018 0500 Gross per 24 hour  Intake 1472.27 ml  Output -  Net 1472.27 ml   Filed Weights   02/11/18 1408 02/11/18 1850  Weight: 90.7 kg 92.7 kg    Examination:   General:  deconditioned  Neurology: Awake and alert, non focal  E ENT: mild pallor, no icterus, oral mucosa moist Cardiovascular: No JVD. S1-S2 present, rhythmic, no gallops, rubs, or murmurs. No lower extremity edema. Pulmonary: decreased breath sounds bilaterally, adequate air movement, no wheezing, rhonchi or rales. Gastrointestinal. Abdomen distended, no organomegaly, tender to palpation in the epigastrium, no rebound or guarding Skin. No rashes Musculoskeletal: no joint deformities     Data Reviewed: I have personally reviewed following labs and imaging studies  CBC: Recent Labs  Lab 02/07/18 0151  02/09/18 0000 02/09/18 0539 02/11/18 1410 02/12/18 0644 02/13/18 0640  WBC 9.3   < > 5.2 5.4 8.0 7.3 6.6  NEUTROABS 7.9*  --   --   --  7.1  --   --   HGB 9.2*   < > 7.8* 8.6* 9.3* 9.2* 8.1*  HCT 30.3*   < > 25.9* 28.8* 30.9* 29.4* 26.6*  MCV 94.7   < > 95.2 95.7 92.2 91.6 92.4  PLT 233   < > 173 201 203 218 216   < > = values in this interval not displayed.   Basic Metabolic Panel: Recent Labs  Lab 02/09/18 0539 02/10/18 0000 02/11/18 1410 02/12/18 0441 02/13/18 0640  NA 137 134* 135 137 137  K 3.3* 3.7 3.3* 3.3* 3.2*  CL 103 102 103 107 108  CO2 24 23 22  20* 22  GLUCOSE 147* 176* 218* 159* 141*  BUN 12 10 11 9 9   CREATININE 0.68 0.58* 0.56* 0.49* 0.59*  CALCIUM 6.2* 6.5* 6.9* 6.6* 6.5*  MG  --   --  2.0  --   --    GFR: Estimated Creatinine Clearance: 94.1 mL/min (A) (by C-G formula based on SCr of 0.59 mg/dL (L)). Liver Function Tests: Recent Labs  Lab 02/08/18 0640 02/09/18 0539 02/10/18 0000 02/11/18 1410 02/12/18 0441  AST 150* 100* 61* 46* 36  ALT 36 29 27 31 28   ALKPHOS 277* 277* 250* 259* 222*  BILITOT 0.9 1.0 0.6 1.3* 1.2  PROT 5.7* 5.7* 5.2* 6.1* 5.5*  ALBUMIN 2.7* 2.6* 2.2* 2.5* 2.2*   Recent Labs  Lab 02/07/18 0151 02/11/18 1410  LIPASE 24 21   No results for input(s): AMMONIA in the last 168 hours. Coagulation Profile: No results for  input(s): INR, PROTIME in the last 168 hours. Cardiac Enzymes: No results for input(s): CKTOTAL, CKMB, CKMBINDEX, TROPONINI in the last 168 hours. BNP (last 3 results) No results for input(s): PROBNP in the last 8760 hours. HbA1C: No results for input(s): HGBA1C in the last 72 hours. CBG: Recent Labs  Lab 02/11/18 2151 02/12/18 1625 02/12/18 2057 02/13/18 0748 02/13/18 1154  GLUCAP 166* 232* 160* 128* 117*   Lipid Profile: No results for input(s): CHOL, HDL, LDLCALC, TRIG, CHOLHDL, LDLDIRECT in the last 72 hours. Thyroid Function Tests: No results for input(s): TSH, T4TOTAL, FREET4, T3FREE, THYROIDAB in the last 72 hours. Anemia Panel: No results for input(s): VITAMINB12, FOLATE, FERRITIN, TIBC, IRON, RETICCTPCT  in the last 72 hours.    Radiology Studies: I have reviewed all of the imaging during this hospital visit personally     Scheduled Meds: . chlorhexidine  15 mL Mouth/Throat BID  . heparin injection (subcutaneous)  5,000 Units Subcutaneous Q8H  . hydrocortisone sod succinate (SOLU-CORTEF) inj  50 mg Intravenous Q8H  . insulin aspart  0-9 Units Subcutaneous TID WC  . insulin detemir  5 Units Subcutaneous QHS  . levothyroxine  12.5 mcg Intravenous Daily   Continuous Infusions: . piperacillin-tazobactam (ZOSYN)  IV 3.375 g (02/13/18 2897)     LOS: 2 days        Mauricio Gerome Apley, MD [facility] Pager [phone]

## 2018-02-14 ENCOUNTER — Encounter (HOSPITAL_COMMUNITY): Payer: Self-pay | Admitting: Interventional Radiology

## 2018-02-14 ENCOUNTER — Ambulatory Visit (HOSPITAL_COMMUNITY)
Admit: 2018-02-14 | Discharge: 2018-02-14 | Disposition: A | Discharge status: Home / Self Care | Payer: Medicare Other | Source: Ambulatory Visit | Attending: General Surgery | Admitting: General Surgery

## 2018-02-14 DIAGNOSIS — K810 Acute cholecystitis: Secondary | ICD-10-CM | POA: Insufficient documentation

## 2018-02-14 DIAGNOSIS — I824Y9 Acute embolism and thrombosis of unspecified deep veins of unspecified proximal lower extremity: Secondary | ICD-10-CM

## 2018-02-14 DIAGNOSIS — E876 Hypokalemia: Secondary | ICD-10-CM

## 2018-02-14 DIAGNOSIS — C7951 Secondary malignant neoplasm of bone: Secondary | ICD-10-CM

## 2018-02-14 HISTORY — PX: IR PERC CHOLECYSTOSTOMY: IMG2326

## 2018-02-14 LAB — CBC WITH DIFFERENTIAL/PLATELET
Abs Immature Granulocytes: 0.08 10*3/uL — ABNORMAL HIGH (ref 0.00–0.07)
Basophils Absolute: 0 10*3/uL (ref 0.0–0.1)
Basophils Relative: 0 %
Eosinophils Absolute: 0 10*3/uL (ref 0.0–0.5)
Eosinophils Relative: 0 %
HEMATOCRIT: 27 % — AB (ref 39.0–52.0)
Hemoglobin: 7.8 g/dL — ABNORMAL LOW (ref 13.0–17.0)
Immature Granulocytes: 2 %
LYMPHS ABS: 0.5 10*3/uL — AB (ref 0.7–4.0)
Lymphocytes Relative: 12 %
MCH: 27 pg (ref 26.0–34.0)
MCHC: 28.9 g/dL — ABNORMAL LOW (ref 30.0–36.0)
MCV: 93.4 fL (ref 80.0–100.0)
Monocytes Absolute: 0.3 10*3/uL (ref 0.1–1.0)
Monocytes Relative: 7 %
Neutro Abs: 3.3 10*3/uL (ref 1.7–7.7)
Neutrophils Relative %: 79 %
Platelets: 210 10*3/uL (ref 150–400)
RBC: 2.89 MIL/uL — ABNORMAL LOW (ref 4.22–5.81)
RDW: 19.3 % — ABNORMAL HIGH (ref 11.5–15.5)
WBC: 4.2 10*3/uL (ref 4.0–10.5)
nRBC: 1.4 % — ABNORMAL HIGH (ref 0.0–0.2)

## 2018-02-14 LAB — BASIC METABOLIC PANEL
Anion gap: 7 (ref 5–15)
BUN: 7 mg/dL — ABNORMAL LOW (ref 8–23)
CHLORIDE: 108 mmol/L (ref 98–111)
CO2: 24 mmol/L (ref 22–32)
CREATININE: 0.48 mg/dL — AB (ref 0.61–1.24)
Calcium: 6.6 mg/dL — ABNORMAL LOW (ref 8.9–10.3)
GFR calc Af Amer: >60 mL/min (ref >60)
GFR calc non Af Amer: >60 mL/min (ref >60)
Glucose, Bld: 107 mg/dL — ABNORMAL HIGH (ref 70–99)
POTASSIUM: 3.4 mmol/L — AB (ref 3.5–5.1)
Sodium: 139 mmol/L (ref 135–145)

## 2018-02-14 LAB — GLUCOSE, CAPILLARY: Glucose-Capillary: 102 mg/dL — ABNORMAL HIGH (ref 70–99)

## 2018-02-14 LAB — PROTIME-INR
INR: 1.14
Prothrombin Time: 14.5 seconds (ref 11.4–15.2)

## 2018-02-14 MED ORDER — MIDAZOLAM HCL 2 MG/2ML IJ SOLN
INTRAMUSCULAR | Status: AC
  Filled 2018-02-14: qty 6

## 2018-02-14 MED ORDER — LIDOCAINE HCL (PF) 1 % IJ SOLN
INTRAMUSCULAR | Status: AC | PRN
  Administered 2018-02-14: 5 mL

## 2018-02-14 MED ORDER — FENTANYL CITRATE (PF) 100 MCG/2ML IJ SOLN
INTRAMUSCULAR | Status: AC
  Filled 2018-02-14: qty 4

## 2018-02-14 MED ORDER — IOPAMIDOL (ISOVUE-300) INJECTION 61%
INTRAVENOUS | Status: AC
  Administered 2018-02-14: 15 mL
  Filled 2018-02-14: qty 50

## 2018-02-14 MED ORDER — ONDANSETRON HCL 4 MG/2ML IJ SOLN
INTRAMUSCULAR | Status: AC | PRN
  Administered 2018-02-14: 4 mg via INTRAVENOUS

## 2018-02-14 MED ORDER — FENTANYL CITRATE (PF) 100 MCG/2ML IJ SOLN
INTRAMUSCULAR | Status: AC | PRN
  Administered 2018-02-14: 50 ug via INTRAVENOUS
  Administered 2018-02-14: 25 ug via INTRAVENOUS

## 2018-02-14 MED ORDER — SODIUM CHLORIDE 0.9% FLUSH: 5.0000 mL | Freq: Three times a day (TID) | INTRAVENOUS | Status: DC

## 2018-02-14 MED ORDER — MIDAZOLAM HCL 2 MG/2ML IJ SOLN
INTRAMUSCULAR | Status: AC | PRN
  Administered 2018-02-14: 0.5 mg via INTRAVENOUS
  Administered 2018-02-14: 1 mg via INTRAVENOUS
  Administered 2018-02-14: 0.5 mg via INTRAVENOUS

## 2018-02-14 MED ORDER — PHENOL 1.4 % MT LIQD
1.0000 | OROMUCOSAL | Status: DC | PRN
  Administered 2018-02-14: 1 via OROMUCOSAL
  Filled 2018-02-14: qty 177

## 2018-02-14 MED ORDER — ONDANSETRON HCL 4 MG/2ML IJ SOLN
INTRAMUSCULAR | Status: AC
  Filled 2018-02-14: qty 2

## 2018-02-14 MED ORDER — LIDOCAINE HCL 1 % IJ SOLN
INTRAMUSCULAR | Status: AC
  Filled 2018-02-14: qty 20

## 2018-02-14 NOTE — Sedation Documentation (Signed)
SBAR called to [employer].

## 2018-02-14 NOTE — Progress Notes (Signed)
Pharmacy Antibiotic Note  Micheal Davis is a 72 y.o. male admitted on 02/11/2018 with intra abdominal infection.  Pharmacy has been consulted for zosyn dosing.  Plan: Continue Zosyn 3.375g IV every 8 hours. Monitor labs, c/s, and patient improvement.   Height: 5\' 10"  (177.8 cm) Weight: 204 lb 5.9 oz (92.7 kg) IBW/kg (Calculated) : 73  Temp (24hrs), Avg:97.6 F (36.4 C), Min:97.3 F (36.3 C), Max:97.8 F (36.6 C)  Recent Labs  Lab 02/09/18 0539 02/10/18 0000 02/11/18 1410 02/12/18 0441 02/12/18 0644 02/13/18 0640 02/14/18 0730  WBC 5.4  --  8.0  --  7.3 6.6 4.2  CREATININE 0.68 0.58* 0.56* 0.49*  --  0.59* 0.48*    Estimated Creatinine Clearance: 94.1 mL/min (A) (by C-G formula based on SCr of 0.48 mg/dL (L)).    No Known Allergies  Antimicrobials this admission: 12/12 zosyn >>   Microbiology results: None pending  Thank you for allowing pharmacy to be a part of this patient's care.  Ramond Craver 02/14/2018 11:15 AM

## 2018-02-14 NOTE — Sedation Documentation (Signed)
[facility] to transport back to [facility].

## 2018-02-14 NOTE — Progress Notes (Signed)
[facility] Progress Note     Subjective: Just back from IR. Cholecystostomy tube placed. Doing ok. Port was deaccessed accidentally. Had some nausea/vomiting post procedure.   Objective: Vital signs in last 24 hours: Temp:  [97.3 F (36.3 C)-97.8 F (36.6 C)] 97.7 F (36.5 C) (12/15 0642) Pulse Rate:  [64-73] 68 (12/15 1145) Resp:  [11-19] 11 (12/15 1145) BP: (132-161)/(73-96) 151/82 (12/15 1145) SpO2:  [95 %-100 %] 98 % (12/15 1145) Last BM Date: 02/13/18  Intake/Output from previous day: 12/14 0701 - 12/15 0700 In: 1314.4 [I.V.:1162.7; IV Piggyback:151.7] Out: 400 [Urine:400] Intake/Output this shift: No intake/output data recorded.  General appearance: alert, cooperative and no distress Resp: normal work breathing GI: soft, chole tube with greenish dark bile, nondistended, tender RUQ  Lab Results:  Recent Labs    02/13/18 0640 02/14/18 0730  WBC 6.6 4.2  HGB 8.1* 7.8*  HCT 26.6* 27.0*  PLT 216 210   BMET Recent Labs    02/13/18 0640 02/14/18 0730  NA 137 139  K 3.2* 3.4*  CL 108 108  CO2 22 24  GLUCOSE 141* 107*  BUN 9 7*  CREATININE 0.59* 0.48*  CALCIUM 6.5* 6.6*   PT/INR Recent Labs    02/14/18 0843  LABPROT 14.5  INR 1.14    Studies/Results: US Venous Img Lower Bilateral  Result Date: 02/13/2018 CLINICAL DATA:  History of pulmonary embolus. EXAM: BILATERAL LOWER EXTREMITY VENOUS DOPPLER ULTRASOUND TECHNIQUE: Gray-scale sonography with graded compression, as well as color Doppler and duplex ultrasound were performed to evaluate the lower extremity deep venous systems from the level of the common femoral vein and including the common femoral, femoral, profunda femoral, popliteal and calf veins including the posterior tibial, peroneal and gastrocnemius veins when visible. The superficial great saphenous vein was also interrogated. Spectral Doppler was utilized to evaluate flow at rest and with distal augmentation maneuvers in the  common femoral, femoral and popliteal veins. COMPARISON:  None. FINDINGS: RIGHT LOWER EXTREMITY Common Femoral Vein: No evidence of thrombus. Normal compressibility, respiratory phasicity and response to augmentation. Saphenofemoral Junction: No evidence of thrombus. Normal compressibility and flow on color Doppler imaging. Profunda Femoral Vein: No evidence of thrombus. Normal compressibility and flow on color Doppler imaging. Femoral Vein: No evidence of thrombus. Normal compressibility, respiratory phasicity and response to augmentation. Popliteal Vein: No evidence of thrombus. Normal compressibility, respiratory phasicity and response to augmentation. Calf Veins: No evidence of thrombus. Normal compressibility and flow on color Doppler imaging. Superficial Great Saphenous Vein: No evidence of thrombus. Normal compressibility. Venous Reflux:  None. Other Findings:  None. LEFT LOWER EXTREMITY Common Femoral Vein: No evidence of thrombus. Normal compressibility, respiratory phasicity and response to augmentation. Saphenofemoral Junction: No evidence of thrombus. Normal compressibility and flow on color Doppler imaging. Profunda Femoral Vein: No evidence of thrombus. Normal compressibility and flow on color Doppler imaging. Femoral Vein: No evidence of thrombus. Normal compressibility, respiratory phasicity and response to augmentation. Popliteal Vein: No evidence of thrombus. Normal compressibility, respiratory phasicity and response to augmentation. Calf Veins: No evidence of thrombus. Normal compressibility and flow on color Doppler imaging. Superficial Great Saphenous Vein: No evidence of thrombus. Normal compressibility. Venous Reflux:  None. Other Findings:  None. IMPRESSION: No evidence of deep venous thrombosis. Electronically Signed   By: Rolm Baptise M.D.   On: 02/13/2018 16:55    Anti-infectives: Anti-infectives (From admission, onward)   Start     Dose/Rate Route Frequency Ordered Stop   02/11/18  1745  piperacillin-tazobactam (ZOSYN) IVPB  3.375 g     3.375 g 12.5 mL/hr over 240 Minutes Intravenous Every 8 hours 02/11/18 1731        Assessment/Plan: Mr. Issa is a 30y with acute cholecystitis in the setting of metastatic prostate cancer to the liver. No amenable to surgery. IR placed cholecystostomy tube.  PRN for pain Diet as tolerated Drain care per IR   LOS: 3 days    Virl Cagey 02/14/2018

## 2018-02-14 NOTE — Progress Notes (Signed)
PROGRESS NOTE    Micheal Davis  ZOX:[id number] DOB: 1945/05/12 DOA: 02/11/2018 PCP: [name], MD    Brief Narrative:  72 y.o. 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  History provided by patient and wife at bedside.  He was discharged from the hospital yesterday afternoon and was feeling okay.  He was able to tolerate soft foods until yesterday.  He woke up early this morning and started vomiting again.  He has been having severe epigastric/right upper quadrant abdominal pain.  He has had 6 episodes of vomiting today.  Denies having any fevers but feels chills.  Patient was recently admitted from February 07, 2018 to February 10, 2018 for intractable nausea and vomiting secondary to acute cholecystitis.  Right upper quadrant ultrasound done at that time with findings consistent with acute cholecystitis; no evidence of biliary ductal dilatation.   Patient was discharge but symptoms became unbearable and has been readmitted. General surgery and IR consulted. Will need cholecystostomy.   Assessment & Plan: 1-Intractable nausea and vomiting: in the setting of acute cholecystitis  -positive Hida scan -Status post cholecystostomy tube placement by IR; will follow further recommendations for drain care. -will continue IV antibiotics -Continue PRN antiemetics, analgesics and allow CLD -prognosis is guarded  2-Prostate cancer metastatic to bone ([place]) -prognosis is poor overall. -continue outpatient follow up with oncology   3-hypokalemia -in the setting of poor oral intake and GI loses -continue repletion as needed -Follow electrolytes trend.  4-hx of PE -Continue holding anticoagulation.  5-Thyroid disease -continue synthroid   6-Diabetes mellitus without  complication (HCC)  7-Current chronic use of systemic steroids -continue solucortef   DVT prophylaxis: heparin Code Status: DNR Family Communication: wife at bedside  Disposition Plan: remains inpatient. Will continue antiemetics and analgesics, allow clear liquid diet. Continue IV antibiotics. Follow general surgery and IR rec's and stop anticoagulation.   Consultants:   General surgery  IR  Procedures:   Hida Scan: positive for cholecystitis   Antimicrobials:  Anti-infectives (From admission, onward)   Start     Dose/Rate Route Frequency Ordered Stop   02/11/18 1745  piperacillin-tazobactam (ZOSYN) IVPB 3.375 g     3.375 g 12.5 mL/hr over 240 Minutes Intravenous Every 8 hours 02/11/18 1731        Subjective: Afebrile.  Continue having abdominal pain; patient expressed intermittent nausea.  Currently without vomiting.  No chest pain, no shortness of breath.  Objective: Vitals:   02/13/18 1509 02/13/18 2059 02/13/18 2107 02/14/18 0642  BP: 132/80  (!) 147/88 (!) 161/96  Pulse: 72  69 64  Resp: 18  18   Temp: (!) 97.3 F (36.3 C)  97.8 F (36.6 C) 97.7 F (36.5 C)  TempSrc:   Oral Oral  SpO2: 95% 96% 98% 99%  Weight:      Height:        Intake/Output Summary (Last 24 hours) at 02/14/2018 1406 Last data filed at 02/14/2018 0557 Gross per 24 hour  Intake 1314.36 ml  Output 400 ml  Net 914.36 ml   Filed Weights   02/11/18 1408 02/11/18 1850  Weight: 90.7 kg 92.7 kg    Examination: General exam: Alert, awake, oriented x 3; patient reports having some mid abdominal pain.  Currently no having any vomiting.  Has tolerated well cholecystostomy tube placement. Respiratory system:  Clear to auscultation. Respiratory effort normal. Cardiovascular system:RRR. No murmurs, rubs, gallops. Gastrointestinal system: Abdomen is tender to palpation to mid abdomen, no guarding, positive bowel sounds.   Central nervous system: Alert and oriented. No focal neurological  deficits. Extremities: No cyanosis or clubbing; positive trace to 1+ edema bilaterally. Skin: No rashes, no petechiae.  Cholecystostomy tube in place. Psychiatry: Judgement and insight appear normal. Mood & affect appropriate.   Data Reviewed: I have personally reviewed following labs and imaging studies  CBC: Recent Labs  Lab 02/09/18 0539 02/11/18 1410 02/12/18 0644 02/13/18 0640 02/14/18 0730  WBC 5.4 8.0 7.3 6.6 4.2  NEUTROABS  --  7.1  --   --  3.3  HGB 8.6* 9.3* 9.2* 8.1* 7.8*  HCT 28.8* 30.9* 29.4* 26.6* 27.0*  MCV 95.7 92.2 91.6 92.4 93.4  PLT 201 203 218 216 672   Basic Metabolic Panel: Recent Labs  Lab 02/10/18 0000 02/11/18 1410 02/12/18 0441 02/13/18 0640 02/14/18 0730  NA 134* 135 137 137 139  K 3.7 3.3* 3.3* 3.2* 3.4*  CL 102 103 107 108 108  CO2 23 22 20* 22 24  GLUCOSE 176* 218* 159* 141* 107*  BUN 10 11 9 9  7*  CREATININE 0.58* 0.56* 0.49* 0.59* 0.48*  CALCIUM 6.5* 6.9* 6.6* 6.5* 6.6*  MG  --  2.0  --   --   --    GFR: Estimated Creatinine Clearance: 94.1 mL/min (A) (by C-G formula based on SCr of 0.48 mg/dL (L)).   Liver Function Tests: Recent Labs  Lab 02/08/18 0640 02/09/18 0539 02/10/18 0000 02/11/18 1410 02/12/18 0441  AST 150* 100* 61* 46* 36  ALT 36 29 27 31 28   ALKPHOS 277* 277* 250* 259* 222*  BILITOT 0.9 1.0 0.6 1.3* 1.2  PROT 5.7* 5.7* 5.2* 6.1* 5.5*  ALBUMIN 2.7* 2.6* 2.2* 2.5* 2.2*   Recent Labs  Lab 02/11/18 1410  LIPASE 21   CBG: Recent Labs  Lab 02/12/18 2057 02/13/18 0748 02/13/18 1154 02/13/18 1703 02/14/18 0750  GLUCAP 160* 128* 117* 156* 102*   Urine analysis:    Component Value Date/Time   COLORURINE YELLOW 02/07/2018 0308   APPEARANCEUR CLEAR 02/07/2018 0308   LABSPEC 1.018 02/07/2018 0308   PHURINE 7.0 02/07/2018 0308   GLUCOSEU NEGATIVE 02/07/2018 0308   HGBUR NEGATIVE 02/07/2018 0308   BILIRUBINUR NEGATIVE 02/07/2018 0308   KETONESUR NEGATIVE 02/07/2018 0308   PROTEINUR NEGATIVE 02/07/2018  0308   UROBILINOGEN 1.0 09/27/2010 1055   NITRITE NEGATIVE 02/07/2018 0308   LEUKOCYTESUR NEGATIVE 02/07/2018 0308   Radiology Studies: US Venous Img Lower Bilateral  Result Date: 02/13/2018 CLINICAL DATA:  History of pulmonary embolus. EXAM: BILATERAL LOWER EXTREMITY VENOUS DOPPLER ULTRASOUND TECHNIQUE: Gray-scale sonography with graded compression, as well as color Doppler and duplex ultrasound were performed to evaluate the lower extremity deep venous systems from the level of the common femoral vein and including the common femoral, femoral, profunda femoral, popliteal and calf veins including the posterior tibial, peroneal and gastrocnemius veins when visible. The superficial great saphenous vein was also interrogated. Spectral Doppler was utilized to evaluate flow at rest and with distal augmentation maneuvers in the common femoral, femoral and popliteal veins. COMPARISON:  None. FINDINGS: RIGHT LOWER EXTREMITY Common Femoral Vein: No evidence of thrombus. Normal compressibility, respiratory phasicity and response to augmentation. Saphenofemoral Junction: No evidence of thrombus. Normal compressibility and flow on color Doppler imaging. Profunda Femoral Vein: No evidence of thrombus. Normal compressibility and flow on color Doppler imaging. Femoral Vein:  No evidence of thrombus. Normal compressibility, respiratory phasicity and response to augmentation. Popliteal Vein: No evidence of thrombus. Normal compressibility, respiratory phasicity and response to augmentation. Calf Veins: No evidence of thrombus. Normal compressibility and flow on color Doppler imaging. Superficial Great Saphenous Vein: No evidence of thrombus. Normal compressibility. Venous Reflux:  None. Other Findings:  None. LEFT LOWER EXTREMITY Common Femoral Vein: No evidence of thrombus. Normal compressibility, respiratory phasicity and response to augmentation. Saphenofemoral Junction: No evidence of thrombus. Normal compressibility  and flow on color Doppler imaging. Profunda Femoral Vein: No evidence of thrombus. Normal compressibility and flow on color Doppler imaging. Femoral Vein: No evidence of thrombus. Normal compressibility, respiratory phasicity and response to augmentation. Popliteal Vein: No evidence of thrombus. Normal compressibility, respiratory phasicity and response to augmentation. Calf Veins: No evidence of thrombus. Normal compressibility and flow on color Doppler imaging. Superficial Great Saphenous Vein: No evidence of thrombus. Normal compressibility. Venous Reflux:  None. Other Findings:  None. IMPRESSION: No evidence of deep venous thrombosis. Electronically Signed   By: Rolm Baptise M.D.   On: 02/13/2018 16:55   Ir Perc Cholecystostomy  Result Date: 02/14/2018 INDICATION: History of metastatic prostate cancer now with acute cholecystitis. Unfortunately, patient has failed trial of conservative management with antibiotics. The patient has been deemed a poor operative candidate and as such, request made for placement of a image guided cholecystostomy tube for symptomatic/palliative purposes. EXAM: ULTRASOUND AND FLUOROSCOPIC-GUIDED CHOLECYSTOSTOMY TUBE PLACEMENT COMPARISON:  CT abdomen pelvis-02/07/2018; right upper quadrant abdominal ultrasound-02/07/2018; nuclear medicine HIDA scan-12/13/ MEDICATIONS: The patient is currently admitted to the hospital and on intravenous antibiotics. Antibiotics were administered within an appropriate time frame prior to skin puncture. ANESTHESIA/SEDATION: Moderate (conscious) sedation was employed during this procedure. A total of Versed 2 mg and Fentanyl 100 mcg was administered intravenously. Moderate Sedation Time: 17 minutes. The patient's level of consciousness and vital signs were monitored continuously by radiology nursing throughout the procedure under my direct supervision. CONTRAST:  15 cc Isovue-300-administered into the gallbladder fossa. FLUOROSCOPY TIME:  36 seconds (9  mGy) COMPLICATIONS: None immediate. PROCEDURE: Informed written consent was obtained from the patient after a discussion of the risks, benefits and alternatives to treatment. Questions regarding the procedure were encouraged and answered. A timeout was performed prior to the initiation of the procedure. The right upper abdominal quadrant was prepped and draped in the usual sterile fashion, and a sterile drape was applied covering the operative field. Maximum barrier sterile technique with sterile gowns and gloves were used for the procedure. A timeout was performed prior to the initiation of the procedure. Local anesthesia was provided with 1% lidocaine with epinephrine. Ultrasound scanning of the right upper quadrant demonstrates a moderately dilated gallbladder. Multiple mixed echogenic lesions were seen throughout the liver compatible with known advanced hepatic metastatic disease Utilizing a transhepatic approach with special attention made to avoid any of the hepatic metastases, a 22 gauge needle was advanced into the gallbladder under direct ultrasound guidance. An ultrasound image was saved for documentation purposes. Appropriate intraluminal puncture was confirmed with the efflux of bile and advancement of an 0.018 wire into the gallbladder lumen. The needle was exchanged for an Dawson set. A small amount of contrast was injected to confirm appropriate intraluminal positioning. Over a Benson wire, a 5.2-French Cook cholecystomy tube was advanced into the gallbladder fossa, coiled and locked. Bile was aspirated and a small amount of contrast was injected as several post procedural spot radiographic images were obtained in various obliquities. The catheter was  secured to the skin with suture, connected to a drainage bag and a dressing was placed. The patient tolerated the procedure well without immediate post procedural complication. IMPRESSION: Successful ultrasound and fluoroscopic guided placement of a  10.2 French cholecystostomy tube. Electronically Signed   By: Sandi Mariscal M.D.   On: 02/14/2018 13:56    Scheduled Meds: . chlorhexidine  15 mL Mouth/Throat BID  . hydrocortisone sod succinate (SOLU-CORTEF) inj  50 mg Intravenous Q8H  . insulin aspart  0-9 Units Subcutaneous TID WC  . insulin detemir  5 Units Subcutaneous QHS  . levothyroxine  12.5 mcg Intravenous Daily   Continuous Infusions: . lactated ringers 75 mL/hr at 02/14/18 0317  . piperacillin-tazobactam (ZOSYN)  IV 3.375 g (02/14/18 9242)     LOS: 3 days    Time spent: 30 minutes    Barton Dubois, MD [facility] Pager [phone]  If 7PM-7AM, please contact night-coverage [email or web address] Password [email or web address] 02/14/2018, 2:06 PM

## 2018-02-14 NOTE — Sedation Documentation (Signed)
[facility] called for transport back to [facility].

## 2018-02-14 NOTE — Progress Notes (Signed)
Referring Physician(s): Hayes Ludwig  Supervising Physician: Sandi Mariscal  Patient Status:  AP IP  Chief Complaint:  Abdominal pain, nausea, vomiting, cholecystitis  Subjective: Patient familiar to IR service from prior liver lesion biopsy in March of this year.  He was recently admitted to [facility] on 12/12 with recurrent abdominal pain, nausea, vomiting.  He has failed conservative treatment for acute cholecystitis.  He has a past medical history significant for diabetes, hypertension, hypothyroidism, metastatic prostate carcinoma with liver metastases as well as PE.  Recent lower extremity venous Dopplers revealed no evidence of DVT.  He is a poor surgical candidate and request now received for percutaneous cholecystostomy.  Recent imaging has revealed significant interval increase in the number and size of hepatic disease, diffuse thickening of the gallbladder wall, colonic diverticulosis and extensive osseous metastatic disease.  Nuclear medicine hepatobiliary scan revealed patent common bile duct with non-visualization of the gallbladder consistent with acute cholecystitis.  He is currently afebrile, WBC normal, hemoglobin 7.8, platelets 210k, PT/INR normal, creatinine 0.48.  He is on IV Zosyn.  Past Medical History:  Diagnosis Date  . Diabetes mellitus without complication ([place])   . Hypertension   . Prostate cancer ([place]) 09/05/2015  . 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 Sleep apnea   . Thyroid disease    Past Surgical History:  Procedure Laterality Date  . CATARACT EXTRACTION    . PORTACATH PLACEMENT Left 12/31/2015   Procedure: INSERTION PORT-A-CATH LEFT SUBCLAVIAN;  Surgeon: Aviva Signs, MD;  Location: [facility];  Service: General;  Laterality: Left;  . REPLACEMENT TOTAL KNEE Left       Allergies: Patient has no known allergies.  Medications: Prior to Admission medications   Medication Sig Start Date End Date Taking? Authorizing Provider    amoxicillin-clavulanate (AUGMENTIN) 875-125 MG tablet Take 1 tablet by mouth 2 (two) times daily. 02/10/18  Yes Patrecia Pour, MD  atenolol (TENORMIN) 100 MG tablet Take 100 mg by mouth daily.   Yes [provider]  atorvastatin (LIPITOR) 20 MG tablet Take 10 mg by mouth every morning.    Yes [provider]  Calcium Carb-Cholecalciferol (CALCIUM PLUS VITAMIN D3) 600-500 MG-UNIT CAPS Take 1,200 mg by mouth daily.    Yes [provider]  chlorhexidine (PERIDEX) 0.12 % solution Use as directed 15 mLs in the mouth or throat 2 (two) times daily.   Yes [provider]  cyclobenzaprine (FLEXERIL) 10 MG tablet Take 10 mg by mouth 3 (three) times daily as needed for muscle spasms.   Yes [provider]  diltiazem (CARDIZEM CD) 300 MG 24 hr capsule Take 300 mg by mouth daily.   Yes [provider]  gabapentin (NEURONTIN) 300 MG capsule Patient may take 1 capsule in the morning and 2 capsules @ bedtime. Patient taking differently: Take 300 mg by mouth 2 (two) times daily. Patient may take 1 capsule in the morning and 2 capsules @ bedtime. 10/19/17  Yes Lockamy, Randi L, NP-C  Glucosamine-Chondroit-Vit C-Mn (GLUCOSAMINE 1500 COMPLEX PO) Take 1 tablet by mouth 2 (two) times daily.    Yes [provider]  levothyroxine (SYNTHROID, LEVOTHROID) 25 MCG tablet Take 25 mcg by mouth daily before breakfast.   Yes [provider]  morphine (MS CONTIN) 60 MG 12 hr tablet Take 1 tablet (60 mg total) by mouth every 12 (twelve) hours. 01/18/18  Yes Lockamy, Randi L, NP-C  morphine (MSIR) 30 MG tablet Take 1 tablet (30 mg total) by  mouth every 6 (six) hours as needed for severe pain. 01/18/18  Yes Lockamy, Randi L, NP-C  Multiple Vitamin (MULTIVITAMIN WITH MINERALS) TABS tablet Take 1 tablet by mouth daily.   Yes [provider]  Omega-3 Fatty Acids (FISH OIL PO) Take 1 capsule by mouth daily. 03/21/16  Yes [provider]  ondansetron  (ZOFRAN-ODT) 8 MG disintegrating tablet Take 1 tablet (8 mg total) by mouth every 8 (eight) hours as needed for nausea or vomiting. 11/30/17  Yes Lockamy, Randi L, NP-C  potassium chloride SA (KLOR-CON M20) 20 MEQ tablet TAKE 20 MEQ BY MOUTH ONCE DAILY Patient taking differently: Take 20 mEq by mouth daily. TAKE 20 MEQ BY MOUTH ONCE DAILY 11/10/17  Yes Derek Jack, MD  predniSONE (DELTASONE) 5 MG tablet Take 1 tablet (5 mg total) by mouth 2 (two) times daily. Patient taking differently: Take 7.5 mg by mouth daily.  12/24/17  Yes Derek Jack, MD  rivaroxaban (XARELTO) 10 MG TABS tablet Take 1 tablet (10 mg total) by mouth daily. 01/19/18  Yes Lockamy, Randi L, NP-C  tamsulosin (FLOMAX) 0.4 MG CAPS capsule Take 0.8 mg by mouth every morning.    Yes [provider]  triamterene-hydrochlorothiazide (MAXZIDE-25) 37.5-25 MG tablet Take 1 tablet by mouth daily. for high blood pressure 03/25/16  Yes [provider]  vitamin E 400 UNIT capsule Take 800 Units by mouth daily.   Yes [provider]  loperamide (IMODIUM) 1 MG/5ML solution Take 1 mg by mouth as needed for diarrhea or loose stools.    [provider]  prochlorperazine (COMPAZINE) 10 MG tablet Take 1 tablet (10 mg total) by mouth every 6 (six) hours as needed (Nausea or vomiting). Patient taking differently: Take 10 mg by mouth every 6 (six) hours as needed for nausea or vomiting.  01/16/16 08/27/17  Penland, Kelby Fam, MD     Vital Signs: BP (!) 161/96 (BP Location: Right Arm)   Pulse 64   Temp 97.7 F (36.5 C) (Oral)   Resp 18   Ht 5\' 10"  (1.778 m)   Wt 204 lb 5.9 oz (92.7 kg)   SpO2 99%   BMI 29.32 kg/m   Physical Exam awake, alert.  Chest clear to auscultation bilaterally anteriorly.  Heart with regular rate and rhythm.  Abdomen soft, positive bowel sounds, mild right upper quadrant/epig tenderness to palpation.  No significant lower extremity edema.  Imaging: Nm Hepato W/eject  Fract  Result Date: 02/12/2018 CLINICAL DATA:  Cholelithiasis, RIGHT upper quadrant pain, nausea and vomiting for 1 week, abnormal ultrasound question acute cholecystitis EXAM: NUCLEAR MEDICINE HEPATOBILIARY IMAGING TECHNIQUE: Sequential images of the abdomen were obtained out to 60 minutes following intravenous administration of radiopharmaceutical. Morphine was administered after 1 hour due to nonvisualization of the gallbladder on initial images. RADIOPHARMACEUTICALS:  5.5 mCi Tc-73m  Choletec IV COMPARISON:  None Correlation: CT abdomen and pelvis 02/07/2018, ultrasound abdomen 02/07/2018 FINDINGS: Normal tracer extraction from bloodstream indicating normal hepatocellular function. Normal excretion of tracer into biliary tree. Gallbladder did not visualized during the first hour of imaging. Small bowel visualized at 16 minutes min. A large photopenic defect is seen within the central liver corresponding to a large hepatic metastasis identified by CT. Additional less well-defined areas of photopenia are seen within the remainder of the liver, corresponding to additional metastases. At 1 hour patient received 3.6 mg of morphine sulfate and imaging was continued for 30 minutes. Gallbladder failed to visualize following morphine augmentation. Findings are consistent with acute cholecystitis. IMPRESSION:  Hepatic metastases. Patent CBD. Nonvisualization of the gallbladder despite morphine administration consistent with acute cholecystitis. Electronically Signed   By: Lavonia Dana M.D.   On: 02/12/2018 12:08   US Venous Img Lower Bilateral  Result Date: 02/13/2018 CLINICAL DATA:  History of pulmonary embolus. EXAM: BILATERAL LOWER EXTREMITY VENOUS DOPPLER ULTRASOUND TECHNIQUE: Gray-scale sonography with graded compression, as well as color Doppler and duplex ultrasound were performed to evaluate the lower extremity deep venous systems from the level of the common femoral vein and including the common femoral,  femoral, profunda femoral, popliteal and calf veins including the posterior tibial, peroneal and gastrocnemius veins when visible. The superficial great saphenous vein was also interrogated. Spectral Doppler was utilized to evaluate flow at rest and with distal augmentation maneuvers in the common femoral, femoral and popliteal veins. COMPARISON:  None. FINDINGS: RIGHT LOWER EXTREMITY Common Femoral Vein: No evidence of thrombus. Normal compressibility, respiratory phasicity and response to augmentation. Saphenofemoral Junction: No evidence of thrombus. Normal compressibility and flow on color Doppler imaging. Profunda Femoral Vein: No evidence of thrombus. Normal compressibility and flow on color Doppler imaging. Femoral Vein: No evidence of thrombus. Normal compressibility, respiratory phasicity and response to augmentation. Popliteal Vein: No evidence of thrombus. Normal compressibility, respiratory phasicity and response to augmentation. Calf Veins: No evidence of thrombus. Normal compressibility and flow on color Doppler imaging. Superficial Great Saphenous Vein: No evidence of thrombus. Normal compressibility. Venous Reflux:  None. Other Findings:  None. LEFT LOWER EXTREMITY Common Femoral Vein: No evidence of thrombus. Normal compressibility, respiratory phasicity and response to augmentation. Saphenofemoral Junction: No evidence of thrombus. Normal compressibility and flow on color Doppler imaging. Profunda Femoral Vein: No evidence of thrombus. Normal compressibility and flow on color Doppler imaging. Femoral Vein: No evidence of thrombus. Normal compressibility, respiratory phasicity and response to augmentation. Popliteal Vein: No evidence of thrombus. Normal compressibility, respiratory phasicity and response to augmentation. Calf Veins: No evidence of thrombus. Normal compressibility and flow on color Doppler imaging. Superficial Great Saphenous Vein: No evidence of thrombus. Normal compressibility.  Venous Reflux:  None. Other Findings:  None. IMPRESSION: No evidence of deep venous thrombosis. Electronically Signed   By: Rolm Baptise M.D.   On: 02/13/2018 16:55    Labs:  CBC: Recent Labs    02/11/18 1410 02/12/18 0644 02/13/18 0640 02/14/18 0730  WBC 8.0 7.3 6.6 4.2  HGB 9.3* 9.2* 8.1* 7.8*  HCT 30.9* 29.4* 26.6* 27.0*  PLT 203 218 216 210    COAGS: Recent Labs    05/22/17 1138 10/06/17 1705 02/07/18 0547 02/14/18 0843  INR 1.04 1.00  --  1.14  APTT  --   --  27  --     BMP: Recent Labs    02/11/18 1410 02/12/18 0441 02/13/18 0640 02/14/18 0730  NA 135 137 137 139  K 3.3* 3.3* 3.2* 3.4*  CL 103 107 108 108  CO2 22 20* 22 24  GLUCOSE 218* 159* 141* 107*  BUN 11 9 9  7*  CALCIUM 6.9* 6.6* 6.5* 6.6*  CREATININE 0.56* 0.49* 0.59* 0.48*  GFRNONAA >60 >60 >60 >60  GFRAA >60 >60 >60 >60    LIVER FUNCTION TESTS: Recent Labs    02/09/18 0539 02/10/18 0000 02/11/18 1410 02/12/18 0441  BILITOT 1.0 0.6 1.3* 1.2  AST 100* 61* 46* 36  ALT 29 27 31 28   ALKPHOS 277* 250* 259* 222*  PROT 5.7* 5.2* 6.1* 5.5*  ALBUMIN 2.6* 2.2* 2.5* 2.2*   Assessment/plan: Pt with history of recurrent cholecystitis/failed  conservative treatment and past medical history significant for diabetes, hypertension, hypothyroidism, metastatic prostate carcinoma with liver metastases as well as PE.  Recent lower extremity venous Dopplers revealed no evidence of DVT.  He is a poor surgical candidate and request now received for percutaneous cholecystostomy.  Recent imaging has revealed significant interval increase in the number and size of hepatic disease, diffuse thickening of the gallbladder wall, colonic diverticulosis and extensive osseous metastatic disease.  Nuclear medicine hepatobiliary scan revealed patent common bile duct with non-visualization of the gallbladder consistent with acute cholecystitis.  He is currently afebrile, WBC normal, hemoglobin 7.8, platelets 210k, PT/INR normal,  creatinine 0.48.  He is on IV Zosyn.Risks and benefits discussed with the patient/spouse including, but not limited to bleeding, infection, gallbladder perforation, bile leak, sepsis or even death.  All of the patient's questions were answered, patient is agreeable to proceed. Consent signed and in chart.  Procedure scheduled [facility] today at [facility].     Electronically Signed: D. Rowe Robert, PA-C 02/14/2018, 11:06 AM   I spent a total of 25 minutes at the the patient's bedside AND on the patient's hospital floor or unit, greater than 50% of which was counseling/coordinating care for percutaneous cholecystostomy    Patient ID: Micheal Davis, male   DOB: 02/05/1946, 72 y.o.   MRN: [id number]

## 2018-02-14 NOTE — Procedures (Signed)
Pre procedural Dx: Acute cholecysitis Post procedural Dx: Same  Technically successful US and Fluoro guided placement of a 10 Fr drainage catheter placement into the gallbladder lumen. Chole tube connected to gravity bag.  EBL: None  Complications: None immediate  Jay Messina Kosinski, MD Pager #: [phone]   

## 2018-02-14 NOTE — Sedation Documentation (Signed)
Transported to nurses station to await transport.

## 2018-02-14 NOTE — Progress Notes (Signed)
Patient returned from [facility] for procedure, when [facility] transferred patient from stretcher to bed, access to Left portacath was accidentally lost. Attempted access twice but unsuccessful. Peripheral IV access will be attempted. MD aware of situation. Vascular access has been contacted about portacath.

## 2018-02-15 LAB — GLUCOSE, CAPILLARY
GLUCOSE-CAPILLARY: 134 mg/dL — AB (ref 70–99)
Glucose-Capillary: 95 mg/dL (ref 70–99)
Glucose-Capillary: 130 mg/dL — ABNORMAL HIGH (ref 70–99)

## 2018-02-15 MED ORDER — PREDNISONE 10 MG PO TABS
10.0000 mg | ORAL_TABLET | Freq: Every day | ORAL | Status: DC
  Administered 2018-02-16: 10 mg via ORAL
  Filled 2018-02-15: qty 1

## 2018-02-15 MED ORDER — LEVOTHYROXINE SODIUM 25 MCG PO TABS
25.0000 ug | ORAL_TABLET | Freq: Every day | ORAL | Status: DC
  Administered 2018-02-16: 25 ug via ORAL
  Filled 2018-02-15: qty 1

## 2018-02-15 MED ORDER — HYDROMORPHONE HCL 1 MG/ML IJ SOLN
1.0000 mg | INTRAMUSCULAR | Status: DC | PRN
  Administered 2018-02-15 – 2018-02-16 (×6): 1 mg via INTRAVENOUS
  Filled 2018-02-15 (×6): qty 1

## 2018-02-15 MED ORDER — AMOXICILLIN-POT CLAVULANATE 875-125 MG PO TABS
1.0000 | ORAL_TABLET | Freq: Two times a day (BID) | ORAL | Status: DC
  Administered 2018-02-15 – 2018-02-16 (×2): 1 via ORAL
  Filled 2018-02-15 (×2): qty 1

## 2018-02-15 MED ORDER — MORPHINE SULFATE ER 15 MG PO TBCR
15.0000 mg | EXTENDED_RELEASE_TABLET | Freq: Two times a day (BID) | ORAL | Status: DC
  Administered 2018-02-15 – 2018-02-16 (×2): 15 mg via ORAL
  Filled 2018-02-15 (×2): qty 1

## 2018-02-15 MED ORDER — ATENOLOL 25 MG PO TABS
50.0000 mg | ORAL_TABLET | Freq: Every day | ORAL | Status: DC
  Administered 2018-02-15 – 2018-02-16 (×2): 50 mg via ORAL
  Filled 2018-02-15 (×2): qty 2

## 2018-02-15 MED ORDER — TAMSULOSIN HCL 0.4 MG PO CAPS
0.4000 mg | ORAL_CAPSULE | Freq: Every day | ORAL | Status: DC
  Administered 2018-02-15: 0.4 mg via ORAL
  Filled 2018-02-15: qty 1

## 2018-02-15 NOTE — Care Management Important Message (Signed)
Important Message  Patient Details  Name: Micheal Davis MRN: [id number] Date of Birth: Jul 20, 1945   Medicare Important Message Given:  Yes    Shelda Altes 02/15/2018, 11:21 AM

## 2018-02-15 NOTE — Evaluation (Signed)
Physical Therapy Evaluation Patient Details Name: Micheal Davis MRN: [id number] DOB: 1946/02/10 Today's Date: 02/15/2018   History of Present Illness  Micheal Davis is a 72 y.o. 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  History provided by patient and wife at bedside.  He was discharged from the hospital yesterday afternoon and was feeling okay.  He was able to tolerate soft foods until yesterday.  He woke up early this morning and started vomiting again.  He has been having severe epigastric/right upper quadrant abdominal pain.  He has had 6 episodes of vomiting today.  Denies having any fevers but feels cold.    Clinical Impression  Patient functioning near baseline for functional mobility and gait, demonstrates slightly labored cadence without loss of balance using tri-pod cane, limited mostly due to c/o fatigue having to occasionally lean on side rails in hallway for support.  Patient put back to bed after therapy with his spouse present in room.  Patient will benefit from continued physical therapy in hospital and recommended venue below to increase strength, balance, endurance for safe ADLs and gait.     Follow Up Recommendations Home health PT;Supervision - Intermittent    Equipment Recommendations  None recommended by PT    Recommendations for Other Services       Precautions / Restrictions Precautions Precautions: Fall Restrictions Weight Bearing Restrictions: No      Mobility  Bed Mobility Overal bed mobility: Modified Independent                Transfers Overall transfer level: Needs assistance Equipment used: Straight cane Transfers: Sit to/from Stand;Stand Pivot Transfers Sit to Stand: Supervision Stand pivot transfers: Supervision        General transfer comment: slightly labored movement  Ambulation/Gait Ambulation/Gait assistance: Supervision;Min guard Gait Distance (Feet): 45 Feet Assistive device: Straight cane Gait Pattern/deviations: Decreased step length - right;Decreased step length - left;Decreased stance time - left;Step-through pattern Gait velocity: decreased   General Gait Details: slightly labored cadence with occasional leaning on siderails while using tripod cane with mostly 2 point gait pattern, limited secondary to c/o fatigue  Stairs            Wheelchair Mobility    Modified Rankin (Stroke Patients Only)       Balance Overall balance assessment: Needs assistance Sitting-balance support: Feet supported;No upper extremity supported Sitting balance-Leahy Scale: Good     Standing balance support: Single extremity supported;During functional activity Standing balance-Leahy Scale: Fair Standing balance comment: using tripod cane                             Pertinent Vitals/Pain Pain Assessment: 0-10 Pain Score: 5  Pain Location: stomach Pain Descriptors / Indicators: Aching Pain Intervention(s): Limited activity within patient's tolerance;Monitored during session    [place] expects to be discharged to:: Private residence Living Arrangements: Spouse/significant other Available Help at Discharge: Family Type of Home: House Home Access: Stairs to enter Entrance Stairs-Rails: Left Entrance Stairs-Number of Steps: 3 Home Layout: One level Home Equipment: Cane - single point;Walker - 4 wheels;Shower seat;Bedside commode Additional Comments: borrowed Rollar, cane is a tripod, has lift chair    Prior Function Level of Independence: Independent with assistive device(s)         Comments:  household and community ambulator with Rollator if necessary, but usually uses tripod cane     Hand Dominance        Extremity/Trunk Assessment   Upper  Extremity Assessment Upper Extremity Assessment: Overall WFL for tasks assessed    Lower Extremity Assessment Lower Extremity Assessment: Generalized weakness    Cervical / Trunk Assessment Cervical / Trunk Assessment: Normal  Communication   Communication: No difficulties  Cognition Arousal/Alertness: Awake/alert Behavior During Therapy: WFL for tasks assessed/performed Overall Cognitive Status: Within Functional Limits for tasks assessed                                        General Comments      Exercises     Assessment/Plan    PT Assessment Patient needs continued PT services  PT Problem List Decreased strength;Decreased activity tolerance;Decreased balance;Decreased mobility       PT Treatment Interventions Gait training;Stair training;Functional mobility training;Therapeutic activities;Patient/family education;Therapeutic exercise    PT Goals (Current goals can be found in the Care Plan section)  Acute Rehab PT Goals Patient Stated Goal: return home  PT Goal Formulation: With patient/family Time For Goal Achievement: 02/22/18 Potential to Achieve Goals: Good    Frequency Min 3X/week   Barriers to discharge        Co-evaluation               AM-PAC PT "6 Clicks" Mobility  Outcome Measure Help needed turning from your back to your side while in a flat bed without using bedrails?: None Help needed moving from lying on your back to sitting on the side of a flat bed without using bedrails?: None Help needed moving to and from a bed to a chair (including a wheelchair)?: None Help needed standing up from a chair using your arms (e.g., wheelchair or bedside chair)?: A Little Help needed to walk in hospital room?: A Little Help needed climbing 3-5 steps with a railing? : A Little 6 Click Score: 21    End of Session   Activity Tolerance: Patient tolerated treatment well;Patient limited by fatigue Patient left: in bed;with call bell/phone  within reach;with family/visitor present Nurse Communication: Mobility status PT Visit Diagnosis: Unsteadiness on feet (R26.81);Other abnormalities of gait and mobility (R26.89);Muscle weakness (generalized) (M62.81)    Time: 8882-8003 PT Time Calculation (min) (ACUTE ONLY): 23 min   Charges:   PT Evaluation $PT Eval Moderate Complexity: 1 Mod PT Treatments $Therapeutic Activity: 23-37 mins        3:51 PM, 02/15/18 Lonell Grandchild, MPT Physical Therapist with [facility] [phone] office [phone] mobile phone

## 2018-02-15 NOTE — Progress Notes (Signed)
[facility] Progress Note     Subjective: Says feeling ok. Tolerated liquids this am but had some minor nausea. Otherwise pain better.   Objective: Vital signs in last 24 hours: Temp:  [97.5 F (36.4 C)-97.7 F (36.5 C)] 97.7 F (36.5 C) (12/16 0519) Pulse Rate:  [61-75] 61 (12/16 0519) Resp:  [11-19] 18 (12/15 2058) BP: (138-155)/(73-88) 138/77 (12/16 0519) SpO2:  [97 %-100 %] 98 % (12/16 0919) Last BM Date: 02/13/18  Intake/Output from previous day: 12/15 0701 - 12/16 0700 In: 1709.4 [I.V.:1602.8; IV Piggyback:96.6] Out: -  Intake/Output this shift: Total I/O In: 360 [P.O.:360] Out: -   General appearance: alert, cooperative and no distress Resp: normal work breathing GI: soft, nondistended, minimally tender RUQ,drain with bile   Lab Results:  Recent Labs    02/13/18 0640 02/14/18 0730  WBC 6.6 4.2  HGB 8.1* 7.8*  HCT 26.6* 27.0*  PLT 216 210   BMET Recent Labs    02/13/18 0640 02/14/18 0730  NA 137 139  K 3.2* 3.4*  CL 108 108  CO2 22 24  GLUCOSE 141* 107*  BUN 9 7*  CREATININE 0.59* 0.48*  CALCIUM 6.5* 6.6*   PT/INR Recent Labs    02/14/18 0843  LABPROT 14.5  INR 1.14    Studies/Results: US Venous Img Lower Bilateral  Result Date: 02/13/2018 CLINICAL DATA:  History of pulmonary embolus. EXAM: BILATERAL LOWER EXTREMITY VENOUS DOPPLER ULTRASOUND TECHNIQUE: Gray-scale sonography with graded compression, as well as color Doppler and duplex ultrasound were performed to evaluate the lower extremity deep venous systems from the level of the common femoral vein and including the common femoral, femoral, profunda femoral, popliteal and calf veins including the posterior tibial, peroneal and gastrocnemius veins when visible. The superficial great saphenous vein was also interrogated. Spectral Doppler was utilized to evaluate flow at rest and with distal augmentation maneuvers in the common femoral, femoral and popliteal veins.  COMPARISON:  None. FINDINGS: RIGHT LOWER EXTREMITY Common Femoral Vein: No evidence of thrombus. Normal compressibility, respiratory phasicity and response to augmentation. Saphenofemoral Junction: No evidence of thrombus. Normal compressibility and flow on color Doppler imaging. Profunda Femoral Vein: No evidence of thrombus. Normal compressibility and flow on color Doppler imaging. Femoral Vein: No evidence of thrombus. Normal compressibility, respiratory phasicity and response to augmentation. Popliteal Vein: No evidence of thrombus. Normal compressibility, respiratory phasicity and response to augmentation. Calf Veins: No evidence of thrombus. Normal compressibility and flow on color Doppler imaging. Superficial Great Saphenous Vein: No evidence of thrombus. Normal compressibility. Venous Reflux:  None. Other Findings:  None. LEFT LOWER EXTREMITY Common Femoral Vein: No evidence of thrombus. Normal compressibility, respiratory phasicity and response to augmentation. Saphenofemoral Junction: No evidence of thrombus. Normal compressibility and flow on color Doppler imaging. Profunda Femoral Vein: No evidence of thrombus. Normal compressibility and flow on color Doppler imaging. Femoral Vein: No evidence of thrombus. Normal compressibility, respiratory phasicity and response to augmentation. Popliteal Vein: No evidence of thrombus. Normal compressibility, respiratory phasicity and response to augmentation. Calf Veins: No evidence of thrombus. Normal compressibility and flow on color Doppler imaging. Superficial Great Saphenous Vein: No evidence of thrombus. Normal compressibility. Venous Reflux:  None. Other Findings:  None. IMPRESSION: No evidence of deep venous thrombosis. Electronically Signed   By: Rolm Baptise M.D.   On: 02/13/2018 16:55   Ir Perc Cholecystostomy  Result Date: 02/14/2018 INDICATION: History of metastatic prostate cancer now with acute cholecystitis. Unfortunately, patient has failed trial  of conservative management with antibiotics.  The patient has been deemed a poor operative candidate and as such, request made for placement of a image guided cholecystostomy tube for symptomatic/palliative purposes. EXAM: ULTRASOUND AND FLUOROSCOPIC-GUIDED CHOLECYSTOSTOMY TUBE PLACEMENT COMPARISON:  CT abdomen pelvis-02/07/2018; right upper quadrant abdominal ultrasound-02/07/2018; nuclear medicine HIDA scan-12/13/ MEDICATIONS: The patient is currently admitted to the hospital and on intravenous antibiotics. Antibiotics were administered within an appropriate time frame prior to skin puncture. ANESTHESIA/SEDATION: Moderate (conscious) sedation was employed during this procedure. A total of Versed 2 mg and Fentanyl 100 mcg was administered intravenously. Moderate Sedation Time: 17 minutes. The patient's level of consciousness and vital signs were monitored continuously by radiology nursing throughout the procedure under my direct supervision. CONTRAST:  15 cc Isovue-300-administered into the gallbladder fossa. FLUOROSCOPY TIME:  36 seconds (9 mGy) COMPLICATIONS: None immediate. PROCEDURE: Informed written consent was obtained from the patient after a discussion of the risks, benefits and alternatives to treatment. Questions regarding the procedure were encouraged and answered. A timeout was performed prior to the initiation of the procedure. The right upper abdominal quadrant was prepped and draped in the usual sterile fashion, and a sterile drape was applied covering the operative field. Maximum barrier sterile technique with sterile gowns and gloves were used for the procedure. A timeout was performed prior to the initiation of the procedure. Local anesthesia was provided with 1% lidocaine with epinephrine. Ultrasound scanning of the right upper quadrant demonstrates a moderately dilated gallbladder. Multiple mixed echogenic lesions were seen throughout the liver compatible with known advanced hepatic metastatic  disease Utilizing a transhepatic approach with special attention made to avoid any of the hepatic metastases, a 22 gauge needle was advanced into the gallbladder under direct ultrasound guidance. An ultrasound image was saved for documentation purposes. Appropriate intraluminal puncture was confirmed with the efflux of bile and advancement of an 0.018 wire into the gallbladder lumen. The needle was exchanged for an [place] set. A small amount of contrast was injected to confirm appropriate intraluminal positioning. Over a Benson wire, a 66.2-French Cook cholecystomy tube was advanced into the gallbladder fossa, coiled and locked. Bile was aspirated and a small amount of contrast was injected as several post procedural spot radiographic images were obtained in various obliquities. The catheter was secured to the skin with suture, connected to a drainage bag and a dressing was placed. The patient tolerated the procedure well without immediate post procedural complication. IMPRESSION: Successful ultrasound and fluoroscopic guided placement of a 10.2 French cholecystostomy tube. Electronically Signed   By: Sandi Mariscal M.D.   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  PRN for pain Soft diet Can transition back to po antibiotics for 14 day course to be completed Will need IR drain clinic follow up No plans for any surgery in the future  Agree with plans for hospice referral, family seems reluctant to speak of this with me today Can follow up with me to just see how he is doing in 2 weeks    LOS: 4 days     Micheal Davis 02/15/2018

## 2018-02-15 NOTE — Progress Notes (Signed)
PROGRESS NOTE    Micheal Davis  DPO:[id number] DOB: 1945-03-28 DOA: 02/11/2018 PCP: [name], MD    Brief Narrative:  72 y.o. 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  History provided by patient and wife at bedside.  He was discharged from the hospital yesterday afternoon and was feeling okay.  He was able to tolerate soft foods until yesterday.  He woke up early this morning and started vomiting again.  He has been having severe epigastric/right upper quadrant abdominal pain.  He has had 6 episodes of vomiting today.  Denies having any fevers but feels chills.  Patient was recently admitted from February 07, 2018 to February 10, 2018 for intractable nausea and vomiting secondary to acute cholecystitis.  Right upper quadrant ultrasound done at that time with findings consistent with acute cholecystitis; no evidence of biliary ductal dilatation.   Patient was discharge but symptoms became unbearable and has been readmitted. General surgery and IR consulted. Will need cholecystostomy.   Assessment & Plan: 1-Intractable nausea and vomiting: in the setting of acute cholecystitis  -positive Hida scan; [name] by IR on 12/15; will follow further recommendations for drain care. -will continue antibiotics, but will transition to PO -Continue PRN antiemetics, analgesics and advance diet further -prognosis is guarded -follow clinical response.   2-Prostate cancer metastatic to bone ([place]) -overall prognosis is poor -palliative care consulted. -continue outpatient follow up with oncology  -resume flomax   3-hypokalemia -in the setting of poor oral intake and GI loses -continue repletion as needed -Follow electrolytes trend.  4-hx  of PE -Continue holding anticoagulation indefinitely -LE neg for acute DVT.   5-Thyroid disease -continue synthroid; but transition to PO  6-Diabetes mellitus without complication (HCC) -will continue SSI and lantus  7-Current chronic use of systemic steroids -will discontinue solucortef  -resume prednisone at 10 mg daily for now; with eventual transition back to chronic 5 mg daily in the next 48 hours.   DVT prophylaxis: heparin Code Status: DNR Family Communication: wife at bedside  Disposition Plan: remains inpatient for now. Will continue antiemetics and analgesics; continue advancing diet and attempt transition of meds to PO. Continue to follow general surgery rec's.   Consultants:   General surgery  IR  Palliative care  Procedures:   Hida Scan: positive for cholecystitis   Antimicrobials:  Anti-infectives (From admission, onward)   Start     Dose/Rate Route Frequency Ordered Stop   02/15/18 2200  amoxicillin-clavulanate (AUGMENTIN) 875-125 MG per tablet 1 tablet     1 tablet Oral Every 12 hours 02/15/18 1656     02/11/18 1745  piperacillin-tazobactam (ZOSYN) IVPB 3.375 g  Status:  Discontinued     3.375 g 12.5 mL/hr over 240 Minutes Intravenous Every 8 hours 02/11/18 1731 02/15/18 1656      Subjective: No fever.  Patient continue experiencing intermittent episode of abdominal pain and nausea.  No further vomiting.  Denies chest pain or shortness of breath.  Objective: Vitals:   02/14/18 2058 02/15/18 0519 02/15/18 0919 02/15/18 1533  BP: (!) 149/88 138/77  (!) 146/86  Pulse: 75 61  65  Resp: 18   18  Temp: (!) 97.5 F (36.4 C) 97.7 F (36.5 C)  (!) 97.5 F (36.4 C)  TempSrc: Oral Oral  Oral  SpO2: 97%  97% 98% 99%  Weight:      Height:        Intake/Output Summary (Last 24 hours) at 02/15/2018 1700 Last data filed at 02/15/2018 1400 Gross per 24 hour  Intake 2714.84 ml  Output -  Net 2714.84 ml   Filed Weights   02/11/18 1408 02/11/18 1850    Weight: 90.7 kg 92.7 kg    Examination: General exam: Alert, awake, oriented x 3; afebrile, patient reports no further vomiting but expresses still having intermittent episode of nausea.  Abdominal pain better but is still present. Respiratory system: Clear to auscultation. Respiratory effort normal. Cardiovascular system:RRR. No murmurs, rubs, gallops. Gastrointestinal system: Abdomen is tender to deep palpation in his mid abdomen/right upper quadrant.  So far no guarding. Normal bowel sounds heard. Central nervous system: Alert and oriented. No focal neurological deficits. Extremities: No cyanosis or clubbing.  Trace to 1+ edema appreciated bilaterally up to his knees. Skin: No rashes, no petechiae.  Cholecystostomy tube in place.  No surrounding erythema or signs of skin infection. Psychiatry: Judgement and insight appear normal. Mood & affect appropriate.   Data Reviewed: I have personally reviewed following labs and imaging studies  CBC: Recent Labs  Lab 02/09/18 0539 02/11/18 1410 02/12/18 0644 02/13/18 0640 02/14/18 0730  WBC 5.4 8.0 7.3 6.6 4.2  NEUTROABS  --  7.1  --   --  3.3  HGB 8.6* 9.3* 9.2* 8.1* 7.8*  HCT 28.8* 30.9* 29.4* 26.6* 27.0*  MCV 95.7 92.2 91.6 92.4 93.4  PLT 201 203 218 216 027   Basic Metabolic Panel: Recent Labs  Lab 02/10/18 0000 02/11/18 1410 02/12/18 0441 02/13/18 0640 02/14/18 0730  NA 134* 135 137 137 139  K 3.7 3.3* 3.3* 3.2* 3.4*  CL 102 103 107 108 108  CO2 23 22 20* 22 24  GLUCOSE 176* 218* 159* 141* 107*  BUN 10 11 9 9  7*  CREATININE 0.58* 0.56* 0.49* 0.59* 0.48*  CALCIUM 6.5* 6.9* 6.6* 6.5* 6.6*  MG  --  2.0  --   --   --    GFR: Estimated Creatinine Clearance: 94.1 mL/min (A) (by C-G formula based on SCr of 0.48 mg/dL (L)).   Liver Function Tests: Recent Labs  Lab 02/09/18 0539 02/10/18 0000 02/11/18 1410 02/12/18 0441  AST 100* 61* 46* 36  ALT 29 27 31 28   ALKPHOS 277* 250* 259* 222*  BILITOT 1.0 0.6 1.3* 1.2   PROT 5.7* 5.2* 6.1* 5.5*  ALBUMIN 2.6* 2.2* 2.5* 2.2*   Recent Labs  Lab 02/11/18 1410  LIPASE 21   CBG: Recent Labs  Lab 02/13/18 1154 02/13/18 1703 02/14/18 0750 02/15/18 1118 02/15/18 1629  GLUCAP 117* 156* 102* 95 130*   Urine analysis:    Component Value Date/Time   COLORURINE YELLOW 02/07/2018 0308   APPEARANCEUR CLEAR 02/07/2018 0308   LABSPEC 1.018 02/07/2018 0308   PHURINE 7.0 02/07/2018 0308   GLUCOSEU NEGATIVE 02/07/2018 0308   HGBUR NEGATIVE 02/07/2018 0308   BILIRUBINUR NEGATIVE 02/07/2018 0308   KETONESUR NEGATIVE 02/07/2018 0308   PROTEINUR NEGATIVE 02/07/2018 0308   UROBILINOGEN 1.0 09/27/2010 1055   NITRITE NEGATIVE 02/07/2018 0308   LEUKOCYTESUR NEGATIVE 02/07/2018 0308   Radiology Studies: Ir Perc Cholecystostomy  Result Date: 02/14/2018 INDICATION: History of metastatic prostate cancer now with acute cholecystitis. Unfortunately, patient has failed trial of conservative management with antibiotics. The patient has been deemed a poor operative candidate and as such, request made for placement of a image guided cholecystostomy tube  for symptomatic/palliative purposes. EXAM: ULTRASOUND AND FLUOROSCOPIC-GUIDED CHOLECYSTOSTOMY TUBE PLACEMENT COMPARISON:  CT abdomen pelvis-02/07/2018; right upper quadrant abdominal ultrasound-02/07/2018; nuclear medicine HIDA scan-12/13/ MEDICATIONS: The patient is currently admitted to the hospital and on intravenous antibiotics. Antibiotics were administered within an appropriate time frame prior to skin puncture. ANESTHESIA/SEDATION: Moderate (conscious) sedation was employed during this procedure. A total of Versed 2 mg and Fentanyl 100 mcg was administered intravenously. Moderate Sedation Time: 17 minutes. The patient's level of consciousness and vital signs were monitored continuously by radiology nursing throughout the procedure under my direct supervision. CONTRAST:  15 cc Isovue-300-administered into the gallbladder  fossa. FLUOROSCOPY TIME:  36 seconds (9 mGy) COMPLICATIONS: None immediate. PROCEDURE: Informed written consent was obtained from the patient after a discussion of the risks, benefits and alternatives to treatment. Questions regarding the procedure were encouraged and answered. A timeout was performed prior to the initiation of the procedure. The right upper abdominal quadrant was prepped and draped in the usual sterile fashion, and a sterile drape was applied covering the operative field. Maximum barrier sterile technique with sterile gowns and gloves were used for the procedure. A timeout was performed prior to the initiation of the procedure. Local anesthesia was provided with 1% lidocaine with epinephrine. Ultrasound scanning of the right upper quadrant demonstrates a moderately dilated gallbladder. Multiple mixed echogenic lesions were seen throughout the liver compatible with known advanced hepatic metastatic disease Utilizing a transhepatic approach with special attention made to avoid any of the hepatic metastases, a 22 gauge needle was advanced into the gallbladder under direct ultrasound guidance. An ultrasound image was saved for documentation purposes. Appropriate intraluminal puncture was confirmed with the efflux of bile and advancement of an 0.018 wire into the gallbladder lumen. The needle was exchanged for an [place] set. A small amount of contrast was injected to confirm appropriate intraluminal positioning. Over a Benson wire, a 21.2-French Cook cholecystomy tube was advanced into the gallbladder fossa, coiled and locked. Bile was aspirated and a small amount of contrast was injected as several post procedural spot radiographic images were obtained in various obliquities. The catheter was secured to the skin with suture, connected to a drainage bag and a dressing was placed. The patient tolerated the procedure well without immediate post procedural complication. IMPRESSION: Successful ultrasound  and fluoroscopic guided placement of a 10.2 French cholecystostomy tube. Electronically Signed   By: Sandi Mariscal M.D.   On: 02/14/2018 13:56    Scheduled Meds: . amoxicillin-clavulanate  1 tablet Oral Q12H  . atenolol  50 mg Oral Daily  . chlorhexidine  15 mL Mouth/Throat BID  . insulin aspart  0-9 Units Subcutaneous TID WC  . insulin detemir  5 Units Subcutaneous QHS  . [START ON 02/16/2018] levothyroxine  25 mcg Oral Q0600  . morphine  15 mg Oral Q12H  . [START ON 02/16/2018] predniSONE  10 mg Oral Q breakfast  . tamsulosin  0.4 mg Oral QPC supper   Continuous Infusions: . lactated ringers 75 mL/hr at 02/15/18 1400     LOS: 4 days    Time spent: 30 minutes    Barton Dubois, MD [facility] Pager [phone]  If 7PM-7AM, please contact night-coverage [email or web address] Password [email or web address] 02/15/2018, 5:00 PM

## 2018-02-15 NOTE — Plan of Care (Signed)
  Problem: Acute Rehab PT Goals(only PT should resolve) Goal: Pt Will Go Supine/Side To Sit Outcome: Progressing Flowsheets (Taken 02/15/2018 1553) Pt will go Supine/Side to Sit: Independently Goal: Patient Will Transfer Sit To/From Stand Outcome: Progressing Flowsheets (Taken 02/15/2018 1553) Patient will transfer sit to/from stand: with modified independence Goal: Pt Will Transfer Bed To Chair/Chair To Bed Outcome: Progressing Flowsheets (Taken 02/15/2018 1553) Pt will Transfer Bed to Chair/Chair to Bed: with modified independence Goal: Pt Will Ambulate Outcome: Progressing Flowsheets (Taken 02/15/2018 1553) Pt will Ambulate: 75 feet; with supervision; with cane   3:53 PM, 02/15/18 Lonell Grandchild, MPT Physical Therapist with [facility] 336 [phone] office [phone] mobile phone

## 2018-02-16 DIAGNOSIS — I10 Essential (primary) hypertension: Secondary | ICD-10-CM

## 2018-02-16 DIAGNOSIS — I824Z9 Acute embolism and thrombosis of unspecified deep veins of unspecified distal lower extremity: Secondary | ICD-10-CM

## 2018-02-16 DIAGNOSIS — Z7189 Other specified counseling: Secondary | ICD-10-CM

## 2018-02-16 DIAGNOSIS — Z515 Encounter for palliative care: Secondary | ICD-10-CM

## 2018-02-16 DIAGNOSIS — I82409 Acute embolism and thrombosis of unspecified deep veins of unspecified lower extremity: Secondary | ICD-10-CM

## 2018-02-16 DIAGNOSIS — G620 Drug-induced polyneuropathy: Secondary | ICD-10-CM

## 2018-02-16 DIAGNOSIS — E119 Type 2 diabetes mellitus without complications: Secondary | ICD-10-CM

## 2018-02-16 DIAGNOSIS — T451X5A Adverse effect of antineoplastic and immunosuppressive drugs, initial encounter: Secondary | ICD-10-CM

## 2018-02-16 LAB — BASIC METABOLIC PANEL
Anion gap: 7 (ref 5–15)
Anion gap: 7 (ref 5–15)
BUN: 8 mg/dL (ref 8–23)
BUN: 7 mg/dL — ABNORMAL LOW (ref 8–23)
CHLORIDE: 108 mmol/L (ref 98–111)
CO2: 25 mmol/L (ref 22–32)
CO2: 23 mmol/L (ref 22–32)
CREATININE: 0.53 mg/dL — AB (ref 0.61–1.24)
Calcium: 6.6 mg/dL — ABNORMAL LOW (ref 8.9–10.3)
Calcium: 6.7 mg/dL — ABNORMAL LOW (ref 8.9–10.3)
Chloride: 107 mmol/L (ref 98–111)
Creatinine, Ser: 0.56 mg/dL — ABNORMAL LOW (ref 0.61–1.24)
GFR calc Af Amer: >60 mL/min (ref >60)
GFR calc Af Amer: >60 mL/min (ref >60)
GFR calc non Af Amer: >60 mL/min (ref >60)
GFR calc non Af Amer: >60 mL/min (ref >60)
Glucose, Bld: 83 mg/dL (ref 70–99)
Glucose, Bld: 181 mg/dL — ABNORMAL HIGH (ref 70–99)
POTASSIUM: 2.7 mmol/L — AB (ref 3.5–5.1)
Potassium: 3.5 mmol/L (ref 3.5–5.1)
SODIUM: 138 mmol/L (ref 135–145)
Sodium: 139 mmol/L (ref 135–145)

## 2018-02-16 LAB — GLUCOSE, CAPILLARY
Glucose-Capillary: 77 mg/dL (ref 70–99)
Glucose-Capillary: 131 mg/dL — ABNORMAL HIGH (ref 70–99)
Glucose-Capillary: 115 mg/dL — ABNORMAL HIGH (ref 70–99)

## 2018-02-16 MED ORDER — GABAPENTIN 300 MG PO CAPS: 300.0000 mg | ORAL_CAPSULE | Freq: Two times a day (BID) | ORAL | Status: AC

## 2018-02-16 MED ORDER — HEPARIN SOD (PORK) LOCK FLUSH 100 UNIT/ML IV SOLN
500.0000 [IU] | Freq: Once | INTRAVENOUS | Status: AC
  Administered 2018-02-16: 500 [IU]
  Filled 2018-02-16: qty 5

## 2018-02-16 MED ORDER — MAGNESIUM SULFATE 2 GM/50ML IV SOLN
2.0000 g | Freq: Once | INTRAVENOUS | Status: AC
  Administered 2018-02-16: 2 g via INTRAVENOUS
  Filled 2018-02-16: qty 50

## 2018-02-16 MED ORDER — POTASSIUM CHLORIDE CRYS ER 20 MEQ PO TBCR
40.0000 meq | EXTENDED_RELEASE_TABLET | ORAL | Status: DC
  Administered 2018-02-16 (×2): 40 meq via ORAL
  Filled 2018-02-16 (×2): qty 2

## 2018-02-16 MED ORDER — HEPARIN SOD (PORK) LOCK FLUSH 100 UNIT/ML IV SOLN: 500.0000 [IU] | INTRAVENOUS | Status: DC | PRN

## 2018-02-16 MED ORDER — AMOXICILLIN-POT CLAVULANATE 875-125 MG PO TABS: 1.0000 | ORAL_TABLET | Freq: Two times a day (BID) | ORAL | 0 refills | Status: AC

## 2018-02-16 NOTE — Progress Notes (Signed)
Notified night MD of potassium.  No new orders received at this time.

## 2018-02-16 NOTE — Progress Notes (Signed)
    02/14/18:  IMPRESSION: Successful ultrasound and fluoroscopic guided placement of a 10.2 French cholecystostomy tube.  RN reports OP is good Dark bilious fluid~ 40 cc daily (asked her to record OP)  Site is clean and dry NT No bleeding Afeb Wbc wnl  OP IR clinic follow up in place Pt will hear from scheduler for time and date  Upon DC: pt should be flushing drain 5-10 cc sterile saline daily Record OP

## 2018-02-16 NOTE — Discharge Summary (Signed)
Physician Discharge Summary  Micheal Davis HEN:[id number] DOB: Jan 18, 1946 DOA: 02/11/2018  PCP: [name], MD  Admit date: 02/11/2018 Discharge date: 02/16/2018  Time spent: 35 minutes  Recommendations for Outpatient Follow-up:  1. Repeat basic metabolic panel to follow electrolytes and renal function 2. Reassess blood pressure and further adjust antihypertensive regimen   Discharge Diagnoses:  Principal Problem:   Intractable nausea and vomiting Active Problems:   Prostate cancer metastatic to bone (HCC)   Hypertension   Thyroid disease   Diabetes mellitus without complication ([place])   History of pulmonary embolus (PE)   Current chronic use of systemic steroids   Prostate cancer metastatic to multiple sites [facility])   Acute cholecystitis   Hypokalemia   Chronic anemia   Physical deconditioning   Chemotherapy-induced peripheral neuropathy (HCC)   DVT (deep venous thrombosis) ([place])   Discharge Condition: Stable and improved.  Patient has been discharged home with instructions to follow-up with general surgery, PCP and oncology service as an outpatient.  IR has also scheduled follow-up to further assess his right upper quadrant percutaneous cholecystostomy drain.  Diet recommendation: Heart healthy/modified carbohydrate diet.  Filed Weights   02/11/18 1408 02/11/18 1850  Weight: 90.7 kg 92.7 kg    History of present illness:  As per H&P written by Dr. Marlowe Sax on 02/11/2018 72 y.o. 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  History provided by patient and wife at bedside.  He was discharged from the hospital yesterday afternoon and was feeling okay.  He was able to tolerate soft foods until yesterday.  He woke up early this morning and started vomiting again.   He has been having severe epigastric/right upper quadrant abdominal pain.  He has had 6 episodes of vomiting today.  Denies having any fevers but feels cold.  Patient was recently admitted from February 07, 2018 to February 10, 2018 for intractable nausea and vomiting secondary to acute cholecystitis.  Right upper quadrant ultrasound done at that time with findings consistent with acute cholecystitis; no evidence of biliary ductal dilatation. CT abdomen pelvis showing significant interval increase in the number and size of hepatic metastatic disease compared to prior CT. Patient was seen by general surgery and thought not to be a surgical candidate.  He was discharged with Augmentin to complete a 14-day antibiotic course.  ED Course: Afebrile and no leukocytosis.  Lipase normal.  AST borderline elevated at 46, improved since previous labs. Alkaline phosphatase 259, not significantly changed since labs done during his prior hospitalization.  ALT normal.  T bili borderline elevated at 1.3. ED provider spoke to Dr. Constance Haw from general surgery who recommended cholecystostomy tube with IR and no plans for surgery at this time.  ED provider discussed the case with Dr. Anselm Pancoast from IR at [facility] who recommended doing a HIDA scan first and then consulting IR over the phone to discuss what the next step in management would be.  Hospital Course:  1-Intractable nausea and vomiting: in the setting of acute cholecystitis  -positive Hida scan; [name] by IR on 12/15; will follow further recommendations for drain care. -will continue antibiotics by mouth for 8 more days. -Continue PRN antiemetics, analgesics and low fat diet. -follow clinical response.   2-Prostate cancer metastatic to bone ([place]) -overall prognosis is poor -palliative care consultation appreciated. -  continue outpatient follow up with oncology  -resume flomax  -might be a good candidate for hospice    3-hypokalemia -in the setting of poor oral intake and GI loses -Repleted and patient discharged home daily potassium supplementation -Potassium at discharge 3.5  4-hx of PE -Continue holding anticoagulation indefinitely; in the setting of increase risk of bleeding from percutaneous cholecystostomy drain. -LE neg for acute DVT.   5-Thyroid disease -continue synthroid  6-Diabetes mellitus without complication ([place]) -will resume home hypoglycemic regimen; was no using medications and mainly monitoring diet along with modify carbohydrates intake.  7-Current chronic use of systemic steroids -Vital signs overall stable -Will resume chronic 5 mg daily of prednisone.  8-Hypertension We will resume home antihypertensive regimen Heart healthy diet has been discussed with patient.  9-chronic pain syndrome: Associated with cancer pain -Resume home analgesic regimen using MS Contin and immediate release morphine.   Procedures:  See below for x-ray reports  Status post cholecystostomy 02/14/18  Lower extremity duplex; negative for any acute DVT  Consultations:  General surgery  IR  Palliative care  Discharge Exam: Vitals:   02/15/18 2118 02/16/18 0509  BP: 126/75 119/68  Pulse: 67 61  Resp:  18  Temp: 97.6 F (36.4 C) 98.1 F (36.7 C)  SpO2: 98% 98%   General exam: Alert, awake, oriented x 3; afebrile, feeling much better and currently reporting well-controlled abdominal discomfort with ongoing pain medication and no further nausea or vomiting.  Patient tolerating diet. Respiratory system: Clear to auscultation. Respiratory effort normal. Cardiovascular system:RRR. No murmurs, rubs, gallops. Gastrointestinal system: Abdomen is slightly tender to deep palpation in his mid abdomen/right upper quadrant around percutaneous drain.  No guarding. Normal bowel sounds heard. Central nervous system: Alert and oriented. No focal neurological deficits. Extremities: No  cyanosis or clubbing.  Trace to 1+ edema appreciated bilaterally up to his knees. Skin: No rashes, no petechiae.  Cholecystostomy tube in place.  No surrounding erythema or signs of skin infection. Psychiatry: Judgement and insight appear normal. Mood & affect appropriate.    Discharge Instructions   Discharge Instructions    Diet - low sodium heart healthy   Complete by:  As directed    Discharge instructions   Complete by:  As directed    Keep yourself well-hydrated Take medications as prescribed Follow low-fat diet Outpatient follow-up with general surgery as instructed Follow-up with interventional radiologist as a schedule. Follow-up with oncology service as previously arranged.     Allergies as of 02/16/2018   No Known Allergies     Medication List    STOP taking these medications   rivaroxaban 10 MG Tabs tablet Commonly known as:  XARELTO   triamterene-hydrochlorothiazide 37.5-25 MG tablet Commonly known as:  MAXZIDE-25     TAKE these medications   amoxicillin-clavulanate 875-125 MG tablet Commonly known as:  AUGMENTIN Take 1 tablet by mouth 2 (two) times daily for 8 days. Do not buy if had at home already; only update days and length of therapy What changed:  additional instructions   atenolol 100 MG tablet Commonly known as:  TENORMIN Take 100 mg by mouth daily.   atorvastatin 20 MG tablet Commonly known as:  LIPITOR Take 10 mg by mouth every morning.   CALCIUM PLUS VITAMIN D3 600-500 MG-UNIT Caps Generic drug:  Calcium Carb-Cholecalciferol Take 1,200 mg by mouth daily.   chlorhexidine 0.12 % solution Commonly known as:  PERIDEX Use as directed 15 mLs in the mouth or throat 2 (two) times daily.  cyclobenzaprine 10 MG tablet Commonly known as:  FLEXERIL Take 10 mg by mouth 3 (three) times daily as needed for muscle spasms.   diltiazem 300 MG 24 hr capsule Commonly known as:  CARDIZEM CD Take 300 mg by mouth daily.   FISH OIL PO Take 1 capsule  by mouth daily.   gabapentin 300 MG capsule Commonly known as:  NEURONTIN Take 1 capsule (300 mg total) by mouth 2 (two) times daily. Patient may take 1 capsule in the morning and 2 capsules @ bedtime.   GLUCOSAMINE 1500 COMPLEX PO Take 1 tablet by mouth 2 (two) times daily.   levothyroxine 25 MCG tablet Commonly known as:  SYNTHROID, LEVOTHROID Take 25 mcg by mouth daily before breakfast.   loperamide 1 MG/5ML solution Commonly known as:  IMODIUM Take 1 mg by mouth as needed for diarrhea or loose stools.   morphine 30 MG tablet Commonly known as:  MSIR Take 1 tablet (30 mg total) by mouth every 6 (six) hours as needed for severe pain.   morphine 60 MG 12 hr tablet Commonly known as:  MS CONTIN Take 1 tablet (60 mg total) by mouth every 12 (twelve) hours.   multivitamin with minerals Tabs tablet Take 1 tablet by mouth daily.   ondansetron 8 MG disintegrating tablet Commonly known as:  ZOFRAN-ODT Take 1 tablet (8 mg total) by mouth every 8 (eight) hours as needed for nausea or vomiting.   potassium chloride SA 20 MEQ tablet Commonly known as:  KLOR-CON M20 TAKE 20 MEQ BY MOUTH ONCE DAILY What changed:    how much to take  how to take this  when to take this   predniSONE 5 MG tablet Commonly known as:  DELTASONE Take 1 tablet (5 mg total) by mouth 2 (two) times daily. What changed:    how much to take  when to take this   tamsulosin 0.4 MG Caps capsule Commonly known as:  FLOMAX Take 0.8 mg by mouth every morning.   vitamin E 400 UNIT capsule Take 800 Units by mouth daily.      No Known Allergies Follow-up Information    Sandi Mariscal, MD Follow up in 6 week(s).   Specialties:  Interventional Radiology, Radiology Why:  pt will hear from [facility] for follow up appt-- call [phone] if any questions Contact information: [address] [place] [address] [phone]        [name], MD. Schedule an appointment as soon  as possible for a visit in 2 week(s).   Specialty:  Family Medicine Contact information: [place] [address] [phone]           The results of significant diagnostics from this hospitalization (including imaging, microbiology, ancillary and laboratory) are listed below for reference.    Significant Diagnostic Studies: Mr Jeri Cos AS Contrast  Result Date: 02/05/2018 CLINICAL DATA:  Metastatic prostate cancer. Right-sided headache over the last 3 weeks. Tingling and numbness of the left arm. EXAM: MRI HEAD WITHOUT AND WITH CONTRAST TECHNIQUE: Multiplanar, multiecho pulse sequences of the brain and surrounding structures were obtained without and with intravenous contrast. CONTRAST:  10 cc Gadavist COMPARISON:  Head CT 10/06/2017 and 10/20/2012. FINDINGS: Brain: Diffusion imaging does not show any acute or subacute infarction. There are mild chronic small-vessel ischemic changes of the cerebral hemispheric white matter. No evidence of metastatic disease. No hydrocephalus. There is been resorption of the previously seen right subdural hematoma. There is  now only right-sided dural thickening measuring maximal about 3 mm. Mild dural thickening and enhancement seen elsewhere, reactive. No recurrent subdural hematoma. No mass effect or midline shift. Vascular: Major vessels at the base of the brain show flow. Skull and upper cervical spine: Widespread metastatic disease affecting the calvarium and the skull base as seen previously. No evidence of extraosseous tumor. Sinuses/Orbits: Acute right maxillary sinusitis which could be symptomatic. Other sinuses are clear. Orbits negative. Other: None IMPRESSION: Acute right-sided maxillary sinusitis which could be a cause of pain. No acute brain finding. Mild chronic small-vessel change of the white matter. No evidence of metastatic disease to the brain. Resorption of the previously seen right subdural hematoma. Mild dural thickening on the right,  maximal thickness 3 mm. No mass effect. Osseous metastatic disease affecting the calvarium and the skull base. Electronically Signed   By: Nelson Chimes M.D.   On: 02/05/2018 14:06   Ct Abdomen Pelvis W Contrast  Result Date: 02/07/2018 CLINICAL DATA:  72 year old male with epigastric abdominal pain and vomiting. History of prostate cancer metastatic to bone. EXAM: CT ABDOMEN AND PELVIS WITH CONTRAST TECHNIQUE: Multidetector CT imaging of the abdomen and pelvis was performed using the standard protocol following bolus administration of intravenous contrast. CONTRAST:  171mL ISOVUE-300 IOPAMIDOL (ISOVUE-300) INJECTION 61% COMPARISON:  CT of the abdomen pelvis dated 10/06/2017 FINDINGS: Lower chest: Minimal bibasilar atelectatic changes. There is mild cardiomegaly. No intra-abdominal free air or free fluid. Hepatobiliary: Multiple hepatic metastatic disease with the largest measuring approximately 9.1 x 9.7 cm (previously 5.1 x 5.5 cm) in segment IV. Significant interval increase in the number and size of the metastatic disease compared to the prior CT. There is diffuse thickening of the gallbladder wall which may be related to underlying systemic process or liver dysfunction. Further evaluation with ultrasound recommended to exclude acute cholecystitis. There is a noncalcified stone within the gallbladder. Pancreas: Unremarkable. No pancreatic ductal dilatation or surrounding inflammatory changes. Spleen: Normal in size without focal abnormality. Adrenals/Urinary Tract: The adrenal glands are unremarkable. There is no hydronephrosis on either side. There is a 15 mm right renal upper pole cyst. Smaller left renal hypodense lesion is not characterized. The visualized ureters appear unremarkable. The urinary bladder is trabeculated. Stomach/Bowel: Scattered sigmoid diverticula without active inflammatory changes. Normal caliber fecalized loops of small bowel may represent increased transit time or small intestine  bacterial overgrowth. There is no bowel obstruction or active inflammation. Normal appendix. Vascular/Lymphatic: Moderate aortoiliac atherosclerotic disease. No portal venous gas. There is no adenopathy. Reproductive: The prostate and seminal vesicles are grossly unremarkable. Other: None Musculoskeletal: Extensive osseous metastatic disease. Bilateral L5 pars defects. No acute osseous pathology. IMPRESSION: 1. Significant interval increase in the number and size of the hepatic metastatic disease compared to the prior CT. 2. Diffuse thickening of the gallbladder wall may be related to underlying systemic process or liver dysfunction. Further evaluation with ultrasound recommended. 3. Colonic diverticulosis. No bowel obstruction or active inflammation. Normal appendix. 4. Extensive osseous metastatic disease. Electronically Signed   By: Anner Crete M.D.   On: 02/07/2018 03:42   Nm Hepato W/eject Fract  Result Date: 02/12/2018 CLINICAL DATA:  Cholelithiasis, RIGHT upper quadrant pain, nausea and vomiting for 1 week, abnormal ultrasound question acute cholecystitis EXAM: NUCLEAR MEDICINE HEPATOBILIARY IMAGING TECHNIQUE: Sequential images of the abdomen were obtained out to 60 minutes following intravenous administration of radiopharmaceutical. Morphine was administered after 1 hour due to nonvisualization of the gallbladder on initial images. RADIOPHARMACEUTICALS:  5.5 mCi Tc-92m  Choletec IV COMPARISON:  None Correlation: CT abdomen and pelvis 02/07/2018, ultrasound abdomen 02/07/2018 FINDINGS: Normal tracer extraction from bloodstream indicating normal hepatocellular function. Normal excretion of tracer into biliary tree. Gallbladder did not visualized during the first hour of imaging. Small bowel visualized at 16 minutes min. A large photopenic defect is seen within the central liver corresponding to a large hepatic metastasis identified by CT. Additional less well-defined areas of photopenia are seen  within the remainder of the liver, corresponding to additional metastases. At 1 hour patient received 3.6 mg of morphine sulfate and imaging was continued for 30 minutes. Gallbladder failed to visualize following morphine augmentation. Findings are consistent with acute cholecystitis. IMPRESSION: Hepatic metastases. Patent CBD. Nonvisualization of the gallbladder despite morphine administration consistent with acute cholecystitis. Electronically Signed   By: Lavonia Dana M.D.   On: 02/12/2018 12:08   US Venous Img Lower Bilateral  Result Date: 02/13/2018 CLINICAL DATA:  History of pulmonary embolus. EXAM: BILATERAL LOWER EXTREMITY VENOUS DOPPLER ULTRASOUND TECHNIQUE: Gray-scale sonography with graded compression, as well as color Doppler and duplex ultrasound were performed to evaluate the lower extremity deep venous systems from the level of the common femoral vein and including the common femoral, femoral, profunda femoral, popliteal and calf veins including the posterior tibial, peroneal and gastrocnemius veins when visible. The superficial great saphenous vein was also interrogated. Spectral Doppler was utilized to evaluate flow at rest and with distal augmentation maneuvers in the common femoral, femoral and popliteal veins. COMPARISON:  None. FINDINGS: RIGHT LOWER EXTREMITY Common Femoral Vein: No evidence of thrombus. Normal compressibility, respiratory phasicity and response to augmentation. Saphenofemoral Junction: No evidence of thrombus. Normal compressibility and flow on color Doppler imaging. Profunda Femoral Vein: No evidence of thrombus. Normal compressibility and flow on color Doppler imaging. Femoral Vein: No evidence of thrombus. Normal compressibility, respiratory phasicity and response to augmentation. Popliteal Vein: No evidence of thrombus. Normal compressibility, respiratory phasicity and response to augmentation. Calf Veins: No evidence of thrombus. Normal compressibility and flow on  color Doppler imaging. Superficial Great Saphenous Vein: No evidence of thrombus. Normal compressibility. Venous Reflux:  None. Other Findings:  None. LEFT LOWER EXTREMITY Common Femoral Vein: No evidence of thrombus. Normal compressibility, respiratory phasicity and response to augmentation. Saphenofemoral Junction: No evidence of thrombus. Normal compressibility and flow on color Doppler imaging. Profunda Femoral Vein: No evidence of thrombus. Normal compressibility and flow on color Doppler imaging. Femoral Vein: No evidence of thrombus. Normal compressibility, respiratory phasicity and response to augmentation. Popliteal Vein: No evidence of thrombus. Normal compressibility, respiratory phasicity and response to augmentation. Calf Veins: No evidence of thrombus. Normal compressibility and flow on color Doppler imaging. Superficial Great Saphenous Vein: No evidence of thrombus. Normal compressibility. Venous Reflux:  None. Other Findings:  None. IMPRESSION: No evidence of deep venous thrombosis. Electronically Signed   By: Rolm Baptise M.D.   On: 02/13/2018 16:55   Ir Perc Cholecystostomy  Result Date: 02/14/2018 INDICATION: History of metastatic prostate cancer now with acute cholecystitis. Unfortunately, patient has failed trial of conservative management with antibiotics. The patient has been deemed a poor operative candidate and as such, request made for placement of a image guided cholecystostomy tube for symptomatic/palliative purposes. EXAM: ULTRASOUND AND FLUOROSCOPIC-GUIDED CHOLECYSTOSTOMY TUBE PLACEMENT COMPARISON:  CT abdomen pelvis-02/07/2018; right upper quadrant abdominal ultrasound-02/07/2018; nuclear medicine HIDA scan-12/13/ MEDICATIONS: The patient is currently admitted to the hospital and on intravenous antibiotics. Antibiotics were administered within an appropriate time frame prior to skin puncture. ANESTHESIA/SEDATION: Moderate (conscious) sedation was  employed during this procedure. A  total of Versed 2 mg and Fentanyl 100 mcg was administered intravenously. Moderate Sedation Time: 17 minutes. The patient's level of consciousness and vital signs were monitored continuously by radiology nursing throughout the procedure under my direct supervision. CONTRAST:  15 cc Isovue-300-administered into the gallbladder fossa. FLUOROSCOPY TIME:  36 seconds (9 mGy) COMPLICATIONS: None immediate. PROCEDURE: Informed written consent was obtained from the patient after a discussion of the risks, benefits and alternatives to treatment. Questions regarding the procedure were encouraged and answered. A timeout was performed prior to the initiation of the procedure. The right upper abdominal quadrant was prepped and draped in the usual sterile fashion, and a sterile drape was applied covering the operative field. Maximum barrier sterile technique with sterile gowns and gloves were used for the procedure. A timeout was performed prior to the initiation of the procedure. Local anesthesia was provided with 1% lidocaine with epinephrine. Ultrasound scanning of the right upper quadrant demonstrates a moderately dilated gallbladder. Multiple mixed echogenic lesions were seen throughout the liver compatible with known advanced hepatic metastatic disease Utilizing a transhepatic approach with special attention made to avoid any of the hepatic metastases, a 22 gauge needle was advanced into the gallbladder under direct ultrasound guidance. An ultrasound image was saved for documentation purposes. Appropriate intraluminal puncture was confirmed with the efflux of bile and advancement of an 0.018 wire into the gallbladder lumen. The needle was exchanged for an [place] set. A small amount of contrast was injected to confirm appropriate intraluminal positioning. Over a Benson wire, a 81.2-French Cook cholecystomy tube was advanced into the gallbladder fossa, coiled and locked. Bile was aspirated and a small amount of contrast  was injected as several post procedural spot radiographic images were obtained in various obliquities. The catheter was secured to the skin with suture, connected to a drainage bag and a dressing was placed. The patient tolerated the procedure well without immediate post procedural complication. IMPRESSION: Successful ultrasound and fluoroscopic guided placement of a 10.2 French cholecystostomy tube. Electronically Signed   By: Sandi Mariscal M.D.   On: 02/14/2018 13:56   US Abdomen Limited Ruq  Result Date: 02/07/2018 CLINICAL DATA:  Epigastric pain and vomiting for 2 weeks. Cholelithiasis. Metastatic prostate carcinoma. EXAM: ULTRASOUND ABDOMEN LIMITED RIGHT UPPER QUADRANT COMPARISON:  CT on 02/07/2018 FINDINGS: Gallbladder: Multiple gallstones are seen measuring up to 2 cm. Diffuse gallbladder wall thickening is seen measuring up to 12 mm. Positive sonographic Murphy sign noted by sonographer. These findings are consistent with acute cholecystitis. Common bile duct: Diameter: 4 mm, within normal limits. Liver: Multiple solid hepatic masses are again seen both the right and left lobes, largest measuring approximately 10 cm, consistent with diffuse liver metastases. Portal vein is patent on color Doppler imaging with normal direction of blood flow towards the liver. IMPRESSION: Findings consistent with acute cholecystitis. No evidence of biliary ductal dilatation. Diffuse liver metastases. Electronically Signed   By: Earle Gell M.D.   On: 02/07/2018 09:30   Labs: Basic Metabolic Panel: Recent Labs  Lab 02/11/18 1410 02/12/18 0441 02/13/18 0640 02/14/18 0730 02/16/18 0452 02/16/18 1531  NA 135 137 137 139 139 138  K 3.3* 3.3* 3.2* 3.4* 2.7* 3.5  CL 103 107 108 108 107 108  CO2 22 20* 22 24 25 23   GLUCOSE 218* 159* 141* 107* 83 181*  BUN 11 9 9  7* 8 7*  CREATININE 0.56* 0.49* 0.59* 0.48* 0.56* 0.53*  CALCIUM 6.9* 6.6* 6.5* 6.6* 6.6* 6.7*  MG 2.0  --   --   --   --   --    Liver Function  Tests: Recent Labs  Lab 02/10/18 0000 02/11/18 1410 02/12/18 0441  AST 61* 46* 36  ALT 27 31 28   ALKPHOS 250* 259* 222*  BILITOT 0.6 1.3* 1.2  PROT 5.2* 6.1* 5.5*  ALBUMIN 2.2* 2.5* 2.2*   Recent Labs  Lab 02/11/18 1410  LIPASE 21   CBC: Recent Labs  Lab 02/11/18 1410 02/12/18 0644 02/13/18 0640 02/14/18 0730  WBC 8.0 7.3 6.6 4.2  NEUTROABS 7.1  --   --  3.3  HGB 9.3* 9.2* 8.1* 7.8*  HCT 30.9* 29.4* 26.6* 27.0*  MCV 92.2 91.6 92.4 93.4  PLT 203 218 216 210   BNP (last 3 results) Recent Labs    10/06/17 1705  BNP 223.0*   CBG: Recent Labs  Lab 02/15/18 1118 02/15/18 1629 02/15/18 2120 02/16/18 0805 02/16/18 1200  GLUCAP 95 130* 134* 77 131*    Signed:  Barton Dubois MD.  [facility] 02/16/2018, 4:50 PM

## 2018-02-16 NOTE — Consult Note (Signed)
                                                                                 Consultation Note Date: 02/16/2018   Patient Name: Micheal Davis  DOB: 01/03/1946  MRN: [id number]  Age / Sex: 72 y.o., male  PCP: [name], Steven E, MD Referring Physician: Madera, Carlos, MD  Reason for Consultation: Establishing goals of care  HPI/Patient Profile: 72 y.o. male  with past medical history of DM2, HTN, prostate cancer with mets to bone and liver s/p multiple rounds of chemotherapy now being treated with mitoxantrone admitted on 02/11/2018 with cholecystitis- not a candidate for cholecystectomy. CT scan shows increase in size of hepatic mets despite current chemotherapy. A-fib- anticoagulation now stopped due to drain being placed. Had a drain placed by IR. Palliative medicine consulted for GOC.    Clinical Assessment and Goals of Care:  I have reviewed medical records including EPIC notes, labs and imaging, received report from Dr. Madera, assessed the patient and then met at the bedside along with the patient and his spouse  to discuss diagnosis prognosis, GOC, EOL wishes, disposition and options.  I introduced Palliative Medicine as specialized medical care for people living with serious illness. It focuses on providing relief from the symptoms and stress of a serious illness. The goal is to improve quality of life for both the patient and the family.  As far as functional and nutritional status - he has noted some increasing weakness. Has remained independent with ADL's. Some decreased appetite with his nausea.   We discussed her current illness and what it means in the larger context of her on-going co-morbidities.  Natural disease trajectory and expectations at EOL were discussed. He says he "takes it day by day". We discussed his advancing cancer despite his current chemotherapy. He notes that until his Oncologist tells him, he would continue aggressive therapy- but he does say "I'm tired  of being sick".   The difference between aggressive medical intervention and comfort care was discussed. Hospice services and philosophy of care were discussed. Patient's wife asked several questions about Hospice.   Advanced directives, concepts specific to code status, artifical feeding and hydration, and rehospitalization were considered and discussed. Patient currently has DNR in place.   Questions and concerns were addressed.  Hard Choices booklet left for review. The family was encouraged to call with questions or concerns.   Primary Decision Maker PATIENT    SUMMARY OF RECOMMENDATIONS -Patient awaiting discharge home today -Recommend he follow up with Oncology provider for frank discussion about the progression of his cancer and treatment options    Code Status/Advance Care Planning:  DNR   Palliative Prophylaxis:   Frequent Pain Assessment  Additional Recommendations (Limitations, Scope, Preferences):  Full Scope Treatment  Prognosis:    < 6 months  Discharge Planning: Home with Home Health  Primary Diagnoses: Present on Admission: . Intractable nausea and vomiting . Acute cholecystitis . Prostate cancer metastatic to bone (HCC) . Hypertension . Thyroid disease   I have reviewed the medical record, interviewed the patient and family, and examined the patient. The following aspects are pertinent.  Past Medical History:  Diagnosis   Date  . Diabetes mellitus without complication (HCC)   . Hypertension   . Prostate cancer (HCC) 09/05/2015  . Prostate cancer metastatic to bone (HCC) 09/05/2015  . Sleep apnea   . Thyroid disease    Social History   Socioeconomic History  . Marital status: Married    Spouse name: Not on file  . Number of children: Not on file  . Years of education: Not on file  . Highest education level: Not on file  Occupational History  . Not on file  Social Needs  . Financial resource strain: Not hard at all  . Food insecurity:     Worry: Never true    Inability: Never true  . Transportation needs:    Medical: No    Non-medical: No  Tobacco Use  . Smoking status: Never Smoker  . Smokeless tobacco: Never Used  Substance and Sexual Activity  . Alcohol use: Not Currently    Comment: 1 beer each month  . Drug use: No  . Sexual activity: Never    Comment: married  Lifestyle  . Physical activity:    Days per week: 0 days    Minutes per session: 0 min  . Stress: To some extent  Relationships  . Social connections:    Talks on phone: More than three times a week    Gets together: Three times a week    Attends religious service: 1 to 4 times per year    Active member of club or organization: Yes    Attends meetings of clubs or organizations: 1 to 4 times per year    Relationship status: Married  Other Topics Concern  . Not on file  Social History Narrative  . Not on file   Family History  Problem Relation Age of Onset  . Renal Disease Father   . Vascular Disease Father        Carotid artery disease  . Hypertension Brother    Scheduled Meds: . amoxicillin-clavulanate  1 tablet Oral Q12H  . atenolol  50 mg Oral Daily  . chlorhexidine  15 mL Mouth/Throat BID  . insulin aspart  0-9 Units Subcutaneous TID WC  . insulin detemir  5 Units Subcutaneous QHS  . levothyroxine  25 mcg Oral Q0600  . morphine  15 mg Oral Q12H  . potassium chloride  40 mEq Oral Q4H  . predniSONE  10 mg Oral Q breakfast  . tamsulosin  0.4 mg Oral QPC supper   Continuous Infusions: . lactated ringers 75 mL/hr at 02/16/18 1232   PRN Meds:.HYDROmorphone (DILAUDID) injection, ondansetron (ZOFRAN) IV, phenol, promethazine Medications Prior to Admission:  Prior to Admission medications   Medication Sig Start Date End Date Taking? Authorizing Provider  amoxicillin-clavulanate (AUGMENTIN) 875-125 MG tablet Take 1 tablet by mouth 2 (two) times daily. 02/10/18  Yes Grunz, Ryan B, MD  atenolol (TENORMIN) 100 MG tablet Take 100 mg by mouth  daily.   Yes [provider]  atorvastatin (LIPITOR) 20 MG tablet Take 10 mg by mouth every morning.    Yes [provider]  Calcium Carb-Cholecalciferol (CALCIUM PLUS VITAMIN D3) 600-500 MG-UNIT CAPS Take 1,200 mg by mouth daily.    Yes [provider]  chlorhexidine (PERIDEX) 0.12 % solution Use as directed 15 mLs in the mouth or throat 2 (two) times daily.   Yes [provider]  cyclobenzaprine (FLEXERIL) 10 MG tablet Take 10 mg by mouth 3 (three) times daily as needed for muscle spasms.   Yes   [provider]  diltiazem (CARDIZEM CD) 300 MG 24 hr capsule Take 300 mg by mouth daily.   Yes [provider]  gabapentin (NEURONTIN) 300 MG capsule Patient may take 1 capsule in the morning and 2 capsules @ bedtime. Patient taking differently: Take 300 mg by mouth 2 (two) times daily. Patient may take 1 capsule in the morning and 2 capsules @ bedtime. 10/19/17  Yes Lockamy, Randi L, NP-C  Glucosamine-Chondroit-Vit C-Mn (GLUCOSAMINE 1500 COMPLEX PO) Take 1 tablet by mouth 2 (two) times daily.    Yes [provider]  levothyroxine (SYNTHROID, LEVOTHROID) 25 MCG tablet Take 25 mcg by mouth daily before breakfast.   Yes [provider]  morphine (MS CONTIN) 60 MG 12 hr tablet Take 1 tablet (60 mg total) by mouth every 12 (twelve) hours. 01/18/18  Yes Lockamy, Randi L, NP-C  morphine (MSIR) 30 MG tablet Take 1 tablet (30 mg total) by mouth every 6 (six) hours as needed for severe pain. 01/18/18  Yes Lockamy, Randi L, NP-C  Multiple Vitamin (MULTIVITAMIN WITH MINERALS) TABS tablet Take 1 tablet by mouth daily.   Yes [provider]  Omega-3 Fatty Acids (FISH OIL PO) Take 1 capsule by mouth daily. 03/21/16  Yes [provider]  ondansetron (ZOFRAN-ODT) 8 MG disintegrating tablet Take 1 tablet (8 mg total) by mouth every 8 (eight) hours as needed for nausea or vomiting. 11/30/17  Yes Lockamy, Randi L, NP-C  potassium chloride SA  (KLOR-CON M20) 20 MEQ tablet TAKE 20 MEQ BY MOUTH ONCE DAILY Patient taking differently: Take 20 mEq by mouth daily. TAKE 20 MEQ BY MOUTH ONCE DAILY 11/10/17  Yes Derek Jack, MD  predniSONE (DELTASONE) 5 MG tablet Take 1 tablet (5 mg total) by mouth 2 (two) times daily. Patient taking differently: Take 7.5 mg by mouth daily.  12/24/17  Yes Derek Jack, MD  rivaroxaban (XARELTO) 10 MG TABS tablet Take 1 tablet (10 mg total) by mouth daily. 01/19/18  Yes Lockamy, Randi L, NP-C  tamsulosin (FLOMAX) 0.4 MG CAPS capsule Take 0.8 mg by mouth every morning.    Yes [provider]  triamterene-hydrochlorothiazide (MAXZIDE-25) 37.5-25 MG tablet Take 1 tablet by mouth daily. for high blood pressure 03/25/16  Yes [provider]  vitamin E 400 UNIT capsule Take 800 Units by mouth daily.   Yes [provider]  loperamide (IMODIUM) 1 MG/5ML solution Take 1 mg by mouth as needed for diarrhea or loose stools.    [provider]  prochlorperazine (COMPAZINE) 10 MG tablet Take 1 tablet (10 mg total) by mouth every 6 (six) hours as needed (Nausea or vomiting). Patient taking differently: Take 10 mg by mouth every 6 (six) hours as needed for nausea or vomiting.  01/16/16 08/27/17  Penland, Kelby Fam, MD   No Known Allergies Review of Systems  Constitutional: Positive for activity change, appetite change and fatigue.  Respiratory: Negative for shortness of breath.   Gastrointestinal: Positive for abdominal distention and abdominal pain.    Physical Exam Vitals signs and nursing note reviewed.  Constitutional:      Appearance: He is well-developed.  Abdominal:     General: There is distension.  Skin:    Coloration: Skin is jaundiced.  Neurological:     Mental Status: He is alert.  Psychiatric:        Mood and Affect: Mood normal.        Behavior: Behavior normal.     Vital Signs: BP 119/68 (BP Location: Left  Arm)   Pulse 61   Temp 98.1 F (36.7 C)  (Oral)   Resp 18   Ht 5' 10" (1.778 m)   Wt 92.7 kg   SpO2 98%   BMI 29.32 kg/m  Pain Scale: 0-10   Pain Score: 6    SpO2: SpO2: 98 % O2 Device:SpO2: 98 % O2 Flow Rate: .   IO: Intake/output summary:   Intake/Output Summary (Last 24 hours) at 02/16/2018 1314 Last data filed at 02/16/2018 0500 Gross per 24 hour  Intake 1245.48 ml  Output 40 ml  Net 1205.48 ml    LBM: Last BM Date: 02/15/18 Baseline Weight: Weight: 90.7 kg Most recent weight: Weight: 92.7 kg     Palliative Assessment/Data:   Flowsheet Rows     Most Recent Value  Intake Tab  Referral Department  Hospitalist  Unit at Time of Referral  Med/Surg Unit  Palliative Care Primary Diagnosis  Cancer  Date Notified  02/15/18  Palliative Care Type  New Palliative care  Reason for referral  Clarify Goals of Care  Date of Admission  02/11/18  # of days IP prior to Palliative referral  4  Clinical Assessment  Psychosocial & Spiritual Assessment  Palliative Care Outcomes      Thank you for this consult. Palliative medicine will continue to follow and assist as needed.   Time In: 1115 Time Out: 1230 Time Total: 75 minutes Greater than 50%  of this time was spent counseling and coordinating care related to the above assessment and plan.  Signed by: Kasie Mahan, AGNP-C Palliative Medicine    Please contact Palliative Medicine Team phone at [phone] for questions and concerns.  For individual provider: See Amion               

## 2018-02-16 NOTE — Care Management Note (Signed)
Case Management Note  Patient Details  Name: Micheal Davis MRN: [id number] Date of Birth: 19-Jul-1945  Subjective/Objective:    Admitted for recurrent issues with cholecystitis and required drain to be placed by IR. Pt lives with wife and is ind pta. PT recommends HH PT and pt declines referral at this time. Has met with PMT and is considering hospice services in the future.  Neither pt nor wife communicate any DC needs.              Action/Plan: DC home today with self care.   Expected Discharge Date:                  Expected Discharge Plan:  Home/Self Care  In-House Referral:  NA  Discharge planning Services  CM Consult  Post Acute Care Choice:  NA Choice offered to:  NA  DME Arranged:    DME Agency:     HH Arranged:  Patient Refused [place] Agency:     Status of Service:  Completed, signed off  If discussed at [employer] of Stay Meetings, dates discussed:    Additional Comments:  Sherald Barge, RN 02/16/2018, 3:03 PM

## 2018-02-16 NOTE — Progress Notes (Signed)
Micheal Davis discharged Home per MD order.  Discharge instructions reviewed and discussed with the patient, all questions and concerns answered. Copy of instructions and scripts given to patient. Tought pt and wife how to empty gallbladder drain and change dressing to drain site. Patients skin is clean, dry and intact, no evidence of skin break down. Deaccessed left PAC after heparin flush. Site without signs and symptoms of complications. Dressing and pressure applied.  Patient escorted to car in a wheelchair,  no distress noted upon discharge.  Ralene Muskrat Ahmya Bernick 02/16/2018 7:22 PM

## 2018-02-16 NOTE — Progress Notes (Signed)
[facility] Progress Note     Subjective: Tolerating diet. Pain improved.   Objective: Vital signs in last 24 hours: Temp:  [97.5 F (36.4 C)-98.1 F (36.7 C)] 98.1 F (36.7 C) (12/17 0509) Pulse Rate:  [61-67] 61 (12/17 0509) Resp:  [18] 18 (12/17 0509) BP: (119-146)/(68-86) 119/68 (12/17 0509) SpO2:  [98 %-99 %] 98 % (12/17 0509) Last BM Date: 02/15/18  Intake/Output from previous day: 12/16 0701 - 12/17 0700 In: 1605.5 [P.O.:960; I.V.:592.5; IV Piggyback:53] Out: 40 [Drains:40] Intake/Output this shift: No intake/output data recorded.  General appearance: alert, cooperative and no distress Resp: normal work breathing GI: soft, minimally tender RUQ, drain in place  Lab Results:  Recent Labs    02/14/18 0730  WBC 4.2  HGB 7.8*  HCT 27.0*  PLT 210   BMET Recent Labs    02/14/18 0730 02/16/18 0452  NA 139 139  K 3.4* 2.7*  CL 108 107  CO2 24 25  GLUCOSE 107* 83  BUN 7* 8  CREATININE 0.48* 0.56*  CALCIUM 6.6* 6.6*   Anti-infectives: Anti-infectives (From admission, onward)   Start     Dose/Rate Route Frequency Ordered Stop   02/15/18 2200  amoxicillin-clavulanate (AUGMENTIN) 875-125 MG per tablet 1 tablet     1 tablet Oral Every 12 hours 02/15/18 1656     02/11/18 1745  piperacillin-tazobactam (ZOSYN) IVPB 3.375 g  Status:  Discontinued     3.375 g 12.5 mL/hr over 240 Minutes Intravenous Every 8 hours 02/11/18 1731 02/15/18 1656      Assessment/Plan: Mr. Verbrugge is a 72 yo with acute cholecystitis in the setting of metastastic prostate cancer to the liver s/p IR cholecystostomy tube. Doing fair. Complete oral antibiotic course IR drain care as per IF, flush daily with 5-10cc of saline  Drain clinic follow up  Can follow up with me to verify things going ok Agree with Hospice/ Palliative consult  Will not start back on Xarelto for PE given the Drain    LOS: 5 days    Virl Cagey 02/16/2018

## 2018-02-16 NOTE — Progress Notes (Signed)
Brief note- full consult to follow-   Met with patient and his spouse. Discussed illnesses and prognosis. Patient and spouse have clear expectations. They asked appropriate questions about Hospice services. They are going to discuss options with Oncologist at next appointment. Patient is looking forward to being discharged today.  Mariana Kaufman, AGNP-C Palliative Medicine  Please call Palliative Medicine team phone with any questions [phone]. For individual providers please see AMION.  No charge

## 2018-02-17 ENCOUNTER — Other Ambulatory Visit: Payer: Self-pay | Admitting: General Surgery

## 2018-02-17 DIAGNOSIS — Z515 Encounter for palliative care: Secondary | ICD-10-CM

## 2018-02-17 DIAGNOSIS — K819 Cholecystitis, unspecified: Secondary | ICD-10-CM

## 2018-02-17 DIAGNOSIS — Z7189 Other specified counseling: Secondary | ICD-10-CM

## 2018-02-17 NOTE — Care Management (Signed)
CM received VM from RN that [facility] pt. He now wants HH. CM contacted pt and reviewed [facility] provider options and star rating. Pt chose [facility]. Aware HH has 48 hrs to make first visit. Vaughan Basta, [facility] rep, given referral.

## 2018-02-18 ENCOUNTER — Emergency Department (HOSPITAL_COMMUNITY)
Admission: EM | Admit: 2018-02-18 | Discharge: 2018-02-18 | Disposition: A | Discharge status: Home / Self Care | Payer: Medicare Other | Attending: Emergency Medicine | Admitting: Emergency Medicine

## 2018-02-18 ENCOUNTER — Encounter (HOSPITAL_COMMUNITY): Payer: Self-pay | Admitting: Emergency Medicine

## 2018-02-18 ENCOUNTER — Other Ambulatory Visit: Payer: Self-pay

## 2018-02-18 DIAGNOSIS — I10 Essential (primary) hypertension: Secondary | ICD-10-CM | POA: Diagnosis not present

## 2018-02-18 DIAGNOSIS — G893 Neoplasm related pain (acute) (chronic): Secondary | ICD-10-CM | POA: Diagnosis not present

## 2018-02-18 DIAGNOSIS — Z7952 Long term (current) use of systemic steroids: Secondary | ICD-10-CM | POA: Diagnosis not present

## 2018-02-18 DIAGNOSIS — C61 Malignant neoplasm of prostate: Secondary | ICD-10-CM | POA: Diagnosis not present

## 2018-02-18 DIAGNOSIS — Z8546 Personal history of malignant neoplasm of prostate: Secondary | ICD-10-CM | POA: Insufficient documentation

## 2018-02-18 DIAGNOSIS — K810 Acute cholecystitis: Secondary | ICD-10-CM | POA: Diagnosis not present

## 2018-02-18 DIAGNOSIS — T83090A Other mechanical complication of cystostomy catheter, initial encounter: Secondary | ICD-10-CM | POA: Diagnosis not present

## 2018-02-18 DIAGNOSIS — Z79899 Other long term (current) drug therapy: Secondary | ICD-10-CM | POA: Insufficient documentation

## 2018-02-18 DIAGNOSIS — G4730 Sleep apnea, unspecified: Secondary | ICD-10-CM | POA: Diagnosis not present

## 2018-02-18 DIAGNOSIS — T839XXA Unspecified complication of genitourinary prosthetic device, implant and graft, initial encounter: Secondary | ICD-10-CM

## 2018-02-18 DIAGNOSIS — Z86718 Personal history of other venous thrombosis and embolism: Secondary | ICD-10-CM | POA: Insufficient documentation

## 2018-02-18 DIAGNOSIS — C7951 Secondary malignant neoplasm of bone: Secondary | ICD-10-CM | POA: Diagnosis not present

## 2018-02-18 DIAGNOSIS — T451X5S Adverse effect of antineoplastic and immunosuppressive drugs, sequela: Secondary | ICD-10-CM | POA: Diagnosis not present

## 2018-02-18 DIAGNOSIS — Y829 Unspecified medical devices associated with adverse incidents: Secondary | ICD-10-CM | POA: Insufficient documentation

## 2018-02-18 DIAGNOSIS — C787 Secondary malignant neoplasm of liver and intrahepatic bile duct: Secondary | ICD-10-CM | POA: Diagnosis not present

## 2018-02-18 DIAGNOSIS — Z434 Encounter for attention to other artificial openings of digestive tract: Secondary | ICD-10-CM | POA: Diagnosis not present

## 2018-02-18 DIAGNOSIS — E119 Type 2 diabetes mellitus without complications: Secondary | ICD-10-CM | POA: Diagnosis not present

## 2018-02-18 DIAGNOSIS — Z86711 Personal history of pulmonary embolism: Secondary | ICD-10-CM | POA: Diagnosis not present

## 2018-02-18 DIAGNOSIS — G620 Drug-induced polyneuropathy: Secondary | ICD-10-CM | POA: Diagnosis not present

## 2018-02-18 DIAGNOSIS — Z79891 Long term (current) use of opiate analgesic: Secondary | ICD-10-CM | POA: Diagnosis not present

## 2018-02-18 NOTE — ED Notes (Signed)
This RN placed a call of to IR department. An IR tech will come to check the connection for the drainage bag. EDP notified.

## 2018-02-18 NOTE — ED Provider Notes (Signed)
[facility] EMERGENCY DEPARTMENT Provider Note   CSN: [id number] Arrival date & time: 02/18/18  1405   History   Chief Complaint Chief Complaint  Patient presents with  . Drain Problem    HPI Micheal Davis is a 72 y.o. male.  HPI Patient presents to the emergency room because Micheal Davis is having difficulty disconnecting his drain.  Patient had a percutaneous cholecystotomy placed by interventional radiology on December 15.  Patient states Micheal Davis was told to flush the drain.  Home health with there today to assist him with that and they were unable to unscrew the catheter to flush the drain.  Micheal Davis came to the emergency room for treatment.   Past Medical History:  Diagnosis Date  . Diabetes mellitus without complication ([place])   . Hypertension   . Prostate cancer ([place]) 09/05/2015  . Prostate cancer metastatic to bone ([place]) 09/05/2015  . Sleep apnea   . Thyroid disease     Patient Active Problem List   Diagnosis Date Noted  . Advanced care planning/counseling discussion   . Palliative care by specialist   . Chemotherapy-induced peripheral neuropathy ([place])   . DVT (deep venous thrombosis) ([place])   . Hypokalemia 02/11/2018  . Chronic anemia 02/11/2018  . Physical deconditioning 02/11/2018  . History of pulmonary embolus ([place]) 02/07/2018  . Current chronic use of systemic steroids 02/07/2018  . Prostate cancer metastatic to multiple sites ([place]) 02/07/2018  . Intractable nausea and vomiting 02/07/2018  . Acute cholecystitis   . Subdural hematoma ([place]) 10/07/2017  . Acute pulmonary embolism ([place]) 10/06/2017  . Sleep apnea 10/06/2017  . Hypertension 10/06/2017  . Thyroid disease 10/06/2017  . Diabetes mellitus without complication ([place]) 49/67/5916  . Gastroesophageal reflux disease 03/13/2016  . Prostate cancer metastatic to bone ([place]) 09/05/2015  . Nausea 08/21/2015  . SOB (shortness of breath) on exertion 08/21/2015  . 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 Osteoarthritis of right knee 01/16/2014    Past Surgical History:  Procedure Laterality Date  . CATARACT EXTRACTION    . IR PERC CHOLECYSTOSTOMY  02/14/2018  . PORTACATH PLACEMENT Left 12/31/2015   Procedure: INSERTION PORT-A-CATH LEFT SUBCLAVIAN;  Surgeon: Aviva Signs, MD;  Location: [facility];  Service: General;  Laterality: Left;  . REPLACEMENT TOTAL KNEE Left         Home Medications    Prior to Admission medications   Medication Sig Start Date End Date Taking? Authorizing Provider  amoxicillin-clavulanate (AUGMENTIN) 875-125 MG tablet Take 1 tablet by mouth 2 (two) times daily for 8 days. Do not buy if had at home already; only update days and length of therapy 02/16/18 02/24/18  Barton Dubois, MD  atenolol (TENORMIN) 100 MG tablet Take 100 mg by mouth daily.    [provider]  atorvastatin (LIPITOR) 20 MG tablet Take 10 mg by mouth every morning.     [provider]  Calcium Carb-Cholecalciferol (CALCIUM PLUS VITAMIN D3) 600-500 MG-UNIT CAPS Take 1,200 mg by mouth daily.     [provider]  chlorhexidine (PERIDEX) 0.12 % solution Use as directed 15 mLs in the mouth or throat 2 (two) times daily.    [provider]  cyclobenzaprine (FLEXERIL) 10 MG tablet Take 10 mg by mouth 3 (three) times daily as needed for muscle spasms.    [provider]  diltiazem (CARDIZEM CD) 300 MG 24 hr capsule Take 300 mg by mouth daily.  [provider]  gabapentin (NEURONTIN) 300 MG capsule Take 1 capsule (300 mg total) by mouth 2 (two) times daily. Patient may take 1 capsule in the morning and 2 capsules @ bedtime. 02/16/18   Barton Dubois, MD  Glucosamine-Chondroit-Vit C-Mn (GLUCOSAMINE 1500 COMPLEX PO) Take 1 tablet by mouth 2 (two) times daily.     [provider]  levothyroxine (SYNTHROID, LEVOTHROID) 25 MCG tablet Take 25 mcg by mouth daily before breakfast.    [provider]  loperamide (IMODIUM) 1 MG/5ML solution Take 1 mg by mouth as needed for diarrhea or loose stools.    [provider]  morphine (MS CONTIN) 60 MG 12 hr tablet Take 1 tablet (60 mg total) by mouth every 12 (twelve) hours. 01/18/18   Lockamy, Randi L, NP-C  morphine (MSIR) 30 MG tablet Take 1 tablet (30 mg total) by mouth every 6 (six) hours as needed for severe pain. 01/18/18   Glennie Isle, NP-C  Multiple Vitamin (MULTIVITAMIN WITH MINERALS) TABS tablet Take 1 tablet by mouth daily.    [provider]  Omega-3 Fatty Acids (FISH OIL PO) Take 1 capsule by mouth daily. 03/21/16   [provider]  ondansetron (ZOFRAN-ODT) 8 MG disintegrating tablet Take 1 tablet (8 mg total) by mouth every 8 (eight) hours as needed for nausea or vomiting. 11/30/17   Lockamy, Randi L, NP-C  potassium chloride SA (KLOR-CON M20) 20 MEQ tablet TAKE 20 MEQ BY MOUTH ONCE DAILY Patient taking differently: Take 20 mEq by mouth daily. TAKE 20 MEQ BY MOUTH ONCE DAILY 11/10/17   Derek Jack, MD  predniSONE (DELTASONE) 5 MG tablet Take 1 tablet (5 mg total) by mouth 2 (two) times daily. Patient taking differently: Take 7.5 mg by mouth daily.  12/24/17   Derek Jack, MD  tamsulosin (FLOMAX) 0.4 MG CAPS capsule Take 0.8 mg by mouth every morning.     [provider]  vitamin E 400 UNIT capsule Take 800 Units by mouth daily.    [provider]  prochlorperazine (COMPAZINE) 10 MG tablet Take 1 tablet (10 mg total) by mouth every 6 (six) hours as needed (Nausea or vomiting). Patient taking differently: Take 10 mg by mouth every 6 (six) hours as needed for nausea or vomiting.  01/16/16 08/27/17  Penland, Kelby Fam, MD    Family History Family History  Problem Relation Age of Onset  . Renal Disease Father   . Vascular Disease Father        Carotid artery disease  . Hypertension Brother     Social History Social History   Tobacco Use  . Smoking  status: Never Smoker  . Smokeless tobacco: Never Used  Substance Use Topics  . Alcohol use: Not Currently    Comment: 1 beer each month  . Drug use: No     Allergies   Patient has no known allergies.   Review of Systems Review of Systems  All other systems reviewed and are negative.    Physical Exam Updated Vital Signs BP 107/89   Pulse 63   Temp 98.3 F (36.8 C) (Oral)   Resp 18   SpO2 100%   Physical Exam Vitals signs and nursing note reviewed.  Constitutional:      General: Micheal Davis is not in acute distress.    Appearance: Micheal Davis is well-developed.  HENT:     Head: Normocephalic and atraumatic.     Right Ear: External ear normal.     Left Ear: External ear  normal.  Eyes:     General: No scleral icterus.       Right eye: No discharge.        Left eye: No discharge.     Conjunctiva/sclera: Conjunctivae normal.  Neck:     Musculoskeletal: Neck supple.     Trachea: No tracheal deviation.  Cardiovascular:     Rate and Rhythm: Normal rate.  Pulmonary:     Effort: Pulmonary effort is normal. No respiratory distress.     Breath sounds: No stridor.  Abdominal:     General: There is no distension.     Comments: Drain in place, unable to unscrew the bag connector from the perc drain  Skin:    General: Skin is warm and dry.     Findings: No rash.  Neurological:     Mental Status: Micheal Davis is alert.     Cranial Nerves: Cranial nerve deficit: no gross deficits.      ED Treatments / Results  Labs (all labs ordered are listed, but only abnormal results are displayed) Labs Reviewed - No data to display  EKG None  Radiology No results found.  Procedures Procedures (including critical care time)  Medications Ordered in ED Medications - No data to display   Initial Impression / Assessment and Plan / ED Course  I have reviewed the triage vital signs and the nursing notes.  Pertinent labs & imaging results that were available during my care of the patient were  reviewed by me and considered in my medical decision making (see chart for details).  Clinical Course as of Feb 19 1447  Thu Feb 18, 2018  1423 Will see if someone from IR can take a look at the drain.   [GE]  3662 While the patient was in the ED, rad tech from interventional radiology came down and assisted with removal of the catheter.  Patient now knows how to unscrew the catheter to flush the drain.   [JK]    Clinical Course User Index [JK] Dorie Rank, MD     Final Clinical Impressions(s) / ED Diagnoses   Final diagnoses:  Complication of cystostomy catheter, unspecified complication, initial encounter [facility])    ED Discharge Orders    None       Dorie Rank, MD 02/18/18 1448

## 2018-02-18 NOTE — ED Triage Notes (Signed)
The patient presents with a drain placed cholecystis that cannot be disconnected for use. He reports he and and his home health nurse have attempted but were unsuccessful.

## 2018-02-18 NOTE — ED Notes (Signed)
Drainage tube flushed successfully with 10 CC Flush by IR Rad tech Samuel Jester. No resistance.

## 2018-02-19 DIAGNOSIS — C61 Malignant neoplasm of prostate: Secondary | ICD-10-CM | POA: Diagnosis not present

## 2018-02-19 DIAGNOSIS — E119 Type 2 diabetes mellitus without complications: Secondary | ICD-10-CM | POA: Diagnosis not present

## 2018-02-19 DIAGNOSIS — Z434 Encounter for attention to other artificial openings of digestive tract: Secondary | ICD-10-CM | POA: Diagnosis not present

## 2018-02-19 DIAGNOSIS — C7951 Secondary malignant neoplasm of bone: Secondary | ICD-10-CM | POA: Diagnosis not present

## 2018-02-19 DIAGNOSIS — K810 Acute cholecystitis: Secondary | ICD-10-CM | POA: Diagnosis not present

## 2018-02-19 DIAGNOSIS — C787 Secondary malignant neoplasm of liver and intrahepatic bile duct: Secondary | ICD-10-CM | POA: Diagnosis not present

## 2018-02-22 DIAGNOSIS — C61 Malignant neoplasm of prostate: Secondary | ICD-10-CM | POA: Diagnosis not present

## 2018-02-22 DIAGNOSIS — E119 Type 2 diabetes mellitus without complications: Secondary | ICD-10-CM | POA: Diagnosis not present

## 2018-02-22 DIAGNOSIS — K810 Acute cholecystitis: Secondary | ICD-10-CM | POA: Diagnosis not present

## 2018-02-22 DIAGNOSIS — C7951 Secondary malignant neoplasm of bone: Secondary | ICD-10-CM | POA: Diagnosis not present

## 2018-02-22 DIAGNOSIS — C787 Secondary malignant neoplasm of liver and intrahepatic bile duct: Secondary | ICD-10-CM | POA: Diagnosis not present

## 2018-02-22 DIAGNOSIS — Z434 Encounter for attention to other artificial openings of digestive tract: Secondary | ICD-10-CM | POA: Diagnosis not present

## 2018-02-22 LAB — GLUCOSE, CAPILLARY
Glucose-Capillary: 165 mg/dL — ABNORMAL HIGH (ref 70–99)
Glucose-Capillary: 86 mg/dL (ref 70–99)
Glucose-Capillary: 121 mg/dL — ABNORMAL HIGH (ref 70–99)
Glucose-Capillary: 94 mg/dL (ref 70–99)

## 2018-02-25 DIAGNOSIS — E119 Type 2 diabetes mellitus without complications: Secondary | ICD-10-CM | POA: Diagnosis not present

## 2018-02-25 DIAGNOSIS — C7951 Secondary malignant neoplasm of bone: Secondary | ICD-10-CM | POA: Diagnosis not present

## 2018-02-25 DIAGNOSIS — C61 Malignant neoplasm of prostate: Secondary | ICD-10-CM | POA: Diagnosis not present

## 2018-02-25 DIAGNOSIS — C787 Secondary malignant neoplasm of liver and intrahepatic bile duct: Secondary | ICD-10-CM | POA: Diagnosis not present

## 2018-02-25 DIAGNOSIS — Z434 Encounter for attention to other artificial openings of digestive tract: Secondary | ICD-10-CM | POA: Diagnosis not present

## 2018-02-25 DIAGNOSIS — K810 Acute cholecystitis: Secondary | ICD-10-CM | POA: Diagnosis not present

## 2018-02-26 ENCOUNTER — Other Ambulatory Visit (HOSPITAL_COMMUNITY): Payer: Self-pay | Admitting: *Deleted

## 2018-02-26 DIAGNOSIS — C7951 Secondary malignant neoplasm of bone: Principal | ICD-10-CM

## 2018-02-26 DIAGNOSIS — C61 Malignant neoplasm of prostate: Secondary | ICD-10-CM

## 2018-02-26 MED ORDER — MORPHINE SULFATE ER 60 MG PO TBCR: 60.0000 mg | EXTENDED_RELEASE_TABLET | Freq: Two times a day (BID) | ORAL | 0 refills | Status: DC

## 2018-02-26 MED ORDER — MORPHINE SULFATE ER 60 MG PO TBCR: 60.0000 mg | EXTENDED_RELEASE_TABLET | Freq: Two times a day (BID) | ORAL | 0 refills | Status: AC

## 2018-03-01 DIAGNOSIS — C7951 Secondary malignant neoplasm of bone: Secondary | ICD-10-CM | POA: Diagnosis not present

## 2018-03-01 DIAGNOSIS — Z434 Encounter for attention to other artificial openings of digestive tract: Secondary | ICD-10-CM | POA: Diagnosis not present

## 2018-03-01 DIAGNOSIS — K810 Acute cholecystitis: Secondary | ICD-10-CM | POA: Diagnosis not present

## 2018-03-01 DIAGNOSIS — C787 Secondary malignant neoplasm of liver and intrahepatic bile duct: Secondary | ICD-10-CM | POA: Diagnosis not present

## 2018-03-01 DIAGNOSIS — E119 Type 2 diabetes mellitus without complications: Secondary | ICD-10-CM | POA: Diagnosis not present

## 2018-03-01 DIAGNOSIS — C61 Malignant neoplasm of prostate: Secondary | ICD-10-CM | POA: Diagnosis not present

## 2018-03-04 ENCOUNTER — Inpatient Hospital Stay (HOSPITAL_COMMUNITY): Payer: Medicare Other | Attending: Hematology | Admitting: Hematology

## 2018-03-04 ENCOUNTER — Other Ambulatory Visit: Payer: Self-pay

## 2018-03-04 ENCOUNTER — Encounter (HOSPITAL_COMMUNITY): Payer: Self-pay | Admitting: Hematology

## 2018-03-04 VITALS — BP 117/61 | HR 73 | Temp 97.7°F | Resp 16 | Wt 214.5 lb

## 2018-03-04 DIAGNOSIS — G893 Neoplasm related pain (acute) (chronic): Secondary | ICD-10-CM | POA: Diagnosis not present

## 2018-03-04 DIAGNOSIS — C61 Malignant neoplasm of prostate: Secondary | ICD-10-CM

## 2018-03-04 DIAGNOSIS — I10 Essential (primary) hypertension: Secondary | ICD-10-CM | POA: Insufficient documentation

## 2018-03-04 DIAGNOSIS — C7951 Secondary malignant neoplasm of bone: Secondary | ICD-10-CM | POA: Insufficient documentation

## 2018-03-04 DIAGNOSIS — E079 Disorder of thyroid, unspecified: Secondary | ICD-10-CM | POA: Diagnosis not present

## 2018-03-04 DIAGNOSIS — I2699 Other pulmonary embolism without acute cor pulmonale: Secondary | ICD-10-CM | POA: Insufficient documentation

## 2018-03-04 DIAGNOSIS — E119 Type 2 diabetes mellitus without complications: Secondary | ICD-10-CM

## 2018-03-04 DIAGNOSIS — C787 Secondary malignant neoplasm of liver and intrahepatic bile duct: Secondary | ICD-10-CM | POA: Insufficient documentation

## 2018-03-04 DIAGNOSIS — Z79899 Other long term (current) drug therapy: Secondary | ICD-10-CM | POA: Insufficient documentation

## 2018-03-04 DIAGNOSIS — K810 Acute cholecystitis: Secondary | ICD-10-CM | POA: Diagnosis not present

## 2018-03-04 DIAGNOSIS — Z434 Encounter for attention to other artificial openings of digestive tract: Secondary | ICD-10-CM | POA: Diagnosis not present

## 2018-03-04 MED ORDER — FUROSEMIDE 20 MG PO TABS: 20.0000 mg | ORAL_TABLET | Freq: Every day | ORAL | 1 refills | Status: AC | PRN

## 2018-03-04 NOTE — Patient Instructions (Signed)
[facility] at [facility] Discharge Instructions     Thank you for choosing [facility] at [facility] to provide your oncology and hematology care.  To afford each patient quality time with our provider, please arrive at least 15 minutes before your scheduled appointment time.   If you have a lab appointment with the Cancer Center please come in thru the  Main Entrance and check in at the main information desk  You need to re-schedule your appointment should you arrive 10 or more minutes late.  We strive to give you quality time with our providers, and arriving late affects you and other patients whose appointments are after yours.  Also, if you no show three or more times for appointments you may be dismissed from the clinic at the providers discretion.     Again, thank you for choosing [facility].  Our hope is that these requests will decrease the amount of time that you wait before being seen by our physicians.       _____________________________________________________________  Should you have questions after your visit to [facility], please contact our office at [phone] between the hours of 8:00 a.m. and 4:30 p.m.  Voicemails left after 4:00 p.m. will not be returned until the following business day.  For prescription refill requests, have your pharmacy contact our office and allow 72 hours.    Cancer Center Support Programs:   > Cancer Support Group  2nd Tuesday of the month 1pm-2pm, Journey Room    

## 2018-03-04 NOTE — Assessment & Plan Note (Signed)
1.  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 - BRCA 1/2 and other mutations negative (Myriad), PDL 1 expression of 0% -Foundation 1 testing shows MS-stable, TMB intermediate, KRAS G12D, AKT3 amplification, no other significant alterations to modify therapy. -Most recent progression on cabazitaxel.  He has used [place] in the past.  He had received 2 cycles of docetaxel in the beginning.  It is unclear whether this was discontinued secondary to progression of disease.   -Cycle 1 of docetaxel at 20% dose reduction started on 06/25/2017.  He received cycle 2 on 07/16/2017.  Cycle 3 was on 08/06/2017. -We have reviewed the results of the CT scan and bone scan.  CT scan dated 08/25/2017 was compared to scans done on 05/06/2017.  There was addition of 2 new small lesions in the liver.  The dominant lesion appears bigger due to necrosis.  As there was 6 to 8 weeks delay in starting his first cycle of chemotherapy from the scans, these lesions could have grown during that time.  However his PSA has come down from 196 to161 at cycle 3.  He received cycle 4 on 08/27/2017.  Last treatment was cycle 5 on 09/17/2017. - We discussed the results of the CT scan of the abdomen and pelvis dated 10/06/2017 which showed similar appearance of scattered liver metastatic lesions.  Although unchanged in size, dominant lesion in segment 4 of the liver does demonstrate some increased central necrosis compared to previous.  Diffuse skeletal metastasis are stable. - Completed cycle 8 on 11/30/2017. -A CT scan on 12/18/2017 showed enlarging liver mets.  Bone scan also showed worsening bone mets.  PSA on 12/22/2017 increased to 319. -2D echo on 12/24/2017 shows EF of 55 to 60%.   - He received 2 cycles of mitoxantrone from 12/28/2017 through 01/18/2018. - He was hospitalized from 02/11/2018 through 02/16/2018 with intractable nausea and vomiting.  He was diagnosed  with acute cholecystitis and underwent a percutaneous cholecystostomy. - We reviewed the results of the CT scan dated 02/07/2018 which showed hepatic metastatic disease worsening with largest metastasis measuring 9.1 x 9.7 cm, previously 5.1 x 5.5 cm.  Increased in number of hepatic metastatic sites. - I do not believe he is a candidate for any further systemic therapy.  I have recommended hospice evaluation at this time.  Patient is agreeable. - I went over his medication list and discontinued some of the chronic medications. -He will also taper his prednisone over the next 20 days.  2.  Cancer related pain: - He will continue morphine ER 60 mg every 12 hours. -He is also taking morphine IR 30 mg 3 to 4 hours daily.  3.  Incidental pulmonary embolism: - Diagnosed on a CT scan of the abdomen and pelvis on 10/06/2017 done for follow-up of prostate cancer, showing right lower lobe pulmonary embolus.  CT evidence of right heart strain. -He was on Xarelto until recent hospitalization.  This was discontinued after the percutaneous cholecystostomy.  He is currently on aspirin 81 mg daily.  4.  Bone metastasis: - We will hold off on further denosumab as he is enrolling in hospice.

## 2018-03-04 NOTE — Progress Notes (Signed)
[address] [place], [place] [address]   CLINIC:  Medical Oncology/Hematology  PCP:  [name], MD [place] [address] [phone]   REASON FOR VISIT: Follow-up for metastatic prostate cancer to the bones  CURRENT THERAPY: Mitoxantrone every 3 weeks  BRIEF ONCOLOGIC HISTORY:    Prostate cancer metastatic to bone ([place])   10/01/2012 Initial Diagnosis    Prostate biopsied with highest Gleason score of 9 seen and the lowest score was 7.    10/04/2012 - 05/16/2013 Chemotherapy    Depo-Lupron and Casodex initiated    05/16/2013 -  Chemotherapy    Depo-Lupron monthly continued    05/16/2013 Progression    Progression by PSA elevation    05/16/2013 - 10/22/2014 Chemotherapy    Abiraterone and prednisone initiated in conjunction with ongoing Depo-Lupron.  Denosumab also ongoing.    10/23/2014 Progression    PSA increasing from 0.2- 1.6 in less than 6 months.      10/23/2014 - 01/30/2015 Chemotherapy    Enzalutamide and Prednisone (5 mg in AM and 2.5 mg in PM)    01/30/2015 Imaging    Bone scan- New focus of intense activity in right proximal humerus.  Interim increase in activity over left hip.    01/30/2015 Progression    Bone scan reveals new disease in right humerus consistent with progression of disease    01/31/2015 Imaging    Right humerus xray- blastic foci in proximal right humeral metaphysis and over right mid-humeral diaphysis.  No evidence of fracture    07/06/2015 Progression    Progression in multiple bones especially L hip and femurs    07/06/2015 Imaging    Bone scan- progressive multifocal osseous metastases in the right proximal femora and distal femoral shafts.  Stable update in bilateral ribs suspicious for small rib metastases    07/16/2015 - 07/31/2015 Radiation Therapy    Left femur 30 Gy in 10 fractions by Dr. Tammi Klippel    11/23/2015 - 01/04/2016 Chemotherapy    The patient had pegfilgrastim (NEULASTA ONPRO KIT)  injection 6 mg, 6 mg, Subcutaneous, Once, 3 of 7 cycles  DOCEtaxel (TAXOTERE) 180 mg in dextrose 5 % 250 mL chemo infusion, 75 mg/m2 = 180 mg, Intravenous,  Once, 3 of 7 cycles Dose modification: 64 mg/m2 (original dose 75 mg/m2, Cycle 2, Reason: Dose not tolerated)  pegfilgrastim (NEULASTA ONPRO KIT) injection 6 mg, 6 mg, Subcutaneous, Once, 0 of 4 cycles  cabazitaxel (JEVTANA) 60 mg in dextrose 5 % 250 mL chemo infusion, 25 mg/m2, Intravenous,  Once, 0 of 4 cycles  for chemotherapy treatment.      11/30/2015 Adverse Reaction    Diarrhea (secondary to chemotherapy) and dehydration requiring IV fluids    12/14/2015 Treatment Plan Change    Docetaxel dose reduced by 15%    12/31/2015 Procedure    Port placed by Dr. Arnoldo Morale    01/04/2016 Progression    Rising PSA    01/28/2016 - 04/29/2017 Chemotherapy    Cabazitaxel (Jevtana)     03/27/2016 Imaging    CT Chest, Abdomen, and Pelvis with contrast 1. Diffuse sclerotic osseous metastatic disease in the chest, abdomen, and pelvis without acute fracture identified. 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 These appear chronic. 2. The prostate gland is normal in size and no adenopathy is currently identified. 3. On a prior MRI of 10/04/2012, there was a posterior right hepatic lobe lesion. This lesion is not  currently visible on today' s CT. This could be due to differences in cons acuity between CT or MRI, or resolution of the lesion. 4. Coronary, aortic arch, and branch vessel atherosclerotic vascular disease. Aortoiliac atherosclerotic vascular disease. 5. Single bilateral renal cysts.    04/16/2016 Imaging    Bone scan- Findings consist with progressive metastatic disease. Activity over the proximal right humerus and proximal bilateral femurs are worrisome for the possible development of pathologic fractures.    08/21/2016 Imaging    CT C/A/P: IMPRESSION: No significant change since 03/27/2016  CT. Diffuse bony metastases without other evidence of metastatic disease.    08/21/2016 Imaging    Bone Scan: IMPRESSION: Multiple areas of increased activity again noted throughout the axial and appendicular skeleton in similar locations as prior exam. Intensity of uptake is increased from prior exam suggesting progressive disease. Lesions present in the proximal humeri, proximal femurs, and the mid right femur susceptible to pathologic fracture.    12/02/2016 Imaging    CT C/A/P: IMPRESSION: 1. Overall stable appearance of diffuse osseous metastatic disease and resulting patchy sclerosis. 2. The patient had a posterior right hepatic lobe lesion on prior MRI from 2014 which is been relatively occult on CT. Given the lack of progression I suspect that this is benign or has been effectively treated. 3. Aortic Atherosclerosis (ICD10-I70.0). Coronary atherosclerosis with mild cardiomegaly. 4. Cholelithiasis. 5. Bilateral benign renal cysts. 6. 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    12/02/2016 Imaging    Bone Scan: IMPRESSION: 1. Widespread osseous metastatic disease. Multiplicity is similar to previous exam with interval increase in degree of tracer uptake associated with multiple lesions.    03/11/2017 Imaging    Bone Scan: Multifocal skeletal disease without sign of progression CT C/A/P: Diffuse sclerotic skeletal lesions, unchanged from the previous.  No new lymphadenopathy, pulmonary nodules, or hepatic lesions to suggest soft tissue progressive disease    06/25/2017 -  Chemotherapy    Docetaxel    12/22/2017 -  Chemotherapy    The patient had pegfilgrastim (NEULASTA ONPRO KIT) injection 6 mg, 6 mg, Subcutaneous, Once, 2 of 6 cycles Administration: 6 mg (12/28/2017), 6 mg (01/18/2018) ondansetron  (ZOFRAN) 8 mg in sodium chloride 0.9 % 50 mL IVPB, 8 mg (100 % of original dose 8 mg), Intravenous,  Once, 1 of 1 cycle Dose modification: 8 mg (original dose 8 mg, Cycle 1) ondansetron (ZOFRAN) 8 mg, dexamethasone (DECADRON) 10 mg in sodium chloride 0.9 % 50 mL IVPB, , Intravenous,  Once, 2 of 6 cycles Administration: 8 mg (12/28/2017), 8 mg (01/18/2018) mitoXANtrone (NOVANTRONE) 26 mg in sodium chloride 0.9 % 50 mL chemo infusion, 12 mg/m2 = 26 mg, Intravenous,  Once, 2 of 6 cycles Administration: 26 mg (12/28/2017), 26 mg (01/18/2018)  for chemotherapy treatment.       CANCER STAGING: Cancer Staging Prostate cancer metastatic to bone [facility]) Staging form: Prostate, AJCC 7th Edition - Clinical: No stage assigned - Unsigned    INTERVAL HISTORY:  Mr. Micheal Davis 73 y.o. 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 Patient is here today with his wife. He is here today and more fatigued and weak than normal. He is SOB with rest. He also has occasional nausea. He is not very active at home and sleeps the majority of the day. Denies any vomiting or diarrhea.  Denies any new pains. Had not noticed any recent bleeding such as epistaxis, hematuria or hematochezia. Denies recent chest pain on exertion, pre-syncopal episodes, or palpitations. Denies any numbness or tingling in hands or feet. Denies any recent fevers, infections, or recent hospitalizations. He reports his appetite at 100% and his energy level at 50%.    REVIEW OF SYSTEMS:  Review of Systems  Constitutional: Positive for fatigue.  Respiratory: Positive for shortness of breath.   Gastrointestinal: Positive for nausea.  Neurological: Positive for extremity weakness.  All other systems reviewed and are negative.    PAST MEDICAL/SURGICAL HISTORY:  Past Medical History:  Diagnosis Date  . Diabetes mellitus without complication ([place])   . Hypertension   . Prostate cancer ([place]) 09/05/2015  . Prostate  cancer metastatic to bone ([place]) 09/05/2015  . Sleep apnea   . Thyroid disease    Past Surgical History:  Procedure Laterality Date  . CATARACT EXTRACTION    . IR PERC CHOLECYSTOSTOMY  02/14/2018  . PORTACATH PLACEMENT Left 12/31/2015   Procedure: INSERTION PORT-A-CATH LEFT SUBCLAVIAN;  Surgeon: Aviva Signs, MD;  Location: [facility];  Service: General;  Laterality: Left;  . REPLACEMENT TOTAL KNEE Left      SOCIAL HISTORY:  Social History   Socioeconomic History  . Marital status: Married    Spouse name: Not on file  . Number of children: Not on file  . Years of education: Not on file  . Highest education level: Not on file  Occupational History  . Not on file  Social Needs  . Financial resource strain: Not hard at all  . Food insecurity:    Worry: Never true    Inability: Never true  . Transportation needs:    Medical: No    Non-medical: No  Tobacco Use  . Smoking status: Never Smoker  . Smokeless tobacco: Never Used  Substance and Sexual Activity  . Alcohol use: Not Currently    Comment: 1 beer each month  . Drug use: No  . Sexual activity: Never    Comment: married  Lifestyle  . Physical activity:    Days per week: 0 days    Minutes per session: 0 min  . Stress: To some extent  Relationships  . Social connections:    Talks on phone: More than three times a week    Gets together: Three times a week    Attends religious service: 1 to 4 times per year    Active member of club or organization: Yes    Attends meetings of clubs or organizations: 1 to 4 times per year    Relationship status: Married  . Intimate partner violence:    Fear of current or ex partner: Patient refused    Emotionally abused: Patient refused    Physically abused: Patient refused    Forced sexual activity: Patient refused  Other Topics Concern  . Not on file  Social History Narrative  . Not on file    FAMILY HISTORY:  Family History  Problem Relation Age of Onset  . Renal Disease  Father   . Vascular Disease Father        Carotid artery disease  . Hypertension Brother     CURRENT MEDICATIONS:  Outpatient Encounter Medications as of 03/04/2018  Medication Sig Note  . atenolol (TENORMIN) 100 MG tablet Take 100 mg by mouth daily.   Marland Kitchen atorvastatin (LIPITOR) 20 MG tablet Take 10 mg by mouth every morning.    . Calcium Carb-Cholecalciferol (CALCIUM  PLUS VITAMIN D3) 600-500 MG-UNIT CAPS Take 1,200 mg by mouth daily.    Marland Kitchen gabapentin (NEURONTIN) 300 MG capsule Take 1 capsule (300 mg total) by mouth 2 (two) times daily. Patient may take 1 capsule in the morning and 2 capsules @ bedtime.   . Glucosamine-Chondroit-Vit C-Mn (GLUCOSAMINE 1500 COMPLEX PO) Take 1 tablet by mouth 2 (two) times daily.    Marland Kitchen levothyroxine (SYNTHROID, LEVOTHROID) 25 MCG tablet Take 25 mcg by mouth daily before breakfast.   . loperamide (IMODIUM) 1 MG/5ML solution Take 1 mg by mouth as needed for diarrhea or loose stools.   Marland Kitchen morphine (MS CONTIN) 60 MG 12 hr tablet Take 1 tablet (60 mg total) by mouth every 12 (twelve) hours.   Marland Kitchen morphine (MSIR) 30 MG tablet Take 1 tablet (30 mg total) by mouth every 6 (six) hours as needed for severe pain.   . Multiple Vitamin (MULTIVITAMIN WITH MINERALS) TABS tablet Take 1 tablet by mouth daily.   . Omega-3 Fatty Acids (FISH OIL PO) Take 1 capsule by mouth daily.   . potassium chloride SA (KLOR-CON M20) 20 MEQ tablet TAKE 20 MEQ BY MOUTH ONCE DAILY (Patient taking differently: Take 20 mEq by mouth daily. TAKE 20 MEQ BY MOUTH ONCE DAILY)   . predniSONE (DELTASONE) 5 MG tablet Take 1 tablet (5 mg total) by mouth 2 (two) times daily. (Patient taking differently: Take 7.5 mg by mouth daily. )   . tamsulosin (FLOMAX) 0.4 MG CAPS capsule Take 0.8 mg by mouth every morning.    . vitamin E 400 UNIT capsule Take 800 Units by mouth daily.   . chlorhexidine (PERIDEX) 0.12 % solution Use as directed 15 mLs in the mouth or throat 2 (two) times daily.   Marland Kitchen diltiazem (CARDIZEM CD) 300 MG  24 hr capsule Take 300 mg by mouth daily. 03/04/2018: Patient states he does not know if he takes this drug or not.   . furosemide (LASIX) 20 MG tablet Take 1 tablet (20 mg total) by mouth daily as needed.   . ondansetron (ZOFRAN-ODT) 8 MG disintegrating tablet Take 1 tablet (8 mg total) by mouth every 8 (eight) hours as needed for nausea or vomiting. (Patient not taking: Reported on 03/04/2018)   . [DISCONTINUED] cyclobenzaprine (FLEXERIL) 10 MG tablet Take 10 mg by mouth 3 (three) times daily as needed for muscle spasms.   . [DISCONTINUED] prochlorperazine (COMPAZINE) 10 MG tablet Take 1 tablet (10 mg total) by mouth every 6 (six) hours as needed (Nausea or vomiting). (Patient taking differently: Take 10 mg by mouth every 6 (six) hours as needed for nausea or vomiting. )    Facility-Administered Encounter Medications as of 03/04/2018  Medication  . leuprolide (LUPRON) injection 7.5 mg    ALLERGIES:  No Known Allergies   PHYSICAL EXAM:  ECOG Performance status: 1  Vitals:   03/04/18 0800  BP: 117/61  Pulse: 73  Resp: 16  Temp: 97.7 F (36.5 C)  SpO2: 100%   Filed Weights   03/04/18 0800  Weight: 214 lb 8 oz (97.3 kg)    Physical Exam Constitutional:      Appearance: Normal appearance. He is normal weight.  Musculoskeletal:     Right lower leg: Edema present.     Left lower leg: Edema present.  Skin:    General: Skin is warm and dry.  Neurological:     General: No focal deficit present.     Mental Status: He is alert and oriented to person, place, and  time. Mental status is at baseline.  Psychiatric:        Mood and Affect: Mood normal.        Behavior: Behavior normal.        Thought Content: Thought content normal.        Judgment: Judgment normal.      LABORATORY DATA:  I have reviewed the labs as listed.  CBC    Component Value Date/Time   WBC 4.2 02/14/2018 0730   RBC 2.89 (L) 02/14/2018 0730   HGB 7.8 (L) 02/14/2018 0730   HCT 27.0 (L) 02/14/2018 0730    PLT 210 02/14/2018 0730   MCV 93.4 02/14/2018 0730   MCH 27.0 02/14/2018 0730   MCHC 28.9 (L) 02/14/2018 0730   RDW 19.3 (H) 02/14/2018 0730   LYMPHSABS 0.5 (L) 02/14/2018 0730   MONOABS 0.3 02/14/2018 0730   EOSABS 0.0 02/14/2018 0730   BASOSABS 0.0 02/14/2018 0730   CMP Latest Ref Rng & Units 02/16/2018 02/16/2018 02/14/2018  Glucose 70 - 99 mg/dL 181(H) 83 107(H)  BUN 8 - 23 mg/dL 7(L) 8 7(L)  Creatinine 0.61 - 1.24 mg/dL 0.53(L) 0.56(L) 0.48(L)  Sodium 135 - 145 mmol/L 138 139 139  Potassium 3.5 - 5.1 mmol/L 3.5 2.7(LL) 3.4(L)  Chloride 98 - 111 mmol/L 108 107 108  CO2 22 - 32 mmol/L '23 25 24  ' Calcium 8.9 - 10.3 mg/dL 6.7(L) 6.6(L) 6.6(L)  Total Protein 6.5 - 8.1 g/dL - - -  Total Bilirubin 0.3 - 1.2 mg/dL - - -  Alkaline Phos 38 - 126 U/L - - -  AST 15 - 41 U/L - - -  ALT 0 - 44 U/L - - -       DIAGNOSTIC IMAGING:  I have independently reviewed the scans and discussed with the patient.   I have reviewed Francene Finders, NP's note and agree with the documentation.  I personally performed a face-to-face visit, made revisions and my assessment and plan is as follows.    ASSESSMENT & PLAN:   Prostate cancer metastatic to bone ([place]) 1.  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 - BRCA 1/2 and other mutations negative (Myriad), PDL 1 expression of 0% -Foundation 1 testing shows MS-stable, TMB intermediate, KRAS G12D, AKT3 amplification, no other significant alterations to modify therapy. -Most recent progression on cabazitaxel.  He has used [place] in the past.  He had received 2 cycles of docetaxel in the beginning.  It is unclear whether this was discontinued secondary to progression of disease.   -Cycle 1 of docetaxel at 20% dose reduction started on 06/25/2017.  He received cycle 2 on 07/16/2017.  Cycle 3 was on 08/06/2017. -We have reviewed the results of the CT scan and bone scan.  CT scan  dated 08/25/2017 was compared to scans done on 05/06/2017.  There was addition of 2 new small lesions in the liver.  The dominant lesion appears bigger due to necrosis.  As there was 6 to 8 weeks delay in starting his first cycle of chemotherapy from the scans, these lesions could have grown during that time.  However his PSA has come down from 196 to161 at cycle 3.  He received cycle 4 on 08/27/2017.  Last treatment was cycle 5 on 09/17/2017. - We discussed the results of the CT scan of the abdomen and pelvis dated 10/06/2017 which showed similar appearance of scattered liver metastatic lesions.  Although unchanged in size,  dominant lesion in segment 4 of the liver does demonstrate some increased central necrosis compared to previous.  Diffuse skeletal metastasis are stable. - Completed cycle 8 on 11/30/2017. -A CT scan on 12/18/2017 showed enlarging liver mets.  Bone scan also showed worsening bone mets.  PSA on 12/22/2017 increased to 319. -2D echo on 12/24/2017 shows EF of 55 to 60%.   - He received 2 cycles of mitoxantrone from 12/28/2017 through 01/18/2018. - He was hospitalized from 02/11/2018 through 02/16/2018 with intractable nausea and vomiting.  He was diagnosed with acute cholecystitis and underwent a percutaneous cholecystostomy. - We reviewed the results of the CT scan dated 02/07/2018 which showed hepatic metastatic disease worsening with largest metastasis measuring 9.1 x 9.7 cm, previously 5.1 x 5.5 cm.  Increased in number of hepatic metastatic sites. - I do not believe he is a candidate for any further systemic therapy.  I have recommended hospice evaluation at this time.  Patient is agreeable. - I went over his medication list and discontinued some of the chronic medications. -He will also taper his prednisone over the next 20 days.  2.  Cancer related pain: - He will continue morphine ER 60 mg every 12 hours. -He is also taking morphine IR 30 mg 3 to 4 hours daily.  3.  Incidental  pulmonary embolism: - Diagnosed on a CT scan of the abdomen and pelvis on 10/06/2017 done for follow-up of prostate cancer, showing right lower lobe pulmonary embolus.  CT evidence of right heart strain. -He was on Xarelto until recent hospitalization.  This was discontinued after the percutaneous cholecystostomy.  He is currently on aspirin 81 mg daily.  4.  Bone metastasis: - We will hold off on further denosumab as he is enrolling in hospice.       Orders placed this encounter:  Orders Placed This Encounter  Procedures  . Ambulatory referral to [place], [place] [phone]

## 2018-03-05 DIAGNOSIS — H04129 Dry eye syndrome of unspecified lacrimal gland: Secondary | ICD-10-CM | POA: Diagnosis not present

## 2018-03-05 DIAGNOSIS — R682 Dry mouth, unspecified: Secondary | ICD-10-CM | POA: Diagnosis not present

## 2018-03-05 DIAGNOSIS — C61 Malignant neoplasm of prostate: Secondary | ICD-10-CM | POA: Diagnosis not present

## 2018-03-05 DIAGNOSIS — I10 Essential (primary) hypertension: Secondary | ICD-10-CM | POA: Diagnosis not present

## 2018-03-05 DIAGNOSIS — E118 Type 2 diabetes mellitus with unspecified complications: Secondary | ICD-10-CM | POA: Diagnosis not present

## 2018-03-05 DIAGNOSIS — E039 Hypothyroidism, unspecified: Secondary | ICD-10-CM | POA: Diagnosis not present

## 2018-03-05 DIAGNOSIS — F419 Anxiety disorder, unspecified: Secondary | ICD-10-CM | POA: Diagnosis not present

## 2018-03-05 DIAGNOSIS — R52 Pain, unspecified: Secondary | ICD-10-CM | POA: Diagnosis not present

## 2018-03-05 DIAGNOSIS — R531 Weakness: Secondary | ICD-10-CM | POA: Diagnosis not present

## 2018-03-05 DIAGNOSIS — R0989 Other specified symptoms and signs involving the circulatory and respiratory systems: Secondary | ICD-10-CM | POA: Diagnosis not present

## 2018-03-08 DIAGNOSIS — R531 Weakness: Secondary | ICD-10-CM | POA: Diagnosis not present

## 2018-03-08 DIAGNOSIS — E118 Type 2 diabetes mellitus with unspecified complications: Secondary | ICD-10-CM | POA: Diagnosis not present

## 2018-03-08 DIAGNOSIS — E039 Hypothyroidism, unspecified: Secondary | ICD-10-CM | POA: Diagnosis not present

## 2018-03-08 DIAGNOSIS — I10 Essential (primary) hypertension: Secondary | ICD-10-CM | POA: Diagnosis not present

## 2018-03-08 DIAGNOSIS — C61 Malignant neoplasm of prostate: Secondary | ICD-10-CM | POA: Diagnosis not present

## 2018-03-08 DIAGNOSIS — R52 Pain, unspecified: Secondary | ICD-10-CM | POA: Diagnosis not present

## 2018-03-10 DIAGNOSIS — C61 Malignant neoplasm of prostate: Secondary | ICD-10-CM | POA: Diagnosis not present

## 2018-03-10 DIAGNOSIS — R52 Pain, unspecified: Secondary | ICD-10-CM | POA: Diagnosis not present

## 2018-03-10 DIAGNOSIS — E118 Type 2 diabetes mellitus with unspecified complications: Secondary | ICD-10-CM | POA: Diagnosis not present

## 2018-03-10 DIAGNOSIS — I10 Essential (primary) hypertension: Secondary | ICD-10-CM | POA: Diagnosis not present

## 2018-03-10 DIAGNOSIS — R531 Weakness: Secondary | ICD-10-CM | POA: Diagnosis not present

## 2018-03-10 DIAGNOSIS — E039 Hypothyroidism, unspecified: Secondary | ICD-10-CM | POA: Diagnosis not present

## 2018-03-12 DIAGNOSIS — R52 Pain, unspecified: Secondary | ICD-10-CM | POA: Diagnosis not present

## 2018-03-12 DIAGNOSIS — E118 Type 2 diabetes mellitus with unspecified complications: Secondary | ICD-10-CM | POA: Diagnosis not present

## 2018-03-12 DIAGNOSIS — E039 Hypothyroidism, unspecified: Secondary | ICD-10-CM | POA: Diagnosis not present

## 2018-03-12 DIAGNOSIS — R531 Weakness: Secondary | ICD-10-CM | POA: Diagnosis not present

## 2018-03-12 DIAGNOSIS — C61 Malignant neoplasm of prostate: Secondary | ICD-10-CM | POA: Diagnosis not present

## 2018-03-12 DIAGNOSIS — I10 Essential (primary) hypertension: Secondary | ICD-10-CM | POA: Diagnosis not present

## 2018-03-15 DIAGNOSIS — E118 Type 2 diabetes mellitus with unspecified complications: Secondary | ICD-10-CM | POA: Diagnosis not present

## 2018-03-15 DIAGNOSIS — R531 Weakness: Secondary | ICD-10-CM | POA: Diagnosis not present

## 2018-03-15 DIAGNOSIS — E039 Hypothyroidism, unspecified: Secondary | ICD-10-CM | POA: Diagnosis not present

## 2018-03-15 DIAGNOSIS — R52 Pain, unspecified: Secondary | ICD-10-CM | POA: Diagnosis not present

## 2018-03-15 DIAGNOSIS — C61 Malignant neoplasm of prostate: Secondary | ICD-10-CM | POA: Diagnosis not present

## 2018-03-15 DIAGNOSIS — I10 Essential (primary) hypertension: Secondary | ICD-10-CM | POA: Diagnosis not present

## 2018-03-17 DIAGNOSIS — I10 Essential (primary) hypertension: Secondary | ICD-10-CM | POA: Diagnosis not present

## 2018-03-17 DIAGNOSIS — E118 Type 2 diabetes mellitus with unspecified complications: Secondary | ICD-10-CM | POA: Diagnosis not present

## 2018-03-17 DIAGNOSIS — E039 Hypothyroidism, unspecified: Secondary | ICD-10-CM | POA: Diagnosis not present

## 2018-03-17 DIAGNOSIS — R52 Pain, unspecified: Secondary | ICD-10-CM | POA: Diagnosis not present

## 2018-03-17 DIAGNOSIS — C61 Malignant neoplasm of prostate: Secondary | ICD-10-CM | POA: Diagnosis not present

## 2018-03-17 DIAGNOSIS — R531 Weakness: Secondary | ICD-10-CM | POA: Diagnosis not present

## 2018-03-18 DIAGNOSIS — E118 Type 2 diabetes mellitus with unspecified complications: Secondary | ICD-10-CM | POA: Diagnosis not present

## 2018-03-18 DIAGNOSIS — E039 Hypothyroidism, unspecified: Secondary | ICD-10-CM | POA: Diagnosis not present

## 2018-03-18 DIAGNOSIS — R52 Pain, unspecified: Secondary | ICD-10-CM | POA: Diagnosis not present

## 2018-03-18 DIAGNOSIS — I10 Essential (primary) hypertension: Secondary | ICD-10-CM | POA: Diagnosis not present

## 2018-03-18 DIAGNOSIS — R531 Weakness: Secondary | ICD-10-CM | POA: Diagnosis not present

## 2018-03-18 DIAGNOSIS — C61 Malignant neoplasm of prostate: Secondary | ICD-10-CM | POA: Diagnosis not present

## 2018-03-19 DIAGNOSIS — I10 Essential (primary) hypertension: Secondary | ICD-10-CM | POA: Diagnosis not present

## 2018-03-19 DIAGNOSIS — C61 Malignant neoplasm of prostate: Secondary | ICD-10-CM | POA: Diagnosis not present

## 2018-03-19 DIAGNOSIS — R52 Pain, unspecified: Secondary | ICD-10-CM | POA: Diagnosis not present

## 2018-03-19 DIAGNOSIS — E118 Type 2 diabetes mellitus with unspecified complications: Secondary | ICD-10-CM | POA: Diagnosis not present

## 2018-03-19 DIAGNOSIS — E039 Hypothyroidism, unspecified: Secondary | ICD-10-CM | POA: Diagnosis not present

## 2018-03-19 DIAGNOSIS — R531 Weakness: Secondary | ICD-10-CM | POA: Diagnosis not present

## 2018-03-21 DIAGNOSIS — I10 Essential (primary) hypertension: Secondary | ICD-10-CM | POA: Diagnosis not present

## 2018-03-21 DIAGNOSIS — E118 Type 2 diabetes mellitus with unspecified complications: Secondary | ICD-10-CM | POA: Diagnosis not present

## 2018-03-21 DIAGNOSIS — C61 Malignant neoplasm of prostate: Secondary | ICD-10-CM | POA: Diagnosis not present

## 2018-03-21 DIAGNOSIS — R52 Pain, unspecified: Secondary | ICD-10-CM | POA: Diagnosis not present

## 2018-03-21 DIAGNOSIS — R531 Weakness: Secondary | ICD-10-CM | POA: Diagnosis not present

## 2018-03-21 DIAGNOSIS — E039 Hypothyroidism, unspecified: Secondary | ICD-10-CM | POA: Diagnosis not present

## 2018-03-22 DIAGNOSIS — R531 Weakness: Secondary | ICD-10-CM | POA: Diagnosis not present

## 2018-03-22 DIAGNOSIS — I10 Essential (primary) hypertension: Secondary | ICD-10-CM | POA: Diagnosis not present

## 2018-03-22 DIAGNOSIS — E118 Type 2 diabetes mellitus with unspecified complications: Secondary | ICD-10-CM | POA: Diagnosis not present

## 2018-03-22 DIAGNOSIS — E039 Hypothyroidism, unspecified: Secondary | ICD-10-CM | POA: Diagnosis not present

## 2018-03-22 DIAGNOSIS — R52 Pain, unspecified: Secondary | ICD-10-CM | POA: Diagnosis not present

## 2018-03-22 DIAGNOSIS — C61 Malignant neoplasm of prostate: Secondary | ICD-10-CM | POA: Diagnosis not present

## 2018-03-30 ENCOUNTER — Other Ambulatory Visit: Payer: Medicare Other

## 2018-04-03 DEATH — deceased

## 2018-09-21 ENCOUNTER — Telehealth (HOSPITAL_COMMUNITY): Payer: Self-pay | Admitting: Hematology

## 2018-09-21 NOTE — Telephone Encounter (Signed)
Entered chart to print off itemized statements for pts wife to submit to cancer policy.

## 2019-03-10 IMAGING — US US BIOPSY CORE LIVER
1 series · 11 of 11 positions shown · non-contrast
Comparison: none

INDICATION: 71-year-old with history of prostate cancer and new suspicious liver
lesions. Request for liver lesion biopsy.

[Series 1: us biopsy core liver · 0.22mm/px · 11 of 11 slices shown]
[im 1/11]
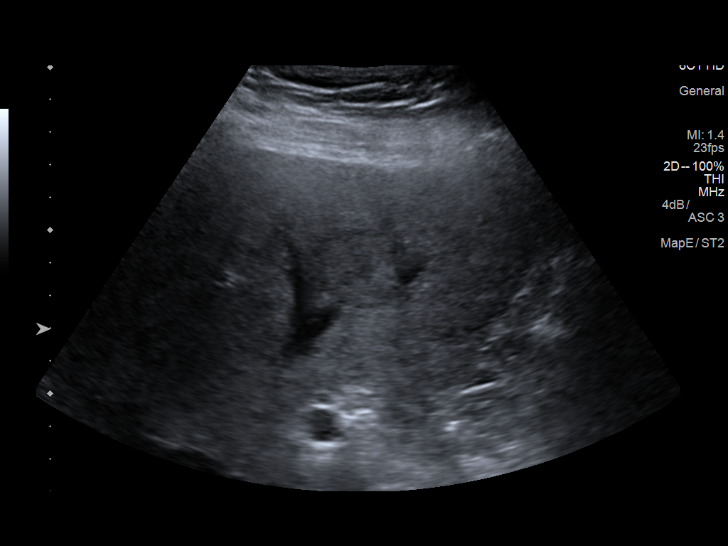
[im 2/11]
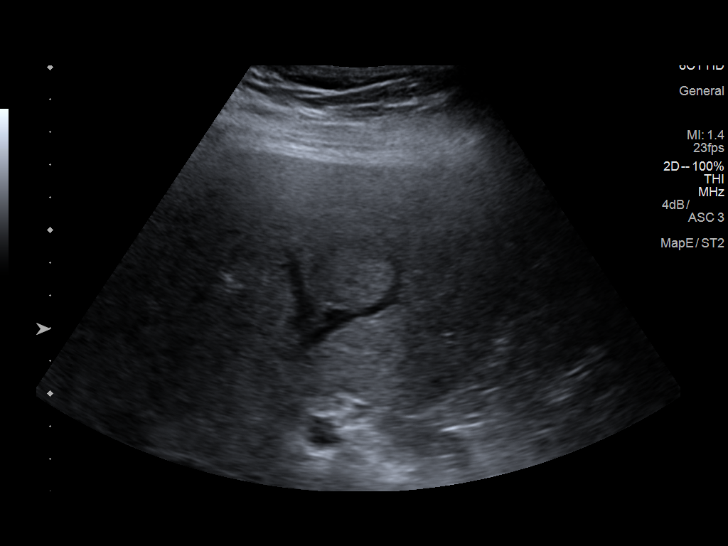
[im 3/11]
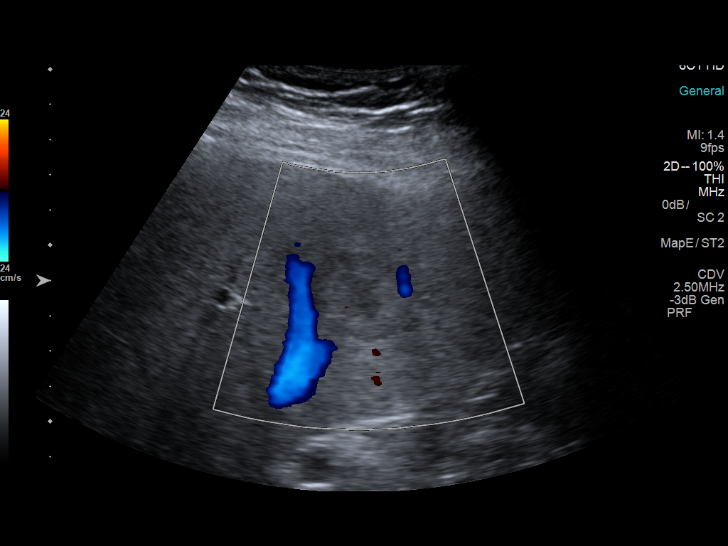
[im 4/11]
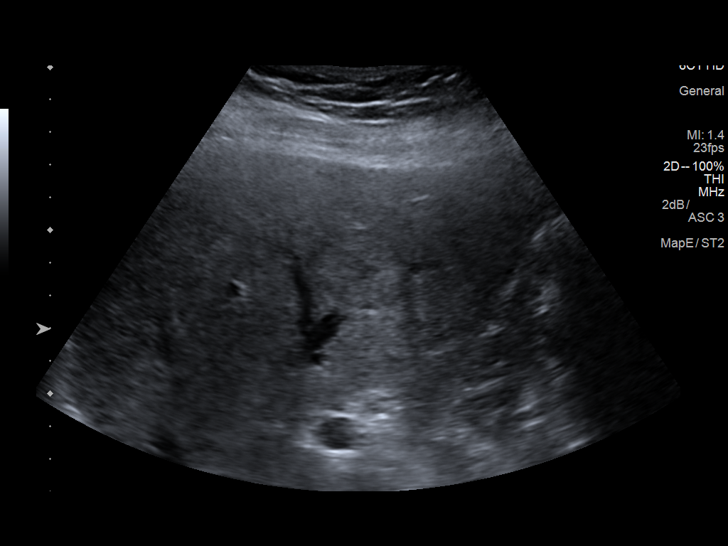
[im 5/11]
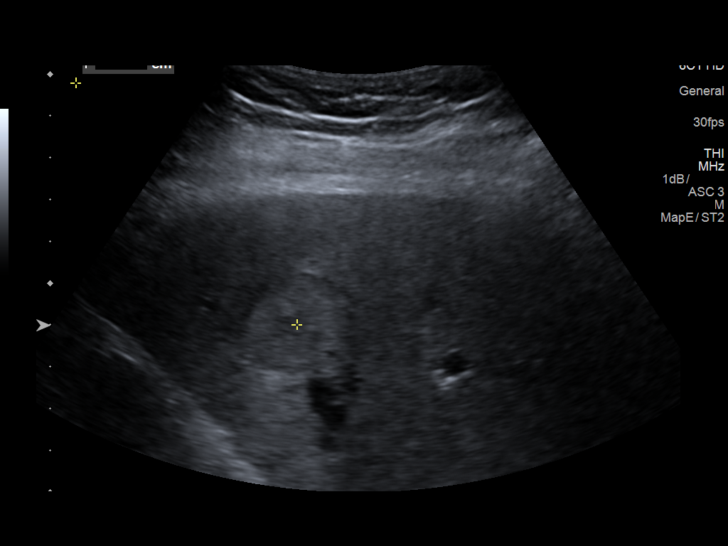
[im 6/11]
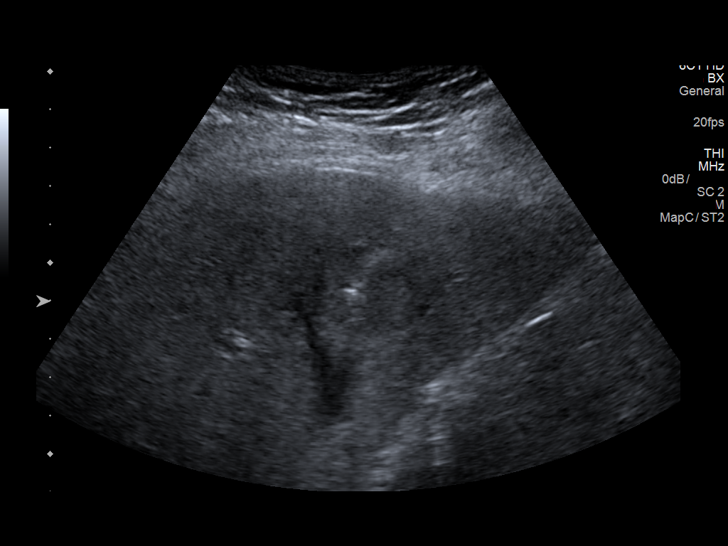
[im 7/11]
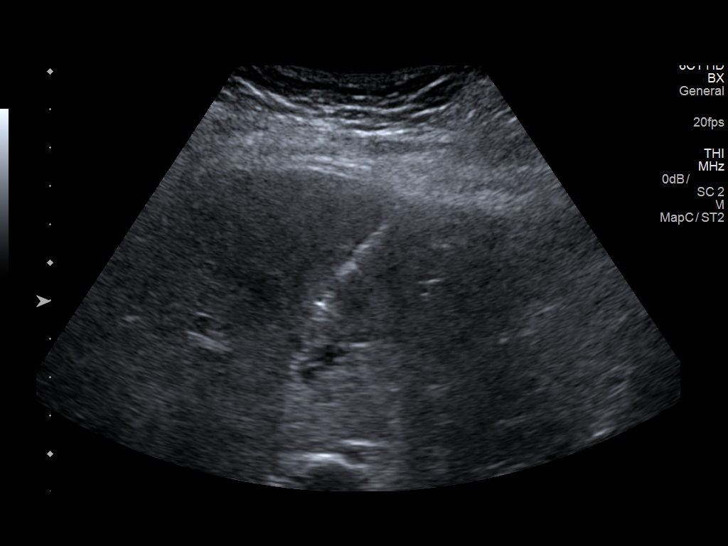
[im 8/11]
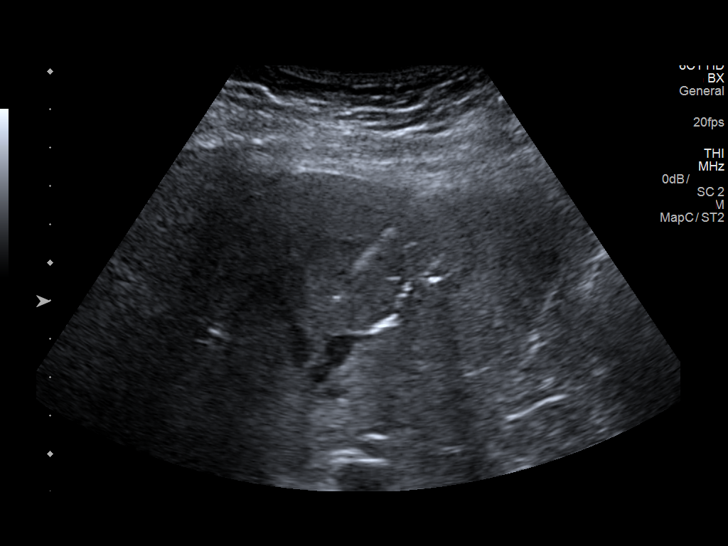
[im 9/11]
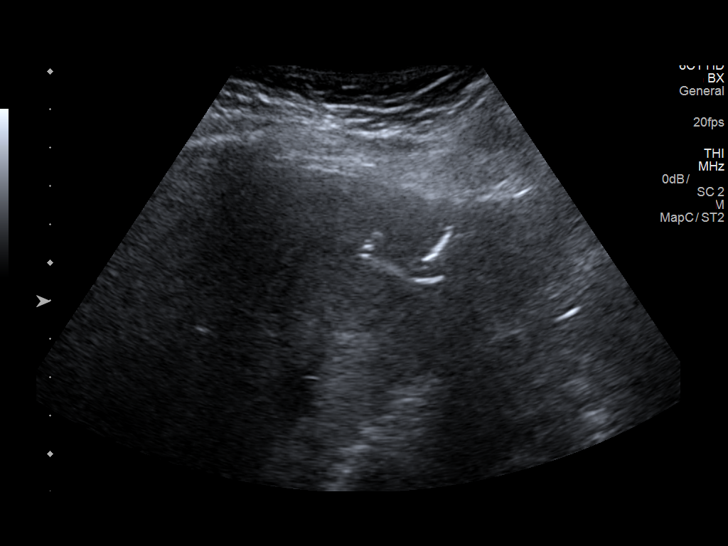
[im 10/11]
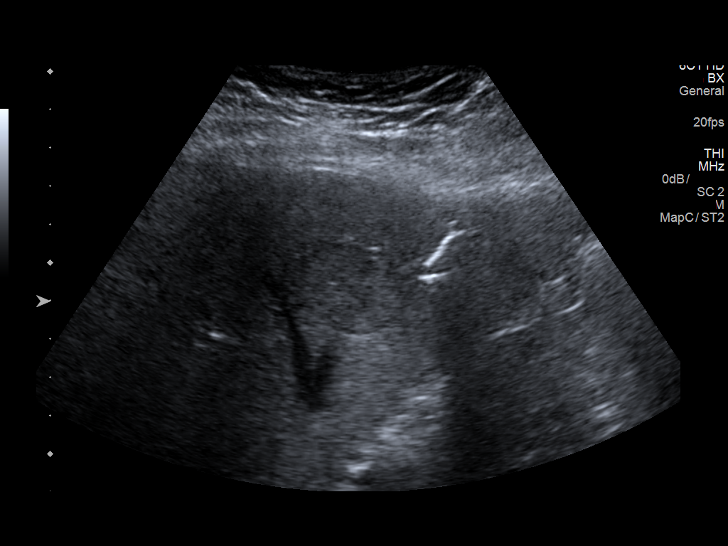
[im 11/11]
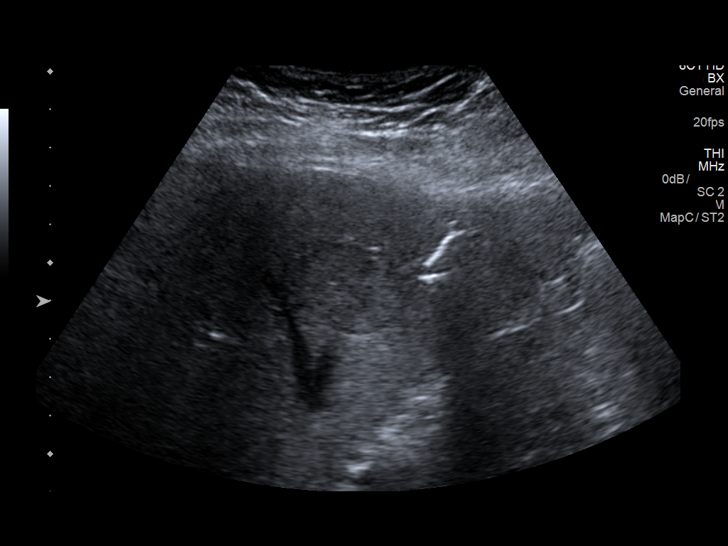

[11 of 11 positions shown; findings below may reference images not displayed]

EXAM:
ULTRASOUND-GUIDED LIVER LESION BIOPSY

MEDICATIONS:
None.

ANESTHESIA/SEDATION:
Moderate (conscious) sedation was employed during this procedure. A
total of Versed 1.5 mg and Fentanyl 75 mcg was administered
intravenously.

Moderate Sedation Time: 15 minutes. The patient's level of
consciousness and vital signs were monitored continuously by
radiology nursing throughout the procedure under my direct
supervision.

FLUOROSCOPY TIME:  None

COMPLICATIONS:
None immediate.

PROCEDURE:
Informed written consent was obtained from the patient after a
thorough discussion of the procedural risks, benefits and
alternatives. All questions were addressed. A timeout was performed
prior to the initiation of the procedure.

Ultrasound was used to identify a lesion in the anterior liver. The
right upper abdomen was prepped with chlorhexidine and sterile field
was created. Skin and soft tissues were anesthetized with 1%
lidocaine. 17 gauge coaxial needle was directed into the liver with
ultrasound guidance. Needle was positioned within the lesion. Total
of 3 core biopsies were obtained with an 18 gauge device. Specimens
placed in formalin. 17 gauge coaxial needle was removed without
complication. Bandage placed over the puncture site.

Ultrasound images were taken and saved for this procedure.
FINDINGS: Isoechoic round nodule in the anterior liver. This lesion measures
roughly 3 cm and corresponds with the suspicious lesion seen on the
recent CT in segment 4 of the liver. Needle position confirmed
within the lesion. No significant bleeding or hematoma formation
following the core biopsies.
IMPRESSION: Successful ultrasound-guided liver lesion biopsy.

## 2019-03-13 IMAGING — CT CT ABD-PELV W/ CM
2 of 5 series · 15 of 46 positions shown, 17 images · IV contrast (Isovue)
Comparison: Multiple exams, including 08/25/2017

CLINICAL DATA: Restaging metastatic prostate cancer with ongoing
chemotherapy. Metastatic disease to the liver and skeleton. Low back
pain and bilateral leg/hip pain.

EXAM:
CT ABDOMEN AND PELVIS WITH CONTRAST
TECHNIQUE: Multidetector CT imaging of the abdomen and pelvis was performed
using the standard protocol following bolus administration of
intravenous contrast.
CONTRAST:  100mL 3CAERS-OQQ IOPAMIDOL (3CAERS-OQQ) INJECTION 61%

[Series 2: axial st · axial · 0.81mm/px · z∈[+1063,+1468]mm · 12 of 97 slices shown, 14 images]
[im 8/97  soft-tissue]
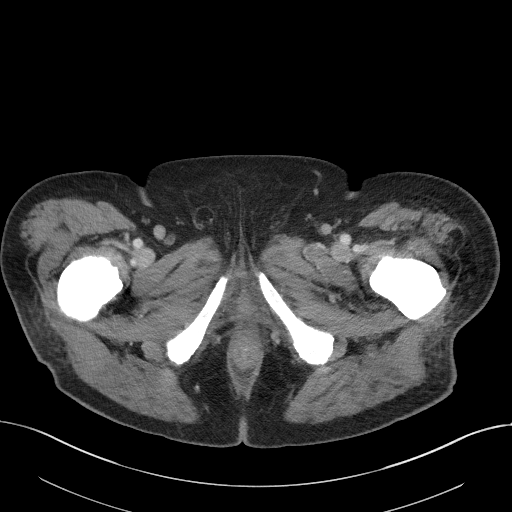
[im 8/97  bone]
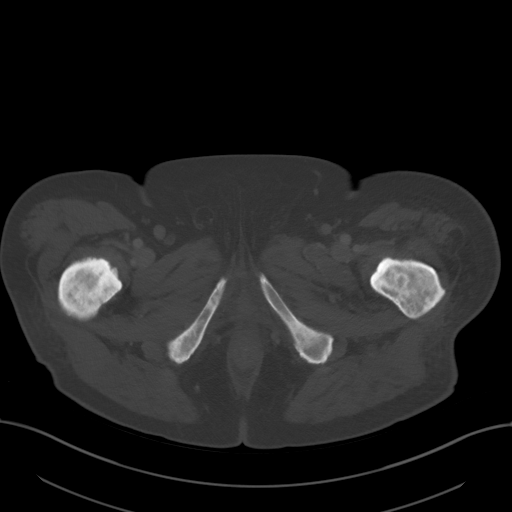
[im 15/97  soft-tissue]
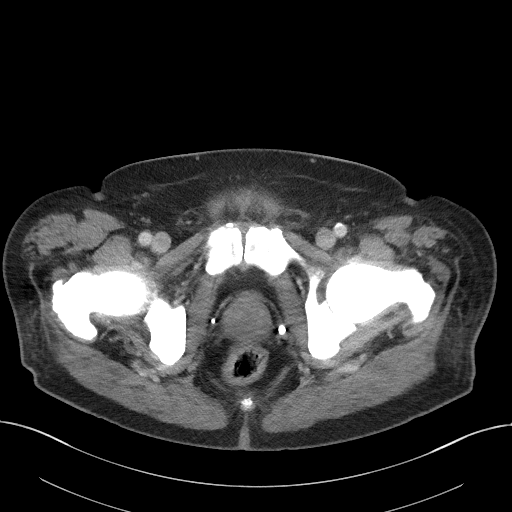
[im 23/97  soft-tissue]
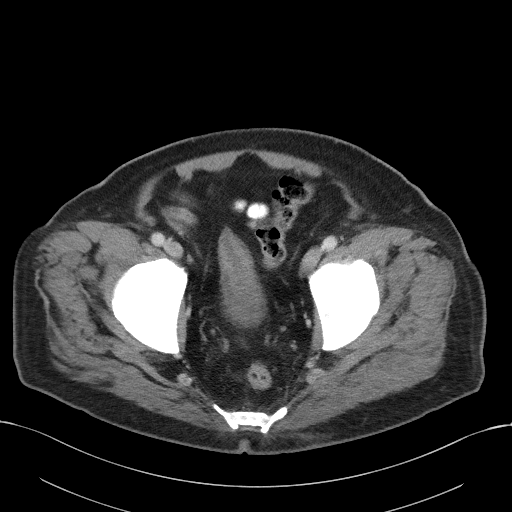
[im 30/97  soft-tissue]
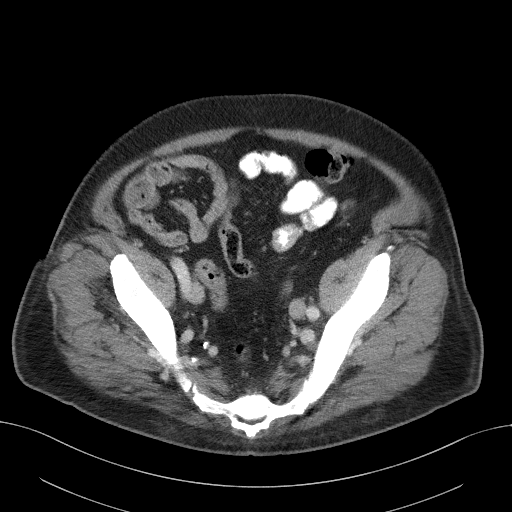
[im 37/97  soft-tissue]
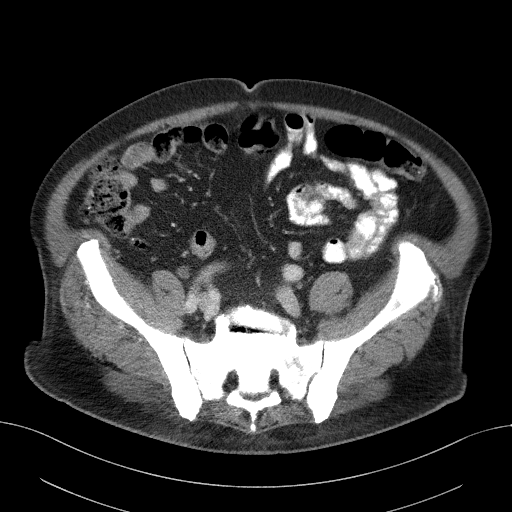
[im 45/97  soft-tissue]
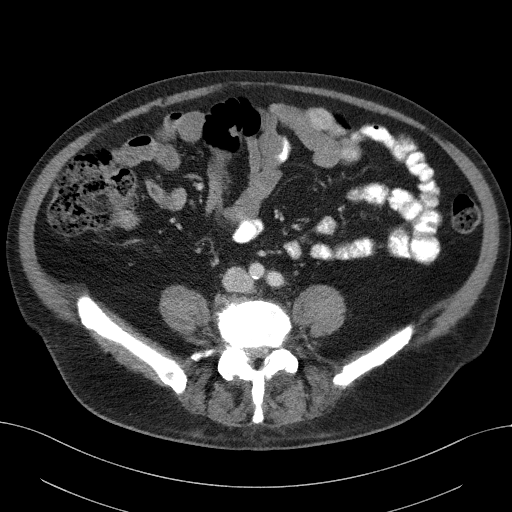
[im 52/97  soft-tissue]
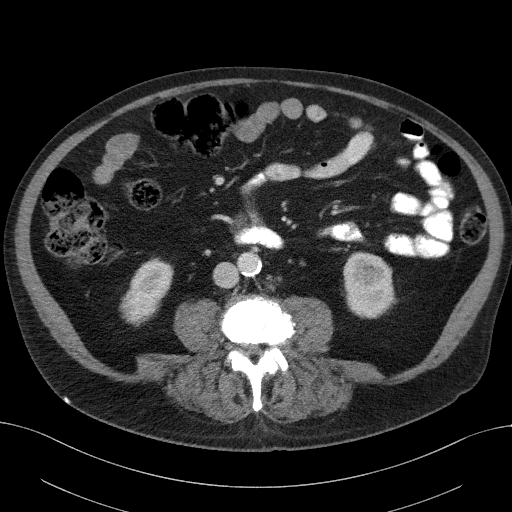
[im 60/97  soft-tissue]
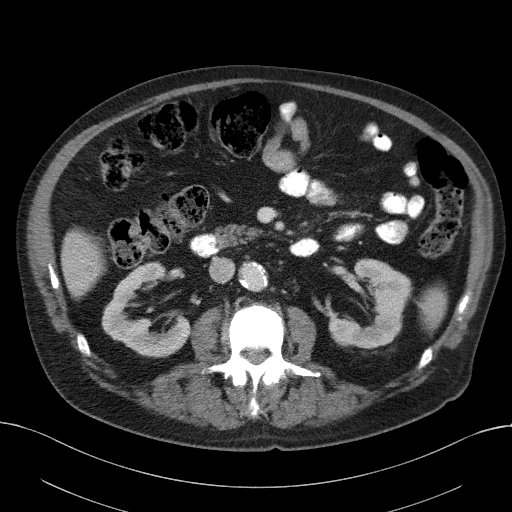
[im 67/97  soft-tissue]
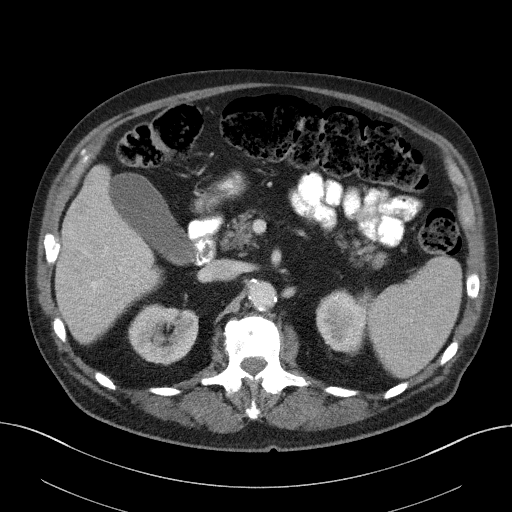
[im 67/97  bone]
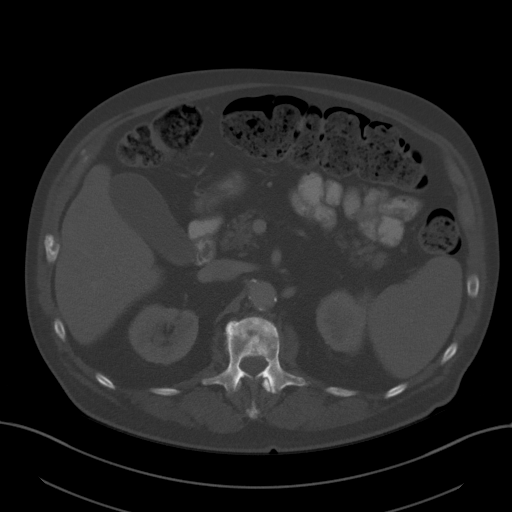
[im 74/97  soft-tissue]
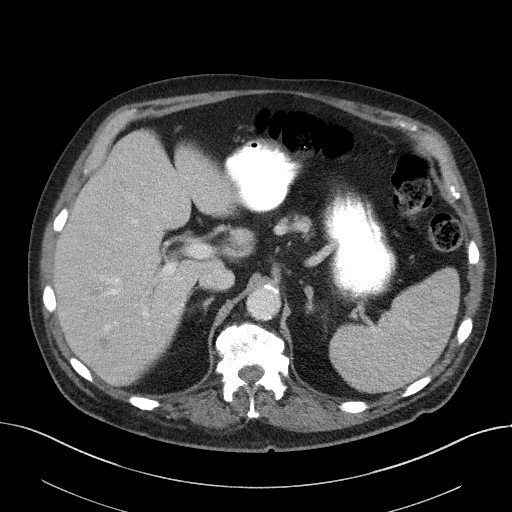
[im 82/97  soft-tissue]
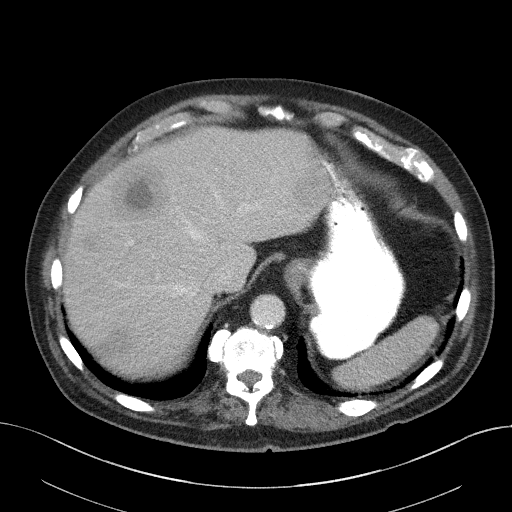
[im 89/97  soft-tissue]
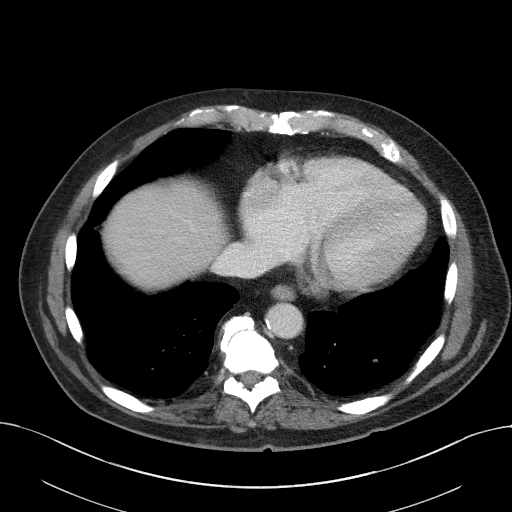

[Series 4: coronal st · coronal · 0.84mm/px · 3 of 109 slices shown]
[im 37/109  soft-tissue]
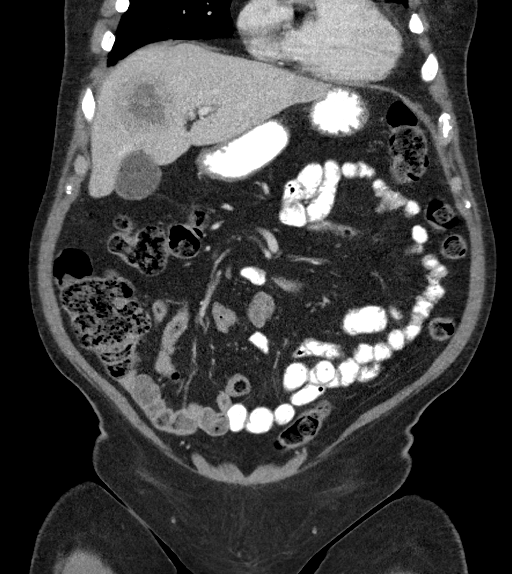
[im 49/109  soft-tissue]
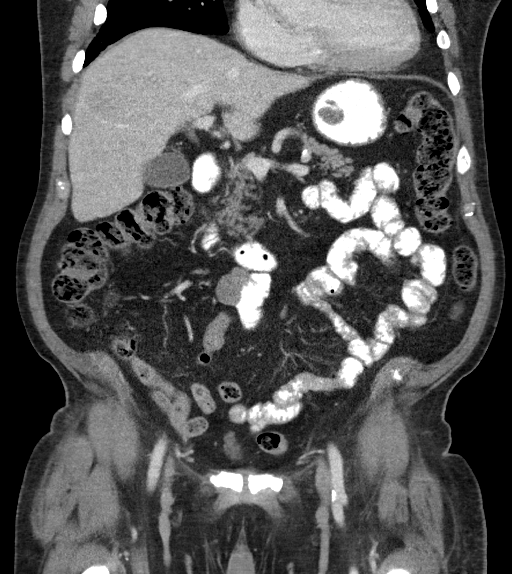
[im 61/109  soft-tissue]
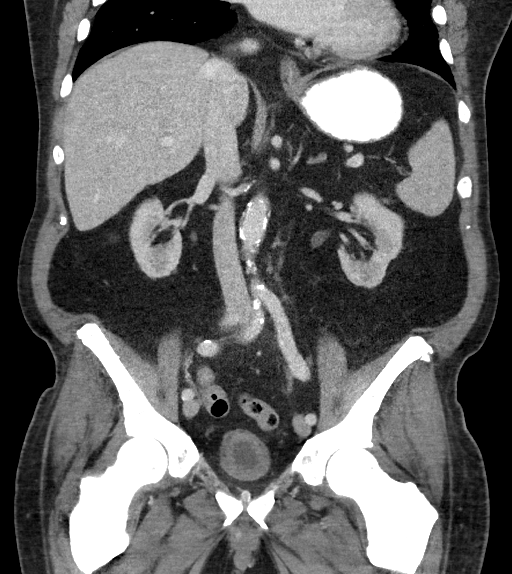

[15 of 46 positions shown; findings below may reference images not displayed]

FINDINGS: Lower chest: Mild cardiomegaly. Scarring anteriorly in the right
lower lobe. Atherosclerotic calcification of the descending thoracic
aorta.

There is abnormal hypodensity and expansion of right lower lobe
pulmonary arterial branches compatible with acute pulmonary embolus.
Right ventricular to left ventricular ratio 1.0.

Small amount of contrast in the distal esophagus.

Hepatobiliary: Increased central necrosis and an index 5.1 by 5.5 cm
lesion primarily in segment 4 of the liver on image [DATE], previously
5.2 by 5.5 cm. The other liver lesions appear stable from
08/25/2017. No definite new lesions, although some of the lesions
are isodense to the liver and thus difficult to discern.

Pancreas: Unremarkable

Spleen: Unremarkable

Adrenals/Urinary Tract: Stable right kidney upper pole fluid density
lesion favoring cyst. Small left mid kidney lesion posterolaterally
is stable and technically nonspecific due to small size. Adrenal
glands unremarkable.

Stomach/Bowel: Unremarkable

Vascular/Lymphatic: Aortoiliac atherosclerotic vascular disease. No
pathologic adenopathy in the abdomen/pelvis.

Reproductive: The prostate gland is not enlarged and has a stable
contour without asymmetry. Seminal vesicles do not appear
asymmetric.

Other: No supplemental non-categorized findings.

Musculoskeletal: Diffuse sclerosis of the skeleton compatible with
prior widespread osseous metastatic disease.

Bilateral pars defects at L5 with 8 mm of anterolisthesis at L5-S1.
Bilateral foraminal impingement at L5-S1. Lumbar spondylosis and
degenerative disc disease noted with additional impingement
suspected at L3-4 and possibly L2-3. Old healed left posterior rib
fractures.
IMPRESSION: 1. Acute pulmonary embolus in the visualized portion of the right
lower lobe. Positive for acute PE with CT evidence of right heart
strain (RV/LV Ratio = 1.0) consistent with at least submassive
(intermediate risk) PE. The presence of right heart strain has been
associated with an increased risk of morbidity and mortality. The
patient should be transported to the emergency room, preferably by
EMS. Treating personnel: Please activate Code PE by paging
[phone].
2. Similar appearance of scattered hepatic metastatic lesions.
Although unchanged in size, a dominant lesion primarily in segment 4
of the liver does demonstrate some increase in central necrosis
compared to previous.
3. Diffuse skeletal sclerosis compatible with prior widespread
osseous metastatic disease.
4. Other imaging findings of potential clinical significance: Aortic
Atherosclerosis ([id number]). Mild cardiomegaly. Pars defects at L5
with 8 mm anterolisthesis at L5-S1. Foraminal impingement at L5-S1
and L3-4, and possibly L2-3.

Critical Value/emergent results were called by telephone at the time
of interpretation on 10/06/2017 at [DATE] to Dr. Neco, who
verbally acknowledged these results.

## 2019-03-13 IMAGING — CT CT HEAD W/O CM
3 series · 15 of 47 positions shown, 18 images · non-contrast
Comparison: 10/20/2012

CLINICAL DATA: History prostate cancer on chemotherapy currently.
Pulmonary embolism today.

EXAM:
CT HEAD WITHOUT CONTRAST
TECHNIQUE: Contiguous axial images were obtained from the base of the skull
through the vertex without intravenous contrast.

[Series 2: head wo · axial · 0.43mm/px · z∈[+1753,+1883]mm · 9 of 32 slices shown, 12 images]
[im 3/32  brain]
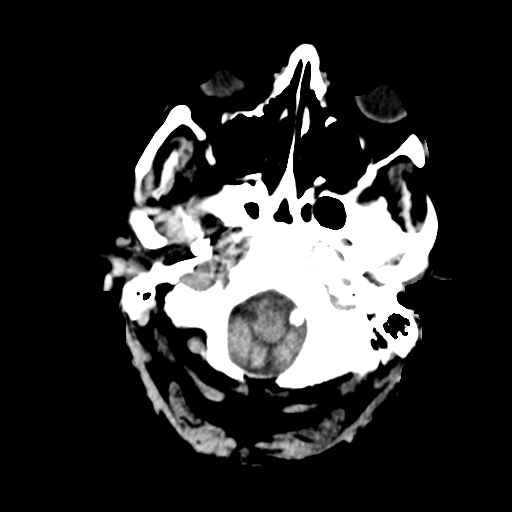
[im 3/32  bone]
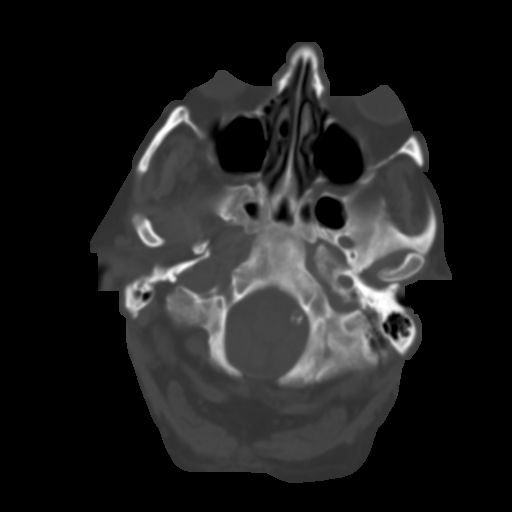
[im 6/32  brain]
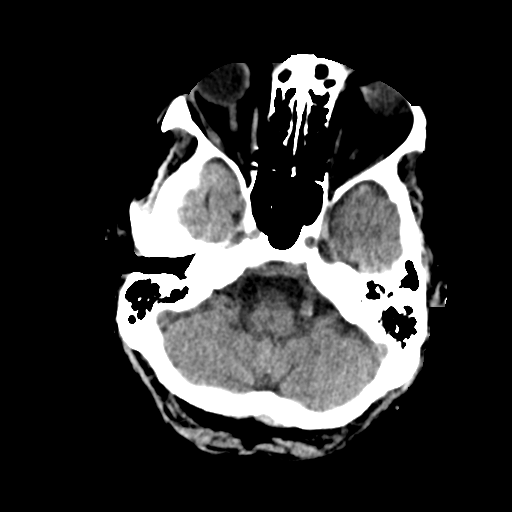
[im 9/32  brain]
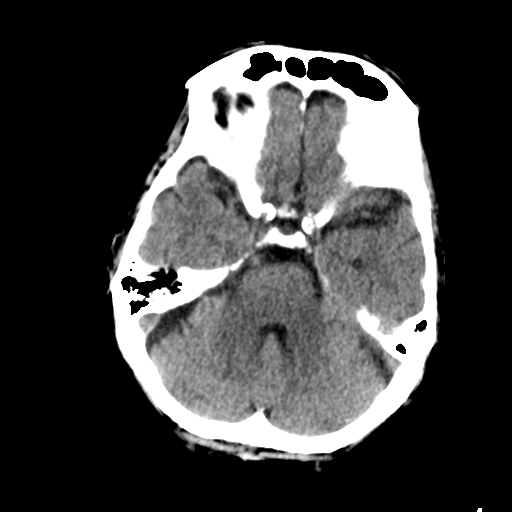
[im 12/32  brain]
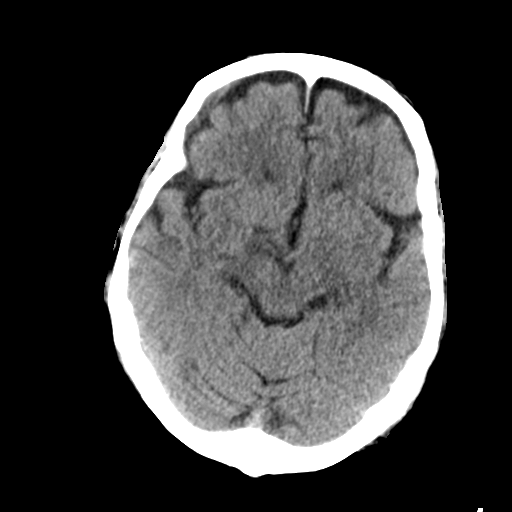
[im 17/32  brain]
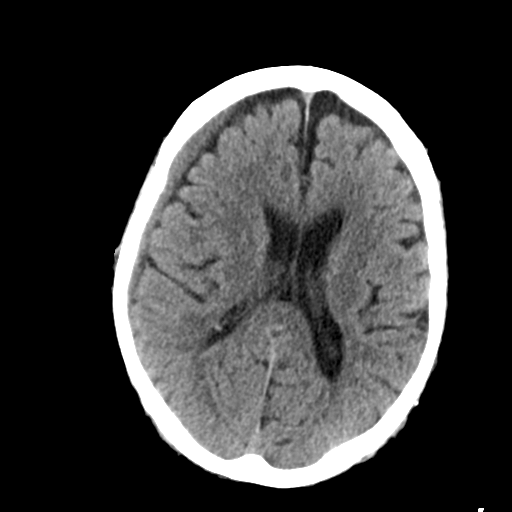
[im 17/32  bone]
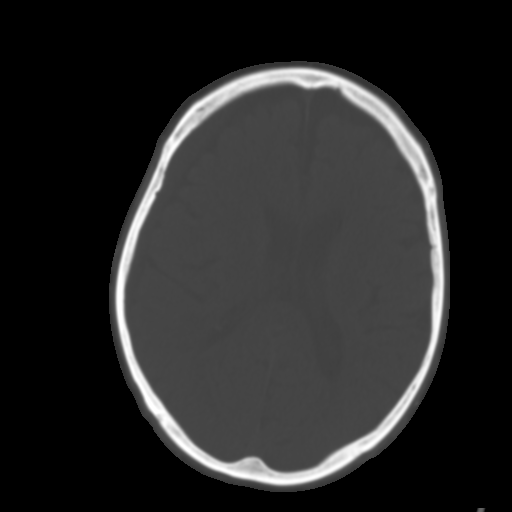
[im 20/32  brain]
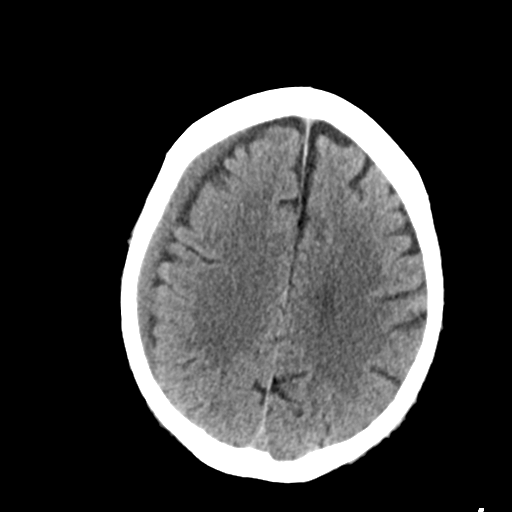
[im 23/32  brain]
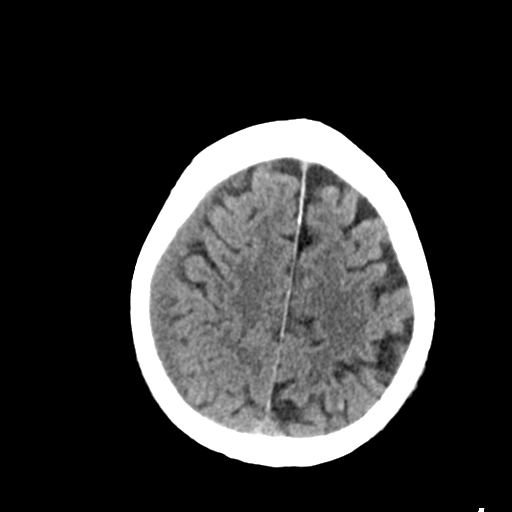
[im 26/32  brain]
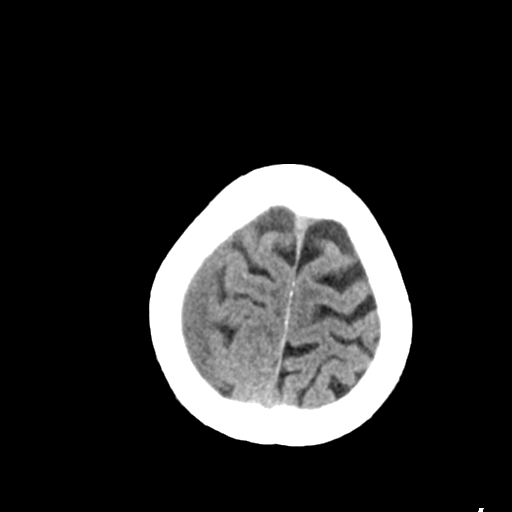
[im 29/32  brain]
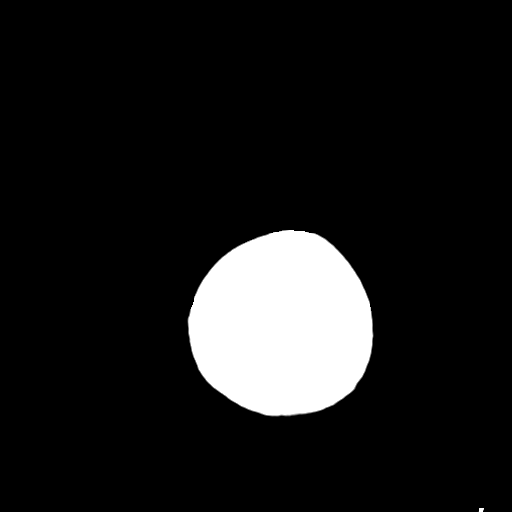
[im 29/32  bone]
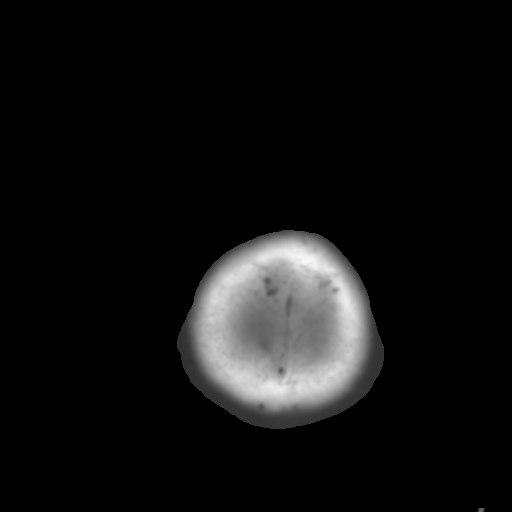

[Series 4: coronal soft tissue · coronal · 0.34mm/px · 3 of 66 slices shown]
[im 22/66  brain]
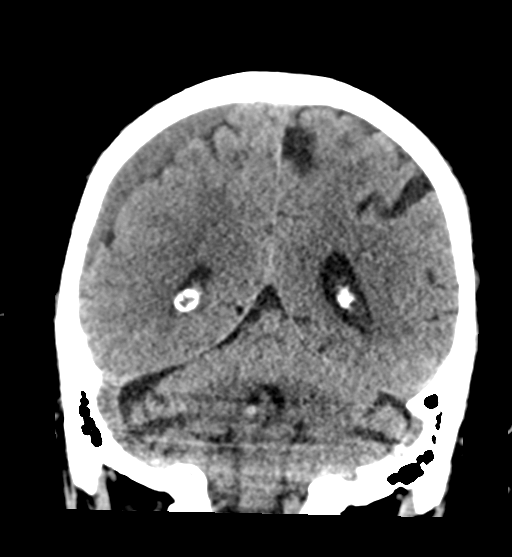
[im 29/66  brain]
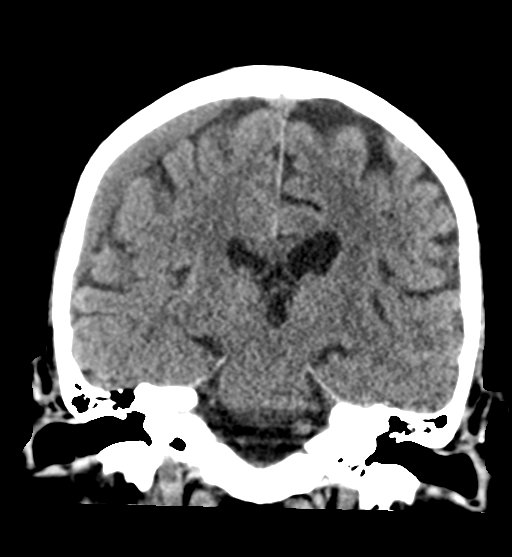
[im 37/66  brain]
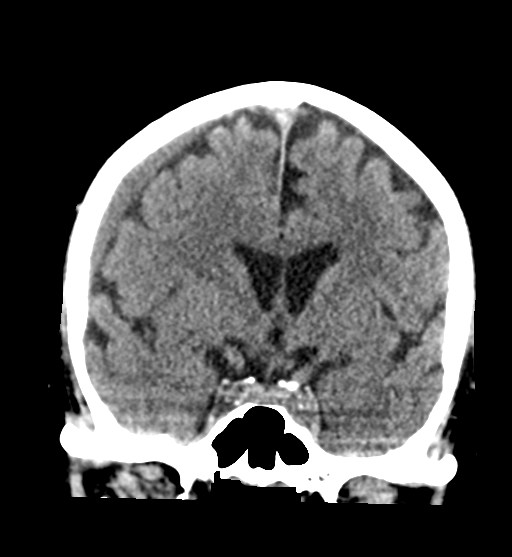

[Series 5: sagittal soft tissue · sagittal · 0.32mm/px · 3 of 56 slices shown]
[im 19/56  brain]
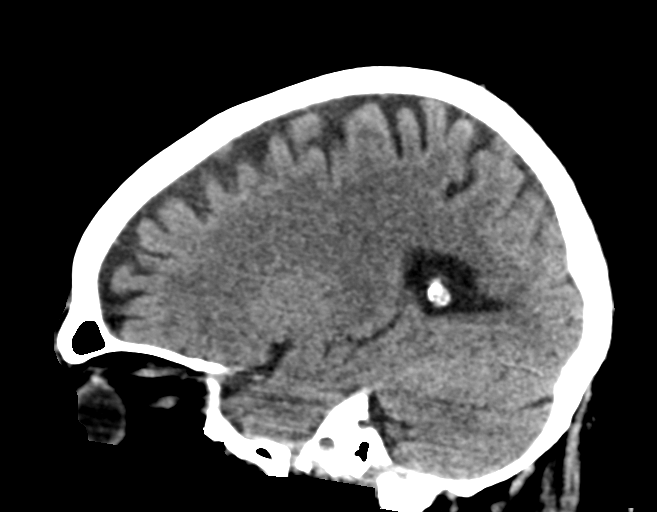
[im 28/56  brain]
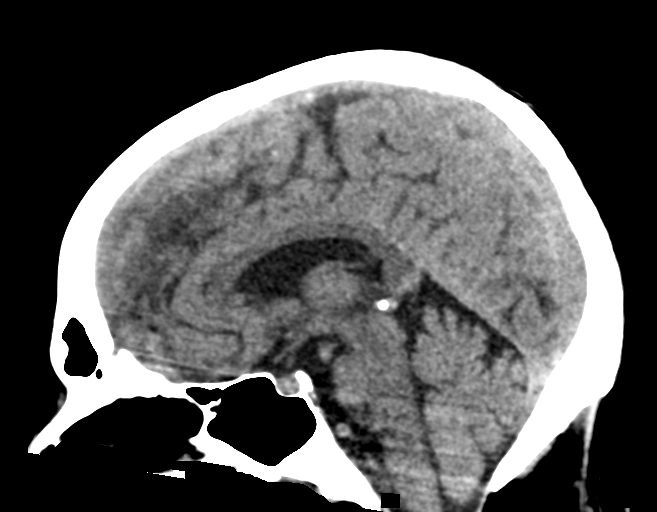
[im 37/56  brain]
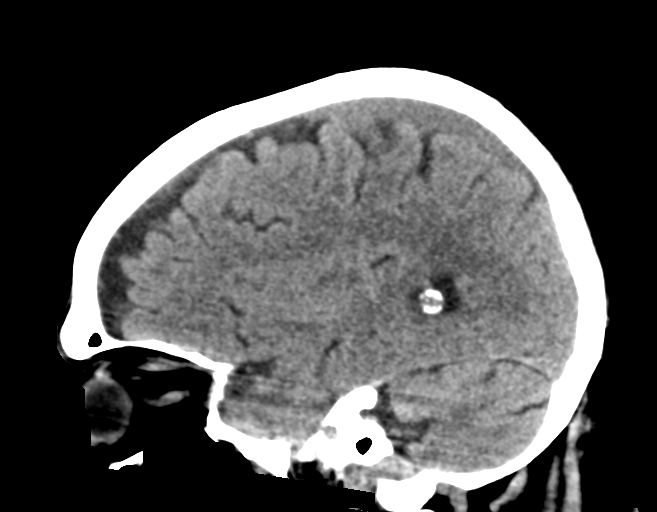

[15 of 47 positions shown; findings below may reference images not displayed]

FINDINGS: Brain: Ventricles and cisterns are within normal. There is a small
right convexity subdural hematoma likely subacute in nature
measuring 7 mm in thickness over the frontal region just above the
level of the lateral ventricles. Very subtle local mass effect and
no significant midline shift. No evidence of mass. No acute
infarction.

Vascular: No hyperdense vessel or unexpected calcification.

Skull: Normal. Negative for fracture or focal lesion.

Sinuses/Orbits: Orbits are within normal. There is opacification of
the floor the right maxillary sinus likely chronic inflammatory
change.

Other: None.
IMPRESSION: Small right convexity subdural hematoma likely subacute in nature
with minimal local mass effect, but no significant midline shift.
This measures 7 mm in thickness.

Critical Value/emergent results were called by telephone at the time
of interpretation on 10/06/2017 at [DATE] to Dr. BADARU GOEWAM , who
verbally acknowledged these results.

## 2019-07-21 IMAGING — US VENOUS DOPPLER ULTRASOUND OF BILATERAL LOWER EXTREMITIES
1 series · 13 of 24 positions shown · non-contrast
Comparison: None.

CLINICAL DATA: History of pulmonary embolus.



[Series 1: venous doppler ultrasound of bilateral lower extre · 0.08mm/px · 13 of 82 slices shown]
[im 1/82]
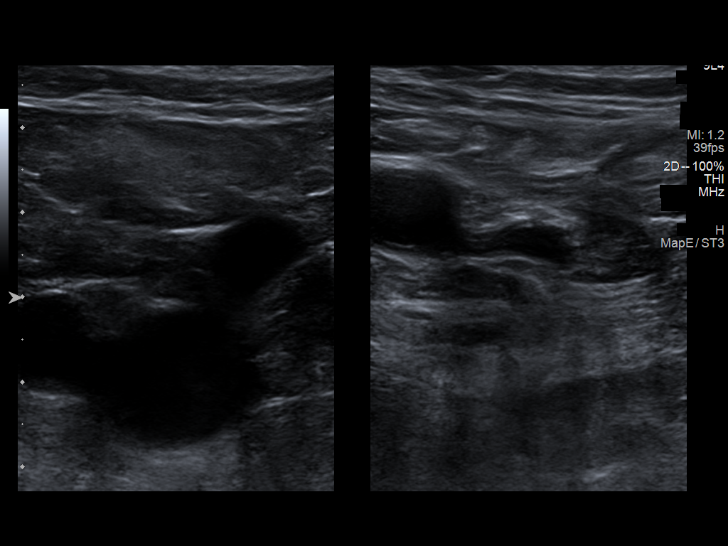
[im 8/82]
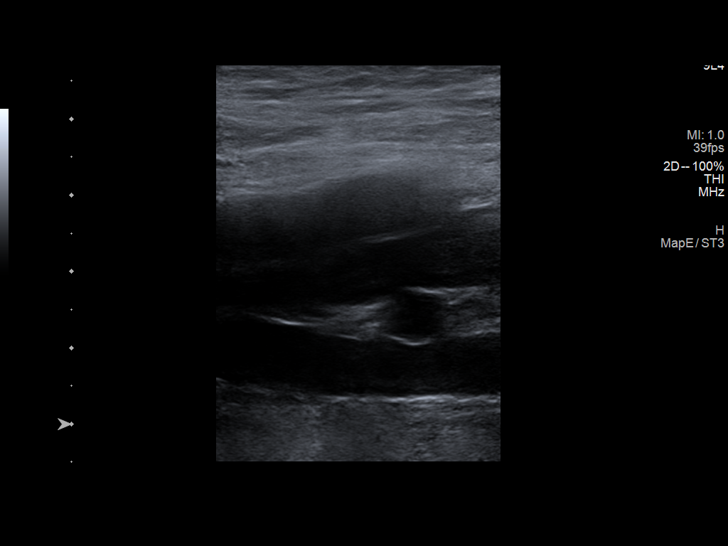
[im 15/82]
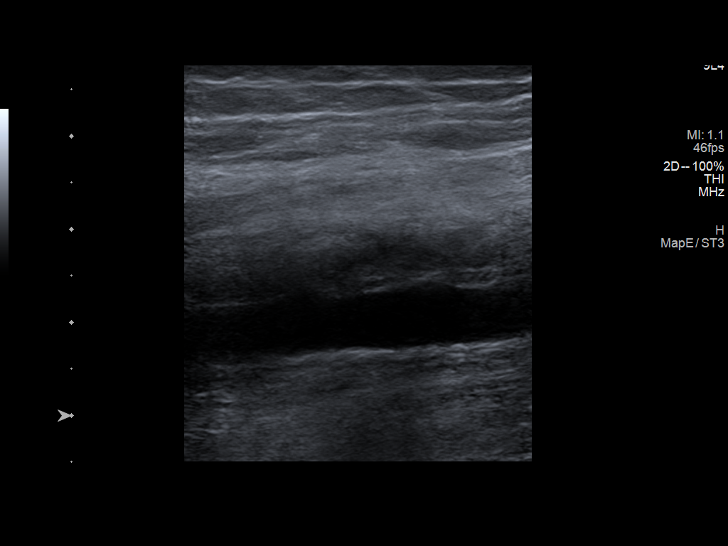
[im 22/82]
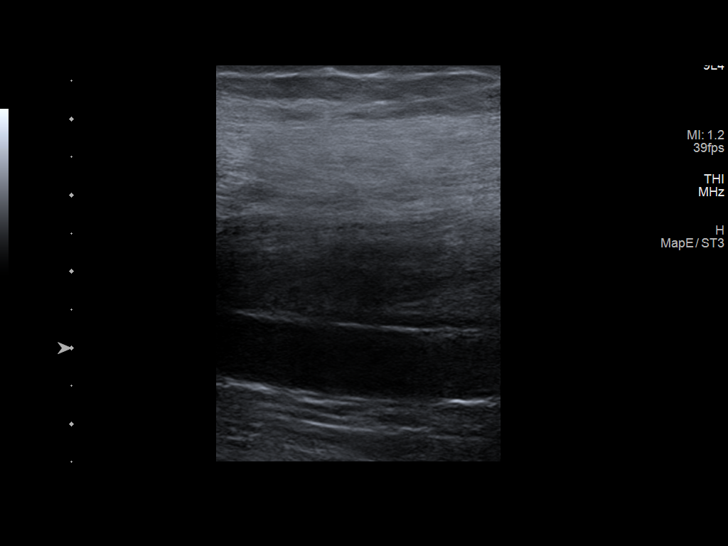
[im 29/82]
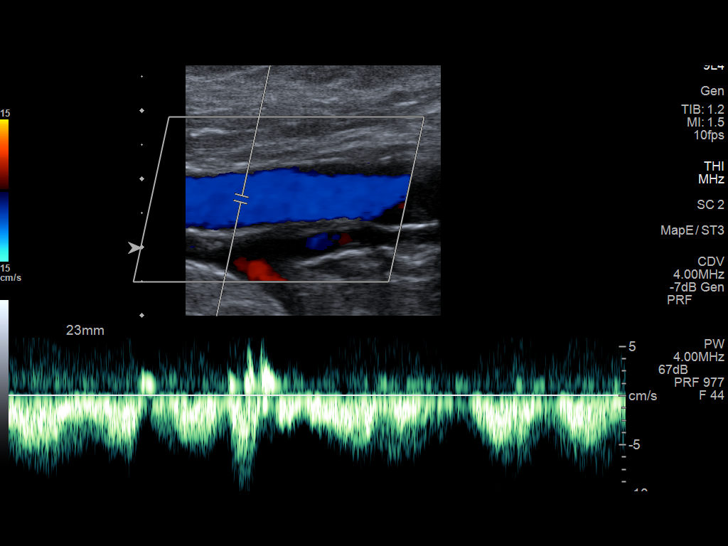
[im 36/82]
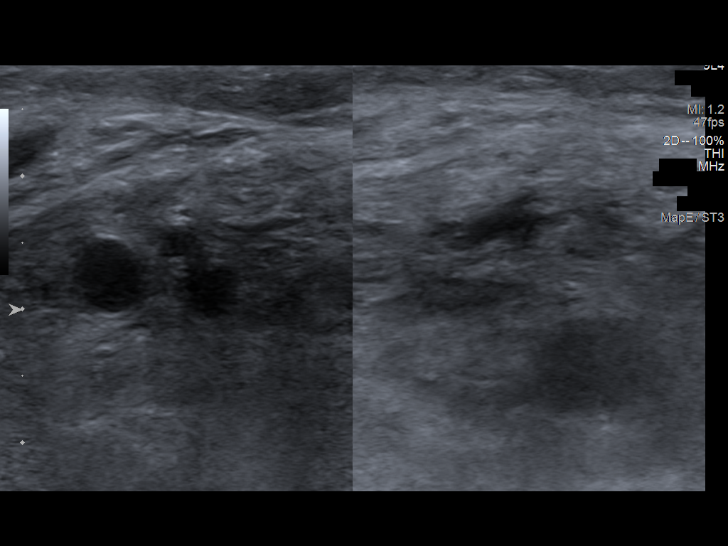
[im 43/82]
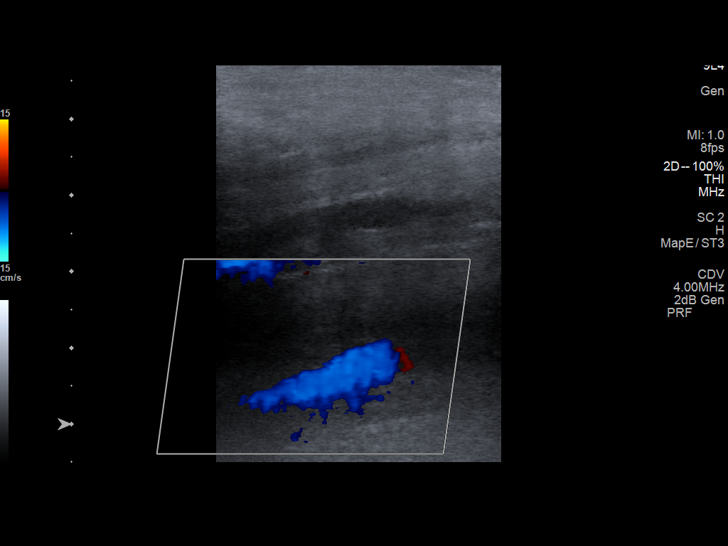
[im 46/82]
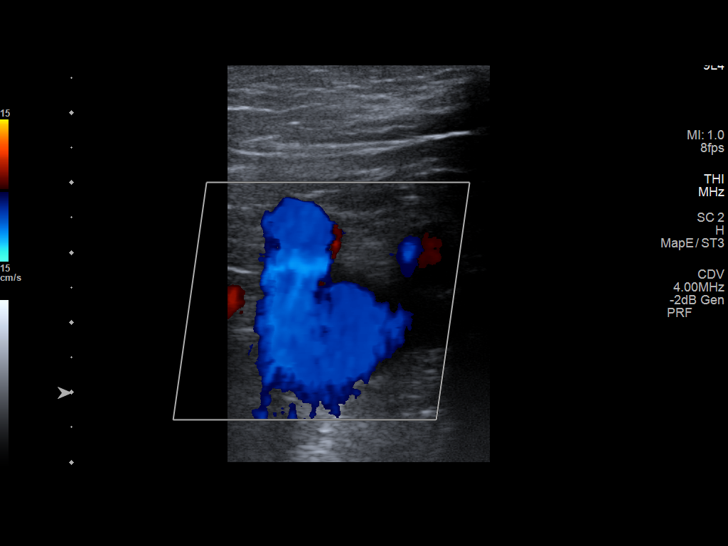
[im 53/82]
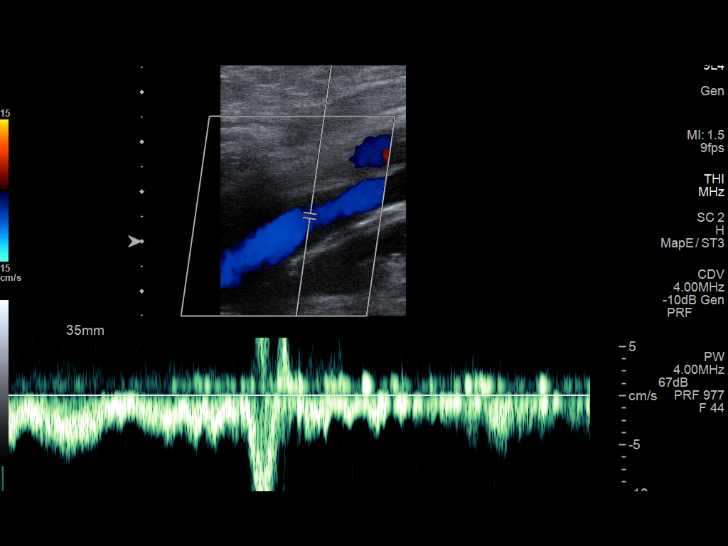
[im 60/82]
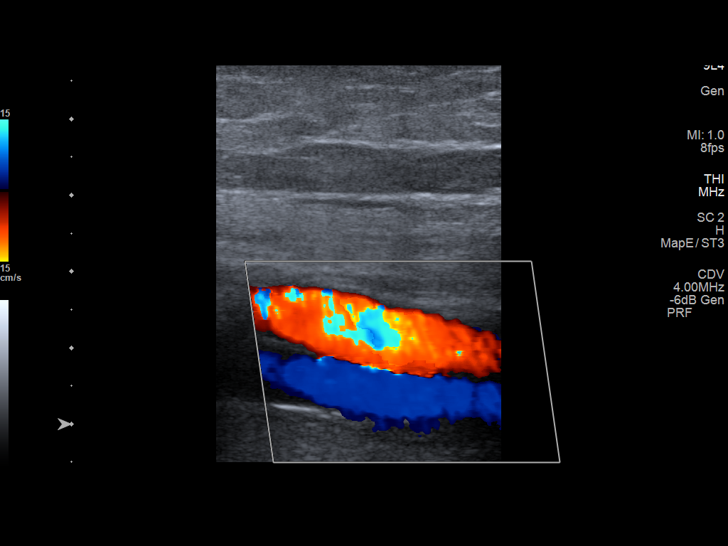
[im 67/82]
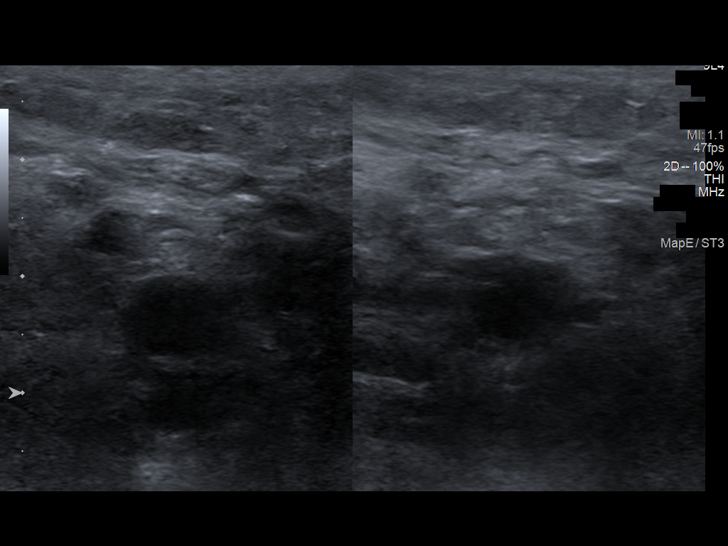
[im 74/82]
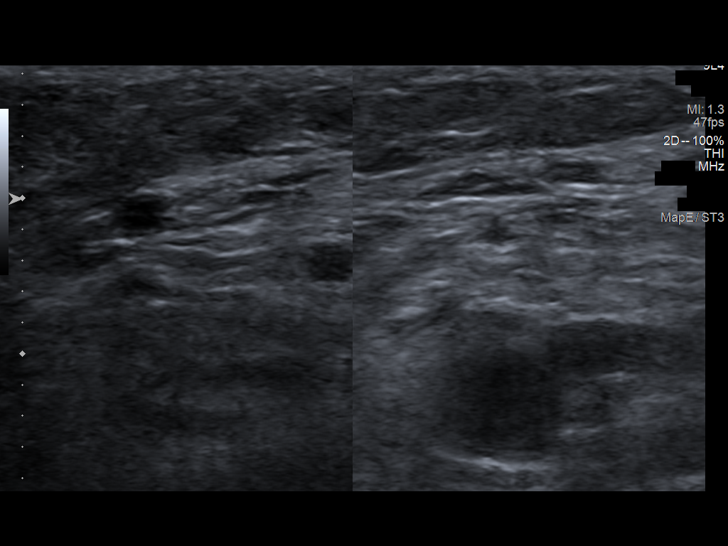
[im 82/82]
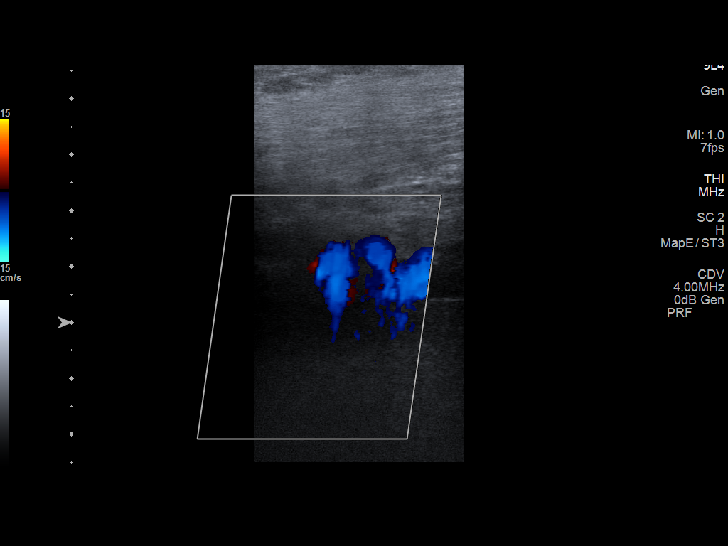

[13 of 24 positions shown; findings below may reference images not displayed]

FINDINGS: RIGHT LOWER EXTREMITY

Common Femoral Vein: No evidence of thrombus. Normal
compressibility, respiratory phasicity and response to augmentation.

Saphenofemoral Junction: No evidence of thrombus. Normal
compressibility and flow on color Doppler imaging.

Profunda Femoral Vein: No evidence of thrombus. Normal
compressibility and flow on color Doppler imaging.

Femoral Vein: No evidence of thrombus. Normal compressibility,
respiratory phasicity and response to augmentation.

Popliteal Vein: No evidence of thrombus. Normal compressibility,
respiratory phasicity and response to augmentation.

Calf Veins: No evidence of thrombus. Normal compressibility and flow
on color Doppler imaging.

Superficial Great Saphenous Vein: No evidence of thrombus. Normal
compressibility.

Venous Reflux:  None.

Other Findings:  None.

LEFT LOWER EXTREMITY

Common Femoral Vein: No evidence of thrombus. Normal
compressibility, respiratory phasicity and response to augmentation.

Saphenofemoral Junction: No evidence of thrombus. Normal
compressibility and flow on color Doppler imaging.

Profunda Femoral Vein: No evidence of thrombus. Normal
compressibility and flow on color Doppler imaging.

Femoral Vein: No evidence of thrombus. Normal compressibility,
respiratory phasicity and response to augmentation.

Popliteal Vein: No evidence of thrombus. Normal compressibility,
respiratory phasicity and response to augmentation.

Calf Veins: No evidence of thrombus. Normal compressibility and flow
on color Doppler imaging.

Superficial Great Saphenous Vein: No evidence of thrombus. Normal
compressibility.

Venous Reflux:  None.

Other Findings:  None.
IMPRESSION: No evidence of deep venous thrombosis.
